# Patient Record
Sex: Male | Born: 1940
Health system: Southern US, Community
[De-identification: ages and names within clinical notes are randomized; demographics above are authoritative.]

## PROBLEM LIST (undated history)

## (undated) DIAGNOSIS — E7800 Pure hypercholesterolemia, unspecified: Secondary | ICD-10-CM

## (undated) DIAGNOSIS — I10 Essential (primary) hypertension: Secondary | ICD-10-CM

## (undated) DIAGNOSIS — Z87442 Personal history of urinary calculi: Secondary | ICD-10-CM

## (undated) DIAGNOSIS — N183 Chronic kidney disease, stage 3 unspecified: Secondary | ICD-10-CM

## (undated) DIAGNOSIS — K5790 Diverticulosis of intestine, part unspecified, without perforation or abscess without bleeding: Secondary | ICD-10-CM

## (undated) DIAGNOSIS — J189 Pneumonia, unspecified organism: Secondary | ICD-10-CM

## (undated) DIAGNOSIS — R739 Hyperglycemia, unspecified: Secondary | ICD-10-CM

## (undated) DIAGNOSIS — G910 Communicating hydrocephalus: Secondary | ICD-10-CM

## (undated) DIAGNOSIS — G4730 Sleep apnea, unspecified: Secondary | ICD-10-CM

## (undated) DIAGNOSIS — R4182 Altered mental status, unspecified: Secondary | ICD-10-CM

## (undated) DIAGNOSIS — I480 Paroxysmal atrial fibrillation: Secondary | ICD-10-CM

## (undated) DIAGNOSIS — I509 Heart failure, unspecified: Secondary | ICD-10-CM

## (undated) HISTORY — DX: Chronic kidney disease, stage 3 (moderate): N18.3

## (undated) HISTORY — PX: EYE SURGERY: SHX253

## (undated) HISTORY — DX: Pure hypercholesterolemia, unspecified: E78.00

## (undated) HISTORY — DX: Hyperglycemia, unspecified: R73.9

## (undated) HISTORY — DX: Personal history of urinary calculi: Z87.442

## (undated) HISTORY — PX: TONSILLECTOMY: SUR1361

## (undated) HISTORY — DX: Chronic kidney disease, stage 3 unspecified: N18.30

## (undated) HISTORY — DX: Heart failure, unspecified: I50.9

## (undated) HISTORY — DX: Diverticulosis of intestine, part unspecified, without perforation or abscess without bleeding: K57.90

## (undated) HISTORY — DX: Communicating hydrocephalus: G91.0

## (undated) HISTORY — DX: Paroxysmal atrial fibrillation: I48.0

## (undated) HISTORY — DX: Essential (primary) hypertension: I10

## (undated) HISTORY — DX: Altered mental status, unspecified: R41.82

---

## 1979-11-25 HISTORY — PX: SHOULDER SURGERY: SHX246

## 2007-07-02 ENCOUNTER — Ambulatory Visit: Payer: Self-pay | Admitting: Cardiology

## 2007-07-06 ENCOUNTER — Ambulatory Visit: Payer: Self-pay | Admitting: Cardiology

## 2007-07-06 ENCOUNTER — Inpatient Hospital Stay (HOSPITAL_BASED_OUTPATIENT_CLINIC_OR_DEPARTMENT_OTHER): Admission: RE | Admit: 2007-07-06 | Discharge: 2007-07-06 | Payer: Self-pay | Admitting: Cardiology

## 2007-07-14 ENCOUNTER — Ambulatory Visit: Payer: Self-pay | Admitting: Cardiology

## 2007-08-10 ENCOUNTER — Encounter: Payer: Self-pay | Admitting: Cardiology

## 2007-08-10 ENCOUNTER — Ambulatory Visit: Payer: Self-pay

## 2007-08-10 ENCOUNTER — Ambulatory Visit: Payer: Self-pay | Admitting: Cardiology

## 2007-08-12 ENCOUNTER — Ambulatory Visit: Payer: Self-pay | Admitting: Internal Medicine

## 2007-09-20 ENCOUNTER — Ambulatory Visit: Payer: Self-pay | Admitting: Cardiology

## 2007-11-04 ENCOUNTER — Ambulatory Visit: Payer: Self-pay | Admitting: Cardiology

## 2007-11-04 ENCOUNTER — Ambulatory Visit: Payer: Self-pay

## 2008-01-19 ENCOUNTER — Ambulatory Visit: Payer: Self-pay | Admitting: Cardiology

## 2008-01-31 ENCOUNTER — Ambulatory Visit: Payer: Self-pay | Admitting: Internal Medicine

## 2008-01-31 ENCOUNTER — Ambulatory Visit: Payer: Self-pay

## 2008-01-31 ENCOUNTER — Encounter: Payer: Self-pay | Admitting: Cardiology

## 2008-06-20 ENCOUNTER — Ambulatory Visit: Payer: Self-pay | Admitting: Cardiology

## 2008-11-30 ENCOUNTER — Ambulatory Visit: Payer: Self-pay | Admitting: Cardiology

## 2009-02-23 DIAGNOSIS — E7800 Pure hypercholesterolemia, unspecified: Secondary | ICD-10-CM

## 2009-02-23 DIAGNOSIS — I428 Other cardiomyopathies: Secondary | ICD-10-CM

## 2009-02-23 DIAGNOSIS — Z8679 Personal history of other diseases of the circulatory system: Secondary | ICD-10-CM | POA: Insufficient documentation

## 2009-02-23 DIAGNOSIS — I509 Heart failure, unspecified: Secondary | ICD-10-CM | POA: Insufficient documentation

## 2009-02-27 ENCOUNTER — Encounter: Payer: Self-pay | Admitting: Cardiology

## 2009-02-28 ENCOUNTER — Ambulatory Visit: Payer: Self-pay | Admitting: Cardiology

## 2009-02-28 ENCOUNTER — Encounter: Payer: Self-pay | Admitting: Cardiology

## 2009-04-09 ENCOUNTER — Ambulatory Visit: Payer: Self-pay | Admitting: Cardiology

## 2009-08-07 ENCOUNTER — Ambulatory Visit: Payer: Self-pay | Admitting: Cardiology

## 2009-12-20 ENCOUNTER — Encounter: Payer: Self-pay | Admitting: Cardiology

## 2010-08-22 ENCOUNTER — Ambulatory Visit: Payer: Self-pay | Admitting: Cardiology

## 2010-08-22 ENCOUNTER — Ambulatory Visit: Payer: Self-pay

## 2010-08-22 ENCOUNTER — Encounter: Payer: Self-pay | Admitting: Cardiology

## 2010-08-22 ENCOUNTER — Ambulatory Visit (HOSPITAL_COMMUNITY): Admission: RE | Admit: 2010-08-22 | Discharge: 2010-08-22 | Payer: Self-pay | Admitting: Cardiology

## 2010-11-24 LAB — HM DIABETES EYE EXAM

## 2010-12-24 NOTE — Assessment & Plan Note (Signed)
Summary: ROV   Visit Type:  1 year follow up Primary Furkan Keenum:  kinlaw  CC:  follow up echo done today.  History of Present Illness: Reviewed with patient about echo.  Of note, he does not snore unless he gains alot of weight.  Has a large gall stone, but they have recommended a medical approach.  No chest pain.  Weight has been up and down.    Current Medications (verified): 1)  Simvastatin 40 Mg Tabs (Simvastatin) .... Take One Tablet By Mouth Daily At Bedtime 2)  Multivitamins  Tabs (Multiple Vitamin) .... Take 1 Tablet By Mouth Once A Day 3)  Vitamin E 400 Unit Caps (Vitamin E) .... Take 1 Capsule By Mouth Once A Day 4)  Vitamin C 500 Mg Tabs (Ascorbic Acid) .... Take 1 Tablet By Mouth Once A Day 5)  Bilberry Extract 1.000mg  .... Once Daily 6)  Vitamin A&d Iu .... Once Daily 7)  Carvedilol 25 Mg Tabs (Carvedilol) .... Take One Tablet By Mouth Twice A Day 8)  Januvia 100 Mg Tabs (Sitagliptin Phosphate) .... 1/2 Tablet By Mouth Daily 9)  Glimepiride 1 Mg Tabs (Glimepiride) .... Take 1 Tablet By Mouth Two Times A Day 10)  Trilipix 135 Mg Cpdr (Choline Fenofibrate) .... Take 1 Capsule By Mouth Once A Day  Allergies: 1)  ! Penicillin 2)  ! Asa  Vital Signs:  Patient profile:   70 year old male Height:      71 inches Weight:      253 pounds BMI:     35.41 Pulse rate:   91 / minute Pulse rhythm:   regular Resp:     18 per minute BP sitting:   136 / 80  (left arm) Cuff size:   large  Vitals Entered By: Sidney Ace (August 22, 2010 2:19 PM)  Physical Exam  General:  Well developed, well nourished, in no acute distress.  Moderately obese at baseline. Head:  normocephalic and atraumatic Eyes:  PERRLA/EOM intact; conjunctiva and lids normal. Lungs:  Clear bilaterally to auscultation and percussion. Heart:  PMI slightly displaced.  Normal S  without murmur.  Pos S4.  Paradoxical splitting of S2.   Abdomen:  Bowel sounds positive; abdomen soft and non-tender without masses,  organomegaly, or hernias noted. No hepatosplenomegaly.  No RUQ tenderness at present.  Msk:  Back normal, normal gait. Muscle strength and tone normal. Pulses:  pulses normal in all 4 extremities Extremities:  No clubbing or cyanosis. Neurologic:  Alert and oriented x 3.   EKG  Procedure date:  08/22/2010  Findings:      NSR.  LBBB.   Echocardiogram  Procedure date:  08/22/2010  Findings:      Left ventricle: The cavity size was mildly dilated. Wall thickness     was increased in a pattern of mild LVH. Systolic function was     severely reduced. The estimated ejection fraction was in the range     of 20% to 25%. There is akinesis of the anteroseptal myocardium.     There is akinesis of the apical myocardium. There is akinesis of the     inferior myocardium. Doppler parameters are consistent with high     ventricular filling pressure.     Impressions:            - Possible apical thrombus.  Impression & Recommendations:  Problem # 1:  CARDIOMYOPATHY (ICD-425.4) Reviewed in detail with patient.  His functional status is excellent.  I also reviewed  with Dr. Stanford Breed his echo study.  His LV is clearly reduced.  No apical mural thrombus is suggested by 2D at baseline.  ONly with contrast, in one view, is there even a possible suggestion of this, and Dr. Stanford Breed thought it also could be trabeculation causing the one view borderline abnormality.  Would not favor anticoagulation at present.  I think it would be prudent to consider adding an ACE or ARB, and I will discuss this with him when I call him regarding his echo study.  I would also like to review with Dr. Lovena Le discussions regarding NICM and ICD.   His updated medication list for this problem includes:    Carvedilol 25 Mg Tabs (Carvedilol) .Marland Kitchen... Take one tablet by mouth twice a day  Orders: EKG w/ Interpretation (93000)  Problem # 2:  HYPERTENSION, HX OF (ICD-V12.50) Controlled, although I would like to add an ACE  inhibitor.  Patient previously had some issues relative to this.  Orders: EKG w/ Interpretation (93000)  Problem # 3:  DIABETES MELLITUS (ICD-250.00) currently under close management by Dr. Ardeen Jourdain.  His updated medication list for this problem includes:    Januvia 100 Mg Tabs (Sitagliptin phosphate) .Marland Kitchen... 1/2 tablet by mouth daily    Glimepiride 1 Mg Tabs (Glimepiride) .Marland Kitchen... Take 1 tablet by mouth two times a day  Patient Instructions: 1)  Your physician recommends that you continue on your current medications as directed. Please refer to the Current Medication list given to you today. 2)  Your physician wants you to follow-up in:  6 MONTHS.  You will receive a reminder letter in the mail two months in advance. If you don't receive a letter, please call our office to schedule the follow-up appointment.  Appended Document: ROV Chart reviewed.  Admitted to [facility] with dehydration on ARB  (Avapro), and Cr was elevated.  Eventually stopped.  Could consider retry, but am reluctant.  Will cotinue to follow.  TS

## 2010-12-24 NOTE — Letter (Signed)
Summary: [facility]  [facility] Note   Imported By: Sallee Provencal 05/14/2010 13:55:08  _____________________________________________________________________  External Attachment:    Type:   Image     Comment:   External Document

## 2011-03-25 ENCOUNTER — Encounter: Payer: Self-pay | Admitting: Cardiology

## 2011-03-26 ENCOUNTER — Ambulatory Visit (INDEPENDENT_AMBULATORY_CARE_PROVIDER_SITE_OTHER): Payer: Medicare Other | Admitting: Cardiology

## 2011-03-26 ENCOUNTER — Encounter: Payer: Self-pay | Admitting: Cardiology

## 2011-03-26 DIAGNOSIS — E7800 Pure hypercholesterolemia, unspecified: Secondary | ICD-10-CM

## 2011-03-26 DIAGNOSIS — I428 Other cardiomyopathies: Secondary | ICD-10-CM

## 2011-03-26 MED ORDER — CARVEDILOL 25 MG PO TABS: 25.0000 mg | ORAL_TABLET | Freq: Two times a day (BID) | ORAL | Status: DC

## 2011-03-26 NOTE — Patient Instructions (Addendum)
Your physician recommends that you schedule a follow-up appointment in: 6 months with Dr. Lia Foyer  Your physician has requested that you have an echocardiogram. Echocardiography is a painless test that uses sound waves to create images of your heart. It provides your doctor with information about the size and shape of your heart and how well your heart's chambers and valves are working. This procedure takes approximately one hour. There are no restrictions for this procedure. To be done in 6 months

## 2011-04-03 NOTE — Assessment & Plan Note (Signed)
Has reduced overall LV function.  At the time of his last echo, his EF was 20-25%.  He had definity done and had trabeculation versus apical thrombus, but I reveiwed with Dr. Stanford Breed in great detail, and there was no clear  evidence of LV mural thrombus seen.  Of note, I spoke with EP as well  Caryl Comes).  He previously was turned down for ICD because of his functional status.  We discussed various options and approaches.  One would be to consider a CPX test to truly test his "asymptomatic" status. That would determine eligibility.    We also noted his issue with regard to meds.  He was taken off of ARBs in the past.  He had diahreaa, became dehydrated, and developed what was thought to be ARF related to this.  He has remained stable on these meds.    We will repeat echo in the fall and discuss with the patient options regarding ARB and CPX testing.

## 2011-04-03 NOTE — Progress Notes (Signed)
HPI:  Mr. Callis is in for follow up.  He continues to not have any significant symptoms despite his reduced EF.  He walks without difficulty.  He feels great.  Cannot lose weight.  His wife says he does not snore at night.    Current Outpatient Prescriptions  Medication Sig Dispense Refill  . Ascorbic Acid (VITAMIN C) 500 MG CAPS Take 1 capsule by mouth daily.        . Bilberry, Vaccinium myrtillus, (BILBERRY EXTRACT PO) Take by mouth. 1.000mg  once a day       . carvedilol (COREG) 25 MG tablet Take 1 tablet (25 mg total) by mouth 2 (two) times daily with a meal.  180 tablet  3  . Choline Fenofibrate (TRILIPIX) 135 MG capsule Take 135 mg by mouth daily.        Marland Kitchen glimepiride (AMARYL) 1 MG tablet Take 1 mg by mouth 2 (two) times daily.        . Multiple Vitamin (MULTIVITAMIN) tablet Take 1 tablet by mouth daily.        . simvastatin (ZOCOR) 40 MG tablet Take 40 mg by mouth at bedtime.        . sitaGLIPtan (JANUVIA) 50 MG tablet Take 50 mg by mouth daily.        . vitamin E 400 UNIT capsule Take 400 Units by mouth daily.        . Vitamins A & D (VITAMIN A & D) 5000-400 UNITS CAPS Take by mouth daily.          Allergies  Allergen Reactions  . Aspirin   . Penicillins     Past Medical History  Diagnosis Date  . CHF (congestive heart failure)   . Hypertension   . Diabetes mellitus   . Hypercholesterolemia   . Cardiomyopathy     Past Surgical History  Procedure Date  . Shoulder surgery 1981    left. Muscle reattachment  . Tonsillectomy     Family History  Problem Relation Age of Onset  . Other Father 86    unknown cause  . Other Mother 62    unknown cause  . Heart failure Other     maternal grandmother  . Diabetes type II Sister 12    History   Social History  . Marital Status: Married    Spouse Name: N/A    Number of Children: 1  . Years of Education: N/A   Occupational History  . retired    Social History Main Topics  . Smoking status: Former Smoker    Types:  Cigarettes  . Smokeless tobacco: Not on file  . Alcohol Use: No  . Drug Use: No  . Sexually Active: Not on file   Other Topics Concern  . Not on file   Social History Narrative  . No narrative on file    ROS: Please see the HPI.  All other systems reviewed and negative.  PHYSICAL EXAM:  BP 126/76  Pulse 78  Ht 5\' 11"  (1.803 m)  Wt 256 lb 6.4 oz (116.302 kg)  BMI 35.76 kg/m2  General: Well developed, well nourished, in no acute distress. Head:  Normocephalic and atraumatic. Neck: no JVD Lungs: Clear to auscultation and percussion. Heart: Normal S1 and S2.  No murmurs.  Pos S4 gallop.  No S3 gallop.   Abdomen:  Normal bowel sounds; soft; non tender; no organomegaly Pulses: Pulses normal in all 4 extremities. Extremities: No clubbing or cyanosis. No edema. Neurologic: Alert and  oriented x 3.  EKG:  NSR.  LBBB.  ASSESSMENT AND PLAN:

## 2011-04-03 NOTE — Assessment & Plan Note (Signed)
Followed by primary care, and also at the [facility] hospital with regard to labs.

## 2011-04-08 NOTE — Assessment & Plan Note (Signed)
[facility]                            CARDIOLOGY OFFICE NOTE   NAME:MCDONALDMarland Kitchen TAMMY SANTIS                 MRN:          [id number]  DATE:11/30/2008                            DOB:          12/25/1940    Mr. Nakazawa is in for a followup visit.  To briefly summarize, he is  doing reasonably well, but he has not been exercising much.  Moreover,  he has not been as diligent about his diet as he had been in the past.  As a result, we do have some concerns about all of this.  He has also  seen Dr. Lovena Le in electrophysiologic followup in March 2009.  At that  time, with his class I symptoms, he did not feel he was a candidate for  a defibrillator therapy.  His ejection fraction rate remains in the  range of 25-30%.  Since February of last year, the patient is up about  20 pounds.  He denies any progressive shortness of breath, but his wife  thinks he is slipping backwards.   CURRENT MEDICATIONS:  1. Carvedilol 25 mg p.o. b.i.d.  2. Janumet 50/1000 b.i.d.  3. Avapro 150 mg daily.  4. Simvastatin 40 mg daily.  5. Multivitamin daily.  6. Vitamin E daily.  7. Vitamin C daily.  8. Bilberry extract daily.  9. Vitamin A and D.  10.Omega-3 fish oil 2 tablets daily.   PHYSICAL EXAMINATION:  He is alert and oriented, in no distress.  Blood  pressure is 134/70, pulse is 95, weight is 249 pounds.  Jugular veins  are not significantly distended.  Lung fields are clear to auscultation  and percussion.  The PMI is nondisplaced.  There is S4 gallop.  No  extremity edema noted.   Electrocardiogram demonstrates normal sinus rhythm, left bundle-branch  block.   IMPRESSION:  1. Nonischemic cardiomyopathy, undetermined etiology.  2. Hypercholesterolemia, on lipid-lowering therapy.  3. Non-insulin-dependent diabetes mellitus.   RECOMMENDATIONS:  1. Continued followup with Dr. Fuller Canada.  2. I had spoken with the patient about his weight and how it impacts   his long-term health,  both his nonischemic cardiomyopathy, as well      as his diabetes.  He will try to get re-focused on his diet.   PLAN:  Return to clinic in approximately 2-3 months at which time, a  repeat 2-D echocardiogram will be obtained.     Loretha Brasil. Lia Foyer, MD, [facility]  Electronically Signed    TDS/MedQ  DD: 12/01/2008  DT: 12/02/2008  Job #: [id number]

## 2011-04-08 NOTE — Cardiovascular Report (Signed)
NAME:  Justin Hill, Justin Hill NO.:  [id number]   MEDICAL RECORD NO.:  [id number]          PATIENT TYPE:  OIB   LOCATION:  1966                         FACILITY:  [facility]   PHYSICIAN:  Loretha Brasil. Lia Foyer, MD, FACCDATE OF BIRTH:  1941-05-29   DATE OF PROCEDURE:  07/06/2007  DATE OF DISCHARGE:                            CARDIAC CATHETERIZATION   INDICATIONS:  Mr. Ciak is a very pleasant 70 year old gentleman who  presents with recent diagnosis of cardiomyopathy.  The exact etiology  was unclear.  Dr. Geraldo Pitter performed a radionuclide imaging study which  demonstrated no ischemia and an ejection fraction of 23%.  The patient  was started on good medical regimen, and subsequently sent to [facility]  for evaluation for cardiac catheterization.   PROCEDURE:  1. Left and right heart catheterization.  2. Selective coronary arteriography.  3. Selective left ventriculography.  4. Aortic root aortography.   DESCRIPTION OF PROCEDURE:  The patient was brought to the  catheterization laboratory and prepped and draped in the usual fashion.  Through an anterior puncture the right femoral vein was easily entered.  A 7-French sheath was placed.  A Swan-Ganz catheter was then taken up to  the superior vena cava where a saturation was obtained.  Following this,  serial right heart pressures were measured.  Pulmonary artery saturation  was then obtained.  Thermodilution cardiac outputs were then performed  without complication.  We then used a Smart needle to gain access to the  right femoral artery and a 4-French sheath was placed.  A pigtail  catheter was then quickly taken up into the left ventricle where  pressures were measured.  Simultaneous wedge and LV was then quickly  measured.  Following this, ventriculography was performed in the RAO  projection.  Following a pressure pullback, aortography was then  performed to rule out significant aortic regurgitation since there was  some  a aortic regurgitation on the angiographic study.  Following this,  coronary arteriography was performed with standard Judkins catheters.  A  Noto catheter was used to engage the right coronary artery.  There were  no complications.  He was taken to the holding area in satisfactory  clinical condition.   HEMODYNAMIC DATA:  1. Right atrial 9.  2. RV 32/70.  3. Pulmonary artery 32/19, mean 24.  4. Pulmonary capillary wedge 17.  5. Aortic 129/74, mean 97.  6. LV 129/69.  7. Fick cardiac output 4.1 L per minute.  8. Fick cardiac index 1.77 L per minute per meter squared.  9. Thermodilution cardiac output 4.1 L per minute.  10.Thermodilution cardiac index 1.77 L per minute per meter squared.  11.Superior cava saturation 61%.  12.Pulmonary artery saturation 63%.  13.Aortic saturation 95%.   ANGIOGRAPHIC DATA:  1. Ventriculography was done in the RAO projection.  Estimated      ejection fraction would be in the 20-25% range.  There did not      appear to be significant mitral regurgitation noted.  The pattern      was one of global hypokinesis.  2. Aortic root aortography did not reveal significant aortic  regurgitation.  There was no obvious evidence of dissection.  3. The left main was a large-caliber vessel and free of critical      disease.  It supplied an LAD and a dominant circumflex.  4. The left anterior descending artery courses all the way to the      apex.  There is minor luminal irregularity in the LAD proximally      with minimal ostial tapering of the first diagonal.  The second      diagonal is without critical narrowing.  The distal LAD wrapped the      apex and supplied the distal apical segment.  5. The circumflex is a dominant vessel.  There is a smaller marginal      branch and then two very large posterolateral branches.  There is a      posterior descending branch that courses around towards the      inferior wall, and there is some perhaps mild irregularity  in the      mid portion of this, but it does not appear to exceed 50%.  6. The right coronary artery is a nondominant vessel without      significant focal narrowing.   CONCLUSIONS:  1. Nonischemic cardiomyopathy with improved hemodynamics from the      original presentation at [facility].  2. No significant coronary obstruction.  3. No step-up between the superior vena cava and pulmonary artery.  4. Borderline cardiac output.   DISPOSITION:  The patient will continue to be treated with a medical  regimen.  I have spoken with Dr. Lovena Le.  The patient will get followed  up carefully.  Followup echocardiography will be done 2 months.  If it  is not improved, he would be a candidate for ICD with biventricular  pacing and implantable defibrillator.      Loretha Brasil. Lia Foyer, MD, [facility]  Electronically Signed     TDS/MEDQ  D:  07/06/2007  T:  07/07/2007  Job:  [id number]   cc:   Reita Cliche. Revankar, M.D.  Fuller Canada, M.D.  CV Laboratory  [facility]

## 2011-04-08 NOTE — Assessment & Plan Note (Signed)
[place]                            CARDIOLOGY OFFICE NOTE   NAME:Justin Hill, Arquilla                 MRN:          [id number]  DATE:07/02/2007                            DOB:          [birth year]    REFERRING PHYSICIAN:  Andria Frames, M.D.   CHIEF COMPLAINT:  Shortness of breath.   HISTORY OF PRESENT ILLNESS:  [name] is a very pleasant, 70-year-  old gentleman who I am familiar with recently through his son.  He has a  history of hypertension and diabetes and has been followed by Dr.  Ardeen Jourdain.  Over the past 2 months, he has had increasing episodes of  shortness of breath associated with episodes of diaphoresis.  He has  also had a worsening cough.  He initially blamed his cough on a variety  of medications and some of these medication changes have been effective,  however, the dyspnea became much worse and he was subsequently admitted  to [facility].  The patient is active and does a fair amount of  outside work.  He unfortunately had two brief episodes of fainting,  neither of which occurred during time that he was doing any significant  activity.  He denies having any significant chest pain.  He was admitted  to the hospital at [facility] and underwent a workup.  This  revealed an echocardiogram that demonstrated global hypokinesis with  some mitral, tricuspid and aortic regurgitation.  Ejection fraction was  significantly depressed.  In addition, the patient underwent an  adenosine Cardiolite interpreted by Dr. Geraldo Pitter.  This revealed an  ejection fraction of 23%.  There was a dilated left ventricular cavity.  Of note, pulmonary artery pressures were thought by echocardiogram to be  elevated at 55 mm.  The patient was subsequently started on low-dose  Coreg.  He was also noted to have an elevated TSH.  Synthroid was then  initiated.  He had been on Actos and this was subsequently discontinued.  The patient did well at that  time and has subsequently been discharged  from the hospital.   PAST MEDICAL HISTORY:  1. Diabetes.  2. He has been thought to have some perhaps chronic obstructive      pulmonary disease.  3. Muscle reattachment to the left shoulder and a tonsillectomy as a      child.  4. There has also been a questionable history of atrial fibrillation      in the past.   ALLERGIES:  PENICILLIN AND ASPIRIN.   MEDICATIONS:  1. Janumet 500/1000 b.i.d.  2. Avapro 150 mg daily.  3. Simvastatin 40 mg daily.  4. Levothyroxine 25 mg daily.  5. Carvedilol 6.25 mg one-half tablet p.o. b.i.d.  6. Multivitamin daily.  7. Vitamin E daily.  8. Vitamin C daily.  9. Vitamin A and D.  10.Omega-3 fish oil.   FAMILY HISTORY:  Father died at 61 of unknown causes.  Mother died at 61  of unknown causes.  His maternal grandmother did die of congestive heart  failure.  He has a sister who is 27 and has type 2 diabetes and is  overweight.   REVIEW OF SYSTEMS:  The patient has had some dizziness intermittently.  He wears glasses.  He had a glaucoma test in 2007.  His last dental exam  was 6 months ago.  Had a TB test very long ago.  His appetite has been  good.  Flexible sigmoidoscopy 3 years ago.  He gets up twice at night to  go to the bathroom.  His weight has fluctuated from 150-250.   SOCIAL HISTORY:  The patient is retired.  He quit smoking more than 25  years ago.  He is married and has one child.  His son is a Theme park manager in  [place].   PHYSICAL EXAMINATION:  GENERAL:  He is an overweight, alert and oriented  gentleman in no acute distress.  VITAL SIGNS:  Height 5 feet 11 inches, weight 250 pounds, blood pressure  128/78 in the right and left arms.  NECK:  He has a big neck, but supple.  There are no carotid bruits.  The  jugular veins are not substantially distended.  LUNGS:  Clear to auscultation and percussion.  CARDIAC:  PMI cannot be really well-palpated.  There is a normal first  heart  sound and some paradoxical splitting of the second heart sound.  There is a minimal systolic ejection murmur, but no diastolic murmur is  appreciated.  I did not appreciate a rub.  ABDOMEN:  Obese.  EXTREMITIES:  Distal pulses are intact.  Do not reveal significant  edema.  NEUROLOGIC:  Nonfocal.   LABORATORY DATA AND X-RAY FINDINGS:  EKG obtained in the office today  reveals left bundle branch block with normal sinus rhythm.   IMPRESSION:  1. Dilated cardiomyopathy of uncertain etiology.  2. Non-insulin dependent diabetes mellitus.  3. Hypertension.  4. Negative myocardial perfusion imaging for ischemia, but with      significant left ventricular dysfunction.  5. Recent episode of prescynope.  6. Penicillin allergy.  7. Aspirin intolerance.  8. Elevated pulmonary artery pressures of uncertain cause.   RECOMMENDATIONS:  At the present time, I think it would be appropriate  to consider left and right heart catheterization.  He has significant  left ventricular dysfunction of unknown etiology.  This could represent  an ischemic or nonischemic cardiomyopathy.  If it is nonischemic, we  will also get an idea if the pulmonary artery pressure elevation is left-  sided or of primary pulmonary etiology.  Consideration of CT angiography  of the chest for exclusion of pulmonary emboli would also be  appropriate, although his history does not strongly suggest this.  The  significant left ventricular dysfunction suggests a primary cardiac  etiology for his symptoms.  The use of carvedilol is appropriate.  He  may have to restrain from a diabetic standpoint with the use of TZD  drugs in the long haul.  I will communicate the information to Dr.  Ardeen Jourdain after we have completed this.  Risks, benefits and alternatives  have been explained to the patient.  He is agreeable to proceed.     Loretha Brasil. Lia Foyer, MD, [facility]  Electronically Signed    TDS/MedQ  DD: 07/06/2007  DT: 07/06/2007  Job #:  [id number]

## 2011-04-08 NOTE — Assessment & Plan Note (Signed)
[facility] HEALTHCARE                            CARDIOLOGY OFFICE NOTE   NAME:Justin Hill, Justin Hill                 MRN:          [id number]  DATE:01/19/2008                            DOB:          03/17/1941    Justin Hill is in for followup.  In general, he is doing the best he  has done quite some time.  He is tolerating his medicines without  difficulty.  He is scheduled for a followup echo and followup with Dr.  Lovena Le in two weeks' time.   CURRENT MEDICATIONS:  1. Carvedilol 25 mg p.o. b.i.d.  2. Janumet 500/1000 b.i.d.  3. Avapro 150 daily.  4. Simvastatin 40 daily.  5. Multivitamins daily.  6. Vitamin E.  7. Vitamin C.  8. Bilberry.  9. Vitamin A and D.  10.Omega fish oil.   PHYSICAL EXAMINATION:  VITAL SIGNS:  Blood pressure is 117/61, pulse is  81.  LUNGS:  The lung fields are clear.  HEART:  He has an S4 gallop.  No other changes noted.   In reviewing his overall clinical symptomatology, the patient now has  class I symptoms.  He is scheduled to see Dr. Lovena Le in near followup.  I plan to see him back in followup in about 2-3 months.  Should he have  any problems in the interim, he is to contact me.     Loretha Brasil. Lia Foyer, MD, [facility]  Electronically Signed    TDS/MedQ  DD: 02/14/2008  DT: 02/14/2008  Job #: [id number]   cc:   Fuller Canada, M.D.

## 2011-04-08 NOTE — Assessment & Plan Note (Signed)
 HEALTHCARE                         ELECTROPHYSIOLOGY OFFICE NOTE   NAME:MCDONALDMarland Kitchen NADIM ANDRETTA                 MRN:          [id number]  DATE:01/31/2008                            DOB:          [birth year]    Mr. Herskovitz returns today for follow-up.  He is a patient of Dr.  Lia Foyer.  He is a 70 year old male with history of a nonischemic  cardiomyopathy, really class I heart failure symptoms, who I saw back in  September.  At that time we decided that medical therapy alone and a  period of watchful waiting were most reasonable because his heart  failure symptoms were class I.  Repeat echo initially showed an EF that  had improved slightly, perhaps to 35%.  He returns today for follow-up.  He is exercising regularly.  He has no specific complaints.  Today he  denies chest pain or shortness of breath or peripheral edema.   MEDICATIONS:  1. Janumet 03/999 mg twice daily.  2. Avapro 150 mg a day.  3. Simvastatin 40 mg a day.  4. Multiple vitamin.  5. Carvedilol 25 mg twice daily.   PHYSICAL EXAM:  He is a pleasant, well-appearing man in no distress.  Blood pressure was 120/74, the pulse 64 and regular, the respirations  were 18.  Weight was 234 pounds.  NECK:  No jugular venous distention.  LUNGS:  Clear bilaterally to auscultation.  There were no wheezes, rales  or rhonchi present.  There was no increased work of breathing.  CARDIOVASCULAR:  Regular rate and rhythm with a normal S1 and S2.  The  PMI was enlarged and laterally displaced.  ABDOMINAL:  Obese, nontender, nondistended.  There was no organomegaly.  Bowel sounds present.  No rebound or guarding.  EXTREMITIES:  No cyanosis, clubbing or edema.  Pulses were 2+ and  symmetric.   EKG from February demonstrates sinus rhythm with left bundle branch  block.   IMPRESSION:  1. Nonischemic cardiomyopathy.  2. Congestive heart failure, class I.  3. Left bundle branch block.   DISCUSSION:   Overall Mr. Kirkeby is stable.  He has had no arrhythmias  and no symptoms of syncope, and his heart failure is class I at present.  A 2-D echo was done today, the results of which are pending, but  irregardless of the results, I think he is not a candidate for  defibrillator therapy based on his class I heart failure symptoms.  I  will plan to see him back on a p.r.n. basis.  I have gone over today  with the patient and his family the importance of regular daily exercise  and talked about the specific types of exercise which are most  important.  Also we  talked about the importance of maintaining a low-sodium diet and taking  his medications as he is supposed to.  I will see him back in the office  on a p.r.n. basis.     Champ Mungo. Lovena Le, MD  Electronically Signed    GWT/MedQ  DD: 01/31/2008  DT: 02/01/2008  Job #: [id number]   cc:   Andria Frames, MD

## 2011-04-08 NOTE — Letter (Signed)
August 12, 2007    Loretha Brasil. Lia Foyer, MD, [place] [place], [place] [address]   RE:  OCTAVIA, BRONKEMA  MRN:  [id number]  /  DOB:  Oct 15, 1941   Dear Gershon Mussel:   Thank you for referring Dareen Piano for consideration for ICD  implantation for treatment of his benign ischemic cardiomyopathy.  As  you know, he is a very pleasant middle-aged male with a history remotely  of congestive heart failure and left bundle branch block who had no  documented coronary artery disease by catheterization.  He also has  diabetes and hypertension.  The patient has been on medical therapy for  his heart failure now for several weeks with his diagnosis initially  being made back in the summer time.  He has tolerated beta blockers and  ARB very nicely.  The patient denies chest pain or shortness of breath  and is now back retired and doing whatever he wants without functional  limitation.  He has had no syncope.  He denies rapid palpitations.  He  is here today for initial evaluation.   EXAM:  Basically unremarkable, except as you would expect from someone  with obesity and severe left ventricular dysfunction.   His EKG demonstrates sinus rhythm with left bundle branch block.   I have discussed treatment options with the patient and his spouse.  Because his heart failure is class I, I would recommend a period of  watchful waiting with continuation of his beta blocker and his ARB drug.  I would recommend repeat 2D echo in approximately 5 or 6 months to see  if there has been improvement in his left ventricular function.  If his  heart failure symptoms worsen, then consideration of prophylactic ICD  insertion would be certainly considered.  At the  present time, I think his class I status obligates US not to recommend a  defibrillator.  We will see him back in the office in several months.  Once again, thanks Tom for referring Mr. Mayorga for EP evaluation and  I will discuss  with you the timing of the repeat 2D echo.    Sincerely,      Champ Mungo. Lovena Le, MD  Electronically Signed    GWT/MedQ  DD: 08/12/2007  DT: 08/12/2007  Job #: [id number]   CC:    Dr. Fuller Canada

## 2011-04-08 NOTE — Letter (Signed)
November 04, 2007    Andria Frames, MD  [address]  [place], [place]  [address]   RE:  [name], Hill  MRN:  [id number]  /  DOB:  03/24/1941   Dear Justin Hill:   I had the pleasure of seeing Justin Hill in the office today in a  followup visit.  He seems to be getting along extremely well.  He has  been exercising regularly and his wife thinks he is significantly  improved, fortunately so does the patient.  We had him undergo a repeat  echocardiogram today.  The echocardiogram demonstrated overall left  ventricular function in the range of 30-35%.  There was diffuse left  ventricular hypokinesis but this was improved from the previous visit.  In general, he feels well.   His medications include:  1. Janumet 500/1,000 b.i.d.  2. Avapro 150 mg daily.  3. Simvastatin 40 mg daily.  4. Multivitamin daily.  5. Vitamin E daily.  6. Vitamin C daily.  7. Mulberry extract daily.  8. Vitamin A and D daily.  9. Omega-3 fish oil daily.  10.Carvedilol 12.5 mg, one tablet p.o. b.i.d.   PHYSICAL EXAMINATION:  GENERAL:  He is alert and oriented in no  distress.  VITAL SIGNS:  The blood pressure is 116/66, the pulse is 84.  LUNGS:  Fields are clear.  CARDIAC:  Rhythm is regular.  There is an S4 gallop.  EXTREMITIES:  Do not reveal significant edema.   I am pleased with how the patient is doing.  He is scheduled to be seen  back in followup for Dr. Cristopher Peru for continued EP followup.  We  will also see the patient in followup and I will send you notes at the  time that I see him.   I think he should continue on the combination beta-blockade as well as  angiotensin receptor blocker, and a cholesterol lowering agent.   We will be happy to see him back at any time and in the absence of  problems, I will see him back in followup in 3 months.  Thanks for  allowing me to share in his care.    Sincerely,      Justin Hill. Lia Foyer, MD, [facility]  Electronically Signed    TDS/MedQ  DD:  11/05/2007  DT: 11/07/2007  Job #: [id number]

## 2011-04-08 NOTE — Assessment & Plan Note (Signed)
[facility] HEALTHCARE                            CARDIOLOGY OFFICE NOTE   NAME:Justin Hill Kitchen Justin Hill                 MRN:          [id number]  DATE:09/20/2007                            DOB:          Mar 31, 1941    The patient is in for follow-up.  In general, he is stable.  He has not  been having any symptoms at the present time.  He is in the cardiac  rehab program and they want to increase his heart rate.  They suggested  a stress test, although, the patient is relatively stable and has had a  treadmill in the past prior to rehab.  It would be appropriate to go  ahead and get his heart rate up.   MEDICATIONS:  1. Janumet 50/1000 mg b.i.d.  2. Avapro 150 mg daily.  3. Simvastatin 40 mg daily.  4. Multivitamin one daily.  5. Vitamin E 400 International Units daily.  6. Vitamin C 500 mg daily.  7. Bilberry extract 1000 mg daily.  8. Vitamin A and E daily.  9. Omega-3 Fish Oil daily.  10.Carvedilol 6.25 mg one tablet p.o. t.i.d.   PHYSICAL EXAMINATION:  VITAL SIGNS:  Blood pressure 128/70, pulse 80.  LUNGS:  Lung fields are clear.  HEART:  Largely unchanged.   IMPRESSION:  1. Nonischemic cardiomyopathy with significant reduction in overall      left ventricular function.  2. Hypercholesterolemia on lipid lowering therapy.  3. Non-insulin dependent diabetes mellitus.   PLAN:  1. The patient may go to cardiac rehab and increase his overall heart      rate.  2. He needs to have his lipids followed by Dr. Ardeen Jourdain at [facility] as well glucose control.  3. He will return to the cardiology clinic in another two months and      we will get an echo at that time.  He will eventually follow up      with the electrophysiologists who have seen him in consultation and      recommended a follow-up echo.     Loretha Brasil. Lia Foyer, MD, [facility]  Electronically Signed    TDS/MedQ  DD: 09/20/2007  DT: 09/21/2007  Job #: [id number]

## 2011-04-08 NOTE — Assessment & Plan Note (Signed)
[facility] HEALTHCARE                         ELECTROPHYSIOLOGY OFFICE NOTE   NAME:Justin Kitchen ERIQUE Hill                 MRN:          [id number]  DATE:08/12/2007                            DOB:          20-May-1941    Justin Hill is referred today by Loretha Brasil. Lia Foyer, MD, for evaluation  and consideration for ICD implantation.   HISTORY OF PRESENT ILLNESS:  The patient is a very pleasant 70 year old  man who has a history of congestive heart failure diagnosed  approximately 3 months ago.  There is a history of diabetes and obesity  and hypertension.  The patient states that he was in his usual state of  health until back in early June or late May of this past year when he  was out working very strenuously in the hot weather and using a tiller.  He became hot and weak and had to stop what he was doing.  After resting  a period of time he recovered and returned to his usual strenuous  activity.  There was no frank syncopal episode.  Over the next several  weeks he became increasingly short of breath and ultimately sought  medical attention, where he was found to have LV dysfunction with an EF  of 25% by echo.  Subsequent catheterization demonstrated no obstructive  coronary disease.  The patient was referred today for additional  evaluation and treatment.  The patient denies heart failure symptoms  presently now that he has been placed on beta blockers and Avapro.  He  is walking 3-4 miles daily and is gradually trying to lose some weight.  He has no real limitation to what he does.   MEDICATIONS:  1. Janumet 50/1000 mg.  2. Avapro 150 mg a day.  3. Simvastatin 40 mg a day.  4. Synthroid 25 mcg daily.  5. Multiple vitamins.  6. Carvedilol 12.5 mg twice daily.   SOCIAL HISTORY:  The patient denies tobacco or ethanol abuse.   FAMILY HISTORY:  Negative for premature coronary disease.   REVIEW OF SYSTEMS:  Otherwise unremarkable.   On exam, he is a  pleasant, obese, middle-aged man in no distress.  Blood pressure was 122/72, the pulse 62 and regular, respirations were  16, the weight was 243 pounds.  HEENT:  Normocephalic, atraumatic.  Pupils equal and round.  The  oropharynx is moist.  The sclerae were anicteric.  NECK:  No jugular venous distention, no thyromegaly.  Trachea was  midline.  The carotids were 2+ and symmetric.  LUNGS:  Clear bilaterally to auscultation.  No wheezes, rales or rhonchi  were present.  There was no increased work of breathing.  CARDIOVASCULAR:  A regular rhythm with normal S1 and S2.  The PMI was  enlarged and laterally displaced.  ABDOMEN:  Soft, nontender, nondistended, without organomegaly.  Bowel  sounds were present.  There was no rebound or guarding.  EXTREMITIES:  No cyanosis, clubbing or edema.  The pulses were 2+ and  symmetric.  NEUROLOGIC:  Alert and oriented x3 with cranial nerves intact.  Strength  is 5/5 and symmetric.   EKG demonstrated sinus rhythm with left bundle  branch block.   IMPRESSION:  1. Nonischemic cardiomyopathy on beta blockers and ARB.  2. Class I congestive heart failure symptoms.  3. Hypertension.  4. Diabetes.   DISCUSSION:  The patient's heart failure really is class I at the  present time and he has no functional limitation.  His EF is down at  25%.  He also has left bundle branch block.  I have recommended that the  patient undergo a period of watchful waiting and see whether not the  combination of exercise and medical therapy results in improvement of  his LV function.  With his heart failure at class I he really is not a  candidate for ICD implantation, particularly not a bi-V ICD implant.  I  will plan to watch him very closely and we will see him back in several  months once he has had his echo done.  If his LV function were to  improve, then ICD implantation would not be warranted, nor would it be  warranted if his heart failure remained class I.  Will plan  to see the  patient back in the office in several months.     Champ Mungo. Lovena Le, MD  Electronically Signed    GWT/MedQ  DD: 08/12/2007  DT: 08/12/2007  Job #: [id number]   cc:   Fuller Canada, MD  Loretha Brasil. Lia Foyer, MD, [facility]

## 2011-04-08 NOTE — Letter (Signed)
August 10, 2007    Jeneen Rinks B. Ardeen Jourdain, [place]  [address]  [place], [place] [address]   RE:  Justin Hill, Justin Hill  MRN:  [id number]  /  DOB:  12-07-1940   Dear Clair Gulling,   I had the pleasure of seeing Justin Hill in the office today in  followup. Cardiac wise he has been stable. He has not been having any  chest pain. He has been walking 4 miles a day, and this consists of 2  miles in the morning and 2 miles at night. He denies any progressive  symptoms. He did unfortunately undergo repeat echocardiography today  which continues to demonstrate an ejection fraction in the range of  about 25%. He denies orthopnea and his cardiac class would be considered  1 or 2.   CURRENT MEDICATIONS:  Include:  1. Janumet 500/1000 b.i.d.  2. Avapro 150 mg daily.  3. Simvastatin 40 mg daily.  4. Levothyroxine 25 mcg daily.  5. Multivitamin 1 daily.  6. Vitamin E 400 international units daily.  7. Vitamin C 500 mg daily.  8. Omega III.  9. Carvedilol 6.25 mg p.o. b.i.d.   PHYSICAL EXAMINATION:  Blood pressure is 118/70 and the pulse is 80.  LUNG FIELDS: Clear.  CARDIAC: Rhythm is regular. There is not an S4 gallop.  There is no extremity edema.   The echocardiogram is consistent with non ischemic cardiomyopathy.   To briefly summarize this gentleman has undergone cardiac  catheterization. This has demonstrated mild pulmonary hypertension, and  reduced overall forward output. He continues to have reduced overall LV  function and I am referring him to our electrophysiologist for  consideration of BIV pacing and possible defibrillator management. We  plan to see him back in follow up in 6 weeks. In the interim I have  asked him to gently increase his carvedilol to 1 and a half tablets or  just over 9 mg twice daily. He will return to see [place] in the clinic in  follow up in 6 weeks. Thanks for allowing me to share in his care.    Sincerely,      Loretha Brasil. Lia Foyer, MD, [facility]  Electronically Signed    TDS/MedQ  DD: 08/10/2007  DT: 08/11/2007  Job #: [id number]

## 2011-04-08 NOTE — Assessment & Plan Note (Signed)
[facility] HEALTHCARE                            CARDIOLOGY OFFICE NOTE   NAME:Justin Hill, Justin Hill                 MRN:          [id number]  DATE:07/14/2007                            DOB:          Jul 13, 1941    Justin Hill is in for followup.  He underwent cardiac catheterization.  The patient had presented and had evidence of cardiomyopathy and was put  on good medical therapy at [facility].  At the time of cardiac  catheterization he had some mild luminal irregularities but nothing more  than about 50%.  Ejection fraction was 20-25%.  There was no evidence of  aortic dissection.  He was felt to have a nonischemic cardiomyopathy.  Overall the patient has been stable.  His overall status has been good  since discharge from the hospital.  He has come off of Actos.  Dr.  Ardeen Jourdain is following his glucoses.  We have talked about entering the  rehabilitation program.  We got a BMET today too.   PHYSICAL EXAMINATION:  VITAL SIGNS:  Today blood pressure 120/72, pulse  84.  LUNGS:  Lung fields are clear.  CARDIAC:  Cardiac rhythm is regular.  ABDOMEN:  Groins are healing well.   IMPRESSION:  1. Nonischemic cardiomyopathy.  Question diabetes related.  2. Hypertension.  3. Diabetes.  4. Episode of presyncope previously.   PLAN:  1. Continue medical regimen with increase in carvedilol to 6.25 mg      b.i.d.  2. Return to clinic in four weeks with repeat echocardiogram.  3. I have reviewed his case with Dr. Cristopher Peru.  He will need at      least two months of treatment, and we will get followup      echocardiography.  If his ejection fraction remains below 35%, then      he will need possibly a BiV ACD.  We will make arrangements for him      to see Dr. Lovena Le after I see him back next week.  We will get a      basic metabolic profile.  We will also refer him to the      rehabilitation program at [place] D. Lia Foyer, MD, [facility]  Electronically Signed    TDS/MedQ  DD: 07/14/2007  DT: 07/15/2007  Job #: [id number]   cc:   Andria Frames, M.D.

## 2011-09-08 LAB — POCT I-STAT 3, VENOUS BLOOD GAS (G3P V)
Bicarbonate: 24.3 — ABNORMAL HIGH
Bicarbonate: 25.8 — ABNORMAL HIGH
O2 Saturation: 60
O2 Saturation: 63
TCO2: 26
TCO2: 27
pCO2, Ven: 44.5 — ABNORMAL LOW
pH, Ven: 7.399 — ABNORMAL HIGH
pO2, Ven: 33

## 2011-09-08 LAB — POCT I-STAT 3, ART BLOOD GAS (G3+)
Acid-base deficit: 1
Bicarbonate: 24
O2 Saturation: 95
pO2, Arterial: 73 — ABNORMAL LOW

## 2011-09-08 LAB — POCT I-STAT GLUCOSE: Glucose, Bld: 151 — ABNORMAL HIGH

## 2011-12-04 DIAGNOSIS — Z23 Encounter for immunization: Secondary | ICD-10-CM | POA: Diagnosis not present

## 2011-12-04 DIAGNOSIS — E119 Type 2 diabetes mellitus without complications: Secondary | ICD-10-CM | POA: Diagnosis not present

## 2011-12-04 DIAGNOSIS — E669 Obesity, unspecified: Secondary | ICD-10-CM | POA: Diagnosis not present

## 2011-12-04 DIAGNOSIS — E039 Hypothyroidism, unspecified: Secondary | ICD-10-CM | POA: Diagnosis not present

## 2011-12-04 DIAGNOSIS — E7800 Pure hypercholesterolemia, unspecified: Secondary | ICD-10-CM | POA: Diagnosis not present

## 2011-12-04 DIAGNOSIS — Z79899 Other long term (current) drug therapy: Secondary | ICD-10-CM | POA: Diagnosis not present

## 2011-12-16 ENCOUNTER — Telehealth: Payer: Self-pay | Admitting: Cardiology

## 2011-12-16 DIAGNOSIS — I428 Other cardiomyopathies: Secondary | ICD-10-CM

## 2011-12-16 NOTE — Telephone Encounter (Signed)
New Msg: Justin Hill calling to schedule fu appt with Dr. Lia Foyer however Justin Hill was told to have ECHO prior to FU appt with Dr. Lia Foyer however there is no order for ECHO in Justin Hill chart. Please return Justin Hill wife call to discuss further.

## 2011-12-17 NOTE — Telephone Encounter (Signed)
This pt is due for an echocardiogram.  Order placed in system. [facility] will contact the pt with appointment.

## 2011-12-29 DIAGNOSIS — R6889 Other general symptoms and signs: Secondary | ICD-10-CM | POA: Diagnosis not present

## 2011-12-30 DIAGNOSIS — E119 Type 2 diabetes mellitus without complications: Secondary | ICD-10-CM | POA: Diagnosis not present

## 2011-12-30 DIAGNOSIS — E785 Hyperlipidemia, unspecified: Secondary | ICD-10-CM | POA: Diagnosis not present

## 2011-12-30 DIAGNOSIS — J069 Acute upper respiratory infection, unspecified: Secondary | ICD-10-CM | POA: Diagnosis not present

## 2012-01-01 DIAGNOSIS — Q828 Other specified congenital malformations of skin: Secondary | ICD-10-CM | POA: Diagnosis not present

## 2012-01-01 DIAGNOSIS — E1149 Type 2 diabetes mellitus with other diabetic neurological complication: Secondary | ICD-10-CM | POA: Diagnosis not present

## 2012-01-01 DIAGNOSIS — L608 Other nail disorders: Secondary | ICD-10-CM | POA: Diagnosis not present

## 2012-01-12 ENCOUNTER — Other Ambulatory Visit: Payer: Self-pay

## 2012-01-12 ENCOUNTER — Ambulatory Visit (HOSPITAL_COMMUNITY): Payer: Medicare Other | Attending: Cardiology | Admitting: Radiology

## 2012-01-12 ENCOUNTER — Encounter: Payer: Self-pay | Admitting: Cardiology

## 2012-01-12 ENCOUNTER — Ambulatory Visit (INDEPENDENT_AMBULATORY_CARE_PROVIDER_SITE_OTHER): Payer: Medicare Other | Admitting: Cardiology

## 2012-01-12 VITALS — BP 130/80 | HR 85 | Ht 72.0 in | Wt 254.0 lb

## 2012-01-12 DIAGNOSIS — I428 Other cardiomyopathies: Secondary | ICD-10-CM | POA: Insufficient documentation

## 2012-01-12 DIAGNOSIS — E119 Type 2 diabetes mellitus without complications: Secondary | ICD-10-CM | POA: Insufficient documentation

## 2012-01-12 DIAGNOSIS — E785 Hyperlipidemia, unspecified: Secondary | ICD-10-CM | POA: Diagnosis not present

## 2012-01-12 DIAGNOSIS — E669 Obesity, unspecified: Secondary | ICD-10-CM | POA: Diagnosis not present

## 2012-01-12 DIAGNOSIS — E7800 Pure hypercholesterolemia, unspecified: Secondary | ICD-10-CM | POA: Diagnosis not present

## 2012-01-12 DIAGNOSIS — I509 Heart failure, unspecified: Secondary | ICD-10-CM | POA: Diagnosis not present

## 2012-01-12 DIAGNOSIS — Z8679 Personal history of other diseases of the circulatory system: Secondary | ICD-10-CM | POA: Diagnosis not present

## 2012-01-12 DIAGNOSIS — I059 Rheumatic mitral valve disease, unspecified: Secondary | ICD-10-CM | POA: Insufficient documentation

## 2012-01-12 NOTE — Assessment & Plan Note (Signed)
Very long discussion with patient.  He says he is truly class I.  He had seen Dr. Lovena Le, and does not really want to consider a device at this time.  In careful questioning, it is hard to get him into class II.  We will follow him closely, and if his status changes, he knows to call me.  We are awaiting the official report of his echo, but I have reviewed.

## 2012-01-12 NOTE — Assessment & Plan Note (Signed)
Controlled.  Not an ACE candidate because of issues with meds.

## 2012-01-12 NOTE — Patient Instructions (Signed)
Your physician recommends that you schedule a follow-up appointment in: 4 MONTHS  Your physician recommends that you continue on your current medications as directed. Please refer to the Current Medication list given to you today.   

## 2012-01-12 NOTE — Progress Notes (Signed)
HPI: Doing really well, but may need to go on insulin.  No change in weight.  He has no symptoms of CHF, and it is difficult to say he is anything other than class I.  He might get a little short of breath with raking a lot of leaves, but that is virtually it.  He and his wife confirm that he is virtually asymptomatic.  I discussed this with him in detail, and he prefers the opinion of Dr. Rubbie Battiest would rather defer the idea of pacing defib.  We talked in detail about this.    Current Outpatient Prescriptions  Medication Sig Dispense Refill  . Ascorbic Acid (VITAMIN C) 500 MG CAPS Take 1 capsule by mouth daily.        . Bilberry, Vaccinium myrtillus, (BILBERRY EXTRACT PO) Take by mouth. 1.000mg  once a day       . carvedilol (COREG) 25 MG tablet Take 1 tablet (25 mg total) by mouth 2 (two) times daily with a meal.  180 tablet  3  . Choline Fenofibrate (TRILIPIX) 135 MG capsule Take 135 mg by mouth daily.        . fluticasone (FLONASE) 50 MCG/ACT nasal spray PRN      . glimepiride (AMARYL) 1 MG tablet Take 1 mg by mouth 2 (two) times daily.        . Multiple Vitamin (MULTIVITAMIN) tablet Take 1 tablet by mouth daily.        . simvastatin (ZOCOR) 40 MG tablet Take 40 mg by mouth at bedtime.        . sitaGLIPtan (JANUVIA) 50 MG tablet Take 50 mg by mouth daily.        . vitamin E 400 UNIT capsule Take 400 Units by mouth daily.          Allergies  Allergen Reactions  . Aspirin   . Penicillins     Past Medical History  Diagnosis Date  . CHF (congestive heart failure)   . Hypertension   . Diabetes mellitus   . Hypercholesterolemia   . Cardiomyopathy     Past Surgical History  Procedure Date  . Shoulder surgery 1981    left. Muscle reattachment  . Tonsillectomy     Family History  Problem Relation Age of Onset  . Other Father 76    unknown cause  . Other Mother 29    unknown cause  . Heart failure Other     maternal grandmother  . Diabetes type II Sister 25    History    Social History  . Marital Status: Married    Spouse Name: N/A    Number of Children: 1  . Years of Education: N/A   Occupational History  . retired    Social History Main Topics  . Smoking status: Former Smoker    Types: Cigarettes  . Smokeless tobacco: Not on file  . Alcohol Use: No  . Drug Use: No  . Sexually Active: Not on file   Other Topics Concern  . Not on file   Social History Narrative  . No narrative on file    ROS: Please see the HPI.  All other systems reviewed and negative.  PHYSICAL EXAM:  BP 130/80  Pulse 85  Ht 6' (1.829 m)  Wt 254 lb (115.214 kg)  BMI 34.45 kg/m2  General: Well developed, well nourished, in no acute distress. Head:  Normocephalic and atraumatic. Neck: no JVD Lungs: Clear to auscultation and percussion. Heart: Normal S1 and  S2.  No murmur, rubs or gallops. Paradoxical S2 Abdomen:  Normal bowel sounds; soft; non tender; no organomegaly. Obese Pulses: Pulses normal in all 4 extremities. Extremities: No clubbing or cyanosis. No edema. Neurologic: Alert and oriented x 3.  EKG:  NSR.  LBBB.   ASSESSMENT AND PLAN:

## 2012-01-12 NOTE — Assessment & Plan Note (Signed)
Under care of diabetic physicians, about to see Dr. Loanne Drilling.

## 2012-01-20 ENCOUNTER — Ambulatory Visit: Payer: PRIVATE HEALTH INSURANCE | Admitting: Endocrinology

## 2012-01-28 DIAGNOSIS — E1149 Type 2 diabetes mellitus with other diabetic neurological complication: Secondary | ICD-10-CM | POA: Diagnosis not present

## 2012-01-28 DIAGNOSIS — L97509 Non-pressure chronic ulcer of other part of unspecified foot with unspecified severity: Secondary | ICD-10-CM | POA: Diagnosis not present

## 2012-02-10 ENCOUNTER — Encounter: Payer: Self-pay | Admitting: Endocrinology

## 2012-02-10 ENCOUNTER — Ambulatory Visit (INDEPENDENT_AMBULATORY_CARE_PROVIDER_SITE_OTHER): Payer: Medicare Other | Admitting: Endocrinology

## 2012-02-10 VITALS — BP 132/82 | HR 80 | Temp 98.3°F | Ht 71.0 in | Wt 251.0 lb

## 2012-02-10 DIAGNOSIS — E119 Type 2 diabetes mellitus without complications: Secondary | ICD-10-CM

## 2012-02-10 DIAGNOSIS — N289 Disorder of kidney and ureter, unspecified: Secondary | ICD-10-CM | POA: Insufficient documentation

## 2012-02-10 DIAGNOSIS — I10 Essential (primary) hypertension: Secondary | ICD-10-CM

## 2012-02-10 DIAGNOSIS — I509 Heart failure, unspecified: Secondary | ICD-10-CM | POA: Diagnosis not present

## 2012-02-10 MED ORDER — BROMOCRIPTINE MESYLATE 2.5 MG PO TABS: 2.5000 mg | ORAL_TABLET | Freq: Two times a day (BID) | ORAL | Status: DC

## 2012-02-10 NOTE — Patient Instructions (Addendum)
good diet and exercise habits significanly improve the control of your diabetes.  please let me know if you wish to be referred to a dietician.  high blood sugar is very risky to your health.  you should see an eye doctor every year. controlling your blood pressure and cholesterol drastically reduces the damage diabetes does to your body.  this also applies to quitting smoking.  please discuss these with your doctor.   check your blood sugar 1 time a day.  vary the time of day when you check, between before the 3 meals, and at bedtime.  also check if you have symptoms of your blood sugar being too high or too low.  please keep a record of the readings and bring it to your next appointment here.  please call us sooner if your blood sugar goes below 70, or if it stays over 200. You may need to take insulin, but here are the alternatives to insulin:  victoza (a more effective alternative to [place]), bromocriptine, and welchol (cholesterol medication). For now, i have sent a prescription to your pharmacy, for the bromocriptine.  Start at 1/2 pill at bedtime, then increase to 1 pill at bedtime, then 1 pill, 2x a day).

## 2012-02-10 NOTE — Progress Notes (Signed)
Subjective:    Patient ID: Justin Hill, male    DOB: Apr 08, 1941, 71 y.o.   MRN: [id number]  HPI pt states 10 years h/o dm.  it is complicated by chf.  he has never been on insulin.  pt says his diet and exercise are both poor.  Pt says he is very reluctant to take insulin. Pt states few years of moderate weight gain, worst at the abdominal area, but no assoc heartburn.   Past Medical History  Diagnosis Date  . CHF (congestive heart failure)   . Hypertension   . Diabetes mellitus   . Hypercholesterolemia   . Cardiomyopathy   . Personal history of kidney stones     Past Surgical History  Procedure Date  . Shoulder surgery 1981    left. Muscle reattachment  . Tonsillectomy     History   Social History  . Marital Status: Married    Spouse Name: N/A    Number of Children: 1  . Years of Education: N/A   Occupational History  . retired    Social History Main Topics  . Smoking status: Former Smoker    Types: Cigarettes  . Smokeless tobacco: Not on file  . Alcohol Use: No  . Drug Use: No  . Sexually Active: Not on file   Other Topics Concern  . Not on file   Social History Narrative  . No narrative on file    Current Outpatient Prescriptions on File Prior to Visit  Medication Sig Dispense Refill  . Ascorbic Acid (VITAMIN C) 500 MG CAPS Take 1 capsule by mouth 2 (two) times daily.       . Bilberry, Vaccinium myrtillus, (BILBERRY EXTRACT PO) Take by mouth. 1.000mg  once a day       . carvedilol (COREG) 25 MG tablet Take 1 tablet (25 mg total) by mouth 2 (two) times daily with a meal.  180 tablet  3  . Choline Fenofibrate (TRILIPIX) 135 MG capsule Take 135 mg by mouth daily.        . fluticasone (FLONASE) 50 MCG/ACT nasal spray PRN      . glimepiride (AMARYL) 1 MG tablet Take 1 mg by mouth 2 (two) times daily.        . Multiple Vitamin (MULTIVITAMIN) tablet Take 1 tablet by mouth daily.        . simvastatin (ZOCOR) 40 MG tablet Take 40 mg by mouth at bedtime.         . vitamin E 400 UNIT capsule Take 400 Units by mouth daily.          Allergies  Allergen Reactions  . Aspirin   . Penicillins    Family History  Problem Relation Age of Onset  . Other Father 36    unknown cause  . Other Mother 27    unknown cause  . Heart failure Other     maternal grandmother  . Diabetes type II Sister 66  DM: both parents and 1 sib BP 132/82  Pulse 80  Temp(Src) 98.3 F (36.8 C) (Oral)  Ht 5\' 11"  (1.803 m)  Wt 251 lb (113.853 kg)  BMI 35.01 kg/m2  SpO2 95%  Review of Systems denies blurry vision, headache, chest pain, sob, n/v, urinary frequency, cramps, excessive diaphoresis, memory loss, depression, hypoglycemia, rhinorrhea, and easy bruising.        Objective:   Physical Exam VS: see vs page GEN: no distress HEAD: head: no deformity eyes: no periorbital swelling, no proptosis external  nose and ears are normal mouth: no lesion seen NECK: supple, thyroid is not enlarged CHEST WALL: no deformity LUNGS: clear to auscultation CV: reg rate and rhythm, no murmur ABD: abdomen is soft, nontender.  no hepatosplenomegaly.  not distended.  no hernia MUSCULOSKELETAL: muscle bulk and strength are grossly normal.  no obvious joint swelling.  gait is normal and steady EXTEMITIES: no deformity, except for hammer toes on the left foot.  no ulcer on the feet.  feet are of normal temp.  1+ bilat leg edema.  There is bilateral onychomycosis, rust-discoloration, and varicosities.   PULSES: dorsalis pedis intact bilat.  no carotid bruit NEURO:  cn 2-12 grossly intact.   readily moves all 4's.  sensation is intact to touch on the feet SKIN:  Normal texture and temperature.  No rash or suspicious lesion is visible.   NODES:  None palpable at the neck.   PSYCH: alert, oriented x3.  Does not appear anxious nor depressed.   (wife says a1c last month was 9.1).    Assessment & Plan:  DM, needs increased rx.  He will probably need insulin, but he is hesitant.  i  demonstrated victoza pen Weight gain.  This complicates the rx of DM CHF.  This limits po rx options of DM. HTN and dyslipidemia.  Good control of these (which this pt is receiving) limits the complications of DM

## 2012-02-11 ENCOUNTER — Encounter: Payer: Self-pay | Admitting: Endocrinology

## 2012-02-11 DIAGNOSIS — L97509 Non-pressure chronic ulcer of other part of unspecified foot with unspecified severity: Secondary | ICD-10-CM | POA: Diagnosis not present

## 2012-02-11 DIAGNOSIS — E1149 Type 2 diabetes mellitus with other diabetic neurological complication: Secondary | ICD-10-CM | POA: Diagnosis not present

## 2012-02-11 DIAGNOSIS — I10 Essential (primary) hypertension: Secondary | ICD-10-CM | POA: Insufficient documentation

## 2012-02-11 DIAGNOSIS — I509 Heart failure, unspecified: Secondary | ICD-10-CM | POA: Insufficient documentation

## 2012-02-11 DIAGNOSIS — E7800 Pure hypercholesterolemia, unspecified: Secondary | ICD-10-CM | POA: Insufficient documentation

## 2012-03-12 ENCOUNTER — Ambulatory Visit: Payer: Medicare Other | Admitting: Endocrinology

## 2012-03-31 ENCOUNTER — Other Ambulatory Visit: Payer: Self-pay | Admitting: Cardiology

## 2012-04-01 DIAGNOSIS — E1149 Type 2 diabetes mellitus with other diabetic neurological complication: Secondary | ICD-10-CM | POA: Diagnosis not present

## 2012-04-01 DIAGNOSIS — L608 Other nail disorders: Secondary | ICD-10-CM | POA: Diagnosis not present

## 2012-04-01 DIAGNOSIS — Q828 Other specified congenital malformations of skin: Secondary | ICD-10-CM | POA: Diagnosis not present

## 2012-04-02 ENCOUNTER — Ambulatory Visit: Payer: Medicare Other | Admitting: Endocrinology

## 2012-04-08 ENCOUNTER — Ambulatory Visit (INDEPENDENT_AMBULATORY_CARE_PROVIDER_SITE_OTHER): Payer: Medicare Other | Admitting: Endocrinology

## 2012-04-08 ENCOUNTER — Other Ambulatory Visit (INDEPENDENT_AMBULATORY_CARE_PROVIDER_SITE_OTHER): Payer: Medicare Other

## 2012-04-08 ENCOUNTER — Encounter: Payer: Self-pay | Admitting: Endocrinology

## 2012-04-08 VITALS — BP 118/82 | HR 82 | Temp 97.9°F | Ht 71.0 in | Wt 256.0 lb

## 2012-04-08 DIAGNOSIS — E119 Type 2 diabetes mellitus without complications: Secondary | ICD-10-CM

## 2012-04-08 NOTE — Patient Instructions (Addendum)
check your blood sugar 1 time a day.  vary the time of day when you check, between before the 3 meals, and at bedtime.  also check if you have symptoms of your blood sugar being too high or too low.  please keep a record of the readings and bring it to your next appointment here.  please call us sooner if your blood sugar goes below 70, or if it stays over 200.  blood tests are being requested for you today.  You will receive a letter with results. Here are some samples of "invokana."  Take 1 per day. Please come back for a follow-up appointment in 6 weeks.

## 2012-04-08 NOTE — Progress Notes (Signed)
Subjective:    Patient ID: Justin Hill, male    DOB: June 30, 1941, 71 y.o.   MRN: [id number]  HPI pt returns for f/u of type 2 DM (dx'ed 2003).  it is complicated by chf and renal insufficiency.  he has never been on insulin.  He can't take metformin due to renal insuff, and can't take actos due to edema.  pt states he feels well in general.  He seldom checks cbg's.  He says it is in the 200's.   Past Medical History  Diagnosis Date  . Cardiomyopathy   . Personal history of kidney stones   . Cardiomyopathy   . CHF (congestive heart failure)   . Diabetes mellitus   . Hypercholesterolemia   . Hypertension     Past Surgical History  Procedure Date  . Shoulder surgery 1981    left. Muscle reattachment  . Tonsillectomy     History   Social History  . Marital Status: Married    Spouse Name: N/A    Number of Children: 1  . Years of Education: 12   Occupational History  . retired    Social History Main Topics  . Smoking status: Former Smoker    Types: Cigarettes  . Smokeless tobacco: Not on file  . Alcohol Use: No  . Drug Use: No  . Sexually Active: Not on file   Other Topics Concern  . Not on file   Social History Narrative   Regular exercise-no    Current Outpatient Prescriptions on File Prior to Visit  Medication Sig Dispense Refill  . Ascorbic Acid (VITAMIN C) 500 MG CAPS Take 1 capsule by mouth 2 (two) times daily.       . Bilberry, Vaccinium myrtillus, (BILBERRY EXTRACT PO) Take by mouth. 1.000mg  once a day       . bromocriptine (PARLODEL) 2.5 MG tablet Take 1 tablet (2.5 mg total) by mouth 2 (two) times daily.  60 tablet  11  . carvedilol (COREG) 25 MG tablet TAKE ONE TABLET BY MOUTH TWICE DAILY WITH A MEAL.  180 tablet  2  . glimepiride (AMARYL) 1 MG tablet Take 1 mg by mouth 2 (two) times daily.        . Multiple Vitamin (MULTIVITAMIN) tablet Take 1 tablet by mouth daily.        . simvastatin (ZOCOR) 40 MG tablet Take 40 mg by mouth at bedtime.          . sitaGLIPtin (JANUVIA) 100 MG tablet Take 100 mg by mouth daily.      . vitamin E 400 UNIT capsule Take 400 Units by mouth daily.          Allergies  Allergen Reactions  . Aspirin   . Penicillins     Family History  Problem Relation Age of Onset  . Other Father 5    unknown cause  . Other Mother 66    unknown cause  . Heart failure Other     maternal grandmother  . Diabetes type II Sister 6  . Hyperlipidemia Other     Parent  . Stroke Other     Parent  . Hypertension Other     Parent  . Diabetes Other     Parent    BP 118/82  Pulse 82  Temp(Src) 97.9 F (36.6 C) (Oral)  Ht 5\' 11"  (1.803 m)  Wt 256 lb (116.121 kg)  BMI 35.70 kg/m2  SpO2 95%    Review of Systems denies hypoglycemia  Objective:   Physical Exam VITAL SIGNS:  See vs page GENERAL: no distress Ext: trace bilat leg edema.   Lab Results  Component Value Date   HGBA1C 8.6* 04/08/2012      Assessment & Plan:  DM.  needs increased rx

## 2012-04-26 ENCOUNTER — Telehealth: Payer: Self-pay

## 2012-04-26 MED ORDER — CANAGLIFLOZIN 300 MG PO TABS: 1.0000 | ORAL_TABLET | ORAL | Status: DC

## 2012-04-26 NOTE — Telephone Encounter (Signed)
Pt informed rx sent to [facility].

## 2012-04-26 NOTE — Telephone Encounter (Signed)
Pt called requesting Rx for invokana. Per pt, he received samples last OV which has been working well.

## 2012-04-26 NOTE — Telephone Encounter (Signed)
i sent rx 

## 2012-04-27 ENCOUNTER — Telehealth: Payer: Self-pay | Admitting: *Deleted

## 2012-04-27 NOTE — Telephone Encounter (Signed)
i'll do prior [place]

## 2012-04-27 NOTE — Telephone Encounter (Signed)
R'cd fax from [facility] in [place] to change Invokana or to complete PA-drug is not listed on pt's formulary and cost is $301.84-please advise.

## 2012-04-28 NOTE — Telephone Encounter (Signed)
PA form received and placed on MD's desk for completion.

## 2012-04-28 NOTE — Telephone Encounter (Signed)
PA form faxed to [facility]-awaiting reply.

## 2012-04-28 NOTE — Telephone Encounter (Signed)
PA for Invokana order, awaiting fax.

## 2012-04-29 NOTE — Telephone Encounter (Signed)
PA for Invokana improved until 11/23/2012.  [place] informed, also informed pt on VM.

## 2012-05-12 ENCOUNTER — Ambulatory Visit: Payer: Medicare Other | Admitting: Cardiology

## 2012-05-20 ENCOUNTER — Ambulatory Visit: Payer: Medicare Other | Admitting: Endocrinology

## 2012-05-24 DIAGNOSIS — E119 Type 2 diabetes mellitus without complications: Secondary | ICD-10-CM | POA: Diagnosis not present

## 2012-05-24 DIAGNOSIS — H2589 Other age-related cataract: Secondary | ICD-10-CM | POA: Diagnosis not present

## 2012-05-24 DIAGNOSIS — H04129 Dry eye syndrome of unspecified lacrimal gland: Secondary | ICD-10-CM | POA: Diagnosis not present

## 2012-06-03 DIAGNOSIS — L608 Other nail disorders: Secondary | ICD-10-CM | POA: Diagnosis not present

## 2012-06-03 DIAGNOSIS — E1149 Type 2 diabetes mellitus with other diabetic neurological complication: Secondary | ICD-10-CM | POA: Diagnosis not present

## 2012-06-03 DIAGNOSIS — L97509 Non-pressure chronic ulcer of other part of unspecified foot with unspecified severity: Secondary | ICD-10-CM | POA: Diagnosis not present

## 2012-06-07 ENCOUNTER — Ambulatory Visit (INDEPENDENT_AMBULATORY_CARE_PROVIDER_SITE_OTHER): Payer: Medicare Other | Admitting: Cardiology

## 2012-06-07 ENCOUNTER — Encounter: Payer: Self-pay | Admitting: Cardiology

## 2012-06-07 VITALS — BP 110/54 | HR 93 | Ht 71.0 in | Wt 249.0 lb

## 2012-06-07 DIAGNOSIS — I10 Essential (primary) hypertension: Secondary | ICD-10-CM | POA: Diagnosis not present

## 2012-06-07 DIAGNOSIS — I509 Heart failure, unspecified: Secondary | ICD-10-CM

## 2012-06-07 DIAGNOSIS — E119 Type 2 diabetes mellitus without complications: Secondary | ICD-10-CM | POA: Diagnosis not present

## 2012-06-07 DIAGNOSIS — I428 Other cardiomyopathies: Secondary | ICD-10-CM | POA: Diagnosis not present

## 2012-06-07 NOTE — Progress Notes (Signed)
HPI:  Mr. Carlstrom is seen in a followup visit. In general he continues to get along extremely well. He denies any significant shortness of breath associated with exertion. He rides his tractor, and also rides his riding lawnmower and does so without any limitation. Denies any chest pain. The patient has had prior electrophysiologic evaluation which time he was noninducible. He's been on chronic beta blocker therapy, but his ACE inhibitors were stopped during hospitalization in [place] at which time he was markedly hypovolemic.  He is not in the interim been restarted.  He is seeing Dr. Loanne Drilling for DM management.    Current Outpatient Prescriptions  Medication Sig Dispense Refill  . Ascorbic Acid (VITAMIN C) 500 MG CAPS Take 1 capsule by mouth 2 (two) times daily.       . Bilberry, Vaccinium myrtillus, (BILBERRY EXTRACT PO) Take by mouth. 1.000mg  once a day       . bromocriptine (PARLODEL) 2.5 MG tablet Take 1 tablet (2.5 mg total) by mouth 2 (two) times daily.  60 tablet  11  . Canagliflozin (INVOKANA) 300 MG TABS Take 1 tablet by mouth every morning.  30 tablet  11  . carvedilol (COREG) 25 MG tablet TAKE ONE TABLET BY MOUTH TWICE DAILY WITH A MEAL.  180 tablet  2  . glimepiride (AMARYL) 1 MG tablet Take 1 mg by mouth 2 (two) times daily.        . Multiple Vitamin (MULTIVITAMIN) tablet Take 1 tablet by mouth daily.        . simvastatin (ZOCOR) 40 MG tablet Take 40 mg by mouth at bedtime.        . sitaGLIPtin (JANUVIA) 100 MG tablet Take 100 mg by mouth daily.      . TRILIPIX 135 MG capsule Take 1 tablet by mouth daily.      Marland Kitchen UNABLE TO FIND invokana 100 mg daily      . vitamin E 400 UNIT capsule Take 400 Units by mouth daily.          Allergies  Allergen Reactions  . Aspirin   . Penicillins     Past Medical History  Diagnosis Date  . Cardiomyopathy   . Personal history of kidney stones   . Cardiomyopathy   . CHF (congestive heart failure)   . Diabetes mellitus   .  Hypercholesterolemia   . Hypertension     Past Surgical History  Procedure Date  . Shoulder surgery 1981    left. Muscle reattachment  . Tonsillectomy     Family History  Problem Relation Age of Onset  . Other Father 25    unknown cause  . Other Mother 35    unknown cause  . Heart failure Other     maternal grandmother  . Diabetes type II Sister 68  . Hyperlipidemia Other     Parent  . Stroke Other     Parent  . Hypertension Other     Parent  . Diabetes Other     Parent    History   Social History  . Marital Status: Married    Spouse Name: N/A    Number of Children: 1  . Years of Education: 12   Occupational History  . retired    Social History Main Topics  . Smoking status: Former Smoker    Types: Cigarettes  . Smokeless tobacco: Not on file  . Alcohol Use: No  . Drug Use: No  . Sexually Active: Not on file  Other Topics Concern  . Not on file   Social History Narrative   Regular exercise-no    ROS: Please see the HPI.  All other systems reviewed and negative.  PHYSICAL EXAM:  BP 110/54  Pulse 93  Ht 5\' 11"  (1.803 m)  Wt 249 lb (112.946 kg)  BMI 34.73 kg/m2  SpO2 97%  General: Well developed, moderately obese, in no acute distress. Head:  Normocephalic and atraumatic. Neck: no JVD Lungs: Clear to auscultation and percussion. Heart: Normal S1 and paradoxical S2.  No murmur.  Pos S4.   Abdomen:  Normal bowel sounds; soft; non tender; no organomegaly Pulses: Pulses normal in all 4 extremities. Extremities: No clubbing or cyanosis. No edema. Neurologic: Alert and oriented x 3.  EKG:  NSR.  LBBB.    ASSESSMENT AND PLAN:

## 2012-06-07 NOTE — Patient Instructions (Addendum)
Your physician wants you to follow-up in: 6 months. You will receive a reminder letter in the mail two months in advance. If you don't receive a letter, please call our office to schedule the follow-up appointment.  Your physician has requested that you have an echocardiogram. Echocardiography is a painless test that uses sound waves to create images of your heart. It provides your doctor with information about the size and shape of your heart and how well your heart's chambers and valves are working. This procedure takes approximately one hour. There are no restrictions for this procedure. To be done in 6 months.

## 2012-06-07 NOTE — Assessment & Plan Note (Signed)
His BP is well controlled, and is at target for DM.

## 2012-06-07 NOTE — Assessment & Plan Note (Addendum)
The patients symptoms have not changed at all.  He remains class I and they are identical to what he had in the past.  We will continue to monitor him closely over time.  His EF is in the 20-25% range, but in the setting of LBBB.  We willl see him back in follow up in about six months and repeat his echo.  He is to report any new symptoms to me should they develop.  He is agreeable.

## 2012-06-07 NOTE — Assessment & Plan Note (Signed)
Being managed by Dr. Loanne Drilling.  He might be a candidate for ACE again, but it would have to be watched closely as he had trouble in the past with volume contraction.

## 2012-06-17 ENCOUNTER — Ambulatory Visit (INDEPENDENT_AMBULATORY_CARE_PROVIDER_SITE_OTHER): Payer: Medicare Other | Admitting: Endocrinology

## 2012-06-17 ENCOUNTER — Encounter: Payer: Self-pay | Admitting: Endocrinology

## 2012-06-17 VITALS — BP 116/78 | HR 86 | Temp 96.7°F | Ht 71.0 in | Wt 251.0 lb

## 2012-06-17 DIAGNOSIS — E119 Type 2 diabetes mellitus without complications: Secondary | ICD-10-CM

## 2012-06-17 MED ORDER — INSULIN LISPRO 100 UNIT/ML ~~LOC~~ SOLN: 5.0000 [IU] | Freq: Three times a day (TID) | SUBCUTANEOUS | Status: DC

## 2012-06-17 NOTE — Patient Instructions (Addendum)
check your blood sugar twice a day.  vary the time of day when you check, between before the 3 meals, and at bedtime.  also check if you have symptoms of your blood sugar being too high or too low.  please keep a record of the readings and bring it to your next appointment here.  please call us sooner if your blood sugar goes below 70, or if it stays over 200.  Start humalog 5 units 3x a day (just before each meal) Please come back for a follow-up appointment in 2 weeks.   Stop [place].

## 2012-06-17 NOTE — Progress Notes (Signed)
Subjective:    Patient ID: Justin Hill, male    DOB: 08/14/1941, 71 y.o.   MRN: [id number]  HPI pt returns for f/u of type 2 DM (dx'ed 123456; complicated by chf and renal insufficiency.  he has never been on insulin.  He can't take metformin due to renal insuff, and can't take actos due to edema.  pt states he feels well in general.  he brings a record of his cbg's which i have reviewed today.  It varies from 150-200.  There is no trend throughout the day.  He has some polyuria since he has been on the invokana.  He has agreed to start insulin.   Past Medical History  Diagnosis Date  . Cardiomyopathy   . Personal history of kidney stones   . Cardiomyopathy   . CHF (congestive heart failure)   . Diabetes mellitus   . Hypercholesterolemia   . Hypertension     Past Surgical History  Procedure Date  . Shoulder surgery 1981    left. Muscle reattachment  . Tonsillectomy     History   Social History  . Marital Status: Married    Spouse Name: N/A    Number of Children: 1  . Years of Education: 12   Occupational History  . retired    Social History Main Topics  . Smoking status: Former Smoker    Types: Cigarettes  . Smokeless tobacco: Not on file  . Alcohol Use: No  . Drug Use: No  . Sexually Active: Not on file   Other Topics Concern  . Not on file   Social History Narrative   Regular exercise-no    Current Outpatient Prescriptions on File Prior to Visit  Medication Sig Dispense Refill  . Ascorbic Acid (VITAMIN C) 500 MG CAPS Take 1 capsule by mouth 2 (two) times daily.       . Bilberry, Vaccinium myrtillus, (BILBERRY EXTRACT PO) Take by mouth. 1.000mg  once a day       . bromocriptine (PARLODEL) 2.5 MG tablet Take 1 tablet (2.5 mg total) by mouth 2 (two) times daily.  60 tablet  11  . Canagliflozin (INVOKANA) 300 MG TABS Take 1 tablet by mouth every morning.  30 tablet  11  . carvedilol (COREG) 25 MG tablet TAKE ONE TABLET BY MOUTH TWICE DAILY WITH A MEAL.  180  tablet  2  . glimepiride (AMARYL) 1 MG tablet Take 1 mg by mouth 2 (two) times daily.        . insulin lispro (HUMALOG KWIKPEN) 100 UNIT/ML injection Inject 5 Units into the skin 3 (three) times daily before meals. And pen needles 3/day  15 mL  11  . Multiple Vitamin (MULTIVITAMIN) tablet Take 1 tablet by mouth daily.        . simvastatin (ZOCOR) 40 MG tablet Take 40 mg by mouth at bedtime.        . TRILIPIX 135 MG capsule Take 1 tablet by mouth daily.      . vitamin E 400 UNIT capsule Take 400 Units by mouth daily.          Allergies  Allergen Reactions  . Aspirin   . Penicillins     Family History  Problem Relation Age of Onset  . Other Father 50    unknown cause  . Other Mother 18    unknown cause  . Heart failure Other     maternal grandmother  . Diabetes type II Sister 68  . Hyperlipidemia  Other     Parent  . Stroke Other     Parent  . Hypertension Other     Parent  . Diabetes Other     Parent   BP 116/78  Pulse 86  Temp 96.7 F (35.9 C) (Oral)  Ht 5\' 11"  (1.803 m)  Wt 251 lb (113.853 kg)  BMI 35.01 kg/m2  SpO2 95%  Review of Systems denies hypoglycemia    Objective:   Physical Exam VITAL SIGNS:  See vs page GENERAL: no distress Ext: trace bilat leg edema.      Assessment & Plan:  DM.  needs increased rx

## 2012-06-21 ENCOUNTER — Telehealth: Payer: Self-pay

## 2012-06-21 NOTE — Telephone Encounter (Signed)
Pt's spouse called stating pt's CBG was 181, 189 and 200 over the weekend. Please advise - does pt need adjustments of oral medicines?

## 2012-06-22 MED ORDER — INSULIN LISPRO 100 UNIT/ML ~~LOC~~ SOLN: 10.0000 [IU] | Freq: Three times a day (TID) | SUBCUTANEOUS | Status: DC

## 2012-06-22 NOTE — Telephone Encounter (Signed)
Pt's spouse informed of MD's advisement regarding insulin adjustment.

## 2012-06-22 NOTE — Telephone Encounter (Signed)
Increase insulin to 10 units tid (qac)

## 2012-06-24 DIAGNOSIS — L97509 Non-pressure chronic ulcer of other part of unspecified foot with unspecified severity: Secondary | ICD-10-CM | POA: Diagnosis not present

## 2012-06-25 DIAGNOSIS — E119 Type 2 diabetes mellitus without complications: Secondary | ICD-10-CM | POA: Diagnosis not present

## 2012-06-25 DIAGNOSIS — E785 Hyperlipidemia, unspecified: Secondary | ICD-10-CM | POA: Diagnosis not present

## 2012-06-29 DIAGNOSIS — R350 Frequency of micturition: Secondary | ICD-10-CM | POA: Diagnosis not present

## 2012-06-29 DIAGNOSIS — Z125 Encounter for screening for malignant neoplasm of prostate: Secondary | ICD-10-CM | POA: Diagnosis not present

## 2012-06-29 DIAGNOSIS — I10 Essential (primary) hypertension: Secondary | ICD-10-CM | POA: Diagnosis not present

## 2012-06-29 DIAGNOSIS — Z23 Encounter for immunization: Secondary | ICD-10-CM | POA: Diagnosis not present

## 2012-06-29 DIAGNOSIS — Z79899 Other long term (current) drug therapy: Secondary | ICD-10-CM | POA: Diagnosis not present

## 2012-07-01 ENCOUNTER — Encounter: Payer: Self-pay | Admitting: Endocrinology

## 2012-07-01 ENCOUNTER — Ambulatory Visit (INDEPENDENT_AMBULATORY_CARE_PROVIDER_SITE_OTHER): Payer: Medicare Other | Admitting: Endocrinology

## 2012-07-01 VITALS — BP 102/72 | HR 81 | Temp 97.5°F | Ht 71.0 in | Wt 249.0 lb

## 2012-07-01 DIAGNOSIS — E119 Type 2 diabetes mellitus without complications: Secondary | ICD-10-CM

## 2012-07-01 MED ORDER — INSULIN LISPRO 100 UNIT/ML ~~LOC~~ SOLN: 15.0000 [IU] | Freq: Three times a day (TID) | SUBCUTANEOUS | Status: DC

## 2012-07-01 NOTE — Progress Notes (Signed)
Subjective:    Patient ID: Justin Hill, male    DOB: 27-Aug-1941, 71 y.o.   MRN: [id number]  HPI pt returns for f/u of type 2 DM (dx'ed 123456; complicated by chf and renal insufficiency).  he has never been on insulin.  He can't take metformin due to renal insuff, and can't take actos due to edema.  pt states he feels well in general.  he brings a record of his cbg's which i have reviewed today.  It varies from 150-200, despite increasing the humalog to 10 units tid.  There is no trend throughout the day. Past Medical History  Diagnosis Date  . Cardiomyopathy   . Personal history of kidney stones   . Cardiomyopathy   . CHF (congestive heart failure)   . Diabetes mellitus   . Hypercholesterolemia   . Hypertension     Past Surgical History  Procedure Date  . Shoulder surgery 1981    left. Muscle reattachment  . Tonsillectomy     History   Social History  . Marital Status: Married    Spouse Name: N/A    Number of Children: 1  . Years of Education: 12   Occupational History  . retired    Social History Main Topics  . Smoking status: Former Smoker    Types: Cigarettes  . Smokeless tobacco: Not on file  . Alcohol Use: No  . Drug Use: No  . Sexually Active: Not on file   Other Topics Concern  . Not on file   Social History Narrative   Regular exercise-no    Current Outpatient Prescriptions on File Prior to Visit  Medication Sig Dispense Refill  . Bilberry, Vaccinium myrtillus, (BILBERRY EXTRACT PO) Take by mouth. 1.000mg  once a day       . carvedilol (COREG) 25 MG tablet TAKE ONE TABLET BY MOUTH TWICE DAILY WITH A MEAL.  180 tablet  2  . Multiple Vitamin (MULTIVITAMIN) tablet Take 1 tablet by mouth daily.        . simvastatin (ZOCOR) 40 MG tablet Take 40 mg by mouth at bedtime.        . TRILIPIX 135 MG capsule Take 1 tablet by mouth daily.      . vitamin E 400 UNIT capsule Take 400 Units by mouth daily.        . Ascorbic Acid (VITAMIN C) 500 MG CAPS Take 1  capsule by mouth 2 (two) times daily.         Allergies  Allergen Reactions  . Aspirin   . Penicillins     Family History  Problem Relation Age of Onset  . Other Father 62    unknown cause  . Other Mother 65    unknown cause  . Heart failure Other     maternal grandmother  . Diabetes type II Sister 71  . Hyperlipidemia Other     Parent  . Stroke Other     Parent  . Hypertension Other     Parent  . Diabetes Other     Parent    BP 102/72  Pulse 81  Temp 97.5 F (36.4 C) (Oral)  Ht 5\' 11"  (1.803 m)  Wt 249 lb (112.946 kg)  BMI 34.73 kg/m2  SpO2 92%  Review of Systems denies hypoglycemia    Objective:   Physical Exam VITAL SIGNS:  See vs page GENERAL: no distress   outside test results are reviewed: A1c=7.4    Assessment & Plan:  DM.  He  is ready to transition off orals

## 2012-07-01 NOTE — Patient Instructions (Addendum)
check your blood sugar twice a day.  vary the time of day when you check, between before the 3 meals, and at bedtime.  also check if you have symptoms of your blood sugar being too high or too low.  please keep a record of the readings and bring it to your next appointment here.  please call us sooner if your blood sugar goes below 70, or if it stays over 200.  Increase humalog to 15 units 3x a day (just before each meal) Please come back for a follow-up appointment in 1 month.   Stop glimepiride, bromocriptine, and invokana.

## 2012-07-15 DIAGNOSIS — L97509 Non-pressure chronic ulcer of other part of unspecified foot with unspecified severity: Secondary | ICD-10-CM | POA: Diagnosis not present

## 2012-08-05 ENCOUNTER — Encounter: Payer: Self-pay | Admitting: Endocrinology

## 2012-08-05 ENCOUNTER — Ambulatory Visit (INDEPENDENT_AMBULATORY_CARE_PROVIDER_SITE_OTHER): Payer: Medicare Other | Admitting: Endocrinology

## 2012-08-05 VITALS — BP 120/78 | HR 82 | Temp 97.5°F | Resp 16 | Wt 246.0 lb

## 2012-08-05 DIAGNOSIS — E119 Type 2 diabetes mellitus without complications: Secondary | ICD-10-CM

## 2012-08-05 NOTE — Progress Notes (Signed)
Subjective:    Patient ID: Justin Hill, male    DOB: 11-13-1941, 71 y.o.   MRN: [id number]  HPI pt returns for f/u of type 2 DM (dx'ed 123456; complicated by chf and renal insufficiency).  he has never been on insulin.  He can't take metformin due to renal insuff, and can't take actos due to edema.  pt states he feels well in general.  he brings a record of his cbg's which i have reviewed today.  Most are in the high-100's, despite increasing the humalog again. Past Medical History  Diagnosis Date  . Cardiomyopathy   . Personal history of kidney stones   . Cardiomyopathy   . CHF (congestive heart failure)   . Diabetes mellitus   . Hypercholesterolemia   . Hypertension     Past Surgical History  Procedure Date  . Shoulder surgery 1981    left. Muscle reattachment  . Tonsillectomy     History   Social History  . Marital Status: Married    Spouse Name: N/A    Number of Children: 1  . Years of Education: 12   Occupational History  . retired    Social History Main Topics  . Smoking status: Former Smoker    Types: Cigarettes  . Smokeless tobacco: Not on file  . Alcohol Use: No  . Drug Use: No  . Sexually Active: Not on file   Other Topics Concern  . Not on file   Social History Narrative   Regular exercise-no    Current Outpatient Prescriptions on File Prior to Visit  Medication Sig Dispense Refill  . Ascorbic Acid (VITAMIN C) 500 MG CAPS Take 1 capsule by mouth 2 (two) times daily.       . Bilberry, Vaccinium myrtillus, (BILBERRY EXTRACT PO) Take by mouth. 1.000mg  once a day       . bromocriptine (PARLODEL) 2.5 MG tablet       . carvedilol (COREG) 25 MG tablet TAKE ONE TABLET BY MOUTH TWICE DAILY WITH A MEAL.  180 tablet  2  . Multiple Vitamin (MULTIVITAMIN) tablet Take 1 tablet by mouth daily.        . simvastatin (ZOCOR) 40 MG tablet Take 40 mg by mouth at bedtime.        . TRILIPIX 135 MG capsule Take 1 tablet by mouth daily.      . vitamin E 400 UNIT  capsule Take 400 Units by mouth daily.          Allergies  Allergen Reactions  . Aspirin   . Penicillins     Family History  Problem Relation Age of Onset  . Other Father 93    unknown cause  . Other Mother 23    unknown cause  . Heart failure Other     maternal grandmother  . Diabetes type II Sister 6  . Hyperlipidemia Other     Parent  . Stroke Other     Parent  . Hypertension Other     Parent  . Diabetes Other     Parent    BP 120/78  Pulse 82  Temp 97.5 F (36.4 C) (Oral)  Resp 16  Wt 246 lb (111.585 kg)  SpO2 93%    Review of Systems denies hypoglycemia     Objective:   Physical Exam VITAL SIGNS:  See vs page GENERAL: no distress PSYCH: Alert and oriented x 3.  Does not appear anxious nor depressed.       Assessment &  Plan:  DM: needs increased rx

## 2012-08-05 NOTE — Patient Instructions (Addendum)
check your blood sugar twice a day.  vary the time of day when you check, between before the 3 meals, and at bedtime.  also check if you have symptoms of your blood sugar being too high or too low.  please keep a record of the readings and bring it to your next appointment here.  please call us sooner if your blood sugar goes below 70, or if it stays over 200.  Increase humalog to 20 units 3x a day (just before each meal). Please come back for a follow-up appointment in 1 month.

## 2012-08-23 ENCOUNTER — Telehealth: Payer: Self-pay

## 2012-08-23 NOTE — Telephone Encounter (Signed)
Pt's spouse called stating pt's CBGs have still been in the 23s. Please contact pt.

## 2012-08-23 NOTE — Telephone Encounter (Signed)
Pt.notified

## 2012-08-23 NOTE — Telephone Encounter (Signed)
Increase humalog to 25 units 3 times a day (just before each meal) Ret as scheduled

## 2012-08-23 NOTE — Telephone Encounter (Signed)
Pt wife called and she is stating that every other day or daily she is still getting the 200 as readings. Pt is currently on 67mL 3 times day. Please call back at [phone].

## 2012-08-24 DIAGNOSIS — Z23 Encounter for immunization: Secondary | ICD-10-CM | POA: Diagnosis not present

## 2012-09-09 DIAGNOSIS — L608 Other nail disorders: Secondary | ICD-10-CM | POA: Diagnosis not present

## 2012-09-09 DIAGNOSIS — E1149 Type 2 diabetes mellitus with other diabetic neurological complication: Secondary | ICD-10-CM | POA: Diagnosis not present

## 2012-09-09 DIAGNOSIS — Q828 Other specified congenital malformations of skin: Secondary | ICD-10-CM | POA: Diagnosis not present

## 2012-09-10 ENCOUNTER — Encounter: Payer: Self-pay | Admitting: Endocrinology

## 2012-09-10 ENCOUNTER — Ambulatory Visit (INDEPENDENT_AMBULATORY_CARE_PROVIDER_SITE_OTHER): Payer: Medicare Other | Admitting: Endocrinology

## 2012-09-10 VITALS — BP 124/68 | HR 78 | Temp 97.6°F | Resp 16 | Wt 249.0 lb

## 2012-09-10 DIAGNOSIS — E119 Type 2 diabetes mellitus without complications: Secondary | ICD-10-CM | POA: Diagnosis not present

## 2012-09-10 MED ORDER — INSULIN LISPRO 100 UNIT/ML ~~LOC~~ SOLN: 30.0000 [IU] | Freq: Three times a day (TID) | SUBCUTANEOUS | Status: DC

## 2012-09-10 NOTE — Patient Instructions (Addendum)
check your blood sugar twice a day.  vary the time of day when you check, between before the 3 meals, and at bedtime.  also check if you have symptoms of your blood sugar being too high or too low.  please keep a record of the readings and bring it to your next appointment here.  please call us sooner if your blood sugar goes below 70, or if you have a lot of readings over 200. Increase humalog to 30 units 3x a day (just before each meal).  Please come back for a follow-up appointment in 1 month.

## 2012-09-10 NOTE — Progress Notes (Signed)
  Subjective:    Patient ID: Justin Hill, male    DOB: March 12, 1941, 71 y.o.   MRN: [id number]  HPI pt returns for f/u of type 2 DM (dx'ed 123456; complicated by chf and renal insufficiency; he can't take metformin due to renal insuff, and can't take actos due to edema).  pt states he feels well in general.  he brings a record of his cbg's which i have reviewed today.  It varies from 178-343, despite increasing the humalog to 25 units tid (qac). Past Medical History  Diagnosis Date  . Cardiomyopathy   . Personal history of kidney stones   . Cardiomyopathy   . CHF (congestive heart failure)   . Diabetes mellitus   . Hypercholesterolemia   . Hypertension     Past Surgical History  Procedure Date  . Shoulder surgery 1981    left. Muscle reattachment  . Tonsillectomy     History   Social History  . Marital Status: Married    Spouse Name: N/A    Number of Children: 1  . Years of Education: 12   Occupational History  . retired    Social History Main Topics  . Smoking status: Former Smoker    Types: Cigarettes  . Smokeless tobacco: Not on file  . Alcohol Use: No  . Drug Use: No  . Sexually Active: Not on file   Other Topics Concern  . Not on file   Social History Narrative   Regular exercise-no    Current Outpatient Prescriptions on File Prior to Visit  Medication Sig Dispense Refill  . Ascorbic Acid (VITAMIN C) 500 MG CAPS Take 1 capsule by mouth 2 (two) times daily.       . Bilberry, Vaccinium myrtillus, (BILBERRY EXTRACT PO) Take by mouth. 1.000mg  once a day       . carvedilol (COREG) 25 MG tablet TAKE ONE TABLET BY MOUTH TWICE DAILY WITH A MEAL.  180 tablet  2  . Multiple Vitamin (MULTIVITAMIN) tablet Take 1 tablet by mouth daily.        . simvastatin (ZOCOR) 40 MG tablet Take 40 mg by mouth at bedtime.        . TRILIPIX 135 MG capsule Take 1 tablet by mouth daily.      . vitamin E 400 UNIT capsule Take 400 Units by mouth daily.          Allergies    Allergen Reactions  . Aspirin   . Penicillins     Family History  Problem Relation Age of Onset  . Other Father 20    unknown cause  . Other Mother 96    unknown cause  . Heart failure Other     maternal grandmother  . Diabetes type II Sister 76  . Hyperlipidemia Other     Parent  . Stroke Other     Parent  . Hypertension Other     Parent  . Diabetes Other     Parent    BP 124/68  Pulse 78  Temp 97.6 F (36.4 C) (Oral)  Resp 16  Wt 249 lb (112.946 kg)  SpO2 98%  Review of Systems denies hypoglycemia    Objective:   Physical Exam VITAL SIGNS:  See vs page GENERAL: no distress PSYCH: Alert and oriented x 3.  Does not appear anxious nor depressed.     Assessment & Plan:  DM, needs increased rx

## 2012-09-27 DIAGNOSIS — E039 Hypothyroidism, unspecified: Secondary | ICD-10-CM | POA: Diagnosis not present

## 2012-09-27 DIAGNOSIS — E119 Type 2 diabetes mellitus without complications: Secondary | ICD-10-CM | POA: Diagnosis not present

## 2012-09-27 DIAGNOSIS — Z79899 Other long term (current) drug therapy: Secondary | ICD-10-CM | POA: Diagnosis not present

## 2012-09-28 DIAGNOSIS — E119 Type 2 diabetes mellitus without complications: Secondary | ICD-10-CM | POA: Diagnosis not present

## 2012-09-28 DIAGNOSIS — E785 Hyperlipidemia, unspecified: Secondary | ICD-10-CM | POA: Diagnosis not present

## 2012-10-01 ENCOUNTER — Telehealth: Payer: Self-pay | Admitting: Endocrinology

## 2012-10-01 NOTE — Telephone Encounter (Signed)
Caller: Betty/Spouse; Patient Name: Justin Hill; PCP: Renato Shin (Adults only); Best Callback Phone Number: [phone] Wife states patient is taking Humalog 30 units before meals three times daily. Blood sugar has been >200 after breakfast 11-8. Rechecked at 1330 3 hurs after lunch and was 238. States when runs >200 doctor increases medicine. States will be > 200 every 2 days.  Denies emergent symptoms. Wants to know if doctor wants to increae Humalog. PLEASE CALL AND ADVISE IF NEEDS TO INCREASE HUMALOG

## 2012-10-04 NOTE — Telephone Encounter (Signed)
Please increase to 35 units 3 times a day (just before each meal) Ret as scheduled

## 2012-10-04 NOTE — Telephone Encounter (Signed)
Pt's wife advised and states an understanding of results  

## 2012-10-11 ENCOUNTER — Ambulatory Visit: Payer: Medicare Other | Admitting: Endocrinology

## 2012-10-12 ENCOUNTER — Other Ambulatory Visit: Payer: Self-pay | Admitting: Endocrinology

## 2012-10-12 MED ORDER — INSULIN LISPRO 100 UNIT/ML ~~LOC~~ SOLN: 35.0000 [IU] | Freq: Three times a day (TID) | SUBCUTANEOUS | Status: DC

## 2012-10-18 DIAGNOSIS — L03039 Cellulitis of unspecified toe: Secondary | ICD-10-CM | POA: Diagnosis not present

## 2012-10-18 DIAGNOSIS — E1149 Type 2 diabetes mellitus with other diabetic neurological complication: Secondary | ICD-10-CM | POA: Diagnosis not present

## 2012-10-25 ENCOUNTER — Ambulatory Visit (INDEPENDENT_AMBULATORY_CARE_PROVIDER_SITE_OTHER): Payer: Medicare Other | Admitting: Endocrinology

## 2012-10-25 ENCOUNTER — Encounter: Payer: Self-pay | Admitting: Endocrinology

## 2012-10-25 VITALS — BP 124/84 | HR 64 | Temp 97.9°F | Wt 247.0 lb

## 2012-10-25 DIAGNOSIS — E119 Type 2 diabetes mellitus without complications: Secondary | ICD-10-CM

## 2012-10-25 NOTE — Patient Instructions (Addendum)
check your blood sugar twice a day.  vary the time of day when you check, between before the 3 meals, and at bedtime.  also check if you have symptoms of your blood sugar being too high or too low.  please keep a record of the readings and bring it to your next appointment here.  please call us sooner if your blood sugar goes below 70, or if you have a lot of readings over 200. continue humalog 35 units 3x a day (just before each meal).  Here are a few novolog pens.  See if this burns also.  If not, please call so we can change. Please come back for a follow-up appointment in 2 months.

## 2012-10-25 NOTE — Progress Notes (Signed)
Subjective:    Patient ID: Justin Hill, male    DOB: 1941/01/02, 71 y.o.   MRN: [id number]  HPI pt returns for f/u of type 2 DM (dx'ed 123456; complicated by chf and renal insufficiency; he can't take metformin due to renal insuff, and can't take actos due to edema).  pt states he feels well in general.  no cbg record, but states cbg's are improved to the 100's since increasing the humalog to 35 units tid. (qac).  However, pt says he eats only 2 meals per day.   Pt reports moderate burning at the insulin injection site at the anterior abdomen, in the context of injecting humalog.  No assoc itching. Past Medical History  Diagnosis Date  . Cardiomyopathy   . Personal history of kidney stones   . Cardiomyopathy   . CHF (congestive heart failure)   . Diabetes mellitus   . Hypercholesterolemia   . Hypertension     Past Surgical History  Procedure Date  . Shoulder surgery 1981    left. Muscle reattachment  . Tonsillectomy     History   Social History  . Marital Status: Married    Spouse Name: N/A    Number of Children: 1  . Years of Education: 12   Occupational History  . retired    Social History Main Topics  . Smoking status: Former Smoker    Types: Cigarettes  . Smokeless tobacco: Not on file  . Alcohol Use: No  . Drug Use: No  . Sexually Active: Not on file   Other Topics Concern  . Not on file   Social History Narrative   Regular exercise-no    Current Outpatient Prescriptions on File Prior to Visit  Medication Sig Dispense Refill  . Ascorbic Acid (VITAMIN C) 500 MG CAPS Take 1 capsule by mouth 2 (two) times daily.       . Bilberry, Vaccinium myrtillus, (BILBERRY EXTRACT PO) Take by mouth. 1.000mg  once a day       . carvedilol (COREG) 25 MG tablet TAKE ONE TABLET BY MOUTH TWICE DAILY WITH A MEAL.  180 tablet  2  . insulin lispro (HUMALOG KWIKPEN) 100 UNIT/ML injection Inject 35 Units into the skin 3 (three) times daily before meals.  45 mL  12  . Multiple  Vitamin (MULTIVITAMIN) tablet Take 1 tablet by mouth daily.        . simvastatin (ZOCOR) 40 MG tablet Take 40 mg by mouth at bedtime.        . TRILIPIX 135 MG capsule Take 1 tablet by mouth daily.      . vitamin E 400 UNIT capsule Take 400 Units by mouth daily.          Allergies  Allergen Reactions  . Aspirin   . Penicillins     Family History  Problem Relation Age of Onset  . Other Father 40    unknown cause  . Other Mother 88    unknown cause  . Heart failure Other     maternal grandmother  . Diabetes type II Sister 61  . Hyperlipidemia Other     Parent  . Stroke Other     Parent  . Hypertension Other     Parent  . Diabetes Other     Parent    BP 124/84  Pulse 64  Temp 97.9 F (36.6 C) (Oral)  Wt 247 lb (112.038 kg)  SpO2 94%  Review of Systems denies hypoglycemia and rash  Objective:   Physical Exam VITAL SIGNS:  See vs page GENERAL: no distress SKIN:  Insulin injection sites at the anterior abdomen are normal.       Assessment & Plan:  DM, with improved control Burning, possibly due to insulin.

## 2012-10-28 DIAGNOSIS — L97509 Non-pressure chronic ulcer of other part of unspecified foot with unspecified severity: Secondary | ICD-10-CM | POA: Diagnosis not present

## 2012-10-28 DIAGNOSIS — E1149 Type 2 diabetes mellitus with other diabetic neurological complication: Secondary | ICD-10-CM | POA: Diagnosis not present

## 2012-11-10 DIAGNOSIS — L97509 Non-pressure chronic ulcer of other part of unspecified foot with unspecified severity: Secondary | ICD-10-CM | POA: Diagnosis not present

## 2012-11-10 DIAGNOSIS — E1149 Type 2 diabetes mellitus with other diabetic neurological complication: Secondary | ICD-10-CM | POA: Diagnosis not present

## 2012-11-19 ENCOUNTER — Encounter: Payer: Self-pay | Admitting: Internal Medicine

## 2012-12-09 DIAGNOSIS — L608 Other nail disorders: Secondary | ICD-10-CM | POA: Diagnosis not present

## 2012-12-09 DIAGNOSIS — L97509 Non-pressure chronic ulcer of other part of unspecified foot with unspecified severity: Secondary | ICD-10-CM | POA: Diagnosis not present

## 2012-12-09 DIAGNOSIS — E1149 Type 2 diabetes mellitus with other diabetic neurological complication: Secondary | ICD-10-CM | POA: Diagnosis not present

## 2012-12-13 ENCOUNTER — Ambulatory Visit (INDEPENDENT_AMBULATORY_CARE_PROVIDER_SITE_OTHER): Payer: Medicare Other | Admitting: Cardiology

## 2012-12-13 ENCOUNTER — Ambulatory Visit (HOSPITAL_COMMUNITY): Payer: Medicare Other | Attending: Cardiology

## 2012-12-13 ENCOUNTER — Encounter: Payer: Self-pay | Admitting: Cardiology

## 2012-12-13 VITALS — BP 126/67 | HR 84 | Ht 71.0 in | Wt 246.0 lb

## 2012-12-13 DIAGNOSIS — I428 Other cardiomyopathies: Secondary | ICD-10-CM

## 2012-12-13 DIAGNOSIS — E7800 Pure hypercholesterolemia, unspecified: Secondary | ICD-10-CM

## 2012-12-13 DIAGNOSIS — I10 Essential (primary) hypertension: Secondary | ICD-10-CM | POA: Insufficient documentation

## 2012-12-13 DIAGNOSIS — E119 Type 2 diabetes mellitus without complications: Secondary | ICD-10-CM | POA: Diagnosis not present

## 2012-12-13 DIAGNOSIS — Z8679 Personal history of other diseases of the circulatory system: Secondary | ICD-10-CM

## 2012-12-13 DIAGNOSIS — I509 Heart failure, unspecified: Secondary | ICD-10-CM | POA: Insufficient documentation

## 2012-12-13 DIAGNOSIS — N183 Chronic kidney disease, stage 3 unspecified: Secondary | ICD-10-CM

## 2012-12-13 NOTE — Progress Notes (Signed)
Echocardiogram performed.  

## 2012-12-13 NOTE — Assessment & Plan Note (Signed)
Is followed by Dr. Loanne Drilling.  Has DM and stage 3 CKD.  Will need to be monitored closely. Developed problems on ACE in [place].

## 2012-12-13 NOTE — Assessment & Plan Note (Signed)
Controlled on carvedilol

## 2012-12-13 NOTE — Patient Instructions (Addendum)
Your physician wants you to follow-up in: 4 MONTHS with Dr Loralie Champagne.  You will receive a reminder letter in the mail two months in advance. If you don't receive a letter, please call our office to schedule the follow-up appointment.  Your physician recommends that you continue on your current medications as directed. Please refer to the Current Medication list given to you today.

## 2012-12-13 NOTE — Assessment & Plan Note (Addendum)
He continues to be class I -II symptoms.  Clearly not symptomatic at this point in time.  See prior Lovena Le notes regarding device therapy.  Echo currently pending.  Will suggest follow up with Dr. Aundra Dubin on a long term basis.  On beta blockers.  Had problem with ACE.  Not too excited about other therapies.

## 2012-12-13 NOTE — Assessment & Plan Note (Signed)
Followed by primary care.   

## 2012-12-13 NOTE — Progress Notes (Signed)
HPI:  The patient continues to do well from a cardiac standpoint. We reviewed his echocardiogram today in the office. The long discussion about the various treatment modalities. The patient has previously come off of ACE inhibitors after developing hyperkalemia and renal insufficiency in the setting of volume contraction. He's not would go back on this. He's also had extensive electrophysiologic evaluation the past, specifically around the question of implantable defibrillator placement.  He remains truly asymptomatic from a clinical standpoint. He's had no further symptoms whatsoever. We'll continue to follow him over a long period of time, there has not been much change in his overall status.  Current Outpatient Prescriptions  Medication Sig Dispense Refill  . Ascorbic Acid (VITAMIN C) 500 MG CAPS Take 1 capsule by mouth 2 (two) times daily.       . Bilberry, Vaccinium myrtillus, (BILBERRY EXTRACT PO) Take by mouth. 1.000mg  once a day       . carvedilol (COREG) 25 MG tablet TAKE ONE TABLET BY MOUTH TWICE DAILY WITH A MEAL.  180 tablet  2  . insulin lispro (HUMALOG KWIKPEN) 100 UNIT/ML injection Inject 35 Units into the skin 3 (three) times daily before meals.  45 mL  12  . Multiple Vitamin (MULTIVITAMIN) tablet Take 1 tablet by mouth daily.        . simvastatin (ZOCOR) 40 MG tablet Take 40 mg by mouth at bedtime.        . TRILIPIX 135 MG capsule Take 1 tablet by mouth daily.      . vitamin E 400 UNIT capsule Take 400 Units by mouth daily.        . silver sulfADIAZINE (SILVADENE) 1 % cream Use once a day      . ZETIA 10 MG tablet Take i tablet daiy        Allergies  Allergen Reactions  . Aspirin   . Penicillins     Past Medical History  Diagnosis Date  . Cardiomyopathy   . Personal history of kidney stones   . Cardiomyopathy   . CHF (congestive heart failure)   . Diabetes mellitus   . Hypercholesterolemia   . Hypertension     Past Surgical History  Procedure Date  . Shoulder  surgery 1981    left. Muscle reattachment  . Tonsillectomy     Family History  Problem Relation Age of Onset  . Other Father 36    unknown cause  . Other Mother 6    unknown cause  . Heart failure Other     maternal grandmother  . Diabetes type II Sister 63  . Hyperlipidemia Other     Parent  . Stroke Other     Parent  . Hypertension Other     Parent  . Diabetes Other     Parent    History   Social History  . Marital Status: Married    Spouse Name: N/A    Number of Children: 1  . Years of Education: 12   Occupational History  . retired    Social History Main Topics  . Smoking status: Former Smoker    Types: Cigarettes  . Smokeless tobacco: Not on file  . Alcohol Use: No  . Drug Use: No  . Sexually Active: Not on file   Other Topics Concern  . Not on file   Social History Narrative   Regular exercise-no    ROS: Please see the HPI.  All other systems reviewed and negative.  PHYSICAL EXAM:  BP 126/67  Pulse 84  Ht 5\' 11"  (1.803 m)  Wt 246 lb (111.585 kg)  BMI 34.31 kg/m2  SpO2 94%  General: Well developed, well nourished, in no acute distress. Head:  Normocephalic and atraumatic. Neck: no JVD Lungs: Clear to auscultation and percussion. Heart: Normal S1 and S2.  Paradoxical S2.  No murmur.   Abdomen:  Normal bowel sounds; soft; non tender; no organomegaly Pulses: Pulses normal in all 4 extremities. Extremities: No clubbing or cyanosis. No edema. Neurologic: Alert and oriented x 3.  EKG:  NSR. LBBB.    ASSESSMENT AND PLAN:

## 2012-12-24 DIAGNOSIS — E7800 Pure hypercholesterolemia, unspecified: Secondary | ICD-10-CM | POA: Diagnosis not present

## 2012-12-24 DIAGNOSIS — E119 Type 2 diabetes mellitus without complications: Secondary | ICD-10-CM | POA: Diagnosis not present

## 2012-12-25 HISTORY — PX: TOE SURGERY: SHX1073

## 2012-12-27 DIAGNOSIS — M19079 Primary osteoarthritis, unspecified ankle and foot: Secondary | ICD-10-CM | POA: Diagnosis not present

## 2012-12-27 DIAGNOSIS — L97509 Non-pressure chronic ulcer of other part of unspecified foot with unspecified severity: Secondary | ICD-10-CM | POA: Diagnosis not present

## 2012-12-27 DIAGNOSIS — E1142 Type 2 diabetes mellitus with diabetic polyneuropathy: Secondary | ICD-10-CM | POA: Diagnosis not present

## 2012-12-27 DIAGNOSIS — I10 Essential (primary) hypertension: Secondary | ICD-10-CM | POA: Diagnosis not present

## 2012-12-27 DIAGNOSIS — M2040 Other hammer toe(s) (acquired), unspecified foot: Secondary | ICD-10-CM | POA: Diagnosis not present

## 2012-12-27 DIAGNOSIS — E1149 Type 2 diabetes mellitus with other diabetic neurological complication: Secondary | ICD-10-CM | POA: Diagnosis not present

## 2013-01-06 ENCOUNTER — Ambulatory Visit: Payer: Medicare Other | Admitting: Endocrinology

## 2013-01-08 ENCOUNTER — Other Ambulatory Visit: Payer: Self-pay

## 2013-01-12 ENCOUNTER — Encounter: Payer: Self-pay | Admitting: Endocrinology

## 2013-01-12 ENCOUNTER — Ambulatory Visit (INDEPENDENT_AMBULATORY_CARE_PROVIDER_SITE_OTHER): Payer: Medicare Other | Admitting: Endocrinology

## 2013-01-12 VITALS — BP 128/80 | HR 80 | Wt 250.0 lb

## 2013-01-12 DIAGNOSIS — E119 Type 2 diabetes mellitus without complications: Secondary | ICD-10-CM | POA: Diagnosis not present

## 2013-01-12 MED ORDER — INSULIN LISPRO 100 UNIT/ML ~~LOC~~ SOLN: 35.0000 [IU] | Freq: Three times a day (TID) | SUBCUTANEOUS | Status: DC

## 2013-01-12 NOTE — Patient Instructions (Addendum)
check your blood sugar twice a day.  vary the time of day when you check, between before the 3 meals, and at bedtime.  also check if you have symptoms of your blood sugar being too high or too low.  please keep a record of the readings and bring it to your next appointment here.  please call us sooner if your blood sugar goes below 70, or if you have a lot of readings over 200.   continue humalog 35 units just before each meal.   Take humalog 10 units with any snack, especially at bedtime.   Please come back for a follow-up appointment in 3 months.

## 2013-01-12 NOTE — Progress Notes (Signed)
Subjective:    Patient ID: Justin Hill, male    DOB: Feb 09, 1941, 72 y.o.   MRN: [id number]  HPI pt returns for f/u of type 2 DM (dx'ed 123456; complicated by chf and renal insufficiency; he can't take metformin due to renal insuff, and can't take actos due to edema).  pt states he feels well in general.  he brings a record of his cbg's which i have reviewed today.  It varies from 95-258, but most are in the 100's.  There is no trend throughout the day, except it is highest in am (pt feels this is due to eating at hs).   Past Medical History  Diagnosis Date  . Cardiomyopathy   . Personal history of kidney stones   . Cardiomyopathy   . CHF (congestive heart failure)   . Diabetes mellitus   . Hypercholesterolemia   . Hypertension     Past Surgical History  Procedure Laterality Date  . Shoulder surgery  1981    left. Muscle reattachment  . Tonsillectomy      History   Social History  . Marital Status: Married    Spouse Name: N/A    Number of Children: 1  . Years of Education: 12   Occupational History  . retired    Social History Main Topics  . Smoking status: Former Smoker    Types: Cigarettes  . Smokeless tobacco: Not on file  . Alcohol Use: No  . Drug Use: No  . Sexually Active: Not on file   Other Topics Concern  . Not on file   Social History Narrative   Regular exercise-no          Current Outpatient Prescriptions on File Prior to Visit  Medication Sig Dispense Refill  . Ascorbic Acid (VITAMIN C) 500 MG CAPS Take 1 capsule by mouth 2 (two) times daily.       . Bilberry, Vaccinium myrtillus, (BILBERRY EXTRACT PO) Take by mouth. 1.000mg  once a day       . carvedilol (COREG) 25 MG tablet TAKE ONE TABLET BY MOUTH TWICE DAILY WITH A MEAL.  180 tablet  2  . Multiple Vitamin (MULTIVITAMIN) tablet Take 1 tablet by mouth daily.        . silver sulfADIAZINE (SILVADENE) 1 % cream Use once a day      . simvastatin (ZOCOR) 40 MG tablet Take 40 mg by mouth at  bedtime.        . TRILIPIX 135 MG capsule Take 1 tablet by mouth daily.      . vitamin E 400 UNIT capsule Take 400 Units by mouth daily.        Marland Kitchen ZETIA 10 MG tablet Take i tablet daiy       No current facility-administered medications on file prior to visit.    Allergies  Allergen Reactions  . Aspirin   . Penicillins     Family History  Problem Relation Age of Onset  . Other Father 22    unknown cause  . Other Mother 42    unknown cause  . Heart failure Other     maternal grandmother  . Diabetes type II Sister 42  . Hyperlipidemia Other     Parent  . Stroke Other     Parent  . Hypertension Other     Parent  . Diabetes Other     Parent    BP 128/80  Pulse 80  Wt 250 lb (113.399 kg)  BMI 34.88 kg/m2  SpO2 97%  Review of Systems denies hypoglycemia.      Objective:   Physical Exam VITAL SIGNS:  See vs page GENERAL: no distress Ext: has wooden shoe on the right foot.     outside test results are reviewed: A1c=8.0    Assessment & Plan:  DM: needs increased rx

## 2013-01-13 DIAGNOSIS — M2040 Other hammer toe(s) (acquired), unspecified foot: Secondary | ICD-10-CM | POA: Diagnosis not present

## 2013-02-10 DIAGNOSIS — E1149 Type 2 diabetes mellitus with other diabetic neurological complication: Secondary | ICD-10-CM | POA: Diagnosis not present

## 2013-02-10 DIAGNOSIS — M2040 Other hammer toe(s) (acquired), unspecified foot: Secondary | ICD-10-CM | POA: Diagnosis not present

## 2013-02-10 DIAGNOSIS — L608 Other nail disorders: Secondary | ICD-10-CM | POA: Diagnosis not present

## 2013-04-04 DIAGNOSIS — Z79899 Other long term (current) drug therapy: Secondary | ICD-10-CM | POA: Diagnosis not present

## 2013-04-06 DIAGNOSIS — E119 Type 2 diabetes mellitus without complications: Secondary | ICD-10-CM | POA: Diagnosis not present

## 2013-04-06 DIAGNOSIS — Z0000 Encounter for general adult medical examination without abnormal findings: Secondary | ICD-10-CM | POA: Diagnosis not present

## 2013-04-06 DIAGNOSIS — E785 Hyperlipidemia, unspecified: Secondary | ICD-10-CM | POA: Diagnosis not present

## 2013-04-14 ENCOUNTER — Ambulatory Visit (INDEPENDENT_AMBULATORY_CARE_PROVIDER_SITE_OTHER): Payer: Medicare Other | Admitting: Endocrinology

## 2013-04-14 ENCOUNTER — Encounter: Payer: Self-pay | Admitting: Endocrinology

## 2013-04-14 VITALS — BP 126/72 | HR 75 | Ht 71.0 in | Wt 250.0 lb

## 2013-04-14 DIAGNOSIS — E1029 Type 1 diabetes mellitus with other diabetic kidney complication: Secondary | ICD-10-CM

## 2013-04-14 DIAGNOSIS — E119 Type 2 diabetes mellitus without complications: Secondary | ICD-10-CM

## 2013-04-14 MED ORDER — INSULIN NPH (HUMAN) (ISOPHANE) 100 UNIT/ML ~~LOC~~ SUSP: 10.0000 [IU] | Freq: Every day | SUBCUTANEOUS | Status: DC

## 2013-04-14 MED ORDER — INSULIN REGULAR HUMAN 100 UNIT/ML IJ SOLN: 35.0000 [IU] | Freq: Three times a day (TID) | INTRAMUSCULAR | Status: DC

## 2013-04-14 MED ORDER — "INSULIN SYRINGE-NEEDLE U-100 31G X 5/16"" 0.5 ML MISC": 1.0000 | Freq: Four times a day (QID) | Status: DC

## 2013-04-14 NOTE — Progress Notes (Signed)
Subjective:    Patient ID: Justin Hill, male    DOB: 1941-07-02, 72 y.o.   MRN: [id number]  HPI pt returns for f/u of type 2 DM (dx'ed 123456; complicated by chf and renal insufficiency).  pt states he feels well in general.  he brings a record of his cbg's which i have reviewed today.  It varies from 93-200.  It is highest in am.  If he eats a bedtime snack, he takes humalog with it.   Past Medical History  Diagnosis Date  . Cardiomyopathy   . Personal history of kidney stones   . Cardiomyopathy   . CHF (congestive heart failure)   . Diabetes mellitus   . Hypercholesterolemia   . Hypertension     Past Surgical History  Procedure Laterality Date  . Shoulder surgery  1981    left. Muscle reattachment  . Tonsillectomy      History   Social History  . Marital Status: Married    Spouse Name: N/A    Number of Children: 1  . Years of Education: 12   Occupational History  . retired    Social History Main Topics  . Smoking status: Former Smoker    Types: Cigarettes  . Smokeless tobacco: Not on file  . Alcohol Use: No  . Drug Use: No  . Sexually Active: Not on file   Other Topics Concern  . Not on file   Social History Narrative   Regular exercise-no          Current Outpatient Prescriptions on File Prior to Visit  Medication Sig Dispense Refill  . Ascorbic Acid (VITAMIN C) 500 MG CAPS Take 1 capsule by mouth 2 (two) times daily.       . Bilberry, Vaccinium myrtillus, (BILBERRY EXTRACT PO) Take by mouth. 1.000mg  once a day       . carvedilol (COREG) 25 MG tablet TAKE ONE TABLET BY MOUTH TWICE DAILY WITH A MEAL.  180 tablet  2  . Multiple Vitamin (MULTIVITAMIN) tablet Take 1 tablet by mouth daily.        . silver sulfADIAZINE (SILVADENE) 1 % cream Use once a day      . simvastatin (ZOCOR) 40 MG tablet Take 40 mg by mouth at bedtime.        . TRILIPIX 135 MG capsule Take 1 tablet by mouth daily.      . vitamin E 400 UNIT capsule Take 400 Units by mouth daily.         Marland Kitchen ZETIA 10 MG tablet Take i tablet daiy       No current facility-administered medications on file prior to visit.    Allergies  Allergen Reactions  . Aspirin   . Penicillins     Family History  Problem Relation Age of Onset  . Other Father 86    unknown cause  . Other Mother 45    unknown cause  . Heart failure Other     maternal grandmother  . Diabetes type II Sister 23  . Hyperlipidemia Other     Parent  . Stroke Other     Parent  . Hypertension Other     Parent  . Diabetes Other     Parent    BP 126/72  Pulse 75  Ht 5\' 11"  (1.803 m)  Wt 250 lb (113.399 kg)  BMI 34.88 kg/m2  SpO2 98%  Review of Systems denies hypoglycemia and weight change    Objective:   Physical Exam  VITAL SIGNS:  See vs page GENERAL: no distress   outside test results are reviewed: A1c=8.3    Assessment & Plan:  DM: The pattern of his cbg's indicates he needs overnight insulin.  This insulin regimen was chosen from multiple options, as it best matches his insulin to his changing requirements throughout the day.

## 2013-04-14 NOTE — Patient Instructions (Addendum)
check your blood sugar twice a day.  vary the time of day when you check, between before the 3 meals, and at bedtime.  also check if you have symptoms of your blood sugar being too high or too low.  please keep a record of the readings and bring it to your next appointment here.  please call us sooner if your blood sugar goes below 70, or if you have a lot of readings over 200.   change humalog to regular insulin, 35 units just before each meal.  Also take 10 units with any snack, especially at bedtime.   Please come back for a follow-up appointment in 3 months.   Please add NPH insulin, 10 units at bedtime.

## 2013-05-19 DIAGNOSIS — E1149 Type 2 diabetes mellitus with other diabetic neurological complication: Secondary | ICD-10-CM | POA: Diagnosis not present

## 2013-05-19 DIAGNOSIS — L608 Other nail disorders: Secondary | ICD-10-CM | POA: Diagnosis not present

## 2013-06-29 ENCOUNTER — Other Ambulatory Visit: Payer: Self-pay

## 2013-07-14 ENCOUNTER — Ambulatory Visit: Payer: Medicare Other | Admitting: Endocrinology

## 2013-07-22 ENCOUNTER — Encounter: Payer: Self-pay | Admitting: Internal Medicine

## 2013-08-18 DIAGNOSIS — L608 Other nail disorders: Secondary | ICD-10-CM | POA: Diagnosis not present

## 2013-08-18 DIAGNOSIS — E1149 Type 2 diabetes mellitus with other diabetic neurological complication: Secondary | ICD-10-CM | POA: Diagnosis not present

## 2013-08-24 DIAGNOSIS — R350 Frequency of micturition: Secondary | ICD-10-CM | POA: Diagnosis not present

## 2013-08-24 DIAGNOSIS — E7800 Pure hypercholesterolemia, unspecified: Secondary | ICD-10-CM | POA: Diagnosis not present

## 2013-08-24 DIAGNOSIS — E119 Type 2 diabetes mellitus without complications: Secondary | ICD-10-CM | POA: Diagnosis not present

## 2013-08-30 DIAGNOSIS — Z23 Encounter for immunization: Secondary | ICD-10-CM | POA: Diagnosis not present

## 2013-08-30 DIAGNOSIS — I10 Essential (primary) hypertension: Secondary | ICD-10-CM | POA: Diagnosis not present

## 2013-08-30 DIAGNOSIS — E119 Type 2 diabetes mellitus without complications: Secondary | ICD-10-CM | POA: Diagnosis not present

## 2013-08-30 DIAGNOSIS — E785 Hyperlipidemia, unspecified: Secondary | ICD-10-CM | POA: Diagnosis not present

## 2013-09-07 ENCOUNTER — Ambulatory Visit: Payer: Medicare Other | Admitting: Endocrinology

## 2013-09-12 ENCOUNTER — Other Ambulatory Visit: Payer: Self-pay | Admitting: Cardiology

## 2013-09-14 ENCOUNTER — Ambulatory Visit: Payer: Medicare Other | Admitting: Endocrinology

## 2013-09-16 ENCOUNTER — Encounter: Payer: Self-pay | Admitting: Endocrinology

## 2013-09-16 ENCOUNTER — Ambulatory Visit (INDEPENDENT_AMBULATORY_CARE_PROVIDER_SITE_OTHER): Payer: Medicare Other | Admitting: Endocrinology

## 2013-09-16 VITALS — BP 132/80 | HR 90 | Wt 253.0 lb

## 2013-09-16 DIAGNOSIS — E119 Type 2 diabetes mellitus without complications: Secondary | ICD-10-CM

## 2013-09-16 NOTE — Patient Instructions (Signed)
blood tests are being requested for you today.  We'll contact you with results. If it is high again, we'll change to NPH only, twice a day. check your blood sugar twice a day.  vary the time of day when you check, between before the 3 meals, and at bedtime.  also check if you have symptoms of your blood sugar being too high or too low.  please keep a record of the readings and bring it to your next appointment here.  please call us sooner if your blood sugar goes below 70, or if you have a lot of readings over 200.   Please come back for a follow-up appointment in 3 months.

## 2013-09-16 NOTE — Progress Notes (Signed)
Subjective:    Patient ID: Justin Hill, male    DOB: 22-Aug-1941, 72 y.o.   MRN: [id number]  HPI pt returns for f/u of type 2 DM (dx'ed 2003, on a routine blood test; He has mild if any neuropathy of the lower extremities, but he has associated by CHF and renal insufficiency; therapy has been limited by pt's request for least expensive meds; he has never had severe hypoglycemia or DKA).  he brings a record of his cbg's which i have reviewed today.  It is lowest at lunch (90).  It is highest at hs and in am (mid-100's).  However, it is extremely variable.  He takes reg only twice a day, because he only eats 2 meals a day.    Past Medical History  Diagnosis Date  . Cardiomyopathy   . Personal history of kidney stones   . Cardiomyopathy   . CHF (congestive heart failure)   . Diabetes mellitus   . Hypercholesterolemia   . Hypertension     Past Surgical History  Procedure Laterality Date  . Shoulder surgery  1981    left. Muscle reattachment  . Tonsillectomy      History   Social History  . Marital Status: Married    Spouse Name: N/A    Number of Children: 1  . Years of Education: 12   Occupational History  . retired    Social History Main Topics  . Smoking status: Former Smoker    Types: Cigarettes  . Smokeless tobacco: Not on file  . Alcohol Use: No  . Drug Use: No  . Sexual Activity: Not on file   Other Topics Concern  . Not on file   Social History Narrative   Regular exercise-no          Current Outpatient Prescriptions on File Prior to Visit  Medication Sig Dispense Refill  . Ascorbic Acid (VITAMIN C) 500 MG CAPS Take 1 capsule by mouth 2 (two) times daily.       . Bilberry, Vaccinium myrtillus, (BILBERRY EXTRACT PO) Take by mouth. 1.000mg  once a day       . carvedilol (COREG) 25 MG tablet TAKE ONE TABLET BY MOUTH TWICE DAILY WITH  A  MEAL  180 tablet  0  . Insulin Syringe-Needle U-100 31G X 5/16" 0.5 ML MISC 1 Syringe by Does not apply route 4 (four)  times daily.  120 each  11  . Multiple Vitamin (MULTIVITAMIN) tablet Take 1 tablet by mouth daily.        . silver sulfADIAZINE (SILVADENE) 1 % cream Use once a day      . simvastatin (ZOCOR) 40 MG tablet Take 40 mg by mouth at bedtime.        . TRILIPIX 135 MG capsule Take 1 tablet by mouth daily.      . vitamin E 400 UNIT capsule Take 400 Units by mouth daily.        Marland Kitchen ZETIA 10 MG tablet Take i tablet daiy       No current facility-administered medications on file prior to visit.    Allergies  Allergen Reactions  . Aspirin   . Penicillins     Family History  Problem Relation Age of Onset  . Other Father 29    unknown cause  . Other Mother 89    unknown cause  . Heart failure Other     maternal grandmother  . Diabetes type II Sister 68  . Hyperlipidemia Other  Parent  . Stroke Other     Parent  . Hypertension Other     Parent  . Diabetes Other     Parent    BP 132/80  Pulse 90  Wt 253 lb (114.76 kg)  BMI 35.3 kg/m2  SpO2 98%  Review of Systems denies hypoglycemia.  He has gained weight.      Objective:   Physical Exam VITAL SIGNS:  See vs page GENERAL: no distress    outside test results are reviewed: A1c=9.7  Today: Lab Results  Component Value Date   HGBA1C 8.3* 09/16/2013      Assessment & Plan:  DM: a1c is much higher than would be suggested by cbg record.  He needs a simpler regimen.   Renal insufficiency: this increases the duration of action of insulin.

## 2013-09-21 ENCOUNTER — Encounter: Payer: Self-pay | Admitting: Internal Medicine

## 2013-09-28 ENCOUNTER — Other Ambulatory Visit: Payer: Self-pay

## 2013-09-28 ENCOUNTER — Telehealth: Payer: Self-pay

## 2013-09-28 MED ORDER — INSULIN NPH (HUMAN) (ISOPHANE) 100 UNIT/ML ~~LOC~~ SUSP: 60.0000 [IU] | Freq: Two times a day (BID) | SUBCUTANEOUS | Status: DC

## 2013-09-28 NOTE — Telephone Encounter (Signed)
Pt left voicemail would like to get results would like to know if med needs to be changed [phone]

## 2013-09-28 NOTE — Telephone Encounter (Signed)
Pt's wife advised.

## 2013-09-28 NOTE — Telephone Encounter (Signed)
This a1c blood test is better than the other one, but still high. Please stop the regular insulin, and increase the NPH (cloudy) to 60 units each morning, and 20 units in the evening. Please come back for a follow-up appointment in 3 months. This number of units is a guess as to how much you would need. Please call if your blood sugar is less than 70, or above 200.

## 2013-09-29 ENCOUNTER — Other Ambulatory Visit: Payer: Self-pay

## 2013-10-14 ENCOUNTER — Telehealth: Payer: Self-pay | Admitting: *Deleted

## 2013-10-14 NOTE — Telephone Encounter (Signed)
[name] asked about pt's paperwork for diabetic shoes.  Left this phone number [phone].

## 2013-10-27 DIAGNOSIS — E119 Type 2 diabetes mellitus without complications: Secondary | ICD-10-CM | POA: Diagnosis not present

## 2013-11-10 ENCOUNTER — Ambulatory Visit (INDEPENDENT_AMBULATORY_CARE_PROVIDER_SITE_OTHER): Payer: Medicare Other

## 2013-11-10 VITALS — BP 123/62 | HR 85 | Resp 18

## 2013-11-10 DIAGNOSIS — E1140 Type 2 diabetes mellitus with diabetic neuropathy, unspecified: Secondary | ICD-10-CM

## 2013-11-10 DIAGNOSIS — E1142 Type 2 diabetes mellitus with diabetic polyneuropathy: Secondary | ICD-10-CM

## 2013-11-10 DIAGNOSIS — E1149 Type 2 diabetes mellitus with other diabetic neurological complication: Secondary | ICD-10-CM

## 2013-11-10 DIAGNOSIS — L608 Other nail disorders: Secondary | ICD-10-CM | POA: Diagnosis not present

## 2013-11-10 NOTE — Patient Instructions (Signed)
Diabetes and Foot Care Diabetes may cause you to have problems because of poor blood supply (circulation) to your feet and legs. This may cause the skin on your feet to become thinner, break easier, and heal more slowly. Your skin may become dry, and the skin may peel and crack. You may also have nerve damage in your legs and feet causing decreased feeling in them. You may not notice minor injuries to your feet that could lead to infections or more serious problems. Taking care of your feet is one of the most important things you can do for yourself.  HOME CARE INSTRUCTIONS  Wear shoes at all times, even in the house. Do not go barefoot. Bare feet are easily injured.  Check your feet daily for blisters, cuts, and redness. If you cannot see the bottom of your feet, use a mirror or ask someone for help.  Wash your feet with warm water (do not use hot water) and mild soap. Then pat your feet and the areas between your toes until they are completely dry. Do not soak your feet as this can dry your skin.  Apply a moisturizing lotion or petroleum jelly (that does not contain alcohol and is unscented) to the skin on your feet and to dry, brittle toenails. Do not apply lotion between your toes.  Trim your toenails straight across. Do not dig under them or around the cuticle. File the edges of your nails with an emery board or nail file.  Do not cut corns or calluses or try to remove them with medicine.  Wear clean socks or stockings every day. Make sure they are not too tight. Do not wear knee-high stockings since they may decrease blood flow to your legs.  Wear shoes that fit properly and have enough cushioning. To break in new shoes, wear them for just a few hours a day. This prevents you from injuring your feet. Always look in your shoes before you put them on to be sure there are no objects inside.  Do not cross your legs. This may decrease the blood flow to your feet.  If you find a minor scrape,  cut, or break in the skin on your feet, keep it and the skin around it clean and dry. These areas may be cleansed with mild soap and water. Do not cleanse the area with peroxide, alcohol, or iodine.  When you remove an adhesive bandage, be sure not to damage the skin around it.  If you have a wound, look at it several times a day to make sure it is healing.  Do not use heating pads or hot water bottles. They may burn your skin. If you have lost feeling in your feet or legs, you may not know it is happening until it is too late.  Make sure your health care provider performs a complete foot exam at least annually or more often if you have foot problems. Report any cuts, sores, or bruises to your health care provider immediately. SEEK MEDICAL CARE IF:   You have an injury that is not healing.  You have cuts or breaks in the skin.  You have an ingrown nail.  You notice redness on your legs or feet.  You feel burning or tingling in your legs or feet.  You have pain or cramps in your legs and feet.  Your legs or feet are numb.  Your feet always feel cold. SEEK IMMEDIATE MEDICAL CARE IF:   There is increasing redness,   swelling, or pain in or around a wound.  There is a red line that goes up your leg.  Pus is coming from a wound.  You develop a fever or as directed by your health care provider.  You notice a bad smell coming from an ulcer or wound. Document Released: 11/07/2000 Document Revised: 07/13/2013 Document Reviewed: 04/19/2013 Exit[employer] Patient Information 2014 [employer].  

## 2013-11-10 NOTE — Progress Notes (Signed)
   Subjective:    Patient ID: Justin Hill, male    DOB: December 22, 1940, 72 y.o.   MRN: [id number]  HPI cut my nails    Review of Systems no new changes or finding     Objective:   Physical Exam Vascular status appears to be intact pedal pulses palpable DP and PT plus one over 4 bilateral Refill timed 3-4 seconds all digits skin temperature warm turgor normal there is no edema rubor pallor mild varicosities noted neurologically epicritic and proprioceptive sensations diminished on Semmes Weinstein testing to the digits plantar forefoot and arch. Orthopedic exam unremarkable noncontributory mild digital contractures noted 2 through 5 bilateral. Patient had crease indications second toe right due to infection this time thick brittle crumbly friable criptotic nails are noted no secondary infection is no discharge or drainage noted no open wounds or ulcers noted       Assessment & Plan:  Assessment this time diabetes with complications including peripheral neuropathy as well as digital deformities hammertoe. At this time debridement of thick painful mycotic mycotic nails 1 through 5 left 13 and 5 right return for followup for future palliative nail care and as-needed basis suggest a 3 month followup  Harriet Masson DPM

## 2013-11-14 DIAGNOSIS — T7840XA Allergy, unspecified, initial encounter: Secondary | ICD-10-CM | POA: Diagnosis not present

## 2013-11-14 DIAGNOSIS — H01009 Unspecified blepharitis unspecified eye, unspecified eyelid: Secondary | ICD-10-CM | POA: Diagnosis not present

## 2013-11-14 DIAGNOSIS — D1739 Benign lipomatous neoplasm of skin and subcutaneous tissue of other sites: Secondary | ICD-10-CM | POA: Diagnosis not present

## 2013-11-15 ENCOUNTER — Telehealth: Payer: Self-pay | Admitting: *Deleted

## 2013-11-15 NOTE — Telephone Encounter (Signed)
Dr Olevia Perches: pt is scheduled for recall colonoscopy 12/06/13 at [facility].  He is scheduled for PV Tues 12/30.  Last colonoscopy 2003.  Pt has complicated medical hx and also has EF of 25% in Jan. 2014. (ASA IV).  Do you want this pt to have an OV with you before scheduling hospital procedure?  Thanks, Juliann Pulse in [facility]

## 2013-11-15 NOTE — Telephone Encounter (Signed)
Will need OV.before colon- EF 25-30% on Echo 11/2012

## 2013-11-16 ENCOUNTER — Encounter: Payer: Self-pay | Admitting: *Deleted

## 2013-11-16 NOTE — Telephone Encounter (Signed)
Line busy. Will try again later.

## 2013-11-28 ENCOUNTER — Encounter: Payer: Self-pay | Admitting: Internal Medicine

## 2013-11-28 ENCOUNTER — Ambulatory Visit (INDEPENDENT_AMBULATORY_CARE_PROVIDER_SITE_OTHER): Payer: Self-pay | Admitting: Internal Medicine

## 2013-11-28 VITALS — BP 140/70 | HR 66 | Ht 71.0 in | Wt 254.4 lb

## 2013-11-28 DIAGNOSIS — E119 Type 2 diabetes mellitus without complications: Secondary | ICD-10-CM | POA: Diagnosis not present

## 2013-11-28 DIAGNOSIS — E7800 Pure hypercholesterolemia, unspecified: Secondary | ICD-10-CM | POA: Diagnosis not present

## 2013-11-28 DIAGNOSIS — I420 Dilated cardiomyopathy: Secondary | ICD-10-CM

## 2013-11-28 DIAGNOSIS — Z1211 Encounter for screening for malignant neoplasm of colon: Secondary | ICD-10-CM

## 2013-11-28 DIAGNOSIS — I428 Other cardiomyopathies: Secondary | ICD-10-CM

## 2013-11-28 MED ORDER — MOVIPREP 100 G PO SOLR: 1.0000 | Freq: Once | ORAL | Status: DC

## 2013-11-28 NOTE — Progress Notes (Signed)
Justin Hill 10-30-1941 [id number]   History of Present Illness:  This is a 73 year-old white male who is here to discuss screening colonoscopy. His last exam was in 2003 and it  was  normal . 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 Lia Foyer. His last appointment was in January 2014. A echocardiogram showed an ejection fraction of 25-30% whichwas stable and he was a class I. He denies chest pain or shortness of breath. He has an upcoming appointment with Dr. Aundra Dubin for January 29th. He was scheduled for a direct colonoscopy for January 13 but this was canceled pending office visit to determine the risks and to reschedule him for the hospital procedure..    Past Medical History  Diagnosis Date  . Cardiomyopathy   . Personal history of kidney stones   . Cardiomyopathy   . CHF (congestive heart failure)   . Diabetes mellitus   . Hypercholesterolemia   . Hypertension   . Diverticulosis     Past Surgical History  Procedure Laterality Date  . Shoulder surgery  1981    left. Muscle reattachment  . Tonsillectomy    . Toe surgery Right 12/2012    2nd toe    Allergies  Allergen Reactions  . Aspirin   . Penicillins     Family history and social history have been reviewed.  Review of Systems:   The remainder of the 10 point ROS is negative except as outlined in the H&P  Physical Exam: General Appearance Well developed, in no distress  Eyes  Non icteric  HEENT  Non traumatic, normocephalic  Mouth No lesion, tongue papillated, no cheilosis Neck Supple without adenopathy, thyroid not enlarged, no carotid bruits, thick neck. JV's not visible Lungs Clear to auscultation bilaterally COR Normal S1, normal S2, regular rhythm, no murmur, quiet precordium Abdomen protuberant soft and nontender with normoactive bowel sounds Rectal not done Extremities  trace pedal edema Skin No lesions Neurological Alert and oriented x 3 Psychological Normal  mood and affect  Assessment and Plan:   Problem #1 Patient is due for a screening colonoscopy. He has cardiomyopathy and an ejection fraction of 25-30%. He has an upcoming appointment with his new cardiologist Dr Aundra Dubin on 12/22/2013. We will tentatively schedule his colonoscopy after his visit with Dr Aundra Dubin, at around  early February , 2015. as a hospital procedure with conscious sedation. We will review Dr. Claris Gladden note prior to that appointment.    [name] 11/28/2013

## 2013-11-28 NOTE — Patient Instructions (Addendum)
You have been scheduled for a colonoscopy with propofol. Please follow written instructions given to you at your visit today.  Please pick up your prep kit at the pharmacy within the next 1-3 days. If you use inhalers (even only as needed), please bring them with you on the day of your procedure. Your physician has requested that you go to www.startemmi.com and enter the access code given to you at your visit today. This web site gives a general overview about your procedure. However, you should still follow specific instructions given to you by our office regarding your preparation for the procedure.  _ x _   INSULIN (LONG ACTING) MEDICATION INSTRUCTIONS (Lantus, NPH, 70/30, Humulin, Novolin-N)   The day before your procedure:  Take  your regular evening dose    The day of your procedure:  Do not take your morning dose  CC: Dr Humphrey Rolls, Dr Cordelia Pen, Dr Loralie Champagne

## 2013-12-01 DIAGNOSIS — E119 Type 2 diabetes mellitus without complications: Secondary | ICD-10-CM | POA: Diagnosis not present

## 2013-12-01 DIAGNOSIS — E785 Hyperlipidemia, unspecified: Secondary | ICD-10-CM | POA: Diagnosis not present

## 2013-12-06 ENCOUNTER — Encounter: Payer: Medicare Other | Admitting: Internal Medicine

## 2013-12-09 ENCOUNTER — Encounter: Payer: Self-pay | Admitting: *Deleted

## 2013-12-14 ENCOUNTER — Encounter: Payer: Self-pay | Admitting: Endocrinology

## 2013-12-21 ENCOUNTER — Telehealth: Payer: Self-pay | Admitting: *Deleted

## 2013-12-21 NOTE — Telephone Encounter (Signed)
Pt's wife states she's called [place] office 3 times, but did not leave a message.  Please call.

## 2013-12-22 ENCOUNTER — Encounter: Payer: Self-pay | Admitting: Cardiology

## 2013-12-22 ENCOUNTER — Ambulatory Visit (INDEPENDENT_AMBULATORY_CARE_PROVIDER_SITE_OTHER): Payer: Medicare Other | Admitting: Cardiology

## 2013-12-22 VITALS — BP 140/70 | HR 82 | Ht 71.0 in | Wt 257.8 lb

## 2013-12-22 DIAGNOSIS — I428 Other cardiomyopathies: Secondary | ICD-10-CM | POA: Diagnosis not present

## 2013-12-22 DIAGNOSIS — I10 Essential (primary) hypertension: Secondary | ICD-10-CM | POA: Diagnosis not present

## 2013-12-22 DIAGNOSIS — E7800 Pure hypercholesterolemia, unspecified: Secondary | ICD-10-CM

## 2013-12-22 DIAGNOSIS — N289 Disorder of kidney and ureter, unspecified: Secondary | ICD-10-CM

## 2013-12-22 NOTE — Patient Instructions (Signed)
Your physician wants you to follow-up in: 6 months with Dr Aundra Dubin. (July 2015). You will receive a reminder letter in the mail two months in advance. If you don't receive a letter, please call our office to schedule the follow-up appointment.   Your physician has recommended that you have a cardiopulmonary stress test (CPX). CPX testing is a non-invasive measurement of heart and lung function. It replaces a traditional treadmill stress test. This type of test provides a tremendous amount of information that relates not only to your present condition but also for future outcomes. This test combines measurements of you ventilation, respiratory gas exchange in the lungs, electrocardiogram (EKG), blood pressure and physical response before, during, and following an exercise protocol.  Let Dr Aundra Dubin know if you decide to have the CPX stress test he recommended. Desiree Lucy, RN [phone]

## 2013-12-22 NOTE — Progress Notes (Signed)
Patient ID: Justin Hill, male   DOB: 07-04-1941, 73 y.o.   MRN: [id number] PCP: Dr. Humphrey Rolls ([facility])  73 yo with history CKD, HTN, diabetes, chronic LBBB, and nonischemic cardiomyopathy presents for cardiology followup.  He has been seen by Dr. Lia Foyer in the past and is seen by me for the first time today.  His cardiomyopathy has been known for years.  Most recent echo in 1/14 showed EF 25-30%, which is stable.  He had a LHC in 2008 showing mild nonobstructive CAD.  An ICD was considered but decided against in the past given NYHA class I symptoms.  He is only on Coreg as he had hyperkalemia/AKI with ACEI use.    Currently, patient is symptomatically stable. He denies exertional dyspnea.  He can walk up stairs and do yardwork with no problems.  No orthopnea/PND.  No palpitations.  No chest pain.  He does some walking with his wife.    Labs (5/14): K 4, creatinine 1.4, LFTs normal, LDL 46, HDL 37 Labs (1/15): K 4.4, creatinine 1.46, LDL 74, HDL 35  ECG: NSR, LBBB  PMH: 1. Type II diabetes 2. CKD 3. Hyperlipidemia 4. HTN 5. Nonischemic cardiomyopathy: This was diagnosed prior to 2008.  In 2008, patient had LHC with only mild nonobstructive coronary disease.  Most recent echo in 1/14 showed EF 25-30% with normal RV.  Patient has not tolerated ACEI due to hyperkalemia and AKI. ICD was discussed (Dr Lovena Le) and decided against given NYHA class I symptoms.  6. Chronic LBBB 7. Obesity  SH: Lives in [place], married, prior smoker, no ETOH.  1 child.   FH: Grandmother with CHF.  Mother with CVA.   ROS: All systems reviewed and negative except as per HPI.    Current Outpatient Prescriptions  Medication Sig Dispense Refill  . Ascorbic Acid (VITAMIN C) 500 MG CAPS Take 1 capsule by mouth 2 (two) times daily.       . Bilberry, Vaccinium myrtillus, (BILBERRY EXTRACT PO) Take by mouth. 1.068m once a day       . carvedilol (COREG) 25 MG tablet TAKE ONE TABLET BY MOUTH TWICE DAILY WITH  A  MEAL   180 tablet  0  . insulin NPH (HUMULIN N,NOVOLIN N) 100 UNIT/ML injection Inject 60 Units into the skin 2 (two) times daily at 8 am and 10 pm. 60 units each morning, and 20 units in the evening  1 vial  6  . Insulin Syringe-Needle U-100 31G X 5/16" 0.5 ML MISC 1 Syringe by Does not apply route 4 (four) times daily.  120 each  11  . MOVIPREP 100 G SOLR Take 1 kit (200 g total) by mouth once.  1 kit  0  . Multiple Vitamin (MULTIVITAMIN) tablet Take 1 tablet by mouth daily.        . silver sulfADIAZINE (SILVADENE) 1 % cream Use once a day      . simvastatin (ZOCOR) 40 MG tablet Take 40 mg by mouth at bedtime.        . TRILIPIX 135 MG capsule Take 1 tablet by mouth daily.      . vitamin E 400 UNIT capsule Take 400 Units by mouth daily.        .Marland KitchenZETIA 10 MG tablet Take i tablet daiy       No current facility-administered medications for this visit.    BP 140/70  Pulse 82  Ht _0  (1.803 m)  Wt 116.937 kg (257 lb 12.8 oz)  BMI 35.97 kg/m2 General: NAD, obese Neck: No JVD, no thyromegaly or thyroid nodule.  Lungs: Clear to auscultation bilaterally with normal respiratory effort. CV: Nondisplaced PMI.  Heart regular S1/S2, no S3/S4, no murmur.  No peripheral edema.  No carotid bruit.  Normal pedal pulses.  Abdomen: Soft, nontender, no hepatosplenomegaly, no distention.  Skin: Intact without lesions or rashes.  Neurologic: Alert and oriented x 3.  Psych: Normal affect. Extremities: No clubbing or cyanosis.   Assessment/Plan: 1. Cardiomyopathy: Nonischemic.  Low EF x years. Most recent echo in 1/14 showed EF 25-30%, which is stable.  By description, symptoms are NYHA class I.  Therefore, he does not qualify for ICD.  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  Interestingly, it also appears that he has a long-standing LBBB.  A chronic LBBB itself can potentially cause a cardiomyopathy and may be the source of his low EF.   - Continue  Coreg at current dose. - I suggested that he do a cardiopulmonary exercise test to show [place] if he really is NYHA class I or if his functional capacity is really worse than he realizes.  If were to do poorly on the CPX, I think that it would be reasonable to pursue CRT-D.  He has a wide LBBB, and his EF would be reasonably likely to improve with CRT.  He is not sure he wants to do this, so I asked him to think about it and get back to me.  - I think that it would be reasonable to try again to introduce a low dose of ACEI in the future.  Will not do it today.  2. Hyperlipidemia: Patient is on simvastatin with good lipids in 5/14.  3. CKD: This has remained stable, most recent creatinine 1.46.   Loralie Champagne 12/22/2013

## 2013-12-22 NOTE — Telephone Encounter (Signed)
Called patients wife she asked that we change the request for diabetic shoe authorization to Dr Renato Shin as he is the Dr treating his diabetes not his PCP.  She stated that they have an appointment with him on Friday 12/23/13 and would like to take it with them to be signed.  Told her I would be happy to make that change and they can come by the office any time to pick it up.  Emmani Lesueur [facility]lee [facility] CNA

## 2013-12-23 ENCOUNTER — Encounter: Payer: Self-pay | Admitting: Endocrinology

## 2013-12-23 ENCOUNTER — Ambulatory Visit (INDEPENDENT_AMBULATORY_CARE_PROVIDER_SITE_OTHER): Payer: Medicare Other | Admitting: Endocrinology

## 2013-12-23 VITALS — BP 130/80 | HR 87 | Temp 97.7°F | Ht 71.0 in | Wt 257.0 lb

## 2013-12-23 DIAGNOSIS — E1029 Type 1 diabetes mellitus with other diabetic kidney complication: Secondary | ICD-10-CM | POA: Diagnosis not present

## 2013-12-23 MED ORDER — INSULIN NPH (HUMAN) (ISOPHANE) 100 UNIT/ML ~~LOC~~ SUSP: SUBCUTANEOUS | Status: DC

## 2013-12-23 NOTE — Patient Instructions (Signed)
Please change the insulin to 65 units each morning, and 15 units in the evening check your blood sugar twice a day.  vary the time of day when you check, between before the 3 meals, and at bedtime.  also check if you have symptoms of your blood sugar being too high or too low.  please keep a record of the readings and bring it to your next appointment here.  please call us sooner if your blood sugar goes below 70, or if you have a lot of readings over 200.   Please come back for a follow-up appointment in 3 months.

## 2013-12-23 NOTE — Progress Notes (Signed)
Subjective:    Patient ID: Justin Hill, male    DOB: 02-Jan-1941, 73 y.o.   MRN: [id number]  HPI pt returns for f/u of type 2 DM (dx'ed 2003, on a routine blood test; He has mild if any neuropathy of the lower extremities, but he has associated by CHF, toe ulcer, partial toe amputation, and renal insufficiency; he takes human insulin for cost reasons; in October of 2014, he was changed from multiple daily injections to NPH only, due to persistently high a1c; he has never had severe hypoglycemia or DKA).  no cbg record, but states cbg's vary from 100-200.  It is in general higher as the day goes on.    Past Medical History  Diagnosis Date  . Cardiomyopathy   . Personal history of kidney stones   . Cardiomyopathy   . CHF (congestive heart failure)   . Diabetes mellitus   . Hypercholesterolemia   . Hypertension   . Diverticulosis     Past Surgical History  Procedure Laterality Date  . Shoulder surgery  1981    left. Muscle reattachment  . Tonsillectomy    . Toe surgery Right 12/2012    2nd toe    History   Social History  . Marital Status: Married    Spouse Name: Rosemarie Beath    Number of Children: 1  . Years of Education: 12   Occupational History  . retired    Social History Main Topics  . Smoking status: Former Smoker    Types: Cigarettes  . Smokeless tobacco: Never Used  . Alcohol Use: No  . Drug Use: No  . Sexual Activity: Not on file   Other Topics Concern  . Not on file   Social History Narrative   Regular exercise-no          Current Outpatient Prescriptions on File Prior to Visit  Medication Sig Dispense Refill  . Ascorbic Acid (VITAMIN C) 500 MG CAPS Take 1 capsule by mouth 2 (two) times daily.       . Bilberry, Vaccinium myrtillus, (BILBERRY EXTRACT PO) Take by mouth. 1.066m once a day       . carvedilol (COREG) 25 MG tablet TAKE ONE TABLET BY MOUTH TWICE DAILY WITH  A  MEAL  180 tablet  0  . Insulin Syringe-Needle U-100 31G X 5/16" 0.5 ML  MISC 1 Syringe by Does not apply route 4 (four) times daily.  120 each  11  . MOVIPREP 100 G SOLR Take 1 kit (200 g total) by mouth once.  1 kit  0  . Multiple Vitamin (MULTIVITAMIN) tablet Take 1 tablet by mouth daily.        . silver sulfADIAZINE (SILVADENE) 1 % cream Use once a day      . simvastatin (ZOCOR) 40 MG tablet Take 40 mg by mouth at bedtime.        . TRILIPIX 135 MG capsule Take 1 tablet by mouth daily.      . vitamin E 400 UNIT capsule Take 400 Units by mouth daily.        .Marland KitchenZETIA 10 MG tablet Take i tablet daiy       No current facility-administered medications on file prior to visit.    Allergies  Allergen Reactions  . Aspirin   . Penicillins     Family History  Problem Relation Age of Onset  . Heart failure Maternal Grandmother   . Diabetes type II Sister 639 . Hyperlipidemia Other  Parent  . Stroke Other     Parent  . Hypertension Other     Parent  . Diabetes Other     Parent  . Colon cancer Neg Hx     BP 130/80  Pulse 87  Temp(Src) 97.7 F (36.5 C) (Oral)  Ht 5' 11" (1.803 m)  Wt 257 lb (116.574 kg)  BMI 35.86 kg/m2  SpO2 95%  Review of Systems denies hypoglycemia and weight change    Objective:   Physical Exam VITAL SIGNS:  See vs page GENERAL: no distress  outside test results are reviewed: A1c=7.3    Assessment & Plan:

## 2013-12-27 NOTE — H&P (Signed)
  Justin Hill  Jan 31, 1941  [id number]  History of Present Illness:  This is a 73 year-old white male who is here to discuss screening colonoscopy. His last exam was in 2003 and it was normal . 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 Lia Hill. His last appointment was in January 2014. A echocardiogram showed an ejection fraction of 25-30% whichwas stable and he was a class I. He denies chest pain or shortness of breath. He has an upcoming appointment with Dr. Aundra Hill for January 29th. He was scheduled for a direct colonoscopy for January 13 but this was canceled pending office visit to determine the risks and to reschedule him for the hospital procedure..  Past Medical History   Diagnosis  Date   .  Cardiomyopathy    .  Personal history of kidney stones    .  Cardiomyopathy    .  CHF (congestive heart failure)    .  Diabetes mellitus    .  Hypercholesterolemia    .  Hypertension    .  Diverticulosis     Past Surgical History   Procedure  Laterality  Date   .  Shoulder surgery   1981     left. Muscle reattachment   .  Tonsillectomy     .  Toe surgery  Right  12/2012     2nd toe    Allergies   Allergen  Reactions   .  Aspirin    .  Penicillins    Family history and social history have been reviewed.  Review of Systems:  The remainder of the 10 point ROS is negative except as outlined in the H&P  Physical Exam:  General Appearance Well developed, in no distress  Eyes Non icteric  HEENT Non traumatic, normocephalic  Mouth No lesion, tongue papillated, no cheilosis  Neck Supple without adenopathy, thyroid not enlarged, no carotid bruits, thick neck. JV's not visible  Lungs Clear to auscultation bilaterally  COR Normal S1, normal S2, regular rhythm, no murmur, quiet precordium  Abdomen protuberant soft and nontender with normoactive bowel sounds  Rectal not done  Extremities trace pedal edema  Skin No lesions  Neurological Alert and oriented  x 3  Psychological Normal mood and affect  Assessment and Plan:  Problem #1 Patient is due for a screening colonoscopy. He has cardiomyopathy and an ejection fraction of 25-30%. He has an upcoming appointment with his new cardiologist Dr Justin Hill on 12/22/2013. We will tentatively schedule his colonoscopy after his visit with Dr Justin Hill, at around early February , 2015. as a hospital procedure with conscious sedation. We will review Dr. Claris Hill note prior to that appointment.  Justin Hill  11/28/2013

## 2013-12-29 ENCOUNTER — Encounter (HOSPITAL_COMMUNITY)
Admission: RE | Disposition: A | Discharge status: Home / Self Care | Payer: Medicare Other | Source: Ambulatory Visit | Attending: Internal Medicine

## 2013-12-29 ENCOUNTER — Encounter (HOSPITAL_COMMUNITY): Payer: Self-pay

## 2013-12-29 ENCOUNTER — Ambulatory Visit (HOSPITAL_COMMUNITY)
Admission: RE | Admit: 2013-12-29 | Discharge: 2013-12-29 | Disposition: A | Discharge status: Home / Self Care | Payer: Medicare Other | Source: Ambulatory Visit | Attending: Internal Medicine | Admitting: Internal Medicine

## 2013-12-29 DIAGNOSIS — D126 Benign neoplasm of colon, unspecified: Secondary | ICD-10-CM | POA: Insufficient documentation

## 2013-12-29 DIAGNOSIS — I428 Other cardiomyopathies: Secondary | ICD-10-CM | POA: Insufficient documentation

## 2013-12-29 DIAGNOSIS — Z1211 Encounter for screening for malignant neoplasm of colon: Secondary | ICD-10-CM | POA: Insufficient documentation

## 2013-12-29 DIAGNOSIS — E119 Type 2 diabetes mellitus without complications: Secondary | ICD-10-CM | POA: Diagnosis not present

## 2013-12-29 DIAGNOSIS — K5730 Diverticulosis of large intestine without perforation or abscess without bleeding: Secondary | ICD-10-CM | POA: Insufficient documentation

## 2013-12-29 DIAGNOSIS — E7800 Pure hypercholesterolemia, unspecified: Secondary | ICD-10-CM | POA: Insufficient documentation

## 2013-12-29 HISTORY — PX: COLONOSCOPY: SHX5424

## 2013-12-29 LAB — GLUCOSE, CAPILLARY: Glucose-Capillary: 158 mg/dL — ABNORMAL HIGH (ref 70–99)

## 2013-12-29 SURGERY — COLONOSCOPY
Anesthesia: Moderate Sedation

## 2013-12-29 MED ORDER — FENTANYL CITRATE 0.05 MG/ML IJ SOLN
INTRAMUSCULAR | Status: AC
  Filled 2013-12-29: qty 2

## 2013-12-29 MED ORDER — FENTANYL CITRATE 0.05 MG/ML IJ SOLN
INTRAMUSCULAR | Status: DC | PRN
  Administered 2013-12-29 (×2): 25 ug via INTRAVENOUS

## 2013-12-29 MED ORDER — MIDAZOLAM HCL 5 MG/5ML IJ SOLN
INTRAMUSCULAR | Status: DC | PRN
  Administered 2013-12-29 (×2): 1 mg via INTRAVENOUS
  Administered 2013-12-29: 2 mg via INTRAVENOUS

## 2013-12-29 MED ORDER — SODIUM CHLORIDE 0.9 % IV SOLN
INTRAVENOUS | Status: DC
  Administered 2013-12-29: 500 mL via INTRAVENOUS

## 2013-12-29 MED ORDER — MIDAZOLAM HCL 10 MG/2ML IJ SOLN
INTRAMUSCULAR | Status: AC
  Filled 2013-12-29: qty 2

## 2013-12-29 NOTE — Interval H&P Note (Signed)
History and Physical Interval Note:  12/29/2013 10:35 AM  Justin Hill  has presented today for surgery, with the diagnosis of screening for colon cancer  The various methods of treatment have been discussed with the patient and family. After consideration of risks, benefits and other options for treatment, the patient has consented to  Procedure(s): COLONOSCOPY (N/A) as a surgical intervention .  The patient's history has been reviewed, patient examined, no change in status, stable for surgery.  I have reviewed the patient's chart and labs.  Questions were answered to the patient's satisfaction.     [name]

## 2013-12-29 NOTE — Op Note (Signed)
[facility] [place], [id number]   COLONOSCOPY PROCEDURE REPORT  PATIENT: Justin Hill, Justin Hill  MR#: [id number] BIRTHDATE: 1941-09-14 , 72  yrs. old GENDER: Male ENDOSCOPIST: Lafayette Dragon, MD REFERRED [id number] Claris Gladden, M.D. , Dr Loanne Drilling, Dr Humphrey Rolls PROCEDURE DATE:  12/29/2013 PROCEDURE:   Colonoscopy with cold biopsy polypectomy First Screening Colonoscopy - Avg.  risk and is 50 yrs.  old or older - No.  Prior Negative Screening - Now for repeat screening. 10 or more years since last screening  History of Adenoma - Now for follow-up colonoscopy & has been > or = to 3 yrs.  N/A  Polyps Removed Today? Yes. ASA CLASS:   Class III INDICATIONS:Average risk patient for colon cancer. MEDICATIONS: These medications were titrated to patient response per physician's verbal order, Fentanyl 50 mcg IV, and Versed 4 mg IV  DESCRIPTION OF PROCEDURE:   After the risks benefits and alternatives of the procedure were thoroughly explained, informed consent was obtained.  A digital rectal exam revealed no abnormalities of the rectum.   The     endoscope was introduced through the anus and advanced to the cecum, which was identified by both the appendix and ileocecal valve. No adverse events experienced.   The quality of the prep was excellent, using MoviPrep  The instrument was then slowly withdrawn as the colon was fully examined.      COLON FINDINGS: A polypoid shaped sessile polyp ranging between 3-37mm in size was found in the descending colon.  A polypectomy was performed with cold forceps.  The resection was complete and the polyp tissue was completely retrieved.   Mild diverticulosis was noted throughout the entire examined colon.  Retroflexed views revealed no abnormalities. The time to cecum=4 minutes 25 seconds. Withdrawal time=7 minutes 40 seconds.  The scope was withdrawn and the procedure completed. COMPLICATIONS: There were no  complications.  ENDOSCOPIC IMPRESSION: 1.   Sessile polyp ranging between 3-57mm in size was found in the descending colon; polypectomy was performed with cold forceps 2.   Mild diverticulosis was noted throughout the entire examined colon  RECOMMENDATIONS: 1.  Await pathology results 2.  high fiber diet reca;; colon pending Path report   eSigned:  Lafayette Dragon, MD 12/29/2013 11:22 AM   cc:   PATIENT NAME:  Justin Hill, Justin Hill MR#: [id number]

## 2013-12-29 NOTE — Discharge Instructions (Signed)
Colonoscopy, Care After °Refer to this sheet in the next few weeks. These instructions provide you with information on caring for yourself after your procedure. Your health care provider may also give you more specific instructions. Your treatment has been planned according to current medical practices, but problems sometimes occur. Call your health care provider if you have any problems or questions after your procedure. °WHAT TO EXPECT AFTER THE PROCEDURE  °After your procedure, it is typical to have the following: °· A small amount of blood in your stool. °· Moderate amounts of gas and mild abdominal cramping or bloating. °HOME CARE INSTRUCTIONS °· Do not drive, operate machinery, or sign important documents for 24 hours. °· You may shower and resume your regular physical activities, but move at a slower pace for the first 24 hours. °· Take frequent rest periods for the first 24 hours. °· Walk around or put a warm pack on your abdomen to help reduce abdominal cramping and bloating. °· Drink enough fluids to keep your urine clear or pale yellow. °· You may resume your normal diet as instructed by your health care provider. Avoid heavy or fried foods that are hard to digest. °· Avoid drinking alcohol for 24 hours or as instructed by your health care provider. °· Only take over-the-counter or prescription medicines as directed by your health care provider. °· If a tissue sample (biopsy) was taken during your procedure: °· Do not take aspirin or blood thinners for 7 days, or as instructed by your health care provider. °· Do not drink alcohol for 7 days, or as instructed by your health care provider. °· Eat soft foods for the first 24 hours. °SEEK MEDICAL CARE IF: °You have persistent spotting of blood in your stool 2 3 days after the procedure. °SEEK IMMEDIATE MEDICAL CARE IF: °· You have more than a small spotting of blood in your stool. °· You pass large blood clots in your stool. °· Your abdomen is swollen  (distended). °· You have nausea or vomiting. °· You have a fever. °· You have increasing abdominal pain that is not relieved with medicine. °Document Released: 06/24/2004 Document Revised: 08/31/2013 Document Reviewed: 07/18/2013 °ExitCare® Patient Information ©2014 [employer]. ° °

## 2013-12-30 ENCOUNTER — Encounter: Payer: Self-pay | Admitting: Internal Medicine

## 2013-12-30 ENCOUNTER — Encounter (HOSPITAL_COMMUNITY): Payer: Self-pay | Admitting: Internal Medicine

## 2014-01-17 ENCOUNTER — Encounter: Payer: Self-pay | Admitting: Endocrinology

## 2014-01-26 DIAGNOSIS — L538 Other specified erythematous conditions: Secondary | ICD-10-CM | POA: Diagnosis not present

## 2014-01-26 DIAGNOSIS — B372 Candidiasis of skin and nail: Secondary | ICD-10-CM | POA: Diagnosis not present

## 2014-01-26 DIAGNOSIS — L259 Unspecified contact dermatitis, unspecified cause: Secondary | ICD-10-CM | POA: Diagnosis not present

## 2014-02-09 ENCOUNTER — Ambulatory Visit: Payer: Medicare Other

## 2014-02-19 ENCOUNTER — Other Ambulatory Visit: Payer: Self-pay | Admitting: Cardiology

## 2014-02-22 DIAGNOSIS — E119 Type 2 diabetes mellitus without complications: Secondary | ICD-10-CM | POA: Diagnosis not present

## 2014-02-22 DIAGNOSIS — E7800 Pure hypercholesterolemia, unspecified: Secondary | ICD-10-CM | POA: Diagnosis not present

## 2014-02-23 ENCOUNTER — Ambulatory Visit (INDEPENDENT_AMBULATORY_CARE_PROVIDER_SITE_OTHER): Payer: Medicare Other

## 2014-02-23 VITALS — BP 147/73 | HR 72 | Resp 18

## 2014-02-23 DIAGNOSIS — E1149 Type 2 diabetes mellitus with other diabetic neurological complication: Secondary | ICD-10-CM

## 2014-02-23 DIAGNOSIS — E1142 Type 2 diabetes mellitus with diabetic polyneuropathy: Secondary | ICD-10-CM

## 2014-02-23 DIAGNOSIS — L608 Other nail disorders: Secondary | ICD-10-CM | POA: Diagnosis not present

## 2014-02-23 DIAGNOSIS — E1140 Type 2 diabetes mellitus with diabetic neuropathy, unspecified: Secondary | ICD-10-CM

## 2014-02-23 NOTE — Patient Instructions (Signed)
Diabetes and Foot Care Diabetes may cause you to have problems because of poor blood supply (circulation) to your feet and legs. This may cause the skin on your feet to become thinner, break easier, and heal more slowly. Your skin may become dry, and the skin may peel and crack. You may also have nerve damage in your legs and feet causing decreased feeling in them. You may not notice minor injuries to your feet that could lead to infections or more serious problems. Taking care of your feet is one of the most important things you can do for yourself.  HOME CARE INSTRUCTIONS  Wear shoes at all times, even in the house. Do not go barefoot. Bare feet are easily injured.  Check your feet daily for blisters, cuts, and redness. If you cannot see the bottom of your feet, use a mirror or ask someone for help.  Wash your feet with warm water (do not use hot water) and mild soap. Then pat your feet and the areas between your toes until they are completely dry. Do not soak your feet as this can dry your skin.  Apply a moisturizing lotion or petroleum jelly (that does not contain alcohol and is unscented) to the skin on your feet and to dry, brittle toenails. Do not apply lotion between your toes.  Trim your toenails straight across. Do not dig under them or around the cuticle. File the edges of your nails with an emery board or nail file.  Do not cut corns or calluses or try to remove them with medicine.  Wear clean socks or stockings every day. Make sure they are not too tight. Do not wear knee-high stockings since they may decrease blood flow to your legs.  Wear shoes that fit properly and have enough cushioning. To break in new shoes, wear them for just a few hours a day. This prevents you from injuring your feet. Always look in your shoes before you put them on to be sure there are no objects inside.  Do not cross your legs. This may decrease the blood flow to your feet.  If you find a minor scrape,  cut, or break in the skin on your feet, keep it and the skin around it clean and dry. These areas may be cleansed with mild soap and water. Do not cleanse the area with peroxide, alcohol, or iodine.  When you remove an adhesive bandage, be sure not to damage the skin around it.  If you have a wound, look at it several times a day to make sure it is healing.  Do not use heating pads or hot water bottles. They may burn your skin. If you have lost feeling in your feet or legs, you may not know it is happening until it is too late.  Make sure your health care provider performs a complete foot exam at least annually or more often if you have foot problems. Report any cuts, sores, or bruises to your health care provider immediately. SEEK MEDICAL CARE IF:   You have an injury that is not healing.  You have cuts or breaks in the skin.  You have an ingrown nail.  You notice redness on your legs or feet.  You feel burning or tingling in your legs or feet.  You have pain or cramps in your legs and feet.  Your legs or feet are numb.  Your feet always feel cold. SEEK IMMEDIATE MEDICAL CARE IF:   There is increasing redness,   swelling, or pain in or around a wound.  There is a red line that goes up your leg.  Pus is coming from a wound.  You develop a fever or as directed by your health care provider.  You notice a bad smell coming from an ulcer or wound. Document Released: 11/07/2000 Document Revised: 07/13/2013 Document Reviewed: 04/19/2013 Exit[employer] Patient Information 2014 [employer].  

## 2014-02-23 NOTE — Progress Notes (Signed)
   Subjective:    Patient ID: Justin Hill, male    DOB: [birth year], 73 y.o.   MRN: [id number]  HPI i am here to get my toenails trimmed and pick up my diabetic shoes    Review of Systems no new changes or findings     Objective:   Physical Exam Masker status is intact with pedal pulses palpable DP postal for PT one over 4 patient has had amputation distal second digit right foot remaining toes doing well nails thick brittle crumbly discolored friable and criptotic capillary refill timed remaining toes 3-4 seconds neurologically epicritic and proprioceptive sensations significantly diminished on Semmes Weinstein testing. There is normal plantar response DTRs not elicited dermatologically no open wounds or some diffuse keratoses sub-fifth left however nails thick brittle crumbly friable no open wounds ulcerations no secondary infections. Does have digital contractures. Patient is dispensed 1  Thick shoes and 3 pairs of dual density Plastizote inlays the shoes in lace fit and contour well to the foot with instructions for use in the shoes and break in period       Assessment & Plan:  Assessment diabetes with peripheral neuropathy and complications this time debridement of dystrophic probably mycotic nails x9 also dispensed a new shoes a new pair repair of the density Plastizote inlays reappointed 3 months for continued palliative care as needed    Harriet Masson DPM

## 2014-03-07 DIAGNOSIS — E119 Type 2 diabetes mellitus without complications: Secondary | ICD-10-CM | POA: Diagnosis not present

## 2014-03-07 DIAGNOSIS — D696 Thrombocytopenia, unspecified: Secondary | ICD-10-CM | POA: Diagnosis not present

## 2014-03-07 DIAGNOSIS — E785 Hyperlipidemia, unspecified: Secondary | ICD-10-CM | POA: Diagnosis not present

## 2014-03-07 DIAGNOSIS — M79609 Pain in unspecified limb: Secondary | ICD-10-CM | POA: Diagnosis not present

## 2014-03-07 DIAGNOSIS — I10 Essential (primary) hypertension: Secondary | ICD-10-CM | POA: Diagnosis not present

## 2014-03-24 ENCOUNTER — Ambulatory Visit (INDEPENDENT_AMBULATORY_CARE_PROVIDER_SITE_OTHER): Payer: Medicare Other | Admitting: Endocrinology

## 2014-03-24 ENCOUNTER — Encounter: Payer: Self-pay | Admitting: Endocrinology

## 2014-03-24 VITALS — BP 126/80 | HR 79 | Temp 97.9°F | Ht 71.0 in | Wt 253.0 lb

## 2014-03-24 DIAGNOSIS — E119 Type 2 diabetes mellitus without complications: Secondary | ICD-10-CM | POA: Diagnosis not present

## 2014-03-24 DIAGNOSIS — E1029 Type 1 diabetes mellitus with other diabetic kidney complication: Secondary | ICD-10-CM | POA: Diagnosis not present

## 2014-03-24 NOTE — Patient Instructions (Addendum)
Please increase the insulin to 65 units each morning, and 25 units in the evening check your blood sugar twice a day.  vary the time of day when you check, between before the 3 meals, and at bedtime.  also check if you have symptoms of your blood sugar being too high or too low.  please keep a record of the readings and bring it to your next appointment here.  please call us sooner if your blood sugar goes below 70, or if you have a lot of readings over 200.     Please come back for a follow-up appointment in 3 months.

## 2014-03-24 NOTE — Progress Notes (Signed)
Subjective:    Patient ID: Justin Hill, male    DOB: Jul 16, 1941, 73 y.o.   MRN: [id number]  HPI pt returns for f/u of type 2 DM (dx'ed 2003, on a routine blood test; He has mild if any neuropathy of the lower extremities, but he has associated by CHF, toe ulcer, partial toe amputation, and renal insufficiency; he takes human insulin for cost reasons; in October of 2014, he was changed from multiple daily injections to NPH only, due to persistently high a1c; he has never had severe hypoglycemia or DKA).  he brings a record of his cbg's which i have reviewed today.  It varies from 98-180.  There is no trend throughout the day.  Past Medical History  Diagnosis Date  . Cardiomyopathy   . Personal history of kidney stones   . Cardiomyopathy   . CHF (congestive heart failure)   . Diabetes mellitus   . Hypercholesterolemia   . Hypertension   . Diverticulosis     Past Surgical History  Procedure Laterality Date  . Shoulder surgery  1981    left. Muscle reattachment  . Tonsillectomy    . Toe surgery Right 12/2012    2nd toe  . Colonoscopy N/A 12/29/2013    Procedure: COLONOSCOPY;  Surgeon: Lafayette Dragon, MD;  Location: [facility];  Service: Endoscopy;  Laterality: N/A;    History   Social History  . Marital Status: Married    Spouse Name: Rosemarie Beath    Number of Children: 1  . Years of Education: 12   Occupational History  . retired    Social History Main Topics  . Smoking status: Former Smoker    Types: Cigarettes  . Smokeless tobacco: Never Used  . Alcohol Use: No  . Drug Use: No  . Sexual Activity: Not on file   Other Topics Concern  . Not on file   Social History Narrative   Regular exercise-no          Current Outpatient Prescriptions on File Prior to Visit  Medication Sig Dispense Refill  . Ascorbic Acid (VITAMIN C) 500 MG CAPS Take 1 capsule by mouth 2 (two) times daily.       . betamethasone valerate lotion (VALISONE) 0.1 %       . Bilberry,  Vaccinium myrtillus, (BILBERRY EXTRACT PO) Take by mouth. 1.062m once a day       . carvedilol (COREG) 25 MG tablet Take 1 tablet (25 mg total) by mouth 2 (two) times daily with a meal.  180 tablet  1  . fluconazole (DIFLUCAN) 200 MG tablet       . Insulin Syringe-Needle U-100 31G X 5/16" 0.5 ML MISC 1 Syringe by Does not apply route 4 (four) times daily.  120 each  11  . MOVIPREP 100 G SOLR Take 1 kit (200 g total) by mouth once.  1 kit  0  . Multiple Vitamin (MULTIVITAMIN) tablet Take 1 tablet by mouth daily.        . silver sulfADIAZINE (SILVADENE) 1 % cream Use once a day      . simvastatin (ZOCOR) 40 MG tablet Take 40 mg by mouth at bedtime.        . TRILIPIX 135 MG capsule Take 1 tablet by mouth daily.      . vitamin E 400 UNIT capsule Take 400 Units by mouth daily.        .Marland KitchenZETIA 10 MG tablet Take i tablet daiy  No current facility-administered medications on file prior to visit.    Allergies  Allergen Reactions  . Aspirin   . Penicillins     Family History  Problem Relation Age of Onset  . Heart failure Maternal Grandmother   . Diabetes type II Sister 64  . Hyperlipidemia Other     Parent  . Stroke Other     Parent  . Hypertension Other     Parent  . Diabetes Other     Parent  . Colon cancer Neg Hx     BP 126/80  Pulse 79  Temp(Src) 97.9 F (36.6 C) (Oral)  Ht '5\' 11"'  (1.803 m)  Wt 253 lb (114.76 kg)  BMI 35.30 kg/m2  SpO2 97%  Review of Systems He denies hypoglycemia and weight change    Objective:   Physical Exam VITAL SIGNS:  See vs page GENERAL: no distress   outside test results are reviewed: A1c=8.2 Creat=1.4    Assessment & Plan:  DM: he needs increased rx   Renal insufficiency: this increases the duration of action of insulin.

## 2014-05-03 ENCOUNTER — Encounter: Payer: Self-pay | Admitting: Cardiology

## 2014-05-03 ENCOUNTER — Ambulatory Visit (INDEPENDENT_AMBULATORY_CARE_PROVIDER_SITE_OTHER): Payer: Medicare Other | Admitting: Cardiology

## 2014-05-03 ENCOUNTER — Encounter: Payer: Self-pay | Admitting: *Deleted

## 2014-05-03 VITALS — BP 138/64 | HR 72 | Ht 71.0 in | Wt 254.0 lb

## 2014-05-03 DIAGNOSIS — N183 Chronic kidney disease, stage 3 unspecified: Secondary | ICD-10-CM

## 2014-05-03 DIAGNOSIS — E7800 Pure hypercholesterolemia, unspecified: Secondary | ICD-10-CM | POA: Diagnosis not present

## 2014-05-03 DIAGNOSIS — I428 Other cardiomyopathies: Secondary | ICD-10-CM

## 2014-05-03 DIAGNOSIS — N289 Disorder of kidney and ureter, unspecified: Secondary | ICD-10-CM | POA: Diagnosis not present

## 2014-05-03 NOTE — Patient Instructions (Addendum)
Your physician has requested that you have an echocardiogram. Echocardiography is a painless test that uses sound waves to create images of your heart. It provides your doctor with information about the size and shape of your heart and how well your heart's chambers and valves are working. This procedure takes approximately one hour. There are no restrictions for this procedure.  Your physician recommends that you have lab today--BMET.  Your physician wants you to follow-up in: 6 months with Dr Aundra Dubin. (December 2015). You will receive a reminder letter in the mail two months in advance. If you don't receive a letter, please call our office to schedule the follow-up appointment.   Call me if you decide you want to schedule the CPX stress test. Eliot Ford [phone]

## 2014-05-04 LAB — BASIC METABOLIC PANEL
BUN: 19 mg/dL (ref 6–23)
CO2: 32 mEq/L (ref 19–32)
CREATININE: 1.6 mg/dL — AB (ref 0.4–1.5)
Calcium: 9.9 mg/dL (ref 8.4–10.5)
Chloride: 100 mEq/L (ref 96–112)
GFR: 45.49 mL/min — AB (ref >60.00)
Glucose, Bld: 215 mg/dL — ABNORMAL HIGH (ref 70–99)
Potassium: 4.9 mEq/L (ref 3.5–5.1)
Sodium: 138 mEq/L (ref 135–145)

## 2014-05-04 NOTE — Progress Notes (Signed)
Patient ID: [name], male   DOB: June 02, 1941, 73 y.o.   MRN: [id number] PCP: Dr. Humphrey Rolls [facility])  73 yo with history CKD, HTN, diabetes, chronic LBBB, and nonischemic cardiomyopathy presents for cardiology followup.  His cardiomyopathy has been known for years.  Most recent echo in 1/14 showed EF 25-30%, which is stable.  He had a LHC in 2008 showing mild nonobstructive CAD.  An ICD was considered but decided against in the past given NYHA class I symptoms.  He is only on Coreg as he had hyperkalemia/AKI with ACEI use.    Currently, patient is symptomatically stable. He denies exertional dyspnea.  He can walk up stairs and do yardwork with no problems.  No orthopnea/PND.  No palpitations.  No chest pain.  He does some walking with his wife.  Weight is down 3 lbs.   Labs (5/14): K 4, creatinine 1.4, LFTs normal, LDL 46, HDL 37 Labs (1/15): K 4.4, creatinine 1.46, LDL 74, HDL 35 Labs (4/15); LDL 96, HDL 44, K 5.2, creatinine 1.4  ECG: NSR, LBBB  PMH: 1. Type II diabetes 2. CKD 3. Hyperlipidemia 4. HTN 5. Nonischemic cardiomyopathy: This was diagnosed prior to 2008.  In 2008, patient had LHC with only mild nonobstructive coronary disease.  Most recent echo in 1/14 showed EF 25-30% with normal RV.  Patient has not tolerated ACEI due to hyperkalemia and AKI. ICD was discussed (Dr Lovena Le) and decided against given NYHA class I symptoms.  6. Chronic LBBB 7. Obesity  SH: Lives in [place], married, prior smoker, no ETOH.  1 child.   FH: Grandmother with CHF.  Mother with CVA.   ROS: All systems reviewed and negative except as per HPI.    Current Outpatient Prescriptions  Medication Sig Dispense Refill  . Ascorbic Acid (VITAMIN C) 500 MG CAPS Take 1 capsule by mouth 2 (two) times daily.       . betamethasone valerate lotion (VALISONE) 0.1 %       . Bilberry, Vaccinium myrtillus, (BILBERRY EXTRACT PO) Take by mouth. 1.000mg  once a day       . carvedilol (COREG) 25 MG tablet Take 1  tablet (25 mg total) by mouth 2 (two) times daily with a meal.  180 tablet  1  . insulin NPH Human (HUMULIN N,NOVOLIN N) 100 UNIT/ML injection 65 units into the skin each morning and 25 units each evening.      . Insulin Syringe-Needle U-100 31G X 5/16" 0.5 ML MISC 1 Syringe by Does not apply route 4 (four) times daily.  120 each  11  . Multiple Vitamin (MULTIVITAMIN) tablet Take 1 tablet by mouth daily.        . simvastatin (ZOCOR) 40 MG tablet Take 40 mg by mouth at bedtime.        . TRILIPIX 135 MG capsule Take 1 tablet by mouth daily.      Marland Kitchen ZETIA 10 MG tablet Take i tablet daiy       No current facility-administered medications for this visit.    BP 138/64  Pulse 72  Ht 5\' 11"  (1.803 m)  Wt 115.214 kg (254 lb)  BMI 35.44 kg/m2 General: NAD, obese Neck: Thick, no JVD, no thyromegaly or thyroid nodule.  Lungs: Clear to auscultation bilaterally with normal respiratory effort. CV: Nondisplaced PMI.  Heart regular S1/S2, no S3/S4, no murmur.  1+ ankle edema.  No carotid bruit.  Normal pedal pulses.  Abdomen: Soft, nontender, no hepatosplenomegaly, no distention.  Skin: Intact without  lesions or rashes.  Neurologic: Alert and oriented x 3.  Psych: Normal affect. Extremities: No clubbing or cyanosis.   Assessment/Plan: 1. Cardiomyopathy: Nonischemic.  Low EF x years. Most recent echo in 1/14 showed EF 25-30%, which is stable.  By description, symptoms are NYHA class I.  Therefore, he does not qualify for ICD. However, I do not think that he is particularly active. 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  Interestingly, it also appears that he has a long-standing LBBB.  A chronic LBBB itself can potentially cause a cardiomyopathy and may be the source of his low EF.   - Continue Coreg, he is at goal dose. - I again suggested that he do a cardiopulmonary exercise test to show [place] if he really is NYHA class I or if his functional  capacity is really worse than he realizes.  If were to do poorly on the CPX, I think that it would be reasonable to pursue CRT-D.  He has a wide LBBB, and his EF would be reasonably likely to improve with CRT.  He is still not sure he wants to do this, so I asked him to think about it and get back to me.  - I will get a repeat echo. - Most recent labs showed elevated K so will continue to hold off on ACEI and spironolactone.   2. Hyperlipidemia: Patient is on simvastatin with good lipids in 4/15.  3. CKD: This has remained stable, most recent creatinine 1.4.   Loralie Champagne 05/04/2014

## 2014-05-16 ENCOUNTER — Other Ambulatory Visit: Payer: Self-pay

## 2014-05-16 ENCOUNTER — Other Ambulatory Visit: Payer: Self-pay | Admitting: Endocrinology

## 2014-05-25 ENCOUNTER — Other Ambulatory Visit (HOSPITAL_COMMUNITY): Payer: Medicare Other

## 2014-06-01 ENCOUNTER — Ambulatory Visit: Payer: Medicare Other

## 2014-06-05 ENCOUNTER — Ambulatory Visit (INDEPENDENT_AMBULATORY_CARE_PROVIDER_SITE_OTHER): Payer: Medicare Other

## 2014-06-05 VITALS — BP 134/83 | HR 86 | Resp 18

## 2014-06-05 DIAGNOSIS — L608 Other nail disorders: Secondary | ICD-10-CM | POA: Diagnosis not present

## 2014-06-05 DIAGNOSIS — E1142 Type 2 diabetes mellitus with diabetic polyneuropathy: Secondary | ICD-10-CM

## 2014-06-05 DIAGNOSIS — E1149 Type 2 diabetes mellitus with other diabetic neurological complication: Secondary | ICD-10-CM | POA: Diagnosis not present

## 2014-06-05 DIAGNOSIS — E1140 Type 2 diabetes mellitus with diabetic neuropathy, unspecified: Secondary | ICD-10-CM

## 2014-06-05 NOTE — Progress Notes (Signed)
   Subjective:    Patient ID: Justin Hill, male    DOB: November 21, 1941, 73 y.o.   MRN: [id number]  HPI I AM HERE TO GET MY TOENAILS TRIMMED UP    Review of Systems no new findings or systemic changes noted     Objective:   Physical Exam Lower extremity objective findings as follows pedal pulses palpable DP +2/4 bilateral PT plus one over 4 bilateral capillary refill time 3 seconds all digits epicritic and proprioceptive sensations intact and symmetric bilateral decreased sensation to the toes and forefoot noted. Mild semirigid digital contractures noted 2 through 5 open wounds ulcerations the second no secondary infection      Assessment & Plan:  Assessment diabetes with peripheral neuropathy dystrophic trouble mycotic criptotic nails 1 through 5 bilateral debrided this time Justin Hill for future diabetic foot and palliative nail care as needed 3 month followup recommended  Justin Hill DPM

## 2014-06-05 NOTE — Patient Instructions (Signed)
Diabetes and Foot Care Diabetes may cause you to have problems because of poor blood supply (circulation) to your feet and legs. This may cause the skin on your feet to become thinner, break easier, and heal more slowly. Your skin may become dry, and the skin may peel and crack. You may also have nerve damage in your legs and feet causing decreased feeling in them. You may not notice minor injuries to your feet that could lead to infections or more serious problems. Taking care of your feet is one of the most important things you can do for yourself.  HOME CARE INSTRUCTIONS  Wear shoes at all times, even in the house. Do not go barefoot. Bare feet are easily injured.  Check your feet daily for blisters, cuts, and redness. If you cannot see the bottom of your feet, use a mirror or ask someone for help.  Wash your feet with warm water (do not use hot water) and mild soap. Then pat your feet and the areas between your toes until they are completely dry. Do not soak your feet as this can dry your skin.  Apply a moisturizing lotion or petroleum jelly (that does not contain alcohol and is unscented) to the skin on your feet and to dry, brittle toenails. Do not apply lotion between your toes.  Trim your toenails straight across. Do not dig under them or around the cuticle. File the edges of your nails with an emery board or nail file.  Do not cut corns or calluses or try to remove them with medicine.  Wear clean socks or stockings every day. Make sure they are not too tight. Do not wear knee-high stockings since they may decrease blood flow to your legs.  Wear shoes that fit properly and have enough cushioning. To break in new shoes, wear them for just a few hours a day. This prevents you from injuring your feet. Always look in your shoes before you put them on to be sure there are no objects inside.  Do not cross your legs. This may decrease the blood flow to your feet.  If you find a minor scrape,  cut, or break in the skin on your feet, keep it and the skin around it clean and dry. These areas may be cleansed with mild soap and water. Do not cleanse the area with peroxide, alcohol, or iodine.  When you remove an adhesive bandage, be sure not to damage the skin around it.  If you have a wound, look at it several times a day to make sure it is healing.  Do not use heating pads or hot water bottles. They may burn your skin. If you have lost feeling in your feet or legs, you may not know it is happening until it is too late.  Make sure your health care provider performs a complete foot exam at least annually or more often if you have foot problems. Report any cuts, sores, or bruises to your health care provider immediately. SEEK MEDICAL CARE IF:   You have an injury that is not healing.  You have cuts or breaks in the skin.  You have an ingrown nail.  You notice redness on your legs or feet.  You feel burning or tingling in your legs or feet.  You have pain or cramps in your legs and feet.  Your legs or feet are numb.  Your feet always feel cold. SEEK IMMEDIATE MEDICAL CARE IF:   There is increasing redness,   swelling, or pain in or around a wound.  There is a red line that goes up your leg.  Pus is coming from a wound.  You develop a fever or as directed by your health care provider.  You notice a bad smell coming from an ulcer or wound. Document Released: 11/07/2000 Document Revised: 07/13/2013 Document Reviewed: 04/19/2013 ExitCare Patient Information 2015 [employer]. This information is not intended to replace advice given to you by your health care provider. Make sure you discuss any questions you have with your health care provider.  

## 2014-06-07 ENCOUNTER — Ambulatory Visit (HOSPITAL_COMMUNITY): Payer: Medicare Other | Attending: Cardiology | Admitting: Cardiology

## 2014-06-07 DIAGNOSIS — I10 Essential (primary) hypertension: Secondary | ICD-10-CM | POA: Diagnosis not present

## 2014-06-07 DIAGNOSIS — E119 Type 2 diabetes mellitus without complications: Secondary | ICD-10-CM | POA: Diagnosis not present

## 2014-06-07 DIAGNOSIS — I428 Other cardiomyopathies: Secondary | ICD-10-CM

## 2014-06-07 DIAGNOSIS — Z87891 Personal history of nicotine dependence: Secondary | ICD-10-CM | POA: Diagnosis not present

## 2014-06-07 DIAGNOSIS — E669 Obesity, unspecified: Secondary | ICD-10-CM | POA: Diagnosis not present

## 2014-06-07 DIAGNOSIS — I359 Nonrheumatic aortic valve disorder, unspecified: Secondary | ICD-10-CM | POA: Diagnosis not present

## 2014-06-07 DIAGNOSIS — E785 Hyperlipidemia, unspecified: Secondary | ICD-10-CM | POA: Diagnosis not present

## 2014-06-07 DIAGNOSIS — I447 Left bundle-branch block, unspecified: Secondary | ICD-10-CM | POA: Diagnosis not present

## 2014-06-07 NOTE — Progress Notes (Signed)
Echo performed. 

## 2014-06-11 ENCOUNTER — Encounter: Payer: Self-pay | Admitting: Cardiology

## 2014-06-15 ENCOUNTER — Ambulatory Visit: Payer: Medicare Other

## 2014-06-30 DIAGNOSIS — E119 Type 2 diabetes mellitus without complications: Secondary | ICD-10-CM | POA: Diagnosis not present

## 2014-07-06 DIAGNOSIS — E7800 Pure hypercholesterolemia, unspecified: Secondary | ICD-10-CM | POA: Diagnosis not present

## 2014-07-06 DIAGNOSIS — Z780 Asymptomatic menopausal state: Secondary | ICD-10-CM | POA: Diagnosis not present

## 2014-07-06 DIAGNOSIS — E1129 Type 2 diabetes mellitus with other diabetic kidney complication: Secondary | ICD-10-CM | POA: Diagnosis not present

## 2014-07-06 DIAGNOSIS — I428 Other cardiomyopathies: Secondary | ICD-10-CM | POA: Diagnosis not present

## 2014-07-06 DIAGNOSIS — Z23 Encounter for immunization: Secondary | ICD-10-CM | POA: Diagnosis not present

## 2014-07-06 DIAGNOSIS — Z0000 Encounter for general adult medical examination without abnormal findings: Secondary | ICD-10-CM | POA: Diagnosis not present

## 2014-07-06 DIAGNOSIS — M899 Disorder of bone, unspecified: Secondary | ICD-10-CM | POA: Diagnosis not present

## 2014-07-06 DIAGNOSIS — M949 Disorder of cartilage, unspecified: Secondary | ICD-10-CM | POA: Diagnosis not present

## 2014-07-06 DIAGNOSIS — E1165 Type 2 diabetes mellitus with hyperglycemia: Secondary | ICD-10-CM | POA: Diagnosis not present

## 2014-07-06 DIAGNOSIS — N183 Chronic kidney disease, stage 3 unspecified: Secondary | ICD-10-CM | POA: Diagnosis not present

## 2014-07-10 ENCOUNTER — Ambulatory Visit (INDEPENDENT_AMBULATORY_CARE_PROVIDER_SITE_OTHER): Payer: Medicare Other | Admitting: Endocrinology

## 2014-07-10 ENCOUNTER — Encounter: Payer: Self-pay | Admitting: Endocrinology

## 2014-07-10 VITALS — BP 122/64 | HR 75 | Temp 97.7°F | Ht 71.0 in | Wt 256.0 lb

## 2014-07-10 DIAGNOSIS — E1029 Type 1 diabetes mellitus with other diabetic kidney complication: Secondary | ICD-10-CM

## 2014-07-10 NOTE — Patient Instructions (Signed)
Please increase the insulin to 75 units each morning (50 if you are going to be active), and 35 units in the evening. On this type of insulin schedule, you should eat meals on a regular schedule.  If a meal is missed or significantly delayed, your blood sugar could go low.   check your blood sugar twice a day.  vary the time of day when you check, between before the 3 meals, and at bedtime.  also check if you have symptoms of your blood sugar being too high or too low.  please keep a record of the readings and bring it to your next appointment here.  please call us sooner if your blood sugar goes below 70, or if you have a lot of readings over 200.     Please come back for a follow-up appointment in 2 months.   Please call next week to tell us how your blood sugar is doing.

## 2014-07-10 NOTE — Progress Notes (Signed)
Subjective:    Patient ID: Justin Hill, male    DOB: Mar 06, 1941, 73 y.o.   MRN: [id number]  HPI pt returns for f/u of type 2 DM (dx'ed 2003, on a routine blood test; He has mild if any neuropathy of the lower extremities, but he has associated CHF, toe ulcer, partial toe amputation, and renal insufficiency; he takes human insulin for cost reasons; in October of 2014, he was changed from multiple daily injections to NPH only, due to persistently high a1c; he has never had severe hypoglycemia or DKA).   he brings a record of his cbg's which i have reviewed today.  It varies from 118-300, but most are in the high-100's.  pt states he feels well in general.  He had 1 episode of mild hypoglycemia, when he was very active at the beach.  Past Medical History  Diagnosis Date  . Cardiomyopathy   . Personal history of kidney stones   . Cardiomyopathy   . CHF (congestive heart failure)   . Diabetes mellitus   . Hypercholesterolemia   . Hypertension   . Diverticulosis     Past Surgical History  Procedure Laterality Date  . Shoulder surgery  1981    left. Muscle reattachment  . Tonsillectomy    . Toe surgery Right 12/2012    2nd toe  . Colonoscopy N/A 12/29/2013    Procedure: COLONOSCOPY;  Surgeon: Lafayette Dragon, MD;  Location: [facility];  Service: Endoscopy;  Laterality: N/A;    History   Social History  . Marital Status: Married    Spouse Name: Rosemarie Beath    Number of Children: 1  . Years of Education: 12   Occupational History  . retired    Social History Main Topics  . Smoking status: Former Smoker    Types: Cigarettes  . Smokeless tobacco: Never Used  . Alcohol Use: No  . Drug Use: No  . Sexual Activity: Not on file   Other Topics Concern  . Not on file   Social History Narrative   Regular exercise-no          Current Outpatient Prescriptions on File Prior to Visit  Medication Sig Dispense Refill  . Ascorbic Acid (VITAMIN C) 500 MG CAPS Take 1 capsule by  mouth 2 (two) times daily.       . betamethasone valerate lotion (VALISONE) 0.1 %       . Bilberry, Vaccinium myrtillus, (BILBERRY EXTRACT PO) Take by mouth. 1.000mg  once a day       . carvedilol (COREG) 25 MG tablet Take 1 tablet (25 mg total) by mouth 2 (two) times daily with a meal.  180 tablet  1  . insulin NPH Human (NOVOLIN N RELION) 100 UNIT/ML injection INJECT 75 UNITS SUBCUTANEOUSLY EACH MORNING AND 35 UNITS IN THE EVENING.      . Insulin Syringe-Needle U-100 31G X 5/16" 0.5 ML MISC 1 Syringe by Does not apply route 4 (four) times daily.  120 each  11  . Multiple Vitamin (MULTIVITAMIN) tablet Take 1 tablet by mouth daily.        . simvastatin (ZOCOR) 40 MG tablet Take 40 mg by mouth at bedtime.        . TRILIPIX 135 MG capsule Take 1 tablet by mouth daily.      Marland Kitchen ZETIA 10 MG tablet Take i tablet daiy       No current facility-administered medications on file prior to visit.    Allergies  Allergen Reactions  . Aspirin   . Penicillins     Family History  Problem Relation Age of Onset  . Heart failure Maternal Grandmother   . Diabetes type II Sister 61  . Hyperlipidemia Other     Parent  . Stroke Other     Parent  . Hypertension Other     Parent  . Diabetes Other     Parent  . Colon cancer Neg Hx     BP 122/64  Pulse 75  Temp(Src) 97.7 F (36.5 C) (Oral)  Ht 5\' 11"  (1.803 m)  Wt 256 lb (116.121 kg)  BMI 35.72 kg/m2  SpO2 94%    Review of Systems He denies LOC and weight change.      Objective:   Physical Exam EXTEMITIES: no deformity, except for hammer toes on the left foot. no ulcer on the feet. feet are of normal temp. 1+ bilat leg edema. There is bilateral onychomycosis, rust-colored discoloration, and varicosities.  PULSES: dorsalis pedis intact bilat.  NEURO: sensation is intact to touch on the feet.     outside test results are reviewed: A1c=9%    Assessment & Plan:  DM: severe exacerbation hypoglycemia: new.  This limits the ability to  achieve good glycemic control.   Patient is advised the following: Patient Instructions  Please increase the insulin to 75 units each morning (50 if you are going to be active), and 35 units in the evening. On this type of insulin schedule, you should eat meals on a regular schedule.  If a meal is missed or significantly delayed, your blood sugar could go low.   check your blood sugar twice a day.  vary the time of day when you check, between before the 3 meals, and at bedtime.  also check if you have symptoms of your blood sugar being too high or too low.  please keep a record of the readings and bring it to your next appointment here.  please call us sooner if your blood sugar goes below 70, or if you have a lot of readings over 200.     Please come back for a follow-up appointment in 2 months.   Please call next week to tell us how your blood sugar is doing.

## 2014-07-18 DIAGNOSIS — Z136 Encounter for screening for cardiovascular disorders: Secondary | ICD-10-CM | POA: Diagnosis not present

## 2014-08-15 DIAGNOSIS — D1739 Benign lipomatous neoplasm of skin and subcutaneous tissue of other sites: Secondary | ICD-10-CM | POA: Diagnosis not present

## 2014-08-15 DIAGNOSIS — C44519 Basal cell carcinoma of skin of other part of trunk: Secondary | ICD-10-CM | POA: Diagnosis not present

## 2014-08-15 DIAGNOSIS — L820 Inflamed seborrheic keratosis: Secondary | ICD-10-CM | POA: Diagnosis not present

## 2014-09-07 ENCOUNTER — Ambulatory Visit: Payer: Medicare Other

## 2014-09-08 ENCOUNTER — Ambulatory Visit: Payer: Medicare Other | Admitting: Endocrinology

## 2014-09-08 DIAGNOSIS — E119 Type 2 diabetes mellitus without complications: Secondary | ICD-10-CM | POA: Diagnosis not present

## 2014-09-08 DIAGNOSIS — I10 Essential (primary) hypertension: Secondary | ICD-10-CM | POA: Diagnosis not present

## 2014-09-13 DIAGNOSIS — G629 Polyneuropathy, unspecified: Secondary | ICD-10-CM | POA: Diagnosis not present

## 2014-09-13 DIAGNOSIS — E1122 Type 2 diabetes mellitus with diabetic chronic kidney disease: Secondary | ICD-10-CM | POA: Diagnosis not present

## 2014-09-13 DIAGNOSIS — N183 Chronic kidney disease, stage 3 (moderate): Secondary | ICD-10-CM | POA: Diagnosis not present

## 2014-09-13 DIAGNOSIS — Z23 Encounter for immunization: Secondary | ICD-10-CM | POA: Diagnosis not present

## 2014-09-13 DIAGNOSIS — E1165 Type 2 diabetes mellitus with hyperglycemia: Secondary | ICD-10-CM | POA: Diagnosis not present

## 2014-09-15 ENCOUNTER — Ambulatory Visit (INDEPENDENT_AMBULATORY_CARE_PROVIDER_SITE_OTHER): Payer: Medicare Other

## 2014-09-15 DIAGNOSIS — L603 Nail dystrophy: Secondary | ICD-10-CM | POA: Diagnosis not present

## 2014-09-15 DIAGNOSIS — E1140 Type 2 diabetes mellitus with diabetic neuropathy, unspecified: Secondary | ICD-10-CM | POA: Diagnosis not present

## 2014-09-15 NOTE — Patient Instructions (Signed)
Diabetes and Foot Care Diabetes may cause you to have problems because of poor blood supply (circulation) to your feet and legs. This may cause the skin on your feet to become thinner, break easier, and heal more slowly. Your skin may become dry, and the skin may peel and crack. You may also have nerve damage in your legs and feet causing decreased feeling in them. You may not notice minor injuries to your feet that could lead to infections or more serious problems. Taking care of your feet is one of the most important things you can do for yourself.  HOME CARE INSTRUCTIONS  Wear shoes at all times, even in the house. Do not go barefoot. Bare feet are easily injured.  Check your feet daily for blisters, cuts, and redness. If you cannot see the bottom of your feet, use a mirror or ask someone for help.  Wash your feet with warm water (do not use hot water) and mild soap. Then pat your feet and the areas between your toes until they are completely dry. Do not soak your feet as this can dry your skin.  Apply a moisturizing lotion or petroleum jelly (that does not contain alcohol and is unscented) to the skin on your feet and to dry, brittle toenails. Do not apply lotion between your toes.  Trim your toenails straight across. Do not dig under them or around the cuticle. File the edges of your nails with an emery board or nail file.  Do not cut corns or calluses or try to remove them with medicine.  Wear clean socks or stockings every day. Make sure they are not too tight. Do not wear knee-high stockings since they may decrease blood flow to your legs.  Wear shoes that fit properly and have enough cushioning. To break in new shoes, wear them for just a few hours a day. This prevents you from injuring your feet. Always look in your shoes before you put them on to be sure there are no objects inside.  Do not cross your legs. This may decrease the blood flow to your feet.  If you find a minor scrape,  cut, or break in the skin on your feet, keep it and the skin around it clean and dry. These areas may be cleansed with mild soap and water. Do not cleanse the area with peroxide, alcohol, or iodine.  When you remove an adhesive bandage, be sure not to damage the skin around it.  If you have a wound, look at it several times a day to make sure it is healing.  Do not use heating pads or hot water bottles. They may burn your skin. If you have lost feeling in your feet or legs, you may not know it is happening until it is too late.  Make sure your health care provider performs a complete foot exam at least annually or more often if you have foot problems. Report any cuts, sores, or bruises to your health care provider immediately. SEEK MEDICAL CARE IF:   You have an injury that is not healing.  You have cuts or breaks in the skin.  You have an ingrown nail.  You notice redness on your legs or feet.  You feel burning or tingling in your legs or feet.  You have pain or cramps in your legs and feet.  Your legs or feet are numb.  Your feet always feel cold. SEEK IMMEDIATE MEDICAL CARE IF:   There is increasing redness,   swelling, or pain in or around a wound.  There is a red line that goes up your leg.  Pus is coming from a wound.  You develop a fever or as directed by your health care provider.  You notice a bad smell coming from an ulcer or wound. Document Released: 11/07/2000 Document Revised: 07/13/2013 Document Reviewed: 04/19/2013 ExitCare Patient Information 2015 [employer]. This information is not intended to replace advice given to you by your health care provider. Make sure you discuss any questions you have with your health care provider.  

## 2014-09-15 NOTE — Progress Notes (Signed)
   Subjective:    Patient ID: Justin Hill, male    DOB: Apr 16, 1941, 73 y.o.   MRN: [id number]  HPI patient presents for diabetic foot and nail care no new changes or findings no    Review of Systems no systemic changes or findings on review of systems     Objective:   Physical Exam Vascular status is intact pedal pulses DP +2 PT plus one over 4 bilateral capillary refill time 3 seconds all digits nails thick criptotic incurvated and brittle and friable 1 through 5 left 134 and 5 right has partially fixation second toe right foot. No open wounds or ulcers no secondary infections no       Assessment & Plan:  Assessment diabetes with history peripheral neuropathy and dystrophy of nails thick dystrophic brittle crumbly friable mycotic nails debrided x9 return for future palliative nail care in 3 months  Harriet Masson DPM

## 2014-09-18 ENCOUNTER — Ambulatory Visit: Payer: Medicare Other | Admitting: Endocrinology

## 2014-09-19 DIAGNOSIS — C44519 Basal cell carcinoma of skin of other part of trunk: Secondary | ICD-10-CM | POA: Diagnosis not present

## 2014-09-21 ENCOUNTER — Encounter: Payer: Self-pay | Admitting: Endocrinology

## 2014-09-21 ENCOUNTER — Ambulatory Visit (INDEPENDENT_AMBULATORY_CARE_PROVIDER_SITE_OTHER): Payer: Medicare Other | Admitting: Endocrinology

## 2014-09-21 VITALS — BP 136/86 | HR 87 | Temp 97.8°F | Wt 257.0 lb

## 2014-09-21 DIAGNOSIS — N189 Chronic kidney disease, unspecified: Secondary | ICD-10-CM

## 2014-09-21 DIAGNOSIS — E1022 Type 1 diabetes mellitus with diabetic chronic kidney disease: Secondary | ICD-10-CM | POA: Diagnosis not present

## 2014-09-21 MED ORDER — INSULIN NPH (HUMAN) (ISOPHANE) 100 UNIT/ML ~~LOC~~ SUSP: SUBCUTANEOUS | Status: DC

## 2014-09-21 NOTE — Progress Notes (Signed)
Subjective:    Patient ID: Justin Hill, male    DOB: 04-Sep-1941, 73 y.o.   MRN: [id number]  HPI Pt returns for f/u of diabetes mellitus: DM type: Insulin-requiring type 2 Dx'ed: 123456 Complications: CHF, toe ulcer, partial toe amputation, and renal insufficiency. Therapy: insulin since DKA: never Severe hypoglycemia: never.   Pancreatitis: never. Other: he takes human insulin for cost reasons; in October of 2014, he was changed from multiple daily injections to NPH only, due to persistently high a1c Interval history: no cbg record, but states cbg's vary from 82-247, but most are in the 100's.  There is no trend throughout the day.  However, he says he has not been reducing am insulin for activity.   pt states he feels well in general. Past Medical History  Diagnosis Date  . Cardiomyopathy   . Personal history of kidney stones   . Cardiomyopathy   . CHF (congestive heart failure)   . Diabetes mellitus   . Hypercholesterolemia   . Hypertension   . Diverticulosis     Past Surgical History  Procedure Laterality Date  . Shoulder surgery  1981    left. Muscle reattachment  . Tonsillectomy    . Toe surgery Right 12/2012    2nd toe  . Colonoscopy N/A 12/29/2013    Procedure: COLONOSCOPY;  Surgeon: Lafayette Dragon, MD;  Location: [facility];  Service: Endoscopy;  Laterality: N/A;    History   Social History  . Marital Status: Married    Spouse Name: Rosemarie Beath    Number of Children: 1  . Years of Education: 12   Occupational History  . retired    Social History Main Topics  . Smoking status: Former Smoker    Types: Cigarettes  . Smokeless tobacco: Never Used  . Alcohol Use: No  . Drug Use: No  . Sexual Activity: Not on file   Other Topics Concern  . Not on file   Social History Narrative   Regular exercise-no          Current Outpatient Prescriptions on File Prior to Visit  Medication Sig Dispense Refill  . Ascorbic Acid (VITAMIN C) 500 MG CAPS Take 1  capsule by mouth 2 (two) times daily.       . Bilberry, Vaccinium myrtillus, (BILBERRY EXTRACT PO) Take by mouth. 1.000mg  once a day       . carvedilol (COREG) 25 MG tablet Take 1 tablet (25 mg total) by mouth 2 (two) times daily with a meal.  180 tablet  1  . Insulin Syringe-Needle U-100 31G X 5/16" 0.5 ML MISC 1 Syringe by Does not apply route 4 (four) times daily.  120 each  11  . Multiple Vitamin (MULTIVITAMIN) tablet Take 1 tablet by mouth daily.        . simvastatin (ZOCOR) 40 MG tablet Take 40 mg by mouth at bedtime.        . TRILIPIX 135 MG capsule Take 1 tablet by mouth daily.       No current facility-administered medications on file prior to visit.    Allergies  Allergen Reactions  . Aspirin   . Penicillins     Family History  Problem Relation Age of Onset  . Heart failure Maternal Grandmother   . Diabetes type II Sister 16  . Hyperlipidemia Other     Parent  . Stroke Other     Parent  . Hypertension Other     Parent  . Diabetes Other  Parent  . Colon cancer Neg Hx     BP 136/86  Pulse 87  Temp(Src) 97.8 F (36.6 C) (Oral)  Wt 257 lb (116.574 kg)  SpO2 95%  Review of Systems He denies hypoglycemia and n/v.      Objective:   Physical Exam VITAL SIGNS:  See vs page GENERAL: no distress EXTEMITIES: no deformity, except for hammer toes on the left foot. no ulcer on the feet. feet are of normal temp. 1+ bilat leg edema. There is bilateral onychomycosis, rust-colored discoloration, and varicosities.  The right 2nd toe is partially amputated.   PULSES: dorsalis pedis intact bilat.  NEURO: sensation is intact to touch on the feet.   outside test results are reviewed: A1c=8.4    Assessment & Plan:  DM: moderate exacerbation Obesity, persistent: he declines weight-loss surgery  Patient is advised the following: Patient Instructions  Please increase the insulin to 85 units each morning (50 if you are going to be active), and 35 units in the evening. On  this type of insulin schedule, you should eat meals on a regular schedule.  If a meal is missed or significantly delayed, your blood sugar could go low.   check your blood sugar twice a day.  vary the time of day when you check, between before the 3 meals, and at bedtime.  also check if you have symptoms of your blood sugar being too high or too low.  please keep a record of the readings and bring it to your next appointment here.  please call us sooner if your blood sugar goes below 70, or if you have a lot of readings over 200.    Please come back for a follow-up appointment in 3 months.

## 2014-09-21 NOTE — Patient Instructions (Addendum)
Please increase the insulin to 85 units each morning (50 if you are going to be active), and 35 units in the evening. On this type of insulin schedule, you should eat meals on a regular schedule.  If a meal is missed or significantly delayed, your blood sugar could go low.   check your blood sugar twice a day.  vary the time of day when you check, between before the 3 meals, and at bedtime.  also check if you have symptoms of your blood sugar being too high or too low.  please keep a record of the readings and bring it to your next appointment here.  please call us sooner if your blood sugar goes below 70, or if you have a lot of readings over 200.    Please come back for a follow-up appointment in 3 months.

## 2014-10-23 ENCOUNTER — Telehealth: Payer: Self-pay | Admitting: Endocrinology

## 2014-10-23 NOTE — Telephone Encounter (Signed)
Patient ask id Dr would make correction in insulin injection, her husband take 85 units in the morning and 50 units at night (not 35 units at night) and he also need 30 day of supply insulin not 4 vials.

## 2014-10-24 MED ORDER — INSULIN NPH (HUMAN) (ISOPHANE) 100 UNIT/ML ~~LOC~~ SUSP: SUBCUTANEOUS | Status: DC

## 2014-10-24 NOTE — Telephone Encounter (Signed)
ok 

## 2014-10-24 NOTE — Telephone Encounter (Signed)
Rx sent to pharmacy   

## 2014-10-24 NOTE — Telephone Encounter (Signed)
See below. Contacted pt. He is currently taking 85 units in the morning and 50 units at night. On this insulin schedule pt's readings have been running anywhere from 102-148. Pt sates that he has not had any readings over 200 or below 70. Medication list currently states the pt is taking 85 units in the morning and 35 units at night. Ok to change RX? Thanks!

## 2014-12-21 ENCOUNTER — Encounter: Payer: Self-pay | Admitting: Endocrinology

## 2014-12-21 ENCOUNTER — Ambulatory Visit (INDEPENDENT_AMBULATORY_CARE_PROVIDER_SITE_OTHER): Payer: Medicare Other | Admitting: Endocrinology

## 2014-12-21 VITALS — BP 142/88 | HR 84 | Temp 97.4°F | Ht 71.0 in | Wt 259.0 lb

## 2014-12-21 DIAGNOSIS — N189 Chronic kidney disease, unspecified: Secondary | ICD-10-CM | POA: Diagnosis not present

## 2014-12-21 DIAGNOSIS — E1022 Type 1 diabetes mellitus with diabetic chronic kidney disease: Secondary | ICD-10-CM

## 2014-12-21 LAB — BASIC METABOLIC PANEL
BUN: 22 mg/dL (ref 6–23)
CO2: 26 mEq/L (ref 19–32)
Calcium: 9 mg/dL (ref 8.4–10.5)
Chloride: 104 mEq/L (ref 96–112)
Creatinine, Ser: 1.36 mg/dL (ref 0.40–1.50)
GFR: 54.78 mL/min — AB (ref >60.00)
Glucose, Bld: 124 mg/dL — ABNORMAL HIGH (ref 70–99)
Potassium: 4.9 mEq/L (ref 3.5–5.1)
Sodium: 138 mEq/L (ref 135–145)

## 2014-12-21 LAB — MICROALBUMIN / CREATININE URINE RATIO
Creatinine,U: 110.7 mg/dL
MICROALB UR: 4.2 mg/dL — AB (ref 0.0–1.9)
Microalb Creat Ratio: 3.8 mg/g (ref 0.0–30.0)

## 2014-12-21 LAB — HEMOGLOBIN A1C: HEMOGLOBIN A1C: 7.4 % — AB (ref 4.6–6.5)

## 2014-12-21 MED ORDER — INSULIN NPH (HUMAN) (ISOPHANE) 100 UNIT/ML ~~LOC~~ SUSP: SUBCUTANEOUS | Status: DC

## 2014-12-21 NOTE — Patient Instructions (Addendum)
Please continue the insulin, 85 units each morning (50 if you are going to be active), and 50 units in the evening. On this type of insulin schedule, you should eat meals on a regular schedule.  If a meal is missed or significantly delayed, your blood sugar could go low.   check your blood sugar twice a day.  vary the time of day when you check, between before the 3 meals, and at bedtime.  also check if you have symptoms of your blood sugar being too high or too low.  please keep a record of the readings and bring it to your next appointment here.  please call us sooner if your blood sugar goes below 70, or if you have a lot of readings over 200.   If you have any blood sugar under 80, please write on the paper why you think that Please come back for a follow-up appointment in 3 months.

## 2014-12-21 NOTE — Progress Notes (Signed)
Subjective:    Patient ID: Justin Hill, male    DOB: 01/03/1941, 74 y.o.   MRN: [id number]  HPI Pt returns for f/u of diabetes mellitus: DM type: Insulin-requiring type 2. Dx'ed: 123456 Complications: CHF, toe ulcer, partial toe amputation, and renal insufficiency.  Therapy: insulin since 2012.  DKA: never.   Severe hypoglycemia: never.   Pancreatitis: never. Other: he takes human insulin for cost reasons; in October of 2014, he was changed from multiple daily injections to NPH only, due to persistently high a1c.   Interval history: he brings a record of his cbg's which i have reviewed today.  It varies from 52-206, but most are in the 100's.  There is no trend throughout the day. Wife says cbg varies according to diet.   Past Medical History  Diagnosis Date  . Cardiomyopathy   . Personal history of kidney stones   . Cardiomyopathy   . CHF (congestive heart failure)   . Diabetes mellitus   . Hypercholesterolemia   . Hypertension   . Diverticulosis     Past Surgical History  Procedure Laterality Date  . Shoulder surgery  1981    left. Muscle reattachment  . Tonsillectomy    . Toe surgery Right 12/2012    2nd toe  . Colonoscopy N/A 12/29/2013    Procedure: COLONOSCOPY;  Surgeon: Lafayette Dragon, MD;  Location: [facility];  Service: Endoscopy;  Laterality: N/A;    History   Social History  . Marital Status: Married    Spouse Name: Rosemarie Beath    Number of Children: 1  . Years of Education: 12   Occupational History  . retired    Social History Main Topics  . Smoking status: Former Smoker    Types: Cigarettes  . Smokeless tobacco: Never Used  . Alcohol Use: No  . Drug Use: No  . Sexual Activity: Not on file   Other Topics Concern  . Not on file   Social History Narrative   Regular exercise-no          Current Outpatient Prescriptions on File Prior to Visit  Medication Sig Dispense Refill  . acetaminophen (TYLENOL) 325 MG tablet Take 650 mg by mouth  every 6 (six) hours as needed.    . Ascorbic Acid (VITAMIN C) 500 MG CAPS Take 1 capsule by mouth 2 (two) times daily.     . Bilberry, Vaccinium myrtillus, (BILBERRY EXTRACT PO) Take by mouth. 1.000mg  once a day     . carvedilol (COREG) 25 MG tablet Take 1 tablet (25 mg total) by mouth 2 (two) times daily with a meal. 180 tablet 1  . Insulin Syringe-Needle U-100 31G X 5/16" 0.5 ML MISC 1 Syringe by Does not apply route 4 (four) times daily. 120 each 11  . Multiple Vitamin (MULTIVITAMIN) tablet Take 1 tablet by mouth daily.      . simvastatin (ZOCOR) 40 MG tablet Take 40 mg by mouth at bedtime.      . TRILIPIX 135 MG capsule Take 1 tablet by mouth daily.    Marland Kitchen oxyCODONE-acetaminophen (PERCOCET/ROXICET) 5-325 MG per tablet      No current facility-administered medications on file prior to visit.    Allergies  Allergen Reactions  . Aspirin   . Penicillins     Family History  Problem Relation Age of Onset  . Heart failure Maternal Grandmother   . Diabetes type II Sister 71  . Hyperlipidemia Other     Parent  . Stroke  Other     Parent  . Hypertension Other     Parent  . Diabetes Other     Parent  . Colon cancer Neg Hx     BP 142/88 mmHg  Pulse 84  Temp(Src) 97.4 F (36.3 C) (Oral)  Ht 5\' 11"  (1.803 m)  Wt 259 lb (117.482 kg)  BMI 36.14 kg/m2  SpO2 98%    Review of Systems Denies LOC and weight change.      Objective:   Physical Exam VITAL SIGNS:  See vs page GENERAL: no distress EXTEMITIES: no deformity, except for hammer toes on the left foot. no ulcer on the feet. feet are of normal temp. 1+ bilat leg edema. There is bilateral onychomycosis, rust-colored discoloration, and varicosities.  PULSES: dorsalis pedis intact bilat.  NEURO: sensation is intact to touch on the feet.  Lab Results  Component Value Date   HGBA1C 7.4* 12/21/2014      Assessment & Plan:  DM: this is the best control this pt should aim for, given this regimen, which does match insulin to her  changing needs throughout the day Side-effect of rx: mild hypoglycemia: this is weighed against the benefits of glycemic control.  Patient is advised the following: Patient Instructions  Please continue the insulin, 85 units each morning (50 if you are going to be active), and 50 units in the evening. On this type of insulin schedule, you should eat meals on a regular schedule.  If a meal is missed or significantly delayed, your blood sugar could go low.   check your blood sugar twice a day.  vary the time of day when you check, between before the 3 meals, and at bedtime.  also check if you have symptoms of your blood sugar being too high or too low.  please keep a record of the readings and bring it to your next appointment here.  please call us sooner if your blood sugar goes below 70, or if you have a lot of readings over 200.   If you have any blood sugar under 80, please write on the paper why you think that Please come back for a follow-up appointment in 3 months.

## 2014-12-22 ENCOUNTER — Ambulatory Visit: Payer: Medicare Other

## 2014-12-25 ENCOUNTER — Ambulatory Visit (INDEPENDENT_AMBULATORY_CARE_PROVIDER_SITE_OTHER): Payer: Medicare Other | Admitting: Cardiology

## 2014-12-25 ENCOUNTER — Encounter: Payer: Self-pay | Admitting: *Deleted

## 2014-12-25 ENCOUNTER — Ambulatory Visit (INDEPENDENT_AMBULATORY_CARE_PROVIDER_SITE_OTHER): Payer: Medicare Other

## 2014-12-25 ENCOUNTER — Encounter: Payer: Self-pay | Admitting: Cardiology

## 2014-12-25 VITALS — BP 142/58 | HR 82 | Ht 71.0 in | Wt 259.0 lb

## 2014-12-25 VITALS — BP 123/68 | HR 80 | Resp 18

## 2014-12-25 DIAGNOSIS — E7800 Pure hypercholesterolemia, unspecified: Secondary | ICD-10-CM

## 2014-12-25 DIAGNOSIS — Q828 Other specified congenital malformations of skin: Secondary | ICD-10-CM

## 2014-12-25 DIAGNOSIS — I428 Other cardiomyopathies: Secondary | ICD-10-CM

## 2014-12-25 DIAGNOSIS — N183 Chronic kidney disease, stage 3 unspecified: Secondary | ICD-10-CM

## 2014-12-25 DIAGNOSIS — I429 Cardiomyopathy, unspecified: Secondary | ICD-10-CM | POA: Diagnosis not present

## 2014-12-25 DIAGNOSIS — I5022 Chronic systolic (congestive) heart failure: Secondary | ICD-10-CM | POA: Diagnosis not present

## 2014-12-25 DIAGNOSIS — L603 Nail dystrophy: Secondary | ICD-10-CM | POA: Diagnosis not present

## 2014-12-25 DIAGNOSIS — I447 Left bundle-branch block, unspecified: Secondary | ICD-10-CM | POA: Diagnosis not present

## 2014-12-25 DIAGNOSIS — E1140 Type 2 diabetes mellitus with diabetic neuropathy, unspecified: Secondary | ICD-10-CM

## 2014-12-25 NOTE — Progress Notes (Signed)
   Subjective:    Patient ID: Justin Hill, male    DOB: 1941/09/24, 74 y.o.   MRN: [id number]  HPI I AM HERE TO GET MY TOENAILS TRIMMED UP    Review of Systems no new findings or systemic changes noted    Objective:   Physical Exam Neurovascular status is intact and unchanged pedal pulses are palpable DP +2 PT plus one over 4 bilateral capillary refill time 3 seconds all digits epicritic and proprioceptive sensations diminished on Semmes Weinstein to the forefoot digits and arch bilateral. Nails thick brittle [place] friable dystrophic 1 through 5 bilateral patient's had an amputation second toe right. Diabetes complications again multiple painful mycotic nails dystrophic nails brittle are debrided at this time. There is also keratoses sub-fourth and fifth metatarsal left no open wounds no ulcers no secondary infections patient is digital contractures and plantar flexed metatarsal fat pad atrophy noted.       Assessment & Plan:  Assessment diabetes with history peripheral neuropathy multiple dystrophic friable nails debrided 9. Also debrided keratoses sub-fourth and fifth metatarsal left. Patient will maintain using lotion daily following debridement both hallux are treating with lumicain Neosporin. Follow-up for suture palliative care every 3 months  Harriet Masson DPM

## 2014-12-25 NOTE — Patient Instructions (Signed)
Diabetes and Foot Care Diabetes may cause you to have problems because of poor blood supply (circulation) to your feet and legs. This may cause the skin on your feet to become thinner, break easier, and heal more slowly. Your skin may become dry, and the skin may peel and crack. You may also have nerve damage in your legs and feet causing decreased feeling in them. You may not notice minor injuries to your feet that could lead to infections or more serious problems. Taking care of your feet is one of the most important things you can do for yourself.  HOME CARE INSTRUCTIONS  Wear shoes at all times, even in the house. Do not go barefoot. Bare feet are easily injured.  Check your feet daily for blisters, cuts, and redness. If you cannot see the bottom of your feet, use a mirror or ask someone for help.  Wash your feet with warm water (do not use hot water) and mild soap. Then pat your feet and the areas between your toes until they are completely dry. Do not soak your feet as this can dry your skin.  Apply a moisturizing lotion or petroleum jelly (that does not contain alcohol and is unscented) to the skin on your feet and to dry, brittle toenails. Do not apply lotion between your toes.  Trim your toenails straight across. Do not dig under them or around the cuticle. File the edges of your nails with an emery board or nail file.  Do not cut corns or calluses or try to remove them with medicine.  Wear clean socks or stockings every day. Make sure they are not too tight. Do not wear knee-high stockings since they may decrease blood flow to your legs.  Wear shoes that fit properly and have enough cushioning. To break in new shoes, wear them for just a few hours a day. This prevents you from injuring your feet. Always look in your shoes before you put them on to be sure there are no objects inside.  Do not cross your legs. This may decrease the blood flow to your feet.  If you find a minor scrape,  cut, or break in the skin on your feet, keep it and the skin around it clean and dry. These areas may be cleansed with mild soap and water. Do not cleanse the area with peroxide, alcohol, or iodine.  When you remove an adhesive bandage, be sure not to damage the skin around it.  If you have a wound, look at it several times a day to make sure it is healing.  Do not use heating pads or hot water bottles. They may burn your skin. If you have lost feeling in your feet or legs, you may not know it is happening until it is too late.  Make sure your health care provider performs a complete foot exam at least annually or more often if you have foot problems. Report any cuts, sores, or bruises to your health care provider immediately. SEEK MEDICAL CARE IF:   You have an injury that is not healing.  You have cuts or breaks in the skin.  You have an ingrown nail.  You notice redness on your legs or feet.  You feel burning or tingling in your legs or feet.  You have pain or cramps in your legs and feet.  Your legs or feet are numb.  Your feet always feel cold. SEEK IMMEDIATE MEDICAL CARE IF:   There is increasing redness,   swelling, or pain in or around a wound.  There is a red line that goes up your leg.  Pus is coming from a wound.  You develop a fever or as directed by your health care provider.  You notice a bad smell coming from an ulcer or wound. Document Released: 11/07/2000 Document Revised: 07/13/2013 Document Reviewed: 04/19/2013 ExitCare Patient Information 2015 [employer]. This information is not intended to replace advice given to you by your health care provider. Make sure you discuss any questions you have with your health care provider.  

## 2014-12-25 NOTE — Patient Instructions (Signed)
Your physician has requested that you have an echocardiogram. Echocardiography is a painless test that uses sound waves to create images of your heart. It provides your doctor with information about the size and shape of your heart and how well your heart's chambers and valves are working. This procedure takes approximately one hour. There are no restrictions for this procedure. July 2016  Your physician wants you to follow-up in: July 2016 with Dr Aundra Dubin. You will receive a reminder letter in the mail two months in advance. If you don't receive a letter, please call our office to schedule the follow-up appointment.  YOU CAN HAVE THE ECHOCARDIOGRAM THE SAME DAY YOU SEE DR [facility] IN July 2016

## 2014-12-26 DIAGNOSIS — I447 Left bundle-branch block, unspecified: Secondary | ICD-10-CM | POA: Insufficient documentation

## 2014-12-26 NOTE — Progress Notes (Signed)
Patient ID: Justin Hill, male   DOB: 05-27-1941, 74 y.o.   MRN: [id number] PCP: Dr. Humphrey Rolls [facility])  74 yo with history CKD, HTN, diabetes, chronic LBBB, and nonischemic cardiomyopathy presents for cardiology followup.  His cardiomyopathy has been known for years.  Most recent echo in 7/15 showed EF 25-30% with septal-lateral dyssynchrony, which is stable.  He had a LHC in 2008 showing mild nonobstructive CAD.  An ICD was considered but decided against in the past given NYHA class I symptoms.  He is only on Coreg as he had hyperkalemia/AKI with ACEI use.     Currently, patient remains symptomatically stable. He denies exertional dyspnea.  He can walk up stairs and do yardwork with no problems.  No orthopnea/PND.  No palpitations.  No chest pain.  He does some walking with his wife.  Weight is up 5 lbs. I do not think he is particularly active, but he has refused CPX for full evaluation in the past.  Denies significant snoring, daytime sleepiness.   Labs (5/14): K 4, creatinine 1.4, LFTs normal, LDL 46, HDL 37 Labs (1/15): K 4.4, creatinine 1.46, LDL 74, HDL 35 Labs (4/15); LDL 96, HDL 44, K 5.2, creatinine 1.4 Labs (1/16): K 4.9, creatinine 1.36  ECG: NSR, LBBB  PMH: 1. Type II diabetes with peripheral neuropathy.  2. CKD 3. Hyperlipidemia 4. HTN 5. Nonischemic cardiomyopathy: This was diagnosed prior to 2008.  In 2008, patient had LHC with only mild nonobstructive coronary disease.  Echo in 1/14 showed EF 25-30% with normal RV.  Patient has not tolerated ACEI due to hyperkalemia and AKI. ICD was discussed (Dr Lovena Le) and decided against given NYHA class I symptoms.  Echo (7/15) with EF 25-30%, septal-lateral dyssynchrony, mild LV dilation.  6. Chronic LBBB 7. Obesity  SH: Lives in [place], married, prior smoker, no ETOH.  1 child.   FH: Grandmother with CHF.  Mother with CVA.   ROS: All systems reviewed and negative except as per HPI.    Current Outpatient Prescriptions   Medication Sig Dispense Refill  . acetaminophen (TYLENOL) 325 MG tablet Take 650 mg by mouth every 6 (six) hours as needed.    . Ascorbic Acid (VITAMIN C) 500 MG CAPS Take 1 capsule by mouth 2 (two) times daily.     . Bilberry, Vaccinium myrtillus, (BILBERRY EXTRACT PO) Take by mouth. 1.000mg  once a day     . carvedilol (COREG) 25 MG tablet Take 1 tablet (25 mg total) by mouth 2 (two) times daily with a meal. 180 tablet 1  . CINNAMON PO Take by mouth.    . insulin NPH Human (HUMULIN N) 100 UNIT/ML injection 85 units each morning, and 50 units each evening 50 mL 11  . Insulin Syringe-Needle U-100 31G X 5/16" 0.5 ML MISC 1 Syringe by Does not apply route 4 (four) times daily. 120 each 11  . Multiple Vitamin (MULTIVITAMIN) tablet Take 1 tablet by mouth daily.      . Probiotic Product (PROBIOTIC DAILY PO) Take by mouth.    . simvastatin (ZOCOR) 40 MG tablet Take 40 mg by mouth at bedtime.      . TRILIPIX 135 MG capsule Take 1 tablet by mouth daily.     No current facility-administered medications for this visit.    BP 142/58 mmHg  Pulse 82  Ht 5\' 11"  (1.803 m)  Wt 259 lb (117.482 kg)  BMI 36.14 kg/m2 General: NAD, obese Neck: Thick, no JVD, no thyromegaly or thyroid nodule.  Lungs: Clear to auscultation bilaterally with normal respiratory effort. CV: Nondisplaced PMI.  Heart regular S1/S2, no S3/S4, no murmur.  1+ ankle edema.  No carotid bruit.  Normal pedal pulses.  Abdomen: Soft, nontender, no hepatosplenomegaly, no distention.  Skin: Intact without lesions or rashes.  Neurologic: Alert and oriented x 3.  Psych: Normal affect. Extremities: No clubbing or cyanosis.   Assessment/Plan: 1. Cardiomyopathy: Nonischemic.  Low EF x years. Most recent echo in 7/15 showed stable EF 25-30% with septal-lateral dyssynchrony, which is stable.  By description, symptoms are NYHA class I.  Therefore, he does not qualify for CRT-D. However, I do not think that he is particularly active. He says that  he was told in the past that his cardiomyopathy may have been related to viral myocarditis.  Interestingly, it also appears that he has a long-standing LBBB.  A chronic LBBB itself can potentially cause a cardiomyopathy and may be the source of his low EF.   - Continue Coreg, he is at goal dose. - He has not been on ACEI or spironolactone due to AKI and hyperkalemia.  K was high normal on most recent labs.  - I again suggested that he do a cardiopulmonary exercise test to show [place] if he really is NYHA class I or if his functional capacity is really worse than he realizes.  If were to do poorly on the CPX, I think that it would be reasonable to pursue CRT-D.  He has a wide LBBB, and his EF would be reasonably likely to improve with CRT. He still wants to hold off on this.  - I will get a repeat echo in 7/16 and have him followup at that time.   - Would like to see him exercise and work on weight loss.  2. Hyperlipidemia: Patient is on simvastatin with good lipids in 4/15.  3. CKD: This has remained stable, most recent creatinine 1.36. 4. Patient does not take aspirin due to GI intolerance.    Loralie Champagne 12/26/2014

## 2014-12-26 NOTE — Addendum Note (Signed)
Addended by: Katrine Coho on: 12/26/2014 06:23 PM   Modules accepted: Orders

## 2015-01-05 ENCOUNTER — Emergency Department (HOSPITAL_COMMUNITY): Payer: Medicare Other

## 2015-01-05 ENCOUNTER — Encounter (HOSPITAL_COMMUNITY): Payer: Self-pay | Admitting: *Deleted

## 2015-01-05 ENCOUNTER — Inpatient Hospital Stay (HOSPITAL_COMMUNITY)
Admission: EM | Admit: 2015-01-05 | Discharge: 2015-01-07 | DRG: 309 | Disposition: A | Discharge status: Home / Self Care | Payer: Medicare Other | Attending: Cardiovascular Disease | Admitting: Cardiovascular Disease

## 2015-01-05 DIAGNOSIS — Z79899 Other long term (current) drug therapy: Secondary | ICD-10-CM

## 2015-01-05 DIAGNOSIS — I509 Heart failure, unspecified: Secondary | ICD-10-CM | POA: Diagnosis not present

## 2015-01-05 DIAGNOSIS — I429 Cardiomyopathy, unspecified: Secondary | ICD-10-CM | POA: Diagnosis present

## 2015-01-05 DIAGNOSIS — R000 Tachycardia, unspecified: Secondary | ICD-10-CM

## 2015-01-05 DIAGNOSIS — N189 Chronic kidney disease, unspecified: Secondary | ICD-10-CM | POA: Diagnosis present

## 2015-01-05 DIAGNOSIS — R0602 Shortness of breath: Secondary | ICD-10-CM | POA: Diagnosis not present

## 2015-01-05 DIAGNOSIS — R069 Unspecified abnormalities of breathing: Secondary | ICD-10-CM | POA: Diagnosis not present

## 2015-01-05 DIAGNOSIS — Z87891 Personal history of nicotine dependence: Secondary | ICD-10-CM

## 2015-01-05 DIAGNOSIS — R42 Dizziness and giddiness: Secondary | ICD-10-CM | POA: Diagnosis not present

## 2015-01-05 DIAGNOSIS — I472 Ventricular tachycardia: Secondary | ICD-10-CM

## 2015-01-05 DIAGNOSIS — I4891 Unspecified atrial fibrillation: Secondary | ICD-10-CM | POA: Diagnosis not present

## 2015-01-05 DIAGNOSIS — I471 Supraventricular tachycardia: Secondary | ICD-10-CM | POA: Diagnosis not present

## 2015-01-05 DIAGNOSIS — I129 Hypertensive chronic kidney disease with stage 1 through stage 4 chronic kidney disease, or unspecified chronic kidney disease: Secondary | ICD-10-CM | POA: Diagnosis present

## 2015-01-05 DIAGNOSIS — E119 Type 2 diabetes mellitus without complications: Secondary | ICD-10-CM | POA: Diagnosis not present

## 2015-01-05 DIAGNOSIS — Z6835 Body mass index (BMI) 35.0-35.9, adult: Secondary | ICD-10-CM

## 2015-01-05 DIAGNOSIS — I447 Left bundle-branch block, unspecified: Secondary | ICD-10-CM

## 2015-01-05 DIAGNOSIS — I10 Essential (primary) hypertension: Secondary | ICD-10-CM | POA: Insufficient documentation

## 2015-01-05 DIAGNOSIS — E669 Obesity, unspecified: Secondary | ICD-10-CM | POA: Insufficient documentation

## 2015-01-05 DIAGNOSIS — E780 Pure hypercholesterolemia: Secondary | ICD-10-CM | POA: Diagnosis present

## 2015-01-05 DIAGNOSIS — Z794 Long term (current) use of insulin: Secondary | ICD-10-CM

## 2015-01-05 LAB — I-STAT TROPONIN, ED: Troponin i, poc: 0.05 ng/mL (ref 0.00–0.08)

## 2015-01-05 LAB — CBC
HCT: 43.3 % (ref 39.0–52.0)
HEMOGLOBIN: 15 g/dL (ref 13.0–17.0)
MCH: 29.2 pg (ref 26.0–34.0)
MCHC: 34.6 g/dL (ref 30.0–36.0)
MCV: 84.2 fL (ref 78.0–100.0)
Platelets: 155 10*3/uL (ref 150–400)
RBC: 5.14 MIL/uL (ref 4.22–5.81)
RDW: 13.5 % (ref 11.5–15.5)
WBC: 7.1 10*3/uL (ref 4.0–10.5)

## 2015-01-05 LAB — TSH: TSH: 3.59 u[IU]/mL (ref 0.350–4.500)

## 2015-01-05 LAB — BASIC METABOLIC PANEL
ANION GAP: 9 (ref 5–15)
BUN: 15 mg/dL (ref 6–23)
CALCIUM: 9.3 mg/dL (ref 8.4–10.5)
CO2: 26 mmol/L (ref 19–32)
Chloride: 101 mmol/L (ref 96–112)
Creatinine, Ser: 1.52 mg/dL — ABNORMAL HIGH (ref 0.50–1.35)
GFR calc Af Amer: 51 mL/min — ABNORMAL LOW (ref >90)
GFR calc non Af Amer: 44 mL/min — ABNORMAL LOW (ref >90)
GLUCOSE: 252 mg/dL — AB (ref 70–99)
Potassium: 4.5 mmol/L (ref 3.5–5.1)
Sodium: 136 mmol/L (ref 135–145)

## 2015-01-05 LAB — BRAIN NATRIURETIC PEPTIDE: B NATRIURETIC PEPTIDE 5: 99.3 pg/mL (ref 0.0–100.0)

## 2015-01-05 MED ORDER — AMIODARONE HCL IN DEXTROSE 360-4.14 MG/200ML-% IV SOLN
60.0000 mg/h | INTRAVENOUS | Status: DC
  Administered 2015-01-05: 60 mg/h via INTRAVENOUS
  Filled 2015-01-05 (×2): qty 200

## 2015-01-05 MED ORDER — AMIODARONE LOAD VIA INFUSION
150.0000 mg | Freq: Once | INTRAVENOUS | Status: AC
  Administered 2015-01-05: 150 mg via INTRAVENOUS
  Filled 2015-01-05: qty 83.34

## 2015-01-05 MED ORDER — AMIODARONE HCL IN DEXTROSE 360-4.14 MG/200ML-% IV SOLN
30.0000 mg/h | INTRAVENOUS | Status: DC
  Administered 2015-01-06: 30 mg/h via INTRAVENOUS
  Filled 2015-01-05: qty 200

## 2015-01-05 NOTE — ED Notes (Signed)
Family at bedside. 

## 2015-01-05 NOTE — ED Notes (Addendum)
PT to ED via [facility] as a transfer from [facility] urgent Care. Pt was ambulating into [employer] and had a sudden onset of shortness of breath, dizziness, and weakness. Pt tachycardic at 149. Denies chest pain. Pt reports cardiologist has recommended a pacemaker

## 2015-01-05 NOTE — ED Provider Notes (Addendum)
CSN: [id number]     Arrival date & time 01/05/15  2034 History   First MD Initiated Contact with Patient 01/05/15 2050     Chief Complaint  Patient presents with  . Tachycardia      HPI  Patient presents for evaluation of weakness and shortness of breath and near syncope.  Has history of known nonischemic cardiomyopathy with a baseline ejection fraction of 23. Normally functions with only NYHA I symptoms. 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 He walked from the parking lot to the back of the store. The time he got to the neck history states is very short of breath and had to sit down and almost lay down. He tried to walk out. He is only able to walk 2-20 feet at a time limited by dyspnea and lightheadedness. Ultimately after getting up from the store paramedics were called and he was brought here. Was tachycardic with an irregular wide complex rhythm en route.  Chest pain. Denies recent shortness of breat above his baseline, until his episode tonight. Recent cough sputum production. No change in medications. Urinating normally. No rapid weight gain or recent changes in his weight.  He is had a discussion with the cardiologist but the possibility of a tachycardia device/AICD. Does not currently have one. Has no episodes of arrhythmia or atrial fibrillation in the past.  Past Medical History  Diagnosis Date  . Cardiomyopathy   . Personal history of kidney stones   . Cardiomyopathy   . CHF (congestive heart failure)   . Diabetes mellitus   . Hypercholesterolemia   . Hypertension   . Diverticulosis    Past Surgical History  Procedure Laterality Date  . Shoulder surgery  1981    left. Muscle reattachment  . Tonsillectomy    . Toe surgery Right 12/2012    2nd toe  . Colonoscopy N/A 12/29/2013    Procedure: COLONOSCOPY;  Surgeon: Lafayette Dragon, MD;  Location: [facility];  Service: Endoscopy;  Laterality: N/A;   Family History  Problem Relation Age of Onset  . Heart  failure Maternal Grandmother   . Diabetes type II Sister 67  . Hyperlipidemia Other     Parent  . Stroke Other     Parent  . Hypertension Other     Parent  . Diabetes Other     Parent  . Colon cancer Neg Hx    History  Substance Use Topics  . Smoking status: Former Smoker    Types: Cigarettes  . Smokeless tobacco: Never Used  . Alcohol Use: No    Review of Systems  Constitutional: Negative for fever, chills, diaphoresis, appetite change and fatigue.  HENT: Negative for mouth sores, sore throat and trouble swallowing.   Eyes: Negative for visual disturbance.  Respiratory: Positive for shortness of breath. Negative for cough, chest tightness and wheezing.   Cardiovascular: Positive for palpitations. Negative for chest pain.  Gastrointestinal: Negative for nausea, vomiting, abdominal pain, diarrhea and abdominal distention.  Endocrine: Negative for polydipsia, polyphagia and polyuria.  Genitourinary: Negative for dysuria, frequency and hematuria.  Musculoskeletal: Negative for gait problem.  Skin: Negative for color change, pallor and rash.  Neurological: Positive for weakness and light-headedness. Negative for dizziness, syncope and headaches.  Hematological: Does not bruise/bleed easily.  Psychiatric/Behavioral: Negative for behavioral problems and confusion.      Allergies  Aspirin and Penicillins  Home Medications   Prior to Admission medications   Medication Sig Start Date End Date Taking?  Authorizing Provider  Ascorbic Acid (VITAMIN C) 500 MG CAPS Take 1 capsule by mouth 2 (two) times daily.    Yes Historical Provider, MD  Bilberry, Vaccinium myrtillus, (BILBERRY EXTRACT PO) Take 1,000 mg by mouth daily. 1.000mg  once a day   Yes Historical Provider, MD  carvedilol (COREG) 25 MG tablet Take 1 tablet (25 mg total) by mouth 2 (two) times daily with a meal.   Yes Larey Dresser, MD  Chlorpheniramine-APAP 2-325 MG TABS Take 1 tablet by mouth daily as needed (for runny  nose).   Yes Historical Provider, MD  CINNAMON PO Take 1 tablet by mouth every evening.    Yes Historical Provider, MD  diphenhydramine-acetaminophen (TYLENOL PM) 25-500 MG TABS Take 2 tablets by mouth at bedtime.   Yes Historical Provider, MD  insulin NPH Human (HUMULIN N) 100 UNIT/ML injection 85 units each morning, and 50 units each evening Patient taking differently: Inject 50-85 Units into the skin 2 (two) times daily before a meal. 85 units each morning, and 50 units each evening 12/21/14  Yes Renato Shin, MD  Multiple Vitamin (MULTIVITAMIN) tablet Take 1 tablet by mouth daily.     Yes Historical Provider, MD  simvastatin (ZOCOR) 40 MG tablet Take 40 mg by mouth at bedtime.     Yes Historical Provider, MD  TRILIPIX 135 MG capsule Take 1 tablet by mouth daily. 03/14/12  Yes Historical Provider, MD   BP 116/68 mmHg  Pulse 138  Temp(Src) 97.6 F (36.4 C) (Oral)  Resp 21  SpO2 96% Physical Exam  Constitutional: He is oriented to person, place, and time. He appears well-developed and well-nourished. No distress.  HENT:  Head: Normocephalic.  Eyes: Conjunctivae are normal. Pupils are equal, round, and reactive to light. No scleral icterus.  Neck: Normal range of motion. Neck supple. No thyromegaly present.  Cardiovascular: An irregular rhythm present. Tachycardia present.  Exam reveals no gallop and no friction rub.   No murmur heard. Wide complex irregular rhythm on the monitor.  Pulmonary/Chest: Effort normal and breath sounds normal. No respiratory distress. He has no wheezes. He has no rales.  Clear lungs. He is dyspneic with conversation.  Abdominal: Soft. Bowel sounds are normal. He exhibits no distension. There is no tenderness. There is no rebound.  Musculoskeletal: Normal range of motion.  Neurological: He is alert and oriented to person, place, and time.  Skin: Skin is warm and dry. No rash noted.  Psychiatric: He has a normal mood and affect. His behavior is normal.    ED  Course  Procedures (including critical care time) Labs Review Labs Reviewed  BASIC METABOLIC PANEL - Abnormal; Notable for the following:    Glucose, Bld 252 (*)    Creatinine, Ser 1.52 (*)    GFR calc non Af Amer 44 (*)    GFR calc Af Amer 51 (*)    All other components within normal limits  CBC  TSH  BRAIN NATRIURETIC PEPTIDE  MAGNESIUM  I-STAT TROPOININ, ED    Imaging Review Dg Chest Port 1 View  01/05/2015   CLINICAL DATA:  Dizziness. Shortness of breath. Tachycardia. Cardiomyopathy.  EXAM: PORTABLE CHEST - 1 VIEW  COMPARISON:  02/23/2009  FINDINGS: Low lung volumes are noted as well as lordotic positioning. Both lungs are grossly clear. Heart size is within normal limits.  IMPRESSION: No acute findings.   Electronically Signed   By: Earle Gell M.D.   On: 01/05/2015 21:35     EKG Interpretation   Date/Time:  Friday January 05 2015 20:42:52 EST Ventricular Rate:  143 PR Interval:  184 QRS Duration: 154 QT Interval:  370 QTC Calculation: 571 R Axis:   116 Text Interpretation:  Irregular Wide complex tachycardia Confirmed by  Jeneen Rinks  MD, [place] ([id number]) on 01/05/2015 8:51:52 PM      CRITICAL CARE Performed by: Tanna Furry JOSEPH     Total critical care time: 42 Start 10:53pm Stop:  11:53pm  Critical care time was exclusive of separately billable procedures and treating other patients. Included initiation of Amiodarone bolus and infusion.  Critical care was necessary to treat or prevent imminent or life-threatening deterioration.  Critical care was time spent personally by me on the following activities: development of treatment plan with patient and/or surrogate as well as nursing, discussions with consultants, evaluation of patient's response to treatment, examination of patient, obtaining history from patient or surrogate, ordering and performing treatments and interventions, ordering and review of laboratory studies, ordering and review of radiographic studies, pulse  oximetry and re-evaluation of patient's condition.   MDM   Final diagnoses:  Atrial fibrillation with rapid ventricular response  Left bundle branch block    EKG obtained upon the patient's arrival shows a wide complex irregular rhythm rate 140s. I am able to access this chart immediately. Per Dr. Carmelina Noun note patient has a long-standing history of left bundle-branch block.  Differential diagnosis include superventricular rhythm with known interventricular conduction delay/aberrant conduction, versus ventricular tachycardia. He has no signs of congestive heart failure, chest pain, hypotension, or instability. He is mentating well. I ordered amiodarone bolus and drip with consideration for his rhythm, no low EF. I felt this was a better choice for him and beta blocker or calcium channel blocker. I discussed the case initially with cardiology on-call doctor Vota.  He was in agreement.  Bolus and drip was initiated. Patient still vacillates between 120 and 180 on his heart rate. He remains awake alert oriented lucid well oxygenated without signs of failure. Cardiology is in the patient's room planning admission.    Tanna Furry, MD 01/05/15 2354  Tanna Furry, MD 01/05/15 [phone]

## 2015-01-06 DIAGNOSIS — I509 Heart failure, unspecified: Secondary | ICD-10-CM | POA: Diagnosis present

## 2015-01-06 DIAGNOSIS — I429 Cardiomyopathy, unspecified: Secondary | ICD-10-CM

## 2015-01-06 DIAGNOSIS — I4891 Unspecified atrial fibrillation: Secondary | ICD-10-CM | POA: Insufficient documentation

## 2015-01-06 DIAGNOSIS — R0602 Shortness of breath: Secondary | ICD-10-CM | POA: Diagnosis not present

## 2015-01-06 DIAGNOSIS — R000 Tachycardia, unspecified: Secondary | ICD-10-CM

## 2015-01-06 DIAGNOSIS — I129 Hypertensive chronic kidney disease with stage 1 through stage 4 chronic kidney disease, or unspecified chronic kidney disease: Secondary | ICD-10-CM | POA: Diagnosis present

## 2015-01-06 DIAGNOSIS — Z87891 Personal history of nicotine dependence: Secondary | ICD-10-CM | POA: Diagnosis not present

## 2015-01-06 DIAGNOSIS — I447 Left bundle-branch block, unspecified: Secondary | ICD-10-CM | POA: Diagnosis present

## 2015-01-06 DIAGNOSIS — I472 Ventricular tachycardia: Secondary | ICD-10-CM

## 2015-01-06 DIAGNOSIS — Z6835 Body mass index (BMI) 35.0-35.9, adult: Secondary | ICD-10-CM | POA: Diagnosis not present

## 2015-01-06 DIAGNOSIS — I10 Essential (primary) hypertension: Secondary | ICD-10-CM | POA: Insufficient documentation

## 2015-01-06 DIAGNOSIS — E669 Obesity, unspecified: Secondary | ICD-10-CM | POA: Insufficient documentation

## 2015-01-06 DIAGNOSIS — E119 Type 2 diabetes mellitus without complications: Secondary | ICD-10-CM | POA: Diagnosis present

## 2015-01-06 DIAGNOSIS — N189 Chronic kidney disease, unspecified: Secondary | ICD-10-CM | POA: Diagnosis present

## 2015-01-06 DIAGNOSIS — I471 Supraventricular tachycardia: Secondary | ICD-10-CM | POA: Diagnosis present

## 2015-01-06 DIAGNOSIS — E780 Pure hypercholesterolemia: Secondary | ICD-10-CM | POA: Diagnosis present

## 2015-01-06 DIAGNOSIS — Z794 Long term (current) use of insulin: Secondary | ICD-10-CM | POA: Diagnosis not present

## 2015-01-06 DIAGNOSIS — Z79899 Other long term (current) drug therapy: Secondary | ICD-10-CM | POA: Diagnosis not present

## 2015-01-06 LAB — BASIC METABOLIC PANEL
Anion gap: 9 (ref 5–15)
BUN: 12 mg/dL (ref 6–23)
CALCIUM: 9.2 mg/dL (ref 8.4–10.5)
CO2: 24 mmol/L (ref 19–32)
Chloride: 106 mmol/L (ref 96–112)
Creatinine, Ser: 1.48 mg/dL — ABNORMAL HIGH (ref 0.50–1.35)
GFR calc Af Amer: 53 mL/min — ABNORMAL LOW (ref >90)
GFR calc non Af Amer: 46 mL/min — ABNORMAL LOW (ref >90)
Glucose, Bld: 212 mg/dL — ABNORMAL HIGH (ref 70–99)
Potassium: 4.5 mmol/L (ref 3.5–5.1)
Sodium: 139 mmol/L (ref 135–145)

## 2015-01-06 LAB — CBC
HCT: 40.5 % (ref 39.0–52.0)
HEMATOCRIT: 42.9 % (ref 39.0–52.0)
HEMOGLOBIN: 14.1 g/dL (ref 13.0–17.0)
Hemoglobin: 14.7 g/dL (ref 13.0–17.0)
MCH: 29.6 pg (ref 26.0–34.0)
MCH: 29.6 pg (ref 26.0–34.0)
MCHC: 34.3 g/dL (ref 30.0–36.0)
MCHC: 34.8 g/dL (ref 30.0–36.0)
MCV: 86.5 fL (ref 78.0–100.0)
MCV: 85.1 fL (ref 78.0–100.0)
PLATELETS: 137 10*3/uL — AB (ref 150–400)
Platelets: 134 10*3/uL — ABNORMAL LOW (ref 150–400)
RBC: 4.96 MIL/uL (ref 4.22–5.81)
RBC: 4.76 MIL/uL (ref 4.22–5.81)
RDW: 13.6 % (ref 11.5–15.5)
RDW: 13.6 % (ref 11.5–15.5)
WBC: 8.1 10*3/uL (ref 4.0–10.5)
WBC: 7.1 10*3/uL (ref 4.0–10.5)

## 2015-01-06 LAB — LIPID PANEL
Cholesterol: 179 mg/dL (ref 0–200)
HDL: 38 mg/dL — ABNORMAL LOW (ref >39)
LDL CALC: 108 mg/dL — AB (ref 0–99)
Total CHOL/HDL Ratio: 4.7 RATIO
Triglycerides: 166 mg/dL — ABNORMAL HIGH (ref <150)
VLDL: 33 mg/dL (ref 0–40)

## 2015-01-06 LAB — MRSA PCR SCREENING: MRSA BY PCR: NEGATIVE

## 2015-01-06 LAB — HEPARIN LEVEL (UNFRACTIONATED): Heparin Unfractionated: 0.36 IU/mL (ref 0.30–0.70)

## 2015-01-06 LAB — PROTIME-INR
INR: 1.05 (ref 0.00–1.49)
Prothrombin Time: 13.8 seconds (ref 11.6–15.2)

## 2015-01-06 LAB — GLUCOSE, CAPILLARY
GLUCOSE-CAPILLARY: 214 mg/dL — AB (ref 70–99)
GLUCOSE-CAPILLARY: 167 mg/dL — AB (ref 70–99)
Glucose-Capillary: 147 mg/dL — ABNORMAL HIGH (ref 70–99)

## 2015-01-06 LAB — MAGNESIUM: MAGNESIUM: 2.1 mg/dL (ref 1.5–2.5)

## 2015-01-06 MED ORDER — INSULIN NPH (HUMAN) (ISOPHANE) 100 UNIT/ML ~~LOC~~ SUSP
50.0000 [IU] | Freq: Every day | SUBCUTANEOUS | Status: DC
  Filled 2015-01-06: qty 10

## 2015-01-06 MED ORDER — INSULIN NPH (HUMAN) (ISOPHANE) 100 UNIT/ML ~~LOC~~ SUSP
85.0000 [IU] | Freq: Every day | SUBCUTANEOUS | Status: DC
  Administered 2015-01-07: 85 [IU] via SUBCUTANEOUS
  Filled 2015-01-06: qty 10

## 2015-01-06 MED ORDER — OFF THE BEAT BOOK
Freq: Once | Status: DC
  Filled 2015-01-06: qty 1

## 2015-01-06 MED ORDER — HEPARIN (PORCINE) IN NACL 100-0.45 UNIT/ML-% IJ SOLN
1400.0000 [IU]/h | INTRAMUSCULAR | Status: DC
  Administered 2015-01-06: 1400 [IU]/h via INTRAVENOUS
  Filled 2015-01-06: qty 250

## 2015-01-06 MED ORDER — INSULIN NPH (HUMAN) (ISOPHANE) 100 UNIT/ML ~~LOC~~ SUSP
50.0000 [IU] | Freq: Every day | SUBCUTANEOUS | Status: DC
  Administered 2015-01-06 (×2): 50 [IU] via SUBCUTANEOUS
  Filled 2015-01-06: qty 10

## 2015-01-06 MED ORDER — SIMVASTATIN 40 MG PO TABS: 40.0000 mg | ORAL_TABLET | Freq: Every day | ORAL | Status: DC

## 2015-01-06 MED ORDER — CARVEDILOL 25 MG PO TABS: 25.0000 mg | ORAL_TABLET | Freq: Two times a day (BID) | ORAL | Status: DC

## 2015-01-06 MED ORDER — ACETAMINOPHEN 325 MG PO TABS: 650.0000 mg | ORAL_TABLET | ORAL | Status: DC | PRN

## 2015-01-06 MED ORDER — INSULIN NPH (HUMAN) (ISOPHANE) 100 UNIT/ML ~~LOC~~ SUSP: 50.0000 [IU] | Freq: Two times a day (BID) | SUBCUTANEOUS | Status: DC

## 2015-01-06 MED ORDER — AMIODARONE IV BOLUS ONLY 150 MG/100ML: 150.0000 mg | Freq: Once | INTRAVENOUS | Status: DC

## 2015-01-06 MED ORDER — SIMVASTATIN 40 MG PO TABS
40.0000 mg | ORAL_TABLET | Freq: Every day | ORAL | Status: DC
  Administered 2015-01-06 (×2): 40 mg via ORAL
  Filled 2015-01-06 (×2): qty 1

## 2015-01-06 MED ORDER — ONDANSETRON HCL 4 MG/2ML IJ SOLN: 4.0000 mg | Freq: Four times a day (QID) | INTRAMUSCULAR | Status: DC | PRN

## 2015-01-06 MED ORDER — CARVEDILOL 25 MG PO TABS
25.0000 mg | ORAL_TABLET | Freq: Two times a day (BID) | ORAL | Status: DC
  Administered 2015-01-06 – 2015-01-07 (×3): 25 mg via ORAL
  Filled 2015-01-06 (×3): qty 1

## 2015-01-06 MED ORDER — AMIODARONE LOAD VIA INFUSION
150.0000 mg | Freq: Once | INTRAVENOUS | Status: AC
  Administered 2015-01-06: 150 mg via INTRAVENOUS
  Filled 2015-01-06: qty 83.34

## 2015-01-06 MED ORDER — HEPARIN BOLUS VIA INFUSION
4000.0000 [IU] | Freq: Once | INTRAVENOUS | Status: AC
  Administered 2015-01-06: 4000 [IU] via INTRAVENOUS
  Filled 2015-01-06: qty 4000

## 2015-01-06 MED ORDER — RIVAROXABAN 20 MG PO TABS
20.0000 mg | ORAL_TABLET | Freq: Every day | ORAL | Status: DC
  Administered 2015-01-06: 20 mg via ORAL
  Filled 2015-01-06: qty 1

## 2015-01-06 NOTE — ED Notes (Addendum)
I validated vitals at 0045 a blood pressure at 86/71 that was incorrect.  His blood pressure cuff was not on his arm correctly.  Disregard these vitals.

## 2015-01-06 NOTE — Progress Notes (Signed)
ANTICOAGULATION CONSULT NOTE - Initial Consult  Pharmacy Consult for Xarelto Indication: atrial fibrillation  Allergies  Allergen Reactions  . Aspirin Other (See Comments)    Burps, burning, stomach pains, etc  . Penicillins Other (See Comments)    syncope    Patient Measurements: Height: 5\' 11"  (180.3 cm) Weight: 258 lb 9.6 oz (117.3 kg) IBW/kg (Calculated) : 75.3 Heparin Dosing Weight: 105kg  Vital Signs: Temp: 98.3 F (36.8 C) (02/13 0805) Temp Source: Oral (02/13 0805) BP: 119/62 mmHg (02/13 0805) Pulse Rate: 72 (02/13 0805)  Labs:  Recent Labs  01/05/15 2055 01/06/15 0400 01/06/15 0920  HGB 15.0 14.7 14.1  HCT 43.3 42.9 40.5  PLT 155 134* 137*  LABPROT  --  13.8  --   INR  --  1.05  --   HEPARINUNFRC  --   --  0.36  CREATININE 1.52* 1.48*  --     Estimated Creatinine Clearance: 59.6 mL/min (by C-G formula based on Cr of 1.48).   Medical History: Past Medical History  Diagnosis Date  . Cardiomyopathy   . Personal history of kidney stones   . Cardiomyopathy   . CHF (congestive heart failure)   . Diabetes mellitus   . Hypercholesterolemia   . Hypertension   . Diverticulosis      Assessment: 74yo male admitted on 01/05/2015 with c/o sudden onset of SOB, dizziness, and weakness, was tachycardiac in 140s at [facility] and tx'd to [facility], found to be in Afib with CHADSVASc of 4 to begin heparin gtt. Initial HL was therapeutic at 0.36 and he is now to be transitioned to Xarelto. SCr slightly above most recent outpatient level at 1.48 (CrCl ~75 ml/min). CBC wnl and stable with no reported s/s bleeding.  Goal of Therapy:  Heparin level 0.3-0.7 units/ml Monitor platelets by anticoagulation protocol: Yes   Plan:  - D/c heparin gtt at the time of first Xarelto dose - Initiate Xarelto at 20 mg PO daily with supper - Monitor renal function, CBC and for s/s bleeding  Blade Scheff K. Velva Harman, PharmD Clinical Pharmacist - Resident Pager: [phone] Pharmacy:  [phone] 01/06/2015 11:43 AM

## 2015-01-06 NOTE — ED Notes (Signed)
Family at bedside. 

## 2015-01-06 NOTE — H&P (Cosign Needed)
Patient ID: Justin Hill MRN: [id number], DOB/AGE: 72-May-1942   Admit date: 01/05/2015   Primary Physician: Cyndy Freeze, MD Primary Cardiologist: Aundra Dubin  CC: WCT  Problem List  Past Medical History  Diagnosis Date  . Cardiomyopathy   . Personal history of kidney stones   . Cardiomyopathy   . CHF (congestive heart failure)   . Diabetes mellitus   . Hypercholesterolemia   . Hypertension   . Diverticulosis     Past Surgical History  Procedure Laterality Date  . Shoulder surgery  1981    left. Muscle reattachment  . Tonsillectomy    . Toe surgery Right 12/2012    2nd toe  . Colonoscopy N/A 12/29/2013    Procedure: COLONOSCOPY;  Surgeon: Lafayette Dragon, MD;  Location: [facility];  Service: Endoscopy;  Laterality: N/A;     Allergies  Allergies  Allergen Reactions  . Aspirin Other (See Comments)    Burps, burning, stomach pains, etc  . Penicillins Other (See Comments)    syncope    HPI The patient is a 74M with a history of HTN, DM2, NICMP EF 20-25% on TTE, CKD, who presents with shortness of breath and irregular WCT. He went out to dinner with his wife earlier and afterwards was walking at [facility] where he described sudden onset shortness of breath and lightheadedness. Given persistent symptoms he initially presented to the urgent care and was then transferred to the ED for additional management of tachycardia.  He has never felt like this before. Previously was known to have good functional status and was able to walk at [facility] and [place] with no significant limitation, though he stated that he frequently took breaks as necessary. He has a chronic LBBB and TTE showed dyssynchrony. He was advised to consider CRT-D but declined; additionally, he was felt to be NYHA I but never wanted to take a CPX to quantify this.  In the ED, VS 147 108/83. EKG revealed irregular WCT. He was started on amiodarone. He has no prior history of AF.   Home  Medications  Prior to Admission medications   Medication Sig Start Date End Date Taking? Authorizing Provider  Ascorbic Acid (VITAMIN C) 500 MG CAPS Take 1 capsule by mouth 2 (two) times daily.    Yes Historical Provider, MD  Bilberry, Vaccinium myrtillus, (BILBERRY EXTRACT PO) Take 1,000 mg by mouth daily. 1.000mg  once a day   Yes Historical Provider, MD  carvedilol (COREG) 25 MG tablet Take 1 tablet (25 mg total) by mouth 2 (two) times daily with a meal.   Yes Larey Dresser, MD  Chlorpheniramine-APAP 2-325 MG TABS Take 1 tablet by mouth daily as needed (for runny nose).   Yes Historical Provider, MD  CINNAMON PO Take 1 tablet by mouth every evening.    Yes Historical Provider, MD  diphenhydramine-acetaminophen (TYLENOL PM) 25-500 MG TABS Take 2 tablets by mouth at bedtime.   Yes Historical Provider, MD  insulin NPH Human (HUMULIN N) 100 UNIT/ML injection 85 units each morning, and 50 units each evening Patient taking differently: Inject 50-85 Units into the skin 2 (two) times daily before a meal. 85 units each morning, and 50 units each evening 12/21/14  Yes Renato Shin, MD  Multiple Vitamin (MULTIVITAMIN) tablet Take 1 tablet by mouth daily.     Yes Historical Provider, MD  simvastatin (ZOCOR) 40 MG tablet Take 40 mg by mouth at bedtime.     Yes Historical Provider, MD  TRILIPIX 135 MG capsule Take  1 tablet by mouth daily. 03/14/12  Yes Historical Provider, MD    Family History  Family History  Problem Relation Age of Onset  . Heart failure Maternal Grandmother   . Diabetes type II Sister 89  . Hyperlipidemia Other     Parent  . Stroke Other     Parent  . Hypertension Other     Parent  . Diabetes Other     Parent  . Colon cancer Neg Hx     Social History  History   Social History  . Marital Status: Married    Spouse Name: Rosemarie Beath  . Number of Children: 1  . Years of Education: 12   Occupational History  . retired    Social History Main Topics  . Smoking status:  Former Smoker    Types: Cigarettes  . Smokeless tobacco: Never Used  . Alcohol Use: No  . Drug Use: No  . Sexual Activity: Not on file   Other Topics Concern  . Not on file   Social History Narrative   Regular exercise-no           Review of Systems General:  No chills, fever, night sweats or weight changes.  Cardiovascular:  No chest pain, +dyspnea on exertion, -edema, -orthopnea, -palpitations,-paroxysmal nocturnal dyspnea. +LH Dermatological: No rash, lesions/masses Respiratory: No cough, dyspnea Urologic: No hematuria, dysuria Abdominal:   No nausea, vomiting, diarrhea, bright red blood per rectum, melena, or hematemesis Neurologic:  No visual changes, wkns, changes in mental status. All other systems reviewed and are otherwise negative except as noted above.  Physical Exam  Blood pressure 133/114, pulse 138, temperature 97.6 F (36.4 C), temperature source Oral, resp. rate 22, height 5\' 11"  (1.803 m), weight 259 lb 0.7 oz (117.5 kg), SpO2 95 %.  General: Pleasant, NAD Psych: Normal affect. Neuro: Alert and oriented X 3. Moves all extremities spontaneously. HEENT: Normal  Neck: Supple without bruits. No JVD. Lungs:  Resp regular and unlabored, CTA. Heart: irregular, tachycardic. no s3, s4, or murmurs. Abdomen: Soft, non-tender, non-distended, BS + x 4.  Extremities: No clubbing, cyanosis or edema. DP/PT/Radials 2+ and equal bilaterally.  Labs  Troponin [facility] of Care Test)  Recent Labs  01/05/15 2103  TROPIPOC 0.05   No results for input(s): CKTOTAL, CKMB, TROPONINI in the last 72 hours. Lab Results  Component Value Date   WBC 7.1 01/05/2015   HGB 15.0 01/05/2015   HCT 43.3 01/05/2015   MCV 84.2 01/05/2015   PLT 155 01/05/2015    Recent Labs Lab 01/05/15 2055  NA 136  K 4.5  CL 101  CO2 26  BUN 15  CREATININE 1.52*  CALCIUM 9.3  GLUCOSE 252*    Radiology/Studies  Dg Chest Port 1 View  01/05/2015   CLINICAL DATA:  Dizziness. Shortness of  breath. Tachycardia. Cardiomyopathy.  EXAM: PORTABLE CHEST - 1 VIEW  COMPARISON:  02/23/2009  FINDINGS: Low lung volumes are noted as well as lordotic positioning. Both lungs are grossly clear. Heart size is within normal limits.  IMPRESSION: No acute findings.   Electronically Signed   By: Earle Gell M.D.   On: 01/05/2015 21:35    ECG 2/12 20.42 - WCT at 143 bpm, irregular. Left bundle morphology though down in I. No concodance. Likely AF-RVR but difficult to tell  2/12 22:36: WCT at 164 bpm, still irregular. Left bundle morphology  TTE 05/2014 Study Conclusions  - Left ventricle: The cavity size was mildly dilated. There was mild concentric hypertrophy.  Systolic function was severely reduced. The estimated ejection fraction was in the range of 25% to 30%. Diffuse hypokinesis. - Ventricular septum: Septal motion showed LBBB related dyssynergy, and paradoxical motion. - Aortic valve: There was trivial regurgitation   ASSESSMENT AND PLAN 1. WCT - SVT with aberrancy versus VT 2. Nonischemic cardiomyopathy 3. HTN 4. DM  The patient is a 52M with a history of HTN, DM2, NICMP EF 20-25% on TTE, CKD, who presents with shortness of breath and irregular WCT. His rhythm is particularly interesting. His EKG shows a LBBB morphology similar to his baseline and is irregular. I don't see clear p-waves. There is no concordance in the precordium and AVR is down, all suggesting aberrant SVT. However, given his underlying cardiomyopathy VT cannot be excluded and is statistically likely due to the presence of structural heart disease. He is currently stable and asymptomatic. If he becomes unstable would cardiovert immediately. UI am reluctant to cardiovert otherwise because if it is AF he is not anticoagulated and thus there would be a nonsignificant risk of stroke. Until that time, we will manage him with amiodarone bolus and infusion for now.  -amiodarone bolus 150mg  x 1, then 1mg /min x 6 hours,  then 0.5mg /min x 18 hours -cardiovert if unstable or sympomatic -K>4, Mg>2 -heparin anticoagulation given possibility of AF - he is CHADS-VASC = 4 (Age, CHF, DM, HTN) -continue beta-blocker -continue monitoring on telemetry -serially trend cardiac markers  #NICMP -continue BB -would strongly consider CRT-D now. If he has sustained VT then indication would be for secondary prevention -no diuresis at this time  #HTN - continue current regimen  #DM - continue home diabetes regimen of Novolin bid -SSI added  FULL CODE  Signed, Raliegh Ip, MD 01/06/2015, 1:14 AM

## 2015-01-06 NOTE — Progress Notes (Signed)
ANTICOAGULATION CONSULT NOTE - Initial Consult  Pharmacy Consult for heparin Indication: atrial fibrillation  Allergies  Allergen Reactions  . Aspirin Other (See Comments)    Burps, burning, stomach pains, etc  . Penicillins Other (See Comments)    syncope    Patient Measurements: Height: 5\' 11"  (180.3 cm) Weight: 259 lb 0.7 oz (117.5 kg) IBW/kg (Calculated) : 75.3 Heparin Dosing Weight: 105kg  Vital Signs: Temp: 97.6 F (36.4 C) (02/12 2103) Temp Source: Oral (02/12 2103) BP: 133/114 mmHg (02/13 0015) Pulse Rate: 138 (02/13 0015)  Labs:  Recent Labs  01/05/15 2055  HGB 15.0  HCT 43.3  PLT 155  CREATININE 1.52*    Estimated Creatinine Clearance: 58.1 mL/min (by C-G formula based on Cr of 1.52).   Medical History: Past Medical History  Diagnosis Date  . Cardiomyopathy   . Personal history of kidney stones   . Cardiomyopathy   . CHF (congestive heart failure)   . Diabetes mellitus   . Hypercholesterolemia   . Hypertension   . Diverticulosis      Assessment: 74yo male c/o sudden onset of SOB, dizziness, and weakness, was tachycardiac in 140s at [facility] and tx'd to [facility], found to be in Afib, to begin heparin.  Goal of Therapy:  Heparin level 0.3-0.7 units/ml Monitor platelets by anticoagulation protocol: Yes   Plan:  Will given heparin 4000 units IV bolus x1 followed by gtt at 1400 units/hr and monitor heparin levels and CBC; f/u long-term anticoag plan if appropriate.  Wynona Neat, PharmD, BCPS  01/06/2015,12:34 AM

## 2015-01-06 NOTE — Consult Note (Addendum)
ELECTROPHYSIOLOGY CONSULT NOTE    Primary Care Physician: Cyndy Freeze, MD Referring Physician:  Dr Johnsie Cancel  Admit Date: 01/05/2015  Reason for consultation:  Wide complex  Tachycardia  Justin Hill is a 74 y.o. male with a h/o nonischemic CM, chronic systolic dysfunction, LBBB, diabetes, HTN, and obesity who is admitted with a wide complex tachycardia.  He is followed closely by Dr Aundra Dubin (I have reviewed his notes).  He has also seen Dr Lovena Le previously  The patient is felt to have chronically NYHA Class I symptoms.  He was planned to have a CPX test but has declined this previously. He reports that he is very active.  He does his own yard work.  He does however have to sit down and rest when he gets home because he is "tired".     Yesterday, while walking around at [place], he developed acute dyspnea and fatigue.  He had to sit down and rest because of fatigue.  He was brought to [facility] where he was found to have a relatively regular wide complex tachycardia with QRS similar to his chronic LBBB.  He was admitted and started on heparin gtt and amiodarone.  He did well overnight and spontaneously converted to sinus rhythm.  His symptoms have completely resolved and he is now back at baseline.  He did not have palpitations with the episode and does not think that he has had afib or tachycardias in the past.  He did not have syncope with the episode.  His wife does not think that he snores.    Today, he denies symptoms of palpitations, chest pain, shortness of breath, orthopnea, PND, lower extremity edema, dizziness, presyncope, syncope, or neurologic sequela. The patient is tolerating medications without difficulties and is otherwise without complaint today.   Past Medical History  Diagnosis Date  . Cardiomyopathy   . Personal history of kidney stones   . Cardiomyopathy   . CHF (congestive heart failure)   . Diabetes mellitus   . Hypercholesterolemia   . Hypertension     . Diverticulosis    Past Surgical History  Procedure Laterality Date  . Shoulder surgery  1981    left. Muscle reattachment  . Tonsillectomy    . Toe surgery Right 12/2012    2nd toe  . Colonoscopy N/A 12/29/2013    Procedure: COLONOSCOPY;  Surgeon: Lafayette Dragon, MD;  Location: [facility];  Service: Endoscopy;  Laterality: N/A;    . carvedilol  25 mg Oral BID WC  . insulin NPH Human  50 Units Subcutaneous QHS  . insulin NPH Human  85 Units Subcutaneous QAC breakfast  . off the beat book   Does not apply Once  . rivaroxaban  20 mg Oral Q supper  . simvastatin  40 mg Oral QHS      Allergies  Allergen Reactions  . Aspirin Other (See Comments)    Burps, burning, stomach pains, etc  . Penicillins Other (See Comments)    syncope    History   Social History  . Marital Status: Married    Spouse Name: Rosemarie Beath  . Number of Children: 1  . Years of Education: 12   Occupational History  . retired    Social History Main Topics  . Smoking status: Former Smoker    Types: Cigarettes  . Smokeless tobacco: Never Used  . Alcohol Use: No  . Drug Use: No  . Sexual Activity: Not on file   Other Topics Concern  .  Not on file   Social History Narrative   Regular exercise-no          Family History  Problem Relation Age of Onset  . Heart failure Maternal Grandmother   . Diabetes type II Sister 35  . Hyperlipidemia Other     Parent  . Stroke Other     Parent  . Hypertension Other     Parent  . Diabetes Other     Parent  . Colon cancer Neg Hx     ROS- All systems are reviewed and negative except as per the HPI above  Physical Exam: Telemetry is reviewed and reveals an initial regular wide complex tachycardia at 140-150 bpm which then becomes irregular and is clearly at that point afib without any change in the qrs morphology.  There are times that his regular tachycardia slows and is suggestive of atrial flutter.    Filed Vitals:   01/06/15 0100 01/06/15 0150  01/06/15 0400 01/06/15 0805  BP: 128/97 129/92 83/57 119/62  Pulse: 80 81 73 72  Temp:  97.5 F (36.4 C) 98.3 F (36.8 C) 98.3 F (36.8 C)  TempSrc:  Oral Oral Oral  Resp: 17 19 16 19   Height:  5\' 11"  (1.803 m)    Weight:  258 lb 9.6 oz (117.3 kg)    SpO2: 95% 96% 95% 98%    GEN- The patient is overweight but well appearing,very pleasant, alert and oriented x 3 today.   Head- normocephalic, atraumatic Eyes-  Sclera clear, conjunctiva pink Ears- hearing intact Oropharynx- clear Neck- supple  Lungs- Clear to ausculation bilaterally, normal work of breathing Heart- Regular rate and rhythm, no murmurs, rubs or gallops, PMI not laterally displaced GI- soft, NT, ND, + BS Extremities- no clubbing, cyanosis, or edema MS- no significant deformity or atrophy Skin- no rash or lesion Psych- euthymic mood, full affect Neuro- strength and sensation are intact  EKGs are reviewed and reveal wide complex LBBB tachycardia  Labs:   Lab Results  Component Value Date   WBC 7.1 01/06/2015   HGB 14.1 01/06/2015   HCT 40.5 01/06/2015   MCV 85.1 01/06/2015   PLT 137* 01/06/2015    Recent Labs Lab 01/06/15 0400  NA 139  K 4.5  CL 106  CO2 24  BUN 12  CREATININE 1.48*  CALCIUM 9.2  GLUCOSE 212*   No results found for: CKTOTAL, CKMB, CKMBINDEX, TROPONINI Lab Results  Component Value Date   CHOL 179 01/06/2015   Lab Results  Component Value Date   HDL 38* 01/06/2015   Lab Results  Component Value Date   LDLCALC 108* 01/06/2015   Lab Results  Component Value Date   TRIG 166* 01/06/2015   Lab Results  Component Value Date   CHOLHDL 4.7 01/06/2015   No results found for: LDLDIRECT    Echo 7/15 echo is reviewed and reveals EF 25%, LA 38 mm  Dr Oleh Genin office note, admit notes are reviewed  ASSESSMENT AND PLAN:   1. Wide complex tachycardia/ atrial fibrillation The patient presents with abrupt onset of symptomatic wide complex tachycardia.  I have reviewed his ekgs and  telemetry at length.  His QRS during tachycardia is identical to sinus.  I therefore feel that he almost certainly has SVT (most likely atrial flutter) which degenerated into atrial fibrillation.  I cannot exclude bundle branch reentry though I think that this is less likely as at times his regular tachycardia slows and seems to reveal underlying atrial flutter waves.  I have had a long discussion with the patient and his spouse today regarding his arrhythmia.  I offered EPS/ Ablation of the regular tachycardia.  He is clear that he is not interested at this time.   I have been very clear that as he has had afib documented that he should be anticoagulated long term.  His chads2vasc score is at least 4.  I will therefore stop IV heparin and start xarelto.  Given his concerns that he is a "free bleeder" and data showing slightly increased bleeding in the diabetic subgroup on eliquis, I would favor xarelto specifically for this patient.  We also discussed antiarrhythmic drug options.  I offered genetic AF (bucindolol in patients with certain adrenergic receptor phenotype), tikosyn, and amiodarone as options.  At this time, he would be most interested in genetic AF trial.  I will therefore refer him to our research team to discuss this further. I will stop IV amiodarone and observe today.  If he has no further arrhythmias, then he can be discharged tomorrow AM to have Genetic AF screening next week.  He will need to be seen by Roderic Palau NP in the AF clinic next week.  If he does not qualify for genetic AF then he will contemplate either tikosyn or amiodarone at that time.  2. Nonischemic CM/ LBBB By my history today, I feel that he has NYHA Class II symptoms.  I would therefore advise BiV ICD.  I have offered implantation on Monday to the patient.  He is clear at this time that he does not wish to have a BiV ICD implant.  He wants to continue to follow with Dr Aundra Dubin and is considering CPX testing which was  previously advised. Continue coreg.  He has not been treated with ace inhibitor therapy by Dr Aundra Dubin due to chronic renal issues.  3. HTN Stable No change required today  4. Obesity Weight loss advised He declines sleep study   This is a very complicated patient with multiple medical issues.  He is at risk for decompensation, worsening CHF, and further arrhythmias.  A high level of decision making was required for his consult today.   Observe through tomorrow am.  If he remains stable at that time, he could be discharged and seen in the AF clinic next week.  If he has further arrhythmias then we will need to reconsider.  Electrophysiology team to see as needed while here. Please call with questions.   [name], MD 01/06/2015  11:18 AM

## 2015-01-06 NOTE — ED Notes (Signed)
MD at bedside. 

## 2015-01-07 LAB — CBC
HCT: 41.9 % (ref 39.0–52.0)
Hemoglobin: 14 g/dL (ref 13.0–17.0)
MCH: 29.2 pg (ref 26.0–34.0)
MCHC: 33.4 g/dL (ref 30.0–36.0)
MCV: 87.3 fL (ref 78.0–100.0)
PLATELETS: 143 10*3/uL — AB (ref 150–400)
RBC: 4.8 MIL/uL (ref 4.22–5.81)
RDW: 13.5 % (ref 11.5–15.5)
WBC: 5.8 10*3/uL (ref 4.0–10.5)

## 2015-01-07 LAB — GLUCOSE, CAPILLARY: GLUCOSE-CAPILLARY: 147 mg/dL — AB (ref 70–99)

## 2015-01-07 MED ORDER — RIVAROXABAN 20 MG PO TABS: 20.0000 mg | ORAL_TABLET | Freq: Every day | ORAL | Status: DC

## 2015-01-07 NOTE — Discharge Summary (Signed)
Physician Discharge Summary  Patient ID: Justin Hill MRN: [id number] DOB/AGE: January 03, 1941 74 y.o.   Primary Cardiologist: Dr. Aundra Hill Electrophysiologist: Dr. Lovena Hill  Patient Profile: 74 y/o M with a history of HTN, DM2, NICMP EF 20-25% on TTE, chronic LBBB and CKD, who presented to [facility] with shortness of breath and irregular WCT.  Admit date: 01/05/2015 Discharge date: 01/07/2015  Admission Diagnoses: Wide Complex Tachycardia  Discharge Diagnoses:  Active Problems:   Wide-complex tachycardia   Atrial fibrillation with rapid ventricular response   Left bundle branch block   Morbid obesity   Essential hypertension   Discharged Condition: stable  Hill Course: The patient is a 74 y/o M with a history of HTN, DM2, NICMP EF 20-25% on TTE, chronic LBBB and CKD, who presented to [facility] on 01/05/15 with shortness of breath and irregular WCT. Per records, he has been seen by Dr. Lovena Hill in the past and felt to have chronically NYHA Class I symptoms. He was planned to have a CPX test but declined this previously.  Per patient report, he went out to dinner with his wife earlier and afterwards was walking at [facility] where he described sudden onset shortness of breath and lightheadedness. Given persistent symptoms he initially presented to the urgent care and was then transferred to the ED for additional management of tachycardia. On arrival, he was found to have a relatively regular wide complex tachycardia with QRS similar to his chronic LBBB. He was admitted and started on heparin gtt and amiodarone. He did well overnight and spontaneously converted to sinus rhythm. With conversion back to SR, his symptoms resolved.   Electrophysiology was consulted for recommendations. He was seen and examined by Dr. Rayann Hill. He reviewed his ekgs and telemetry at length. It appeared his QRS during tachycardia was identical to sinus.Therefore, it was felt that he almost certainly had SVT (most likely atrial  flutter) which degenerated into atrial fibrillation. A long discussion was held with the patient and his spouse regarding his arrhythmia. Dr. Rayann Hill offered EPS/ Ablation of the regular tachycardia, however the patient declined. Given his documented afib and high risk for stroke w/ CHA2SD2 VASc score of at least 4, oral anticoagulation was recommended. The patient agreed to this and was started on Xarelto, 20 mg daily. AAD options were also discussed as well as genetic AF testing. The patient expressed interest in enrolling in the genetic AF trial. Dr. Rayann Hill also made the decision to discontinue IV amiodarone and to observe the patient overnight on telemetry to assess for recurrent arrhthymias. No further arrhthymias were captured. He was last seen and examined by Justin Hill who determined he was stable for discharge. The patient will f/u in AF clinic with Justin Palau, NP, within 1 week to enroll in the genetic AF trial. Prescription was ordered for Xarelto, 20 mg. He was also given free 30 day Xarelto card. All other home meds were continued including Coreg. He will also follow-up with Dr. Aundra Hill in 3-4 weeks.    Consults: Electrophysiology  Significant Diagnostic Studies:   Treatments: See Hill Course  Discharge Exam: Blood pressure 143/84, pulse 86, temperature 98.2 F (36.8 C), temperature source Oral, resp. rate 20, height 5\' 11"  (1.803 m), weight 255 lb (115.667 kg), SpO2 97 %.   Disposition: 01-Home or Self Care      Discharge Instructions    Diet - low sodium heart healthy    Complete by:  As directed      Increase activity slowly    Complete  by:  As directed             Medication List    TAKE these medications        BILBERRY EXTRACT PO  Take 1,000 mg by mouth daily. 1.000mg  once a day     carvedilol 25 MG tablet  Commonly known as:  COREG  Take 1 tablet (25 mg total) by mouth 2 (two) times daily with a meal.     Chlorpheniramine-APAP 2-325 MG Tabs  Take 1  tablet by mouth daily as needed (for runny nose).     CINNAMON PO  Take 1 tablet by mouth every evening.     diphenhydramine-acetaminophen 25-500 MG Tabs  Commonly known as:  TYLENOL PM  Take 2 tablets by mouth at bedtime.     insulin NPH Human 100 UNIT/ML injection  Commonly known as:  HUMULIN N  85 units each morning, and 50 units each evening     multivitamin tablet  Take 1 tablet by mouth daily.     rivaroxaban 20 MG Tabs tablet  Commonly known as:  XARELTO  Take 1 tablet (20 mg total) by mouth daily with supper.     rivaroxaban 20 MG Tabs tablet  Commonly known as:  XARELTO  Take 1 tablet (20 mg total) by mouth daily with supper.     simvastatin 40 MG tablet  Commonly known as:  ZOCOR  Take 40 mg by mouth at bedtime.     TRILIPIX 135 MG capsule  Generic drug:  Choline Fenofibrate  Take 1 tablet by mouth daily.     Vitamin C 500 MG Caps  Take 1 capsule by mouth 2 (two) times daily.       Follow-up Information    Follow up with Hill,DONNA, NP.   Specialty:  Nurse Practitioner   Why:   with Dr. Jackalyn Hill and Dr. Tanna Furry PA - our office will call you with appointment    Contact information:   [place] [address] [phone]       Follow up with Justin Hill.   Specialty:  Cardiology   Why:  our office will call you with an appointment   Contact information:   [address] [place] [address] [phone]       TIME SPENT ON DISCHARGE, INCLUDING PHYSICIAN TIME: >30 MINUTES   Signed: Lyda Hill 01/07/2015, 8:16 AM

## 2015-01-07 NOTE — Progress Notes (Signed)
Patient ID: Justin Hill, male   DOB: [birth year], 74 y.o.   MRN: [id number]    Subjective:  Denies SSCP, palpitations or Dyspnea Telemetry NSR  Objective:  Filed Vitals:   01/06/15 1620 01/06/15 2000 01/07/15 0000 01/07/15 0430  BP: 135/58 136/60 124/57 143/84  Pulse: 86 79 90 86  Temp: 99 F (37.2 C) 98.7 F (37.1 C) 98.4 F (36.9 C) 98.2 F (36.8 C)  TempSrc: Oral Oral Oral Oral  Resp: 20 18 20 20   Height:      Weight:    115.667 kg (255 lb)  SpO2: 97% 95% 95% 97%    Intake/Output from previous day:  Intake/Output Summary (Last 24 hours) at 01/07/15 0740 Last data filed at 01/06/15 1700  Gross per 24 hour  Intake    600 ml  Output    450 ml  Net    150 ml    Physical Exam: Affect appropriate Obese white male  HEENT: normal Neck supple with no adenopathy JVP normal no bruits no thyromegaly Lungs clear with no wheezing and good diaphragmatic motion Heart:  S1/S2 no murmur, no rub, gallop or click PMI normal Abdomen: benighn, BS positve, no tenderness, no AAA no bruit.  No HSM or HJR Distal pulses intact with no bruits No edema Neuro non-focal Skin warm and dry No muscular weakness   Lab Results: Basic Metabolic Panel:  Recent Labs  01/05/15 2055 01/06/15 0400  NA 136 139  K 4.5 4.5  CL 101 106  CO2 26 24  GLUCOSE 252* 212*  BUN 15 12  CREATININE 1.52* 1.48*  CALCIUM 9.3 9.2  MG 2.1  --    CBC:  Recent Labs  01/06/15 0920 01/07/15 0322  WBC 7.1 5.8  HGB 14.1 14.0  HCT 40.5 41.9  MCV 85.1 87.3  PLT 137* 143*   Fasting Lipid Panel:  Recent Labs  01/06/15 0400  CHOL 179  HDL 38*  LDLCALC 108*  TRIG 166*  CHOLHDL 4.7   Thyroid Function Tests:  Recent Labs  01/05/15 2115  TSH 3.590    Imaging: Dg Chest Port 1 View  01/05/2015   CLINICAL DATA:  Dizziness. Shortness of breath. Tachycardia. Cardiomyopathy.  EXAM: PORTABLE CHEST - 1 VIEW  COMPARISON:  02/23/2009  FINDINGS: Low lung volumes are noted as well as lordotic  positioning. Both lungs are grossly clear. Heart size is within normal limits.  IMPRESSION: No acute findings.   Electronically Signed   By: Justin Hill M.D.   On: 01/05/2015 21:35    Cardiac Studies:  ECG:    Telemetry:  NSR no flutter or VT   Echo:  EF 25-30%  7/15    Medications:   . carvedilol  25 mg Oral BID WC  . insulin NPH Human  50 Units Subcutaneous QHS  . insulin NPH Human  85 Units Subcutaneous QAC breakfast  . off the beat book   Does not apply Once  . rivaroxaban  20 mg Oral Q supper  . simvastatin  40 mg Oral QHS       Assessment/Plan:  Arrhythmia:  See consult note by Justin Hill  F/u Justin Hill next week to be enrolled in research trial Xarelto is new DCM:  Declines AICD  Has seen Justin Hill before  ? Why not on ACE Cr 1.48  F/u Justin Hill who they identify as their doctor Chol:  On statin   D/C home today   Justin Hill 01/07/2015, 7:40 AM

## 2015-01-07 NOTE — Discharge Instructions (Signed)
Information on my medicine - XARELTO (Rivaroxaban)  This medication education was reviewed with me or my healthcare representative as part of my discharge preparation.  The pharmacist that spoke with me during my hospital stay was:  Adora Fridge, [facility] ([facility]  Why was Xarelto prescribed for you? Xarelto was prescribed for you to reduce the risk of a blood clot forming that can cause a stroke if you have a medical condition called atrial fibrillation (a type of irregular heartbeat).  What do you need to know about xarelto ? Take your Xarelto ONCE DAILY at the same time every day with your evening meal. If you have difficulty swallowing the tablet whole, you may crush it and mix in applesauce just prior to taking your dose.  Take Xarelto exactly as prescribed by your doctor and DO NOT stop taking Xarelto without talking to the doctor who prescribed the medication.  Stopping without other stroke prevention medication to take the place of Xarelto may increase your risk of developing a clot that causes a stroke.  Refill your prescription before you run out.  After discharge, you should have regular check-up appointments with your healthcare provider that is prescribing your Xarelto.  In the future your dose may need to be changed if your kidney function or weight changes by a significant amount.  What do you do if you miss a dose? If you are taking Xarelto ONCE DAILY and you miss a dose, take it as soon as you remember on the same day then continue your regularly scheduled once daily regimen the next day. Do not take two doses of Xarelto at the same time or on the same day.   Important Safety Information A possible side effect of Xarelto is bleeding. You should call your healthcare provider right away if you experience any of the following: ? Bleeding from an injury or your nose that does not stop. ? Unusual colored urine (red or dark brown) or unusual colored stools (red or  black). ? Unusual bruising for unknown reasons. ? A serious fall or if you hit your head (even if there is no bleeding).  Some medicines may interact with Xarelto and might increase your risk of bleeding while on Xarelto. To help avoid this, consult your healthcare provider or pharmacist prior to using any new prescription or non-prescription medications, including herbals, vitamins, non-steroidal anti-inflammatory drugs (NSAIDs) and supplements.  This website has more information on Xarelto: [email or web address]

## 2015-01-07 NOTE — Care Management Note (Signed)
    Page 1 of 1   01/07/2015     12:57:08 PM CARE MANAGEMENT NOTE 01/07/2015  Patient:  Justin Hill   Account Number:  [id number]  Date Initiated:  01/07/2015  Documentation initiated by:  [facility]  Subjective/Objective Assessment:   adm:     Action/Plan:   discharge planning   Anticipated DC Date:  01/07/2015   Anticipated DC Plan:  [place]  CM consult  Medication Assistance      Choice offered to / List presented to:             Status of service:  Completed, signed off Medicare Important Message given?   (If response is "NO", the following Medicare IM given date fields will be blank) Date Medicare IM given:   Medicare IM given by:   Date Additional Medicare IM given:   Additional Medicare IM given by:    Discharge Disposition:  HOME/SELF CARE  Per UR Regulation:    If discussed at Long Length of Stay Meetings, dates discussed:    Comments:  01/07/15 09:39 CM met with pt in room and gave him a 3o day free trial card for Xarelto.  Pt verbalizes understanding he will have the office staf at his follow up visit ensure the medicaiton has been authorized by his insurance to cover his refills.  No other Cm needs were communicated. Mariane Masters, BSN, CM [phone].

## 2015-01-10 ENCOUNTER — Encounter: Payer: Self-pay | Admitting: Nurse Practitioner

## 2015-01-10 ENCOUNTER — Ambulatory Visit (INDEPENDENT_AMBULATORY_CARE_PROVIDER_SITE_OTHER): Payer: Medicare Other | Admitting: Nurse Practitioner

## 2015-01-10 ENCOUNTER — Telehealth: Payer: Self-pay | Admitting: *Deleted

## 2015-01-10 VITALS — BP 130/72 | HR 84 | Ht 71.0 in | Wt 258.8 lb

## 2015-01-10 DIAGNOSIS — I4891 Unspecified atrial fibrillation: Secondary | ICD-10-CM | POA: Diagnosis not present

## 2015-01-10 NOTE — Telephone Encounter (Signed)
Upon leaving his appointment while in check out patient has hypoglycemic event = pt was diaphorectic but otherwise no other symptoms. Patient was given snack and drink. Felt better prior to leaving

## 2015-01-10 NOTE — Patient Instructions (Signed)
Your physician recommends that you schedule a follow-up appointment in: 3 weeks with Dr. Joanie Coddington the Research Nurse will be in contact with you

## 2015-01-10 NOTE — Progress Notes (Signed)
Pt scheduled for Genetic AF screening visit 01/15/15 @ 1pm

## 2015-01-10 NOTE — Progress Notes (Signed)
Primary Care Physician: Cyndy Freeze, MD Referring Physician:   Remmington Heckel is a 74 y.o. male  nonischemic CM, chronic systolic dysfunction,  EF 25-30%, LBBB, diabetes, HTN, and obesity who is admitted with a wide complex tachycardia. He is followed closely by Dr Aundra Dubin. He has also seen Dr Lovena Le previously many years ago. The patient is felt to have chronically NYHA Class I symptoms. He has been encouraged to have a  CPX test by Dr. Aundra Dubin  but has declined this previously. He reports that he is very active. He does his own yard work. He does however have to sit down and rest when he gets home because he is "tired".  On day of admission, while walking around at [facility], he developed acute dyspnea and fatigue. He had to sit down and rest because of fatigue. He was brought to [facility] where he was found to have a relatively regular wide complex tachycardia with QRS similar to his chronic LBBB. He was admitted and started on heparin gtt and amiodarone. He did well overnight and spontaneously converted to sinus rhythm. His symptoms have completely resolved and he is now back at baseline. He did not have palpitations with the episode and does not think that he has had afib or tachycardias in the past. He did not have syncope with the episode. His wife denies a snoring or apnea history. He was offered genetic af screening, which still in the plan,and he is agreeable to,  but  tikosyn/amiodarone or consideration for BiVICD  declined by patient. He was started on Xarleto for chadsvasc score of at lest 4. Is tolerating without bleeding issues.   Today, he denies symptoms of palpitations, chest pain, shortness of breath, orthopnea, PND, lower extremity edema, dizziness, presyncope, syncope, or neurologic sequela. The patient is tolerating medications without difficulties and is otherwise without complaint today.   Past Medical History  Diagnosis Date  . Cardiomyopathy     . Personal history of kidney stones   . Cardiomyopathy   . CHF (congestive heart failure)   . Diabetes mellitus   . Hypercholesterolemia   . Hypertension   . Diverticulosis    Past Surgical History  Procedure Laterality Date  . Shoulder surgery  1981    left. Muscle reattachment  . Tonsillectomy    . Toe surgery Right 12/2012    2nd toe  . Colonoscopy N/A 12/29/2013    Procedure: COLONOSCOPY;  Surgeon: Lafayette Dragon, MD;  Location: [facility] ENDOSCOPY;  Service: Endoscopy;  Laterality: N/A;    Current Outpatient Prescriptions  Medication Sig Dispense Refill  . Ascorbic Acid (VITAMIN C) 500 MG CAPS Take 1 capsule by mouth 2 (two) times daily.     . Bilberry, Vaccinium myrtillus, (BILBERRY EXTRACT PO) Take 1,000 mg by mouth daily. 1.000mg  once a day    . carvedilol (COREG) 25 MG tablet Take 1 tablet (25 mg total) by mouth 2 (two) times daily with a meal. 180 tablet 1  . CINNAMON PO Take 1 tablet by mouth every evening.     . diphenhydramine-acetaminophen (TYLENOL PM) 25-500 MG TABS Take 2 tablets by mouth at bedtime as needed.     . insulin NPH Human (HUMULIN N) 100 UNIT/ML injection 85 units each morning, and 50 units each evening (Patient taking differently: Inject 50-85 Units into the skin 2 (two) times daily before a meal. 85 units each morning, and 50 units each evening) 50 mL 11  . Multiple Vitamin (MULTIVITAMIN) tablet Take 1 tablet by  mouth daily.      . rivaroxaban (XARELTO) 20 MG TABS tablet Take 1 tablet (20 mg total) by mouth daily with supper. 30 tablet 10  . simvastatin (ZOCOR) 40 MG tablet Take 40 mg by mouth at bedtime.      . TRILIPIX 135 MG capsule Take 1 tablet by mouth daily.     No current facility-administered medications for this visit.    Allergies  Allergen Reactions  . Aspirin Other (See Comments)    Burps, burning, stomach pains, etc  . Penicillins Other (See Comments)    syncope    History   Social History  . Marital Status: Married    Spouse Name:  Rosemarie Beath  . Number of Children: 1  . Years of Education: 12   Occupational History  . retired    Social History Main Topics  . Smoking status: Former Smoker    Types: Cigarettes  . Smokeless tobacco: Never Used  . Alcohol Use: No  . Drug Use: No  . Sexual Activity: Not on file   Other Topics Concern  . Not on file   Social History Narrative   Regular exercise-no          Family History  Problem Relation Age of Onset  . Heart failure Maternal Grandmother   . Diabetes type II Sister 17  . Hyperlipidemia Other     Parent  . Stroke Other     Parent  . Hypertension Other     Parent  . Diabetes Other     Parent  . Colon cancer Neg Hx     ROS- All systems are reviewed and negative except as per the HPI above  Physical Exam: Filed Vitals:   01/10/15 1140  BP: 130/72  Pulse: 84  Height: 5\' 11"  (1.803 m)  Weight: 258 lb 12.8 oz (117.391 kg)    GEN- The patient is well appearing, alert and oriented x 3 today.   Head- normocephalic, atraumatic Eyes-  Sclera clear, conjunctiva pink Ears- hearing intact Oropharynx- clear Neck- supple, no JVP Lymph- no cervical lymphadenopathy Lungs- Clear to ausculation bilaterally, normal work of breathing Heart- Regular rate and rhythm, no murmurs, rubs or gallops, PMI not laterally displaced GI- soft, NT, ND, + BS Extremities- no clubbing, cyanosis, or edema MS- no significant deformity or atrophy Skin- no rash or lesion Psych- euthymic mood, full affect Neuro- strength and sensation are intact   Epic records are reviewed. EKG-SR with LBBB,at 84 bpm with QTc at 505 ms. Echo-  06/12/14-Left ventricle: The cavity size was mildly dilated. There was mild concentric hypertrophy. Systolic function was severely reduced. The estimated ejection fraction was in the range of 25% to 30%. Diffuse hypokinesis. - Ventricular septum: Septal motion showed LBBB related dyssynergy, and paradoxical motion. - Aortic valve: There was  trivial regurgitation.  Assessment and Plan:  1. Reviewed with Dr. Rayann Heman. He would like him screened at soon as possible for gentic af. I  have been in touch with research who will try to bring him in  Monday to enroll and for necessary blood work. 2. Reviewed with pt again re Dr. Jackalyn Lombard recent recommendations and pt and wife still defer BiV ICD at this point. They will entertain ADD therapy once results of genetic AF are known. 3. They ask me to let Dr. Aundra Dubin know that they are now interested in having a CPX.  4. Encouraged to continue   Xarleto to reduce stroke risk. Wife has sone concerns re cost  and she was encouraged to contact insurance company. 5. F/u with Dr. Rayann Heman in 3 weeks.

## 2015-01-11 ENCOUNTER — Telehealth: Payer: Self-pay | Admitting: *Deleted

## 2015-01-11 DIAGNOSIS — I428 Other cardiomyopathies: Secondary | ICD-10-CM

## 2015-01-11 NOTE — Telephone Encounter (Signed)
Could you please arrange for Justin Hill to get a CPX done? Thanks.      [place]    ----- Message -----     From: Roderic Palau, NP     Sent: 01/10/2015  2:14 PM      To: Larey Dresser, MD        Hello Dr. Aundra Dubin, Justin Hill was in hospital recently with AFIB with rvr and Dr. Rayann Heman saw in consult .I saw in f/u today. The pt wanted me to let you know that he is agreeable to have a CPX now.    Thanks, Roderic Palau

## 2015-01-11 NOTE — Telephone Encounter (Signed)
LMTCB

## 2015-01-12 NOTE — Telephone Encounter (Signed)
I spoke with wife about scheduling CPX. She is going to call me back and let me know pt's decision about scheduling CPX.

## 2015-01-12 NOTE — Telephone Encounter (Signed)
LMTCB

## 2015-01-12 NOTE — Telephone Encounter (Signed)
New Message     Patient is returning call.  Please call back Thanks

## 2015-01-15 ENCOUNTER — Other Ambulatory Visit: Payer: Self-pay

## 2015-01-15 MED ORDER — INSULIN NPH (HUMAN) (ISOPHANE) 100 UNIT/ML ~~LOC~~ SUSP: SUBCUTANEOUS | Status: DC

## 2015-01-15 NOTE — Progress Notes (Signed)
Genetic AF Informed Consent  Subject Name: Justin Hill  Subject met inclusion and exclusion criteria. The informed consent form, study requirements and expectations were reviewed with the subject and questions and concerns were addressed prior to the signing of the consent form. The subject verbalized understanding of the trail requirements. The subject agreed to participate in the Genetic AF trial and signed the informed consent. The informed consent was obtained prior to performance of any protocol-specific procedures for the subject. A copy of the signed informed consent was given to the subject and a copy was placed in the subject's medical record.   Pamala Duffel  01/15/2015 @ 13:30

## 2015-01-16 ENCOUNTER — Inpatient Hospital Stay (HOSPITAL_COMMUNITY)
Admission: EM | Admit: 2015-01-16 | Discharge: 2015-01-16 | DRG: 310 | Disposition: A | Discharge status: Home / Self Care | Payer: Medicare Other | Attending: Internal Medicine | Admitting: Internal Medicine

## 2015-01-16 ENCOUNTER — Encounter (HOSPITAL_COMMUNITY): Payer: Self-pay

## 2015-01-16 DIAGNOSIS — N183 Chronic kidney disease, stage 3 unspecified: Secondary | ICD-10-CM | POA: Diagnosis present

## 2015-01-16 DIAGNOSIS — Z794 Long term (current) use of insulin: Secondary | ICD-10-CM

## 2015-01-16 DIAGNOSIS — I447 Left bundle-branch block, unspecified: Secondary | ICD-10-CM | POA: Diagnosis present

## 2015-01-16 DIAGNOSIS — R531 Weakness: Secondary | ICD-10-CM | POA: Diagnosis not present

## 2015-01-16 DIAGNOSIS — E669 Obesity, unspecified: Secondary | ICD-10-CM | POA: Diagnosis present

## 2015-01-16 DIAGNOSIS — I471 Supraventricular tachycardia: Secondary | ICD-10-CM | POA: Diagnosis present

## 2015-01-16 DIAGNOSIS — E119 Type 2 diabetes mellitus without complications: Secondary | ICD-10-CM | POA: Diagnosis present

## 2015-01-16 DIAGNOSIS — I4892 Unspecified atrial flutter: Secondary | ICD-10-CM | POA: Diagnosis not present

## 2015-01-16 DIAGNOSIS — I472 Ventricular tachycardia: Secondary | ICD-10-CM | POA: Diagnosis not present

## 2015-01-16 DIAGNOSIS — I110 Hypertensive heart disease with heart failure: Secondary | ICD-10-CM | POA: Diagnosis not present

## 2015-01-16 DIAGNOSIS — Z6836 Body mass index (BMI) 36.0-36.9, adult: Secondary | ICD-10-CM

## 2015-01-16 DIAGNOSIS — I2510 Atherosclerotic heart disease of native coronary artery without angina pectoris: Secondary | ICD-10-CM | POA: Diagnosis present

## 2015-01-16 DIAGNOSIS — I429 Cardiomyopathy, unspecified: Secondary | ICD-10-CM | POA: Diagnosis present

## 2015-01-16 DIAGNOSIS — E7800 Pure hypercholesterolemia, unspecified: Secondary | ICD-10-CM | POA: Diagnosis present

## 2015-01-16 DIAGNOSIS — I10 Essential (primary) hypertension: Secondary | ICD-10-CM | POA: Diagnosis present

## 2015-01-16 DIAGNOSIS — I509 Heart failure, unspecified: Secondary | ICD-10-CM | POA: Diagnosis present

## 2015-01-16 DIAGNOSIS — I129 Hypertensive chronic kidney disease with stage 1 through stage 4 chronic kidney disease, or unspecified chronic kidney disease: Secondary | ICD-10-CM | POA: Diagnosis present

## 2015-01-16 DIAGNOSIS — Z79899 Other long term (current) drug therapy: Secondary | ICD-10-CM

## 2015-01-16 DIAGNOSIS — I4891 Unspecified atrial fibrillation: Secondary | ICD-10-CM | POA: Diagnosis present

## 2015-01-16 DIAGNOSIS — Z87891 Personal history of nicotine dependence: Secondary | ICD-10-CM

## 2015-01-16 DIAGNOSIS — R42 Dizziness and giddiness: Secondary | ICD-10-CM | POA: Diagnosis not present

## 2015-01-16 DIAGNOSIS — I428 Other cardiomyopathies: Secondary | ICD-10-CM

## 2015-01-16 LAB — BASIC METABOLIC PANEL
Anion gap: 7 (ref 5–15)
BUN: 25 mg/dL — ABNORMAL HIGH (ref 6–23)
CHLORIDE: 106 mmol/L (ref 96–112)
CO2: 26 mmol/L (ref 19–32)
CREATININE: 1.53 mg/dL — AB (ref 0.50–1.35)
Calcium: 9.2 mg/dL (ref 8.4–10.5)
GFR calc Af Amer: 51 mL/min — ABNORMAL LOW (ref >90)
GFR, EST NON AFRICAN AMERICAN: 44 mL/min — AB (ref >90)
GLUCOSE: 146 mg/dL — AB (ref 70–99)
Potassium: 4.5 mmol/L (ref 3.5–5.1)
SODIUM: 139 mmol/L (ref 135–145)

## 2015-01-16 LAB — TROPONIN I: Troponin I: 0.06 ng/mL — ABNORMAL HIGH (ref <0.031)

## 2015-01-16 LAB — CBC
HCT: 44.9 % (ref 39.0–52.0)
Hemoglobin: 16.2 g/dL (ref 13.0–17.0)
MCH: 30.4 pg (ref 26.0–34.0)
MCHC: 36.1 g/dL — ABNORMAL HIGH (ref 30.0–36.0)
MCV: 84.2 fL (ref 78.0–100.0)
PLATELETS: 151 10*3/uL (ref 150–400)
RBC: 5.33 MIL/uL (ref 4.22–5.81)
RDW: 13.6 % (ref 11.5–15.5)
WBC: 8.1 10*3/uL (ref 4.0–10.5)

## 2015-01-16 LAB — CBG MONITORING, ED: GLUCOSE-CAPILLARY: 67 mg/dL — AB (ref 70–99)

## 2015-01-16 LAB — BRAIN NATRIURETIC PEPTIDE: B NATRIURETIC PEPTIDE 5: 412.4 pg/mL — AB (ref 0.0–100.0)

## 2015-01-16 LAB — I-STAT TROPONIN, ED: Troponin i, poc: 0.04 ng/mL (ref 0.00–0.08)

## 2015-01-16 MED ORDER — ETOMIDATE 2 MG/ML IV SOLN
10.0000 mg | Freq: Once | INTRAVENOUS | Status: AC
  Administered 2015-01-16: 10 mg via INTRAVENOUS
  Filled 2015-01-16: qty 10

## 2015-01-16 MED ORDER — AMIODARONE HCL IN DEXTROSE 360-4.14 MG/200ML-% IV SOLN
60.0000 mg/h | Freq: Once | INTRAVENOUS | Status: AC
  Administered 2015-01-16: 60 mg/h via INTRAVENOUS
  Filled 2015-01-16: qty 200

## 2015-01-16 MED ORDER — SODIUM CHLORIDE 0.9 % IJ SOLN: 3.0000 mL | Freq: Two times a day (BID) | INTRAMUSCULAR | Status: DC

## 2015-01-16 MED ORDER — SODIUM CHLORIDE 0.9 % IJ SOLN: 3.0000 mL | INTRAMUSCULAR | Status: DC | PRN

## 2015-01-16 MED ORDER — INSULIN NPH (HUMAN) (ISOPHANE) 100 UNIT/ML ~~LOC~~ SUSP
50.0000 [IU] | Freq: Every day | SUBCUTANEOUS | Status: DC
  Filled 2015-01-16: qty 10

## 2015-01-16 MED ORDER — VITAMIN C 500 MG PO TABS
500.0000 mg | ORAL_TABLET | Freq: Two times a day (BID) | ORAL | Status: DC
  Filled 2015-01-16: qty 1

## 2015-01-16 MED ORDER — DIPHENHYDRAMINE-APAP (SLEEP) 25-500 MG PO TABS: 2.0000 | ORAL_TABLET | Freq: Every evening | ORAL | Status: DC | PRN

## 2015-01-16 MED ORDER — ONDANSETRON HCL 4 MG/2ML IJ SOLN: 4.0000 mg | Freq: Four times a day (QID) | INTRAMUSCULAR | Status: DC | PRN

## 2015-01-16 MED ORDER — ACETAMINOPHEN 325 MG PO TABS: 650.0000 mg | ORAL_TABLET | ORAL | Status: DC | PRN

## 2015-01-16 MED ORDER — NITROGLYCERIN 0.4 MG SL SUBL: 0.4000 mg | SUBLINGUAL_TABLET | SUBLINGUAL | Status: DC | PRN

## 2015-01-16 MED ORDER — FENOFIBRATE 54 MG PO TABS: 54.0000 mg | ORAL_TABLET | Freq: Every day | ORAL | Status: DC

## 2015-01-16 MED ORDER — ALPRAZOLAM 0.5 MG PO TABS: 0.2500 mg | ORAL_TABLET | Freq: Two times a day (BID) | ORAL | Status: DC | PRN

## 2015-01-16 MED ORDER — SODIUM CHLORIDE 0.9 % IV SOLN: 250.0000 mL | INTRAVENOUS | Status: DC | PRN

## 2015-01-16 MED ORDER — VITAMIN C 500 MG PO CAPS
1.0000 | ORAL_CAPSULE | Freq: Two times a day (BID) | ORAL | Status: DC
Start: 2015-01-17 — End: 2015-01-16

## 2015-01-16 MED ORDER — ZOLPIDEM TARTRATE 5 MG PO TABS: 5.0000 mg | ORAL_TABLET | Freq: Every evening | ORAL | Status: DC | PRN

## 2015-01-16 MED ORDER — AMIODARONE IV BOLUS ONLY 150 MG/100ML
150.0000 mg | Freq: Once | INTRAVENOUS | Status: AC
  Administered 2015-01-16: 150 mg via INTRAVENOUS

## 2015-01-16 MED ORDER — CARVEDILOL 25 MG PO TABS
25.0000 mg | ORAL_TABLET | Freq: Two times a day (BID) | ORAL | Status: DC
  Administered 2015-01-16: 25 mg via ORAL
  Filled 2015-01-16 (×2): qty 1

## 2015-01-16 MED ORDER — INSULIN ASPART 100 UNIT/ML ~~LOC~~ SOLN: 0.0000 [IU] | Freq: Three times a day (TID) | SUBCUTANEOUS | Status: DC

## 2015-01-16 MED ORDER — INSULIN ASPART 100 UNIT/ML ~~LOC~~ SOLN: 0.0000 [IU] | Freq: Every day | SUBCUTANEOUS | Status: DC

## 2015-01-16 MED ORDER — INSULIN NPH (HUMAN) (ISOPHANE) 100 UNIT/ML ~~LOC~~ SUSP
85.0000 [IU] | Freq: Every day | SUBCUTANEOUS | Status: DC
  Filled 2015-01-16: qty 10

## 2015-01-16 MED ORDER — ADULT MULTIVITAMIN W/MINERALS CH: 1.0000 | ORAL_TABLET | Freq: Every day | ORAL | Status: DC

## 2015-01-16 MED ORDER — RIVAROXABAN 20 MG PO TABS: 20.0000 mg | ORAL_TABLET | Freq: Every day | ORAL | Status: DC

## 2015-01-16 MED ORDER — SODIUM CHLORIDE 0.9 % IV SOLN
Freq: Once | INTRAVENOUS | Status: AC
  Administered 2015-01-16: 14:00:00 via INTRAVENOUS

## 2015-01-16 NOTE — ED Notes (Signed)
Patient CBG was 67 reported to Dr.Pfeiffer.

## 2015-01-16 NOTE — ED Notes (Signed)
Pt here for afib. Was admitted Friday and d/c Sunday with same. Some SOB and lightheadedness with it. Denies any pain with it.

## 2015-01-16 NOTE — ED Notes (Signed)
Alert and oriented; given regular Coke for CBG 67.

## 2015-01-16 NOTE — Sedation Documentation (Signed)
Family arrived at bedside; Dr. Rayann Heman back in room discussing care with family.

## 2015-01-16 NOTE — Discharge Instructions (Signed)
Pt to return to [facility] short stay on this Friday 01/19/15 at [place].  NPO except medicines midnight Thursday.  He should continue xarelto without interruption No medicine changes at this time.   Atrial Flutter Atrial flutter is a heart rhythm that can cause the heart to beat very fast (tachycardia). It originates in the upper chambers of the heart (atria). In atrial flutter, the top chambers of the heart (atria) often beat much faster than the bottom chambers of the heart (ventricles). Atrial flutter has a regular "saw toothed" appearance in an EKG readout. An EKG is a test that records the electrical activity of the heart. Atrial flutter can cause the heart to beat up to 150 beats per minute (BPM). Atrial flutter can either be short lived (paroxysmal) or permanent.  CAUSES  Causes of atrial flutter can be many. Some of these include:  Heart related issues:  Heart attack (myocardial infarction).  Heart failure.  Heart valve problems.  Poorly controlled high blood pressure (hypertension).  After open heart surgery.  Lung related issues:  A blood clot in the lungs (pulmonary embolism).  Chronic obstructive pulmonary disease (COPD). Medications used to treat COPD can attribute to atrial flutter.  Other related causes:  Hyperthyroidism.  Caffeine.  Some decongestant cold medications.  Low electrolyte levels such as potassium or magnesium.  Cocaine. SYMPTOMS  An awareness of your heart beating rapidly (palpitations).  Shortness of breath.  Chest pain.  Low blood pressure (hypotension).  Dizziness or fainting. DIAGNOSIS  Different tests can be performed to diagnose atrial flutter.   An EKG.  Holter monitor. This is a 24-hour recording of your heart rhythm. You will also be given a diary. Write down all symptoms that you have and what you were doing at the time you experienced symptoms.  Cardiac event monitor. This small device can be worn for up to 30 days. When  you have heart symptoms, you will push a button on the device. This will then record your heart rhythm.  Echocardiogram. This is an imaging test to look at your heart. Your caregiver will look at your heart valves and the ventricles.  Stress test. This test can help determine if the atrial flutter is related to exercise or if coronary artery disease is present.  Laboratory studies will look at certain blood levels like:  Complete blood count (CBC).  Potassium.  Magnesium.  Thyroid function. TREATMENT  Treatment of atrial flutter varies. A combination of therapies may be used or sometimes atrial flutter may need only 1 type of treatment.  Lab work: If your blood work, such as your electrolytes (potassium, magnesium) or your thyroid function tests, are abnormal, your caregiver will treat them accordingly.  Medication:  There are several different types of medications that can convert your heart to a normal rhythm and prevent atrial flutter from reoccurring.  Nonsurgical procedures: Nonsurgical techniques may be used to control atrial flutter. Some examples include:  Cardioversion. This technique uses either drugs or an electrical shock to restore a normal heart rhythm:  Cardioversion drugs may be given through an intravenous (IV) line to help "reset" the heart rhythm.  In electrical cardioversion, your caregiver shocks your heart with electrical energy. This helps to reset the heartbeat to a normal rhythm.  Ablation. If atrial flutter is a persistent problem, an ablation may be needed. This procedure is done under mild sedation. High frequency radio-wave energy is used to destroy the area of heart tissue responsible for atrial flutter. [place]  CARE IF:  You have:  Dizziness.  Near fainting or fainting.  Shortness of breath.  Chest pain or pressure.  Sudden nausea or vomiting.  Profuse sweating. If you have the above symptoms, call your local emergency service  immediately! Do not drive yourself to the hospital. MAKE SURE YOU:   Understand these instructions.  Will watch your condition.  Will get help right away if you are not doing well or get worse. Document Released: 03/29/2009 Document Revised: 03/27/2014 Document Reviewed: 03/29/2009 [facility] Patient Information 2015 [place], [place]. This information is not intended to replace advice given to you by your health care provider. Make sure you discuss any questions you have with your health care provider.

## 2015-01-16 NOTE — H&P (Signed)
Patient ID: Ramona Clarida MRN: [id number], DOB/AGE: Mar 07, 1941   Admit date: 01/16/2015   Primary Physician: Cyndy Freeze, MD Primary Cardiologist: Dr Aundra Dubin  HPI: 74 yo, previously followed by Dr Lia Foyer, with history CKD, HTN, diabetes, chronic LBBB, and nonischemic cardiomyopathy (mild CAD in 2008). His cardiomyopathy has been known for years. Most recent echo in July 2015 showed EF 25-30%, which is stable. ICD and BiV has been discussed in the past, but decided against in the past given NYHA class I symptoms.The pt was doing well until 01/06/15 when he was admitted with [facility] with fatigue. He was seen in consult by Dr Rayann Heman who felt this was an SVT. The pt had been placed on Amiodarone IV on admission and di convert to NSR. Amiodarone was stopped. Dr Rayann Heman discussed treatment options with the patient (see his consult note from 01/06/15).  Further EP evaluation was offered, including RFA, but the pt declined at that time. He pt was enrolled in the Genetic AF trial and started on Xarelto,  He was discharged 01/07/15. He was seen in the AF clinic by Roderic Palau 01/10/15 and did well until 8 pm last night when he felt weak. His symptoms improved somewhat and he went on to bed though his says she stayed up all night with  Him observing him for an problems. In the morning he felt improved and they went on to a previously scheduled visit wioth their PCP. At the office he felt faint. HR was noted to be 150. He was sent on to the ED. Her is is in a WCT with rate 140. He denies chest pain or SOB, just "weak". IV Amiodarone started in ED.    Problem List: Past Medical History  Diagnosis Date  . Cardiomyopathy   . Personal history of kidney stones   . Cardiomyopathy   . CHF (congestive heart failure)   . Diabetes mellitus   . Hypercholesterolemia   . Hypertension   . Diverticulosis   . A-fib     Past Surgical History  Procedure Laterality Date  . Shoulder surgery  1981    left.  Muscle reattachment  . Tonsillectomy    . Toe surgery Right 12/2012    2nd toe  . Colonoscopy N/A 12/29/2013    Procedure: COLONOSCOPY;  Surgeon: Lafayette Dragon, MD;  Location: [facility];  Service: Endoscopy;  Laterality: N/A;     Allergies:  Allergies  Allergen Reactions  . Aspirin Other (See Comments)    Burps, burning, stomach pains, etc  . Penicillins Other (See Comments)    syncope     Home Medications No current facility-administered medications for this encounter.   Current Outpatient Prescriptions  Medication Sig Dispense Refill  . Ascorbic Acid (VITAMIN C) 500 MG CAPS Take 1 capsule by mouth 2 (two) times daily.     . Bilberry, Vaccinium myrtillus, (BILBERRY EXTRACT PO) Take 1,000 mg by mouth daily. 1.000mg  once a day    . carvedilol (COREG) 25 MG tablet Take 1 tablet (25 mg total) by mouth 2 (two) times daily with a meal. 180 tablet 1  . CINNAMON PO Take 1 tablet by mouth every evening.     . diphenhydramine-acetaminophen (TYLENOL PM) 25-500 MG TABS Take 2 tablets by mouth at bedtime as needed.     . insulin NPH Human (HUMULIN N) 100 UNIT/ML injection 85 units each morning, and 50 units each evening 50 mL 11  . Multiple Vitamin (MULTIVITAMIN) tablet Take 1 tablet by mouth  daily.      . rivaroxaban (XARELTO) 20 MG TABS tablet Take 1 tablet (20 mg total) by mouth daily with supper. 30 tablet 10  . simvastatin (ZOCOR) 40 MG tablet Take 40 mg by mouth at bedtime.      . TRILIPIX 135 MG capsule Take 1 tablet by mouth daily.       Family History  Problem Relation Age of Onset  . Heart failure Maternal Grandmother   . Diabetes type II Sister 24  . Hyperlipidemia Other     Parent  . Stroke Other     Parent  . Hypertension Other     Parent  . Diabetes Other     Parent  . Colon cancer Neg Hx      History   Social History  . Marital Status: Married    Spouse Name: Rosemarie Beath  . Number of Children: 1  . Years of Education: 12   Occupational History  . retired     Social History Main Topics  . Smoking status: Former Smoker    Types: Cigarettes  . Smokeless tobacco: Never Used  . Alcohol Use: No  . Drug Use: No  . Sexual Activity: Not on file   Other Topics Concern  . Not on file   Social History Narrative   Regular exercise-no           Review of Systems: General: negative for chills, fever, night sweats or weight changes.  Cardiovascular: negative for chest pain, dyspnea on exertion, edema, orthopnea, palpitations, paroxysmal nocturnal dyspnea or shortness of breath Dermatological: negative for rash Respiratory: negative for cough or wheezing Urologic: negative for hematuria Abdominal: negative for nausea, vomiting, diarrhea, bright red blood per rectum, melena, or hematemesis Neurologic: negative for visual changes, syncope, or dizziness All other systems reviewed and are otherwise negative except as noted above.  Physical Exam: Blood pressure 122/67, pulse 144, temperature 97.8 F (36.6 C), temperature source Oral, resp. rate 15, height 5\' 11"  (1.803 m), weight 259 lb 12.8 oz (117.845 kg), SpO2 96 %.  General appearance: alert, cooperative, no distress and morbidly obese Neck: no carotid bruit and no JVD Lungs: clear to auscultation bilaterally Heart: rapid rate, no murmur appreciated Abdomen: obese Extremities: no edema Pulses: faint Skin: pale cool dry Neurologic: Grossly normal    Labs:   Results for orders placed or performed during the hospital encounter of 01/16/15 (from the past 24 hour(s))  Basic metabolic panel     Status: Abnormal   Collection Time: 01/16/15  1:08 PM  Result Value Ref Range   Sodium 139 135 - 145 mmol/L   Potassium 4.5 3.5 - 5.1 mmol/L   Chloride 106 96 - 112 mmol/L   CO2 26 19 - 32 mmol/L   Glucose, Bld 146 (H) 70 - 99 mg/dL   BUN 25 (H) 6 - 23 mg/dL   Creatinine, Ser 1.53 (H) 0.50 - 1.35 mg/dL   Calcium 9.2 8.4 - 10.5 mg/dL   GFR calc non Af Amer 44 (L) >90 mL/min   GFR calc Af Amer  51 (L) >90 mL/min   Anion gap 7 5 - 15  CBC     Status: Abnormal   Collection Time: 01/16/15  1:08 PM  Result Value Ref Range   WBC 8.1 4.0 - 10.5 K/uL   RBC 5.33 4.22 - 5.81 MIL/uL   Hemoglobin 16.2 13.0 - 17.0 g/dL   HCT 44.9 39.0 - 52.0 %   MCV 84.2 78.0 - 100.0  fL   MCH 30.4 26.0 - 34.0 pg   MCHC 36.1 (H) 30.0 - 36.0 g/dL   RDW 13.6 11.5 - 15.5 %   Platelets 151 150 - 400 K/uL  Troponin I     Status: Abnormal   Collection Time: 01/16/15  1:08 PM  Result Value Ref Range   Troponin I 0.06 (H) <0.031 ng/mL  Brain natriuretic peptide     Status: Abnormal   Collection Time: 01/16/15  1:13 PM  Result Value Ref Range   B Natriuretic Peptide 412.4 (H) 0.0 - 100.0 pg/mL  I-stat troponin, ED (do not order at [facility])     Status: None   Collection Time: 01/16/15  1:56 PM  Result Value Ref Range   Troponin i, poc 0.04 0.00 - 0.08 ng/mL   Comment 3             Radiology/Studies: Dg Chest Port 1 View  01/05/2015   CLINICAL DATA:  Dizziness. Shortness of breath. Tachycardia. Cardiomyopathy.  EXAM: PORTABLE CHEST - 1 VIEW  COMPARISON:  02/23/2009  FINDINGS: Low lung volumes are noted as well as lordotic positioning. Both lungs are grossly clear. Heart size is within normal limits.  IMPRESSION: No acute findings.   Electronically Signed   By: Earle Gell M.D.   On: 01/05/2015 21:35    EKG: WCT, LBBB, rate 147  ASSESSMENT AND PLAN:  Active Problems:   Nonischemic cardiomyopathy   Atrial flutter with rapid ventricular response   Hypertension   CKD (chronic kidney disease) stage 3, GFR 30-59 ml/min   Hypercholesterolemia   LBBB (left bundle branch block)   Obesity (BMI 30-39.9)   CAD- mild, non obstructive CAD 2008   PLAN: Continue IV Amiodarone, (will add bolus) and Xarelto.   Henri Medal, PA-C 01/16/2015, 2:32 PM   I have personally seen and examined this patient with Kerin Ransom, PA-C. I agree with the assessment and plan as outlined above. He has a complex medical  history with cardiomyopathy and has not wished to consider an ICD in the past, which seemed appropriate with lack of symptoms. Over last few weeks, has had issues with SVT/atrial flutter. He was admitted here 01/05/15 and was seen by Dr. Rayann Heman 01/06/15. He converted to sinus with IV amiodarone. He was started on Xarelto and sent home with Coreg at home dose only. Follow up in atrial fib clinic with NP 01/10/15 and was in sinus. Now admitted with recurrent wide complex tachycardia. QRS width is the same as his baseline LBBB. This is very regular and appears to be atrial flutter. Will admit to telemetry unit and continue IV amiodarone with bolus. Continue home meds. EP to see in am to discuss options.   Scout Guyett 01/16/2015 3:40 PM

## 2015-01-16 NOTE — ED Notes (Signed)
Consent signed.

## 2015-01-16 NOTE — Sedation Documentation (Signed)
Opened eyes at 1736

## 2015-01-16 NOTE — CV Procedure (Addendum)
EP procedure Note   Pre procedure Diagnosis:  Atrial flutter Post procedure Diagnosis:  Same  Procedures:  Electrical cardioversion  Description:  Informed, written consent was obtained for cardioversion.  Adequate IV acces and airway support were assured.  The patient was adequately sedated with intravenous etomadate as outlined in the ED nurse report.  The patient presented today in atrial flutter.  He was successfully cardioverted to sinus rhythm with a single synchronized biphasic 200J shock delivered with cardioversion electrodes placed in the anterior/posterior configuration.  He remains in sinus rhythm thereafter. EBL 58ml. There were no early apparent complications.  Conclusions:  1.  Successful cardioversion of atrial flutter to sinus rhythm 2.  No early apparent complications.   Justin Hill 5:36 PM 01/16/2015   Pt doing well s/p cardioversion by me.  He remains stable clinically. OK to discharge to home from ED once awake. Pt to return to short stay on this Friday 01/19/15 at [place].  NPO except medicines midnight Thursday.  He should continue xarelto without interruption No medicine changes at this time.  [name] MD 01/16/2015 5:41 PM

## 2015-01-16 NOTE — Consult Note (Addendum)
ELECTROPHYSIOLOGY CONSULT NOTE    Patient ID: Justin Hill MRN: [id number], DOB/AGE: [birth year] 74 y.o.  Admit date: 01/16/2015 Date of Consult: 01/16/2015  Primary Physician: Cyndy Freeze, MD  Primary Cardiologist: Aundra Dubin Electrophysiologist: Loris Seelye Referring Physician: Julianne Handler  Reason for Consultation: atrial flutter  HPI:  Justin Hill is a 74 y.o. male with h/o nonischemic CM, chronic systolic dysfunction, LBBB, diabetes, HTN, obesity, and wide complex tachycardia. The patient is felt to have chronically NYHA Class I symptoms. He was planned to have a CPX test but has declined this previously.  He reports that he is very active. He does his own yard work. He does however have to sit down and rest when he gets home because he is "tired".   He was brought to [facility] earlier this month where he was found to have a relatively regular wide complex tachycardia with QRS similar to his chronic LBBB. He was admitted and started on heparin gtt and amiodarone. He did well overnight and spontaneously converted to sinus rhythm. His symptoms have completely resolved and he is now back at baseline. He did not have palpitations with the episode and does not think that he has had afib or tachycardias in the past. He did not have syncope with the episode. His wife does not think that he snores.    He has been screened for genetic AF, but his lab work is not resulted yet.    Last night around 8PM, he again developed palpitations associated with dizziness. He went home and this morning felt improved.  He then went to a previously scheduled appt with his PCP and was found to be tachycardic and was advised to come to the ER for evaluation.   He denies chest pain, shortness of breath, syncope, recent fevers, chills, nausea or vomiting.  He reports compliance with Xarelto and has not eaten today. ROS is otherwise negative except as outlined above.   Past Medical  History  Diagnosis Date  . Cardiomyopathy   . Personal history of kidney stones   . Cardiomyopathy   . CHF (congestive heart failure)   . Diabetes mellitus   . Hypercholesterolemia   . Hypertension   . Diverticulosis   . A-fib      Surgical History:  Past Surgical History  Procedure Laterality Date  . Shoulder surgery  1981    left. Muscle reattachment  . Tonsillectomy    . Toe surgery Right 12/2012    2nd toe  . Colonoscopy N/A 12/29/2013    Procedure: COLONOSCOPY;  Surgeon: Lafayette Dragon, MD;  Location: [facility];  Service: Endoscopy;  Laterality: N/A;      Inpatient Medications:  . carvedilol  25 mg Oral BID WC  . [START ON 01/17/2015] fenofibrate  54 mg Oral Daily  . insulin aspart  0-5 Units Subcutaneous QHS  . insulin aspart  0-9 Units Subcutaneous TID WC  . insulin NPH Human  50 Units Subcutaneous QHS  . [START ON 01/17/2015] insulin NPH Human  85 Units Subcutaneous QAC breakfast  . [START ON 01/17/2015] multivitamin with minerals  1 tablet Oral Daily  . [START ON 01/17/2015] rivaroxaban  20 mg Oral Q supper  . sodium chloride  3 mL Intravenous Q12H  . vitamin C  500 mg Oral BID    Allergies:  Allergies  Allergen Reactions  . Aspirin Other (See Comments)    Burps, burning, stomach pains, etc  . Penicillins Other (See Comments)    syncope  History   Social History  . Marital Status: Married    Spouse Name: Rosemarie Beath  . Number of Children: 1  . Years of Education: 12   Occupational History  . retired    Social History Main Topics  . Smoking status: Former Smoker    Types: Cigarettes  . Smokeless tobacco: Never Used  . Alcohol Use: No  . Drug Use: No  . Sexual Activity: Not on file   Other Topics Concern  . Not on file   Social History Narrative   Regular exercise-no           Family History  Problem Relation Age of Onset  . Heart failure Maternal Grandmother   . Diabetes type II Sister 61  . Hyperlipidemia Other     Parent  . Stroke  Other     Parent  . Hypertension Other     Parent  . Diabetes Other     Parent  . Colon cancer Neg Hx     BP 94/60 mmHg  Pulse 142  Temp(Src) 97.8 F (36.6 C) (Oral)  Resp 15  Ht 5\' 11"  (1.803 m)  Wt 259 lb 12.8 oz (117.845 kg)  BMI 36.25 kg/m2  SpO2 95%  Physical Exam: Filed Vitals:   01/16/15 1500 01/16/15 1530 01/16/15 1600 01/16/15 1630  BP: 105/58 111/75 99/71 94/60   Pulse: 141 139 139 142  Temp:      TempSrc:      Resp: 16 17 18 15   Height:      Weight:      SpO2: 95% 96% 95% 95%    GEN- The patient is well appearing, alert and oriented x 3 today.   Head- normocephalic, atraumatic Eyes-  Sclera clear, conjunctiva pink Ears- hearing intact Oropharynx- clear Neck- supple, Lungs- Clear to ausculation bilaterally, normal work of breathing Heart- irregular rate and rhythm GI- soft, NT, ND, + BS Extremities- no clubbing, cyanosis, or edema  MS- no significant deformity or atrophy Skin- no rash or lesion Psych- euthymic mood, full affect Neuro- strength and sensation are intact    Labs:   Lab Results  Component Value Date   WBC 8.1 01/16/2015   HGB 16.2 01/16/2015   HCT 44.9 01/16/2015   MCV 84.2 01/16/2015   PLT 151 01/16/2015    Recent Labs Lab 01/16/15 1308  NA 139  K 4.5  CL 106  CO2 26  BUN 25*  CREATININE 1.53*  CALCIUM 9.2  GLUCOSE 146*     Radiology/Studies: Dg Chest Port 1 View 01/05/2015   CLINICAL DATA:  Dizziness. Shortness of breath. Tachycardia. Cardiomyopathy.  EXAM: PORTABLE CHEST - 1 VIEW  COMPARISON:  02/23/2009  FINDINGS: Low lung volumes are noted as well as lordotic positioning. Both lungs are grossly clear. Heart size is within normal limits.  IMPRESSION: No acute findings.   Electronically Signed   By: Earle Gell M.D.   On: 01/05/2015 21:35    EKG:2:1 atrial flutter, ventricular rate 146, LBBB  TELEMETRY: atrial flutter, ventricular rate 140's  A/P  1. Atrial flutter The patient presents with recurrent symptomatic  typical appearing atrial flutter.  Though he has also had afib, this had been his predominant arrhythmia. He is appropriately anticoagulated with xarelto.  His chads2vasc score is 4.  At this time, I would favor cardioversion and discharge with further elective management determinted.  Risks of cardioversion were discussed with the patient (and also his spouse by phone) and he wishes to proceed.  ED team  to assist me with cardioversion followed by discharge.   Therapeutic strategies for atrial flutter including medicine and ablation were discussed in detail with the patient today. Risk, benefits, and alternatives to EP study and radiofrequency ablation were also discussed in detail today. These risks include but are not limited to stroke, bleeding, vascular damage, tamponade, perforation, damage to the heart and other structures, AV block requiring pacemaker, worsening renal function, and death. The patient understands these risk and wishes to proceed.  We will therefore have him return on Friday for elective atrial flutter ablation with anesthesia. A high level of decision making is required for this visit.  2. Nonischemic CM/ LBBB Repeat echo upon return next week  3. HTN Stable No change required today  [name] MD 01/16/2015 5:12 PM

## 2015-01-16 NOTE — ED Provider Notes (Addendum)
Procedural sedation Date/Time: 01/16/2015 5:19 PM Performed by: Charlesetta Shanks Authorized by: Charlesetta Shanks Consent: Verbal consent obtained. Written consent obtained. Risks and benefits: risks, benefits and alternatives were discussed Consent given by: patient Patient understanding: patient states understanding of the procedure being performed Patient consent: the patient's understanding of the procedure matches consent given Procedure consent: procedure consent matches procedure scheduled Relevant documents: relevant documents present and verified Required items: required blood products, implants, devices, and special equipment available Patient identity confirmed: verbally with patient and arm band Local anesthesia used: no Patient sedated: yes Sedation type: moderate (conscious) sedation Sedatives: etomidate Sedation start date/time: 01/16/2015 5:19 PM Sedation end date/time: 01/16/2015 5:42 PM Vitals: Vital signs were monitored during sedation. Patient tolerance: Patient tolerated the procedure well with no immediate complications Comments: Presedation history obtained for no allergic reaction to anesthesia. No known cervical or jaw malfunction. Patient's teeth are his own. He denies any significant history of respiratory illness. The patient has adequate range of motion at the neck and jaw. Mallampati 3 with his own dentition. Heart is tachycardic and regular. Lungs have good air flow with occasional fine expiratory wheeze.  Etomidate 12 mg used for sedation. Patient was cardioverted by Dr. Rayann Heman at bedside. Patient continued to be monitored post cardioversion. He did become diaphoretic however he did not develop any unstable vital signs. Blood pressures remain stable oxygen saturation saturations on room air remained from 92-98%. Upon awakening the patient was reassessed for awareness of person and place. He could identify his wife finding. He followed appropriate commands for grip  strength bilateral upper extremities. He followed commands for flexion and extension at the feet and elevating both legs off the bed. He denies complaints of chest pain or other acute complaints.    Charlesetta Shanks, MD 01/16/15 1746  Dr. Rayann Heman advises that the patient will be safe for discharge continuing on his current medications. He will see the patient in follow-up in his office and plan to definitively manage with ablation. The patient has recovered to baseline function without complaint of chest pain or any focal neurologic dysfunction on examination. His mental status is clear and appropriate. His vital signs are stable.  Charlesetta Shanks, MD 01/16/15 [id number]

## 2015-01-16 NOTE — Sedation Documentation (Signed)
Shock delivered for cardioversion 200J at 1725.

## 2015-01-16 NOTE — ED Notes (Signed)
Patient given crackers and peanut butter per his request.

## 2015-01-16 NOTE — ED Notes (Signed)
Error in clicking off.

## 2015-01-16 NOTE — ED Provider Notes (Addendum)
CSN: [id number]     Arrival date & time 01/16/15  1256 History   First MD Initiated Contact with Patient 01/16/15 1305     Chief Complaint  Patient presents with  . Atrial Fibrillation     (Consider location/radiation/quality/duration/timing/severity/associated sxs/prior Treatment) HPI Patient presents after recent hospitalization here, now with similar symptoms to those that brought him to the emergency department last week. Yesterday, approximately 14 hours ago he developed mild lightheadedness. There was no associated chest pain, nor dyspnea. Symptoms persisted, essentially until ED arrival, including during outpatient clinic visit. Patient was found to have atrial fibrillation with rapid ventricular response, sent here for evaluation. Since his recent hospital physician, he is taking Xarelto, has seen genetic counselor, has seen primary care. He was scheduled to follow-up with cardiology, but has not yet done so. No other medication changes, left changes, diet changes. No current complaints of fever, chills, nausea, vomiting, lightheadedness.  Past Medical History  Diagnosis Date  . Cardiomyopathy   . Personal history of kidney stones   . Cardiomyopathy   . CHF (congestive heart failure)   . Diabetes mellitus   . Hypercholesterolemia   . Hypertension   . Diverticulosis   . A-fib    Past Surgical History  Procedure Laterality Date  . Shoulder surgery  1981    left. Muscle reattachment  . Tonsillectomy    . Toe surgery Right 12/2012    2nd toe  . Colonoscopy N/A 12/29/2013    Procedure: COLONOSCOPY;  Surgeon: Lafayette Dragon, MD;  Location: [facility];  Service: Endoscopy;  Laterality: N/A;   Family History  Problem Relation Age of Onset  . Heart failure Maternal Grandmother   . Diabetes type II Sister 36  . Hyperlipidemia Other     Parent  . Stroke Other     Parent  . Hypertension Other     Parent  . Diabetes Other     Parent  . Colon cancer Neg Hx    History   Substance Use Topics  . Smoking status: Former Smoker    Types: Cigarettes  . Smokeless tobacco: Never Used  . Alcohol Use: No    Review of Systems  Constitutional:       Per HPI, otherwise negative  HENT:       Per HPI, otherwise negative  Respiratory:       Per HPI, otherwise negative  Cardiovascular:       Per HPI, otherwise negative  Gastrointestinal: Negative for vomiting.  Endocrine:       Negative aside from HPI  Genitourinary:       Neg aside from HPI   Musculoskeletal:       Per HPI, otherwise negative  Skin: Negative.   Neurological: Negative for syncope.      Allergies  Aspirin and Penicillins  Home Medications   Prior to Admission medications   Medication Sig Start Date End Date Taking? Authorizing Provider  Ascorbic Acid (VITAMIN C) 500 MG CAPS Take 1 capsule by mouth 2 (two) times daily.     Historical Provider, MD  Bilberry, Vaccinium myrtillus, (BILBERRY EXTRACT PO) Take 1,000 mg by mouth daily. 1.000mg  once a day    Historical Provider, MD  carvedilol (COREG) 25 MG tablet Take 1 tablet (25 mg total) by mouth 2 (two) times daily with a meal.    Larey Dresser, MD  CINNAMON PO Take 1 tablet by mouth every evening.     Historical Provider, MD  diphenhydramine-acetaminophen (TYLENOL PM)  25-500 MG TABS Take 2 tablets by mouth at bedtime as needed.     Historical Provider, MD  insulin NPH Human (HUMULIN N) 100 UNIT/ML injection 85 units each morning, and 50 units each evening 01/15/15   Renato Shin, MD  Multiple Vitamin (MULTIVITAMIN) tablet Take 1 tablet by mouth daily.      Historical Provider, MD  rivaroxaban (XARELTO) 20 MG TABS tablet Take 1 tablet (20 mg total) by mouth daily with supper. 01/07/15   Brittainy Erie Noe, PA-C  simvastatin (ZOCOR) 40 MG tablet Take 40 mg by mouth at bedtime.      Historical Provider, MD  TRILIPIX 135 MG capsule Take 1 tablet by mouth daily. 03/14/12   Historical Provider, MD   BP 113/55 mmHg  Pulse 145  Temp(Src) 97.8  F (36.6 C) (Oral)  Resp 20  Ht 5\' 11"  (1.803 m)  Wt 259 lb 12.8 oz (117.845 kg)  BMI 36.25 kg/m2  SpO2 97% Physical Exam  Constitutional: He is oriented to person, place, and time. He appears well-developed. No distress.  HENT:  Head: Normocephalic and atraumatic.  Eyes: Conjunctivae and EOM are normal.  Cardiovascular: An irregularly irregular rhythm present. Tachycardia present.   Pulmonary/Chest: Effort normal. No stridor. No respiratory distress.  Abdominal: He exhibits no distension.  Musculoskeletal: He exhibits no edema.  Neurological: He is alert and oriented to person, place, and time.  Skin: Skin is warm and dry.  Psychiatric: He has a normal mood and affect.  Nursing note and vitals reviewed.   ED Course  Procedures (including critical care time) Labs Review Labs Reviewed  BASIC METABOLIC PANEL  CBC  BRAIN NATRIURETIC PEPTIDE  TROPONIN I  Randolm Idol, ED      EKG Interpretation   Date/Time:  Tuesday January 16 2015 13:00:42 EST Ventricular Rate:  147 PR Interval:    QRS Duration: 160 QT Interval:  362 QTC Calculation: 566 R Axis:   50 Text Interpretation:  Wide QRS tachycardia Left bundle branch block  Abnormal ECG Wide QRS tachycardia Non-specific intra-ventricular  conduction delay Abnormal ekg Confirmed by Carmin Muskrat  MD [phone]) on  01/16/2015 1:31:51 PM     On monitor rate is 140s, abnormal Pulse oxygen 100% room air normal  I reviewed the patient's chart, including recent emergency department evaluation, cardiology evaluation and admission with conversion from arrhythmia to sinus rhythm following initiation of amiodarone.    After the initial evaluation the patient received initiation of amiodarone drip, cardiology consult.   2:25 PM Patient in no distress. HR remains in 140's  2:59 PM Patient in similar condition, heart rate remains 140 MDM   Patient presents with generalized fatigue, but is found to be atrial  fibrillation, rapid ventricular response. Patient has a history of atrial fibrillation, including recent hospitalization for similar episode. Here, the patient required initiation of amiodarone drip, which he has previously for cardioversion. After initiation of amiodarone drip, I discussed patient's case with our cardiology colleagues, to assisted with admission for additional management, treatment.   CRITICAL CARE Performed by: Carmin Muskrat Total critical care time: 35 Critical care time was exclusive of separately billable procedures and treating other patients. Critical care was necessary to treat or prevent imminent or life-threatening deterioration. Critical care was time spent personally by me on the following activities: development of treatment plan with patient and/or surrogate as well as nursing, discussions with consultants, evaluation of patient's response to treatment, examination of patient, obtaining history from patient or surrogate, ordering and performing treatments  and interventions, ordering and review of laboratory studies, ordering and review of radiographic studies, pulse oximetry and re-evaluation of patient's condition.   Carmin Muskrat, MD 01/16/15 1500  4:58 PM Patient remains in atrial flutter, blood pressure slightly lower. After discussion with our cardiology team patient will have cardioversion performed now. Care endorsed to colleagues for cardioversion, monitoring.  Carmin Muskrat, MD 01/16/15 [phone]

## 2015-01-18 MED ORDER — SODIUM CHLORIDE 0.9 % IV SOLN
INTRAVENOUS | Status: DC
Start: 2015-01-18 — End: 2015-01-19
  Administered 2015-01-19: 11:00:00 via INTRAVENOUS

## 2015-01-19 ENCOUNTER — Ambulatory Visit (HOSPITAL_COMMUNITY): Payer: Medicare Other | Admitting: Anesthesiology

## 2015-01-19 ENCOUNTER — Encounter (HOSPITAL_COMMUNITY)
Admission: RE | Disposition: A | Discharge status: Home / Self Care | Payer: Self-pay | Source: Ambulatory Visit | Attending: Internal Medicine

## 2015-01-19 ENCOUNTER — Encounter (HOSPITAL_COMMUNITY): Payer: Self-pay | Admitting: Anesthesiology

## 2015-01-19 ENCOUNTER — Ambulatory Visit (HOSPITAL_COMMUNITY)
Admission: RE | Admit: 2015-01-19 | Discharge: 2015-01-19 | Disposition: A | Discharge status: Home / Self Care | Payer: Medicare Other | Source: Ambulatory Visit | Attending: Internal Medicine | Admitting: Internal Medicine

## 2015-01-19 DIAGNOSIS — I429 Cardiomyopathy, unspecified: Secondary | ICD-10-CM | POA: Diagnosis not present

## 2015-01-19 DIAGNOSIS — Z794 Long term (current) use of insulin: Secondary | ICD-10-CM | POA: Insufficient documentation

## 2015-01-19 DIAGNOSIS — Z7901 Long term (current) use of anticoagulants: Secondary | ICD-10-CM | POA: Diagnosis not present

## 2015-01-19 DIAGNOSIS — E119 Type 2 diabetes mellitus without complications: Secondary | ICD-10-CM | POA: Insufficient documentation

## 2015-01-19 DIAGNOSIS — I483 Typical atrial flutter: Secondary | ICD-10-CM

## 2015-01-19 DIAGNOSIS — I447 Left bundle-branch block, unspecified: Secondary | ICD-10-CM | POA: Diagnosis not present

## 2015-01-19 DIAGNOSIS — I4892 Unspecified atrial flutter: Secondary | ICD-10-CM | POA: Diagnosis not present

## 2015-01-19 DIAGNOSIS — E780 Pure hypercholesterolemia: Secondary | ICD-10-CM | POA: Diagnosis not present

## 2015-01-19 DIAGNOSIS — Z87891 Personal history of nicotine dependence: Secondary | ICD-10-CM | POA: Diagnosis not present

## 2015-01-19 DIAGNOSIS — E669 Obesity, unspecified: Secondary | ICD-10-CM | POA: Diagnosis not present

## 2015-01-19 DIAGNOSIS — I509 Heart failure, unspecified: Secondary | ICD-10-CM | POA: Diagnosis not present

## 2015-01-19 DIAGNOSIS — I428 Other cardiomyopathies: Secondary | ICD-10-CM

## 2015-01-19 DIAGNOSIS — I10 Essential (primary) hypertension: Secondary | ICD-10-CM | POA: Diagnosis not present

## 2015-01-19 DIAGNOSIS — I5022 Chronic systolic (congestive) heart failure: Secondary | ICD-10-CM | POA: Insufficient documentation

## 2015-01-19 HISTORY — PX: ATRIAL FLUTTER ABLATION: SHX5733

## 2015-01-19 LAB — GLUCOSE, CAPILLARY
GLUCOSE-CAPILLARY: 96 mg/dL (ref 70–99)
GLUCOSE-CAPILLARY: 173 mg/dL — AB (ref 70–99)
Glucose-Capillary: 104 mg/dL — ABNORMAL HIGH (ref 70–99)
Glucose-Capillary: 89 mg/dL (ref 70–99)

## 2015-01-19 SURGERY — ATRIAL FLUTTER ABLATION
Anesthesia: General

## 2015-01-19 MED ORDER — SODIUM CHLORIDE 0.9 % IJ SOLN: 3.0000 mL | INTRAMUSCULAR | Status: DC | PRN

## 2015-01-19 MED ORDER — MIDAZOLAM HCL 5 MG/5ML IJ SOLN
INTRAMUSCULAR | Status: DC | PRN
  Administered 2015-01-19 (×2): 0.5 mg via INTRAVENOUS

## 2015-01-19 MED ORDER — HYDROCODONE-ACETAMINOPHEN 5-325 MG PO TABS: 1.0000 | ORAL_TABLET | ORAL | Status: DC | PRN

## 2015-01-19 MED ORDER — ONDANSETRON HCL 4 MG/2ML IJ SOLN: 4.0000 mg | Freq: Four times a day (QID) | INTRAMUSCULAR | Status: DC | PRN

## 2015-01-19 MED ORDER — PROPOFOL 10 MG/ML IV BOLUS
INTRAVENOUS | Status: DC | PRN
  Administered 2015-01-19: 130 mg via INTRAVENOUS

## 2015-01-19 MED ORDER — BUPIVACAINE HCL (PF) 0.25 % IJ SOLN
INTRAMUSCULAR | Status: AC
  Filled 2015-01-19: qty 30

## 2015-01-19 MED ORDER — ONDANSETRON HCL 4 MG/2ML IJ SOLN
INTRAMUSCULAR | Status: DC | PRN
  Administered 2015-01-19: 4 mg via INTRAVENOUS

## 2015-01-19 MED ORDER — SODIUM CHLORIDE 0.9 % IV SOLN: 250.0000 mL | INTRAVENOUS | Status: DC | PRN

## 2015-01-19 MED ORDER — SODIUM CHLORIDE 0.9 % IJ SOLN
3.0000 mL | Freq: Two times a day (BID) | INTRAMUSCULAR | Status: DC
  Administered 2015-01-19: 3 mL via INTRAVENOUS

## 2015-01-19 MED ORDER — DOBUTAMINE IN D5W 4-5 MG/ML-% IV SOLN
INTRAVENOUS | Status: DC | PRN
Start: 2015-01-19 — End: 2015-01-19
  Administered 2015-01-19: 10 ug/kg/min via INTRAVENOUS

## 2015-01-19 MED ORDER — PHENYLEPHRINE HCL 10 MG/ML IJ SOLN
10.0000 mg | INTRAVENOUS | Status: DC | PRN
  Administered 2015-01-19: 20 ug/min via INTRAVENOUS

## 2015-01-19 MED ORDER — HEPARIN (PORCINE) IN NACL 2-0.9 UNIT/ML-% IJ SOLN
INTRAMUSCULAR | Status: AC
  Filled 2015-01-19: qty 500

## 2015-01-19 MED ORDER — ACETAMINOPHEN 325 MG PO TABS: 650.0000 mg | ORAL_TABLET | ORAL | Status: DC | PRN

## 2015-01-19 MED ORDER — RIVAROXABAN 20 MG PO TABS
20.0000 mg | ORAL_TABLET | Freq: Every day | ORAL | Status: DC
  Administered 2015-01-19: 20 mg via ORAL
  Filled 2015-01-19: qty 1

## 2015-01-19 MED ORDER — DOBUTAMINE IN D5W 4-5 MG/ML-% IV SOLN
INTRAVENOUS | Status: AC
  Filled 2015-01-19: qty 250

## 2015-01-19 MED ORDER — FENTANYL CITRATE 0.05 MG/ML IJ SOLN
INTRAMUSCULAR | Status: DC | PRN
  Administered 2015-01-19 (×2): 25 ug via INTRAVENOUS

## 2015-01-19 NOTE — Interval H&P Note (Signed)
History and Physical Interval Note:  01/19/2015 10:34 AM  Justin Hill  has presented today for surgery, with the diagnosis of aflutter  The various methods of treatment have been discussed with the patient and family. After consideration of risks, benefits and other options for treatment, the patient has consented to  Procedure(s): ATRIAL FLUTTER ABLATION (N/A) as a surgical intervention .  The patient's history has been reviewed, patient examined, no change in status, stable for surgery.  I have reviewed the patient's chart and labs.  Questions were answered to the patient's satisfaction.     [name]

## 2015-01-19 NOTE — Discharge Summary (Signed)
Physician Discharge Summary  Patient ID: Justin Hill MRN: [id number] DOB/AGE: 72-Apr-1942 72 y.o.   Primary Cardiologist: Dr. Aundra Dubin Electrophysiologist: Dr. Rayann Heman  Admit date: 01/19/2015 Discharge date: 01/19/2015  Admission Diagnoses: Atrial Flutter  Discharge Diagnoses:  Active Problems:   Nonischemic cardiomyopathy   Congestive heart failure   Left bundle branch block   Atrial flutter with rapid ventricular response   Atrial flutter   Discharged Condition: stable  Hospital Course: 74 y.o. male with symptomatic typical appearing atrial flutter who presented to [facility] today for EP study and radiofrequency ablation. The procedure was performed by Dr. Rayann Heman. Cavotricuspid isthmus ablation was performed with complete bidirectional isthmus block achieved. No inducible arrhythmias following ablation both on and off of dobutamine. No evidence of accessory pathways, or dual av nodal physiology. No inducible bundle branch reentry or VT post ablation on dobutamine. No early apparent complications. He was was placed on bedrest and monitored. He had no complications and no difficulties ambulating. Vital signs remained stable. Dr. Rayann Heman agreed that he could be discharged. No medication changes were made. F/u arranged with Dr. Rayann Heman 02/19/15.  Consults: None  Significant Diagnostic Studies/ Treatments:  EP Study + Atrial Flutter Ablation 01/19/15  Initial Measurements: The patient presented to the electrophysiology lab in sinus rhythm with a LBBB. With catheter positioning along the His region, he developed transient complete heart block which quickly converted to 2:1 AV block and then 1:1 conduction. Once his conduction returned, his PR interval measured 162 msec with a QRS duration of 164 Msec (LBBB) and a QT interval 419 msec. His RR interval measured 775 msec. His AH interval measured 148 msec with an HV interval of 76 msec.   Pacing Maneuvers: Ventricular pacing was performed  which revealed initially VA dissociation. Rapid atrial pacing was performed which revealed an AV Wenckebach cycle length of 500 msec. There was no evidence of PR greater than RR. Rapid atrial pacing was continued down to a cycle length of 230 msec with atrial flutter induced. There was proximal to distal CS activation with an atrial flutter cycle length of 260 msec. This was therefore felt to represent right atrial flutter. The surface EKG revealed typical atrial flutter which was his clinical arrhythmia. Entrainment mapping was attempted with pacing from the left atrium, however atrial flutter terminated terminated with pacing. Multiple attempts to re- induce atrial flutter were made, however, this was unsuccessful. I elected to perform cavotricuspid isthmus ablation.  Ablation: A 7-French [employer] II XP 10-mm ablation catheter was introduced through the right common femoral vein and advanced into the right atrium. Atrial mapping along the cavotricuspid isthmus was performed. This demonstrated a standard CavoTricuspid isthmus. A series of 11 radiofrequency applications were delivered along the cavotricuspid isthmus with a target temperature of 60 degrees at 70 watts for 120 seconds each. A duo-decapolar circular mapping catheter was introduced through the right common femoral vein and advanced into the right atrium. This Halo catheter was then positioned around the cavotricuspid isthmus. Differential atrial pacing was again performed which revealed complete bidirectional isthmus block. The patient was observed for 20 minutes without return of conduction through the isthmus.   Measurements Post ablation: Following ablation, the AH interval measured 79 msec with an HV interval of 90 msec. The AV Wenckebach cycle length was 320 msec. Rapid atrial pacing was again performed down to a cycle length of 200 msec without any arrhythmias observed today. Dobutamine was infused at 10 mcg/kg/min.  During dobutamine additional pacing was performed. Ventricular  pacing revealed midline/ concentric VA conduction with a VA wenckebach cycle length of 450 msec. VEST was performed which revealed no retrograde jumps, echo beats, or tachycardias. The Retrograde VA ERP was 500/380 msec.  Atrial pacing revealed no evidence of PR> RR and no arrhythmias induced when pacing down to a CL of 200 msec. The AV WCL was 320 msec. AEST revealed decremental AV conduction with no AH jumps, echo beats, or tachycardias. The atrial ERP was 500/240 msec.  Programmed stim was then performed from the RV apex during dobutamine infusion down to refractoriness (500/300/260/220 msec) with no inducible VT or bundle branch reentry. The procedure was therefore considered completed. All catheters were removed and the sheaths were aspirated and flushed. The sheaths were removed and hemostasis was assured. EBL<59ml. There were no early apparent complications.   Discharge Exam: Blood pressure 138/78, pulse 79, temperature 97.7 F (36.5 C), temperature source Oral, resp. rate 15, height 5\' 11"  (1.803 m), weight 263 lb 6.4 oz (119.477 kg), SpO2 96 %.   Disposition: 01-Home or Self Care      Discharge Instructions    Diet - low sodium heart healthy    Complete by:  As directed      Increase activity slowly    Complete by:  As directed             Medication List    TAKE these medications        BILBERRY EXTRACT PO  Take 1,000 mg by mouth daily. 1.000mg  once a day     carvedilol 25 MG tablet  Commonly known as:  COREG  Take 1 tablet (25 mg total) by mouth 2 (two) times daily with a meal.     CINNAMON PO  Take 1 tablet by mouth every evening.     diphenhydramine-acetaminophen 25-500 MG Tabs  Commonly known as:  TYLENOL PM  Take 2 tablets by mouth at bedtime as needed.     guaiFENesin 600 MG 12 hr tablet  Commonly known as:  MUCINEX  Take 600 mg by mouth 2 (two) times daily as needed (runny nose).      insulin NPH Human 100 UNIT/ML injection  Commonly known as:  HUMULIN N  85 units each morning, and 50 units each evening     multivitamin tablet  Take 1 tablet by mouth daily.     rivaroxaban 20 MG Tabs tablet  Commonly known as:  XARELTO  Take 1 tablet (20 mg total) by mouth daily with supper.     simvastatin 40 MG tablet  Commonly known as:  ZOCOR  Take 40 mg by mouth at bedtime.     TRILIPIX 135 MG capsule  Generic drug:  Choline Fenofibrate  Take 1 tablet by mouth daily.     Vitamin C 500 MG Caps  Take 1 capsule by mouth 2 (two) times daily.       Follow-up Information    Follow up with [name], MD On 02/19/2015.   Specialty:  Cardiology   Why:  10:30 am   Contact information:   [address] [place] [address] [phone]       TIME SPENT ON DISCHARGE INCLUDING PHYSICIAN TIME: > 30 MINTUES  Signed: Lyda Jester 01/19/2015, 5:49 PM   Doing well s/p ablation Vitals are stable No arrhythmias on telemetry Exam unchanged  Follow-up with me in 4 weeks [name] MD

## 2015-01-19 NOTE — Progress Notes (Signed)
Pt discharged home with wife. Discharge education reviewed with pt and family. Groin a level 0. V/S stable.

## 2015-01-19 NOTE — H&P (View-Only) (Signed)
ELECTROPHYSIOLOGY CONSULT NOTE    Patient ID: Justin Hill MRN: [id number], DOB/AGE: [birth year] 74 y.o.  Admit date: 01/16/2015 Date of Consult: 01/16/2015  Primary Physician: Cyndy Freeze, MD  Primary Cardiologist: Aundra Dubin Electrophysiologist: Lauralyn Shadowens Referring Physician: Julianne Handler  Reason for Consultation: atrial flutter  HPI:  Justin Hill is a 74 y.o. male with h/o nonischemic CM, chronic systolic dysfunction, LBBB, diabetes, HTN, obesity, and wide complex tachycardia. The patient is felt to have chronically NYHA Class I symptoms. He was planned to have a CPX test but has declined this previously.  He reports that he is very active. He does his own yard work. He does however have to sit down and rest when he gets home because he is "tired".   He was brought to [facility] earlier this month where he was found to have a relatively regular wide complex tachycardia with QRS similar to his chronic LBBB. He was admitted and started on heparin gtt and amiodarone. He did well overnight and spontaneously converted to sinus rhythm. His symptoms have completely resolved and he is now back at baseline. He did not have palpitations with the episode and does not think that he has had afib or tachycardias in the past. He did not have syncope with the episode. His wife does not think that he snores.    He has been screened for genetic AF, but his lab work is not resulted yet.    Last night around 8PM, he again developed palpitations associated with dizziness. He went home and this morning felt improved.  He then went to a previously scheduled appt with his PCP and was found to be tachycardic and was advised to come to the ER for evaluation.   He denies chest pain, shortness of breath, syncope, recent fevers, chills, nausea or vomiting.  He reports compliance with Xarelto and has not eaten today. ROS is otherwise negative except as outlined above.   Past Medical  History  Diagnosis Date  . Cardiomyopathy   . Personal history of kidney stones   . Cardiomyopathy   . CHF (congestive heart failure)   . Diabetes mellitus   . Hypercholesterolemia   . Hypertension   . Diverticulosis   . A-fib      Surgical History:  Past Surgical History  Procedure Laterality Date  . Shoulder surgery  1981    left. Muscle reattachment  . Tonsillectomy    . Toe surgery Right 12/2012    2nd toe  . Colonoscopy N/A 12/29/2013    Procedure: COLONOSCOPY;  Surgeon: Lafayette Dragon, MD;  Location: [facility];  Service: Endoscopy;  Laterality: N/A;      Inpatient Medications:  . carvedilol  25 mg Oral BID WC  . [START ON 01/17/2015] fenofibrate  54 mg Oral Daily  . insulin aspart  0-5 Units Subcutaneous QHS  . insulin aspart  0-9 Units Subcutaneous TID WC  . insulin NPH Human  50 Units Subcutaneous QHS  . [START ON 01/17/2015] insulin NPH Human  85 Units Subcutaneous QAC breakfast  . [START ON 01/17/2015] multivitamin with minerals  1 tablet Oral Daily  . [START ON 01/17/2015] rivaroxaban  20 mg Oral Q supper  . sodium chloride  3 mL Intravenous Q12H  . vitamin C  500 mg Oral BID    Allergies:  Allergies  Allergen Reactions  . Aspirin Other (See Comments)    Burps, burning, stomach pains, etc  . Penicillins Other (See Comments)    syncope  History   Social History  . Marital Status: Married    Spouse Name: Rosemarie Beath  . Number of Children: 1  . Years of Education: 12   Occupational History  . retired    Social History Main Topics  . Smoking status: Former Smoker    Types: Cigarettes  . Smokeless tobacco: Never Used  . Alcohol Use: No  . Drug Use: No  . Sexual Activity: Not on file   Other Topics Concern  . Not on file   Social History Narrative   Regular exercise-no           Family History  Problem Relation Age of Onset  . Heart failure Maternal Grandmother   . Diabetes type II Sister 58  . Hyperlipidemia Other     Parent  . Stroke  Other     Parent  . Hypertension Other     Parent  . Diabetes Other     Parent  . Colon cancer Neg Hx     BP 94/60 mmHg  Pulse 142  Temp(Src) 97.8 F (36.6 C) (Oral)  Resp 15  Ht 5\' 11"  (1.803 m)  Wt 259 lb 12.8 oz (117.845 kg)  BMI 36.25 kg/m2  SpO2 95%  Physical Exam: Filed Vitals:   01/16/15 1500 01/16/15 1530 01/16/15 1600 01/16/15 1630  BP: 105/58 111/75 99/71 94/60   Pulse: 141 139 139 142  Temp:      TempSrc:      Resp: 16 17 18 15   Height:      Weight:      SpO2: 95% 96% 95% 95%    GEN- The patient is well appearing, alert and oriented x 3 today.   Head- normocephalic, atraumatic Eyes-  Sclera clear, conjunctiva pink Ears- hearing intact Oropharynx- clear Neck- supple, Lungs- Clear to ausculation bilaterally, normal work of breathing Heart- irregular rate and rhythm GI- soft, NT, ND, + BS Extremities- no clubbing, cyanosis, or edema  MS- no significant deformity or atrophy Skin- no rash or lesion Psych- euthymic mood, full affect Neuro- strength and sensation are intact    Labs:   Lab Results  Component Value Date   WBC 8.1 01/16/2015   HGB 16.2 01/16/2015   HCT 44.9 01/16/2015   MCV 84.2 01/16/2015   PLT 151 01/16/2015    Recent Labs Lab 01/16/15 1308  NA 139  K 4.5  CL 106  CO2 26  BUN 25*  CREATININE 1.53*  CALCIUM 9.2  GLUCOSE 146*     Radiology/Studies: Dg Chest Port 1 View 01/05/2015   CLINICAL DATA:  Dizziness. Shortness of breath. Tachycardia. Cardiomyopathy.  EXAM: PORTABLE CHEST - 1 VIEW  COMPARISON:  02/23/2009  FINDINGS: Low lung volumes are noted as well as lordotic positioning. Both lungs are grossly clear. Heart size is within normal limits.  IMPRESSION: No acute findings.   Electronically Signed   By: Earle Gell M.D.   On: 01/05/2015 21:35    EKG:2:1 atrial flutter, ventricular rate 146, LBBB  TELEMETRY: atrial flutter, ventricular rate 140's  A/P  1. Atrial flutter The patient presents with recurrent symptomatic  typical appearing atrial flutter.  Though he has also had afib, this had been his predominant arrhythmia. He is appropriately anticoagulated with xarelto.  His chads2vasc score is 4.  At this time, I would favor cardioversion and discharge with further elective management determinted.  Risks of cardioversion were discussed with the patient (and also his spouse by phone) and he wishes to proceed.  ED team  to assist me with cardioversion followed by discharge.   Therapeutic strategies for atrial flutter including medicine and ablation were discussed in detail with the patient today. Risk, benefits, and alternatives to EP study and radiofrequency ablation were also discussed in detail today. These risks include but are not limited to stroke, bleeding, vascular damage, tamponade, perforation, damage to the heart and other structures, AV block requiring pacemaker, worsening renal function, and death. The patient understands these risk and wishes to proceed.  We will therefore have him return on Friday for elective atrial flutter ablation with anesthesia. A high level of decision making is required for this visit.  2. Nonischemic CM/ LBBB Repeat echo upon return next week  3. HTN Stable No change required today  [name] MD 01/16/2015 5:12 PM

## 2015-01-19 NOTE — Progress Notes (Signed)
UR Completed Brysan Mcevoy Graves-Bigelow, RN,BSN [phone]  

## 2015-01-19 NOTE — Anesthesia Preprocedure Evaluation (Addendum)
Anesthesia Evaluation  Patient identified by MRN, date of birth, ID band Patient awake    Reviewed: Allergy & Precautions, NPO status , Patient's Chart, lab work & pertinent test results  History of Anesthesia Complications Negative for: history of anesthetic complications  Airway Mallampati: IV  TM Distance: >3 FB Neck ROM: Limited    Dental  (+) Teeth Intact   Pulmonary neg shortness of breath, neg sleep apnea, neg COPDneg recent URI, former smoker,  breath sounds clear to auscultation        Cardiovascular hypertension, Pt. on medications and Pt. on home beta blockers + CAD and +CHF + dysrhythmias Atrial Fibrillation Rhythm:Regular     Neuro/Psych negative neurological ROS  negative psych ROS   GI/Hepatic   Endo/Other  diabetes, Well Controlled, Type 2, Insulin DependentMorbid obesity  Renal/GU CRFRenal disease     Musculoskeletal   Abdominal   Peds  Hematology negative hematology ROS (+)   Anesthesia Other Findings Pt has limited neck extension.  Full beard.  Lots of soft tissue  Reproductive/Obstetrics                            Anesthesia Physical Anesthesia Plan  ASA: III  Anesthesia Plan: General   Post-op Pain Management:    Induction: Intravenous  Airway Management Planned: LMA  Additional Equipment: None  Intra-op Plan:   Post-operative Plan: Extubation in OR  Informed Consent: I have reviewed the patients History and Physical, chart, labs and discussed the procedure including the risks, benefits and alternatives for the proposed anesthesia with the patient or authorized representative who has indicated his/her understanding and acceptance.   Dental advisory given  Plan Discussed with: CRNA and Surgeon  Anesthesia Plan Comments:         Anesthesia Quick Evaluation

## 2015-01-19 NOTE — Progress Notes (Signed)
Site area: RFA x 3 Site Prior to Removal:  Level  0 Pressure Applied For:42min Manual:  yes  Patient Status During Pull:  stable Post Pull Site:  Level 0 Post Pull Instructions Given: yes  Post Pull Pulses Present: palpable Dressing Applied:  clear Bedrest begins @ [id number] Comments: removed by Garald Braver

## 2015-01-19 NOTE — Anesthesia Procedure Notes (Signed)
Procedure Name: LMA Insertion Date/Time: 01/19/2015 11:21 AM Performed by: Williemae Area B Pre-anesthesia Checklist: Patient identified, Emergency Drugs available, Suction available and Patient being monitored Patient Re-evaluated:Patient Re-evaluated prior to inductionOxygen Delivery Method: Circle system utilized Preoxygenation: Pre-oxygenation with 100% oxygen Intubation Type: IV induction Ventilation: Two handed mask ventilation required LMA: LMA inserted LMA Size: 5.0 Number of attempts: 1 ETT to lip (cm): taped across cheeks; gauze roll b/t teeth. Tube secured with: Tape Dental Injury: Teeth and Oropharynx as per pre-operative assessment  Comments: Higinio Plan made mask AW imperfect

## 2015-01-19 NOTE — Discharge Instructions (Addendum)
Cardiac Ablation  Cardiac ablation is a procedure to stop some heart tissue from causing problems. The heart has many electrical connections. Sometimes these connections cause the heart to beat very fast or irregularly. Removing some of the problem areas can improve heart rhythm or make it normal. Ablation is done for people who:   Have Wolff-Parkinson-White syndrome.  Have other fast heart rhythms (tachycardia).  Have taken medicines for an abnormal heart rhythm (arrhythmia) and the medicines had:  No success.  Side effects.  May have a type of heartbeat that could cause death. BEFORE THE PROCEDURE   Follow instructions from your doctor about eating and drinking before the procedure.  Take your medicines as told by your doctor. Take them at regular times with water unless told differently by your doctor.  If you are taking diabetes medicine, ask your doctor how to take it. Ask if there are any special instructions you should follow. Your doctor may change how much insulin you take the day of the procedure. PROCEDURE   A special type of X-ray will be used. The X-ray helps your doctor see images of your heart during the procedure.  A small cut (incision) will be made in your neck or groin.  An IV tube will be started before the procedure begins.  You will be given a numbing medicine (anesthetic) or a medicine to help you relax (sedative).  The skin on your neck or groin will be numbed.  A needle will be put into a large vein in your neck or groin.  A thin, flexible tube (catheter) will be put in to reach your heart.  A dye will be put in the tube. The dye will show up on X-rays. It will help your doctor see the area of the heart that needs treatment.  When the heart tissue that is causing problems is found, the tip of the tube will send an electrical current to it. This will stop it from causing problems.  The tube will be taken out.  Pressure will be put on the area where  the tube was. This will keep it from bleeding. A bandage will be placed over the area. AFTER THE PROCEDURE  You will be taken to a recovery area. Your blood pressure, heart rate, and breathing will be watched. The area where the tube was will also be watched for bleeding.  You will need to lie still for 4-6 hours. This keeps the area where the tube was from bleeding. Document Released: 07/13/2013 Document Revised: 03/27/2014 Document Reviewed: 07/13/2013 [facility] Patient Information 2015 [place], [place]. This information is not intended to replace advice given to you by your health care provider. Make sure you discuss any questions you have with your health care provider.  Low-Sodium Eating Plan Sodium raises blood pressure and causes water to be held in the body. Getting less sodium from food will help lower your blood pressure, reduce any swelling, and protect your heart, liver, and kidneys. We get sodium by adding salt (sodium chloride) to food. Most of our sodium comes from canned, boxed, and frozen foods. Restaurant foods, fast foods, and pizza are also very high in sodium. Even if you take medicine to lower your blood pressure or to reduce fluid in your body, getting less sodium from your food is important. WHAT IS MY PLAN? Most people should limit their sodium intake to 2,300 mg a day. Your health care provider recommends that you limit your sodium intake to __________ a day.  WHAT  DO I NEED TO KNOW ABOUT THIS EATING PLAN? For the low-sodium eating plan, you will follow these general guidelines:  Choose foods with a % Daily Value for sodium of less than 5% (as listed on the food label).   Use salt-free seasonings or herbs instead of table salt or sea salt.   Check with your health care provider or pharmacist before using salt substitutes.   Eat fresh foods.  Eat more vegetables and fruits.  Limit canned vegetables. If you do use them, rinse them well to decrease the sodium.    Limit cheese to 1 oz (28 g) per day.   Eat lower-sodium products, often labeled as "lower sodium" or "no salt added."  Avoid foods that contain monosodium glutamate (MSG). MSG is sometimes added to [place] food and some canned foods.  Check food labels (Nutrition Facts labels) on foods to learn how much sodium is in one serving.  Eat more home-cooked food and less restaurant, buffet, and fast food.  When eating at a restaurant, ask that your food be prepared with less salt or none, if possible.  HOW DO I READ FOOD LABELS FOR SODIUM INFORMATION? The Nutrition Facts label lists the amount of sodium in one serving of the food. If you eat more than one serving, you must multiply the listed amount of sodium by the number of servings. Food labels may also identify foods as:  Sodium free--Less than 5 mg in a serving.  Very low sodium--35 mg or less in a serving.  Low sodium--140 mg or less in a serving.  Light in sodium--50% less sodium in a serving. For example, if a food that usually has 300 mg of sodium is changed to become light in sodium, it will have 150 mg of sodium.  Reduced sodium--25% less sodium in a serving. For example, if a food that usually has 400 mg of sodium is changed to reduced sodium, it will have 300 mg of sodium. WHAT FOODS CAN I EAT? Grains Low-sodium cereals, including oats, puffed wheat and rice, and shredded wheat cereals. Low-sodium crackers. Unsalted rice and pasta. Lower-sodium bread.  Vegetables Frozen or fresh vegetables. Low-sodium or reduced-sodium canned vegetables. Low-sodium or reduced-sodium tomato sauce and paste. Low-sodium or reduced-sodium tomato and vegetable juices.  Fruits Fresh, frozen, and canned fruit. Fruit juice.  Meat and Other Protein Products Low-sodium canned tuna and salmon. Fresh or frozen meat, poultry, seafood, and fish. Lamb. Unsalted nuts. Dried beans, peas, and lentils without added salt. Unsalted canned beans.  Homemade soups without salt. Eggs.  Dairy Milk. Soy milk. Ricotta cheese. Low-sodium or reduced-sodium cheeses. Yogurt.  Condiments Fresh and dried herbs and spices. Salt-free seasonings. Onion and garlic powders. Low-sodium varieties of mustard and ketchup. Lemon juice.  Fats and Oils Reduced-sodium salad dressings. Unsalted butter.  Other Unsalted popcorn and pretzels.  The items listed above may not be a complete list of recommended foods or beverages. Contact your dietitian for more options. WHAT FOODS ARE NOT RECOMMENDED? Grains Instant hot cereals. Bread stuffing, pancake, and biscuit mixes. Croutons. Seasoned rice or pasta mixes. Noodle soup cups. Boxed or frozen macaroni and cheese. Self-rising flour. Regular salted crackers. Vegetables Regular canned vegetables. Regular canned tomato sauce and paste. Regular tomato and vegetable juices. Frozen vegetables in sauces. Salted french fries. Olives. Angie Fava. Relishes. Sauerkraut. Salsa. Meat and Other Protein Products Salted, canned, smoked, spiced, or pickled meats, seafood, or fish. Bacon, ham, sausage, hot dogs, corned beef, chipped beef, and packaged luncheon meats. Salt pork. Jerky. Pickled  herring. Anchovies, regular canned tuna, and sardines. Salted nuts. Dairy Processed cheese and cheese spreads. Cheese curds. Blue cheese and cottage cheese. Buttermilk.  Condiments Onion and garlic salt, seasoned salt, table salt, and sea salt. Canned and packaged gravies. Worcestershire sauce. Tartar sauce. Barbecue sauce. Teriyaki sauce. Soy sauce, including reduced sodium. Steak sauce. Fish sauce. Oyster sauce. Cocktail sauce. Horseradish. Regular ketchup and mustard. Meat flavorings and tenderizers. Bouillon cubes. Hot sauce. Tabasco sauce. Marinades. Taco seasonings. Relishes. Fats and Oils Regular salad dressings. Salted butter. Margarine. Ghee. Bacon fat.  Other Potato and tortilla chips. Corn chips and puffs. Salted popcorn  and pretzels. Canned or dried soups. Pizza. Frozen entrees and pot pies.  The items listed above may not be a complete list of foods and beverages to avoid. Contact your dietitian for more information. Document Released: 05/02/2002 Document Revised: 11/15/2013 Document Reviewed: 09/14/2013 [facility] Patient Information 2015 [place], [place]. This information is not intended to replace advice given to you by your health care provider. Make sure you discuss any questions you have with your health care provider.  Cardiac Diet A cardiac diet can help stop heart disease or a stroke from happening. It involves eating less unhealthy fats and eating more healthy fats.  FOODS TO AVOID OR LIMIT  Limit saturated fats. This type of fat is found in oils and dairy products, such as:  Coconut oil.  Palm oil.  Cocoa butter.  Butter.  Avoid trans-fat or hydrogenated oils. These are found in fried or pre-made baked goods, such as:  Margarine.  Pre-made cookies, cakes, and crackers.  Limit processed meats (hot dogs, deli meats, sausage) to 3 ounces a week.  Limit high-fat meats (marbled meats, fried chicken, or chicken with skin) to 3 ounces a week.  Limit salt (sodium) to 1500 milligrams a day.   Limit sweets and drinks with added sugar to no more than 5 servings a week. One serving is:  1 tablespoon of sugar.  1 tablespoon of jelly or jam.   cup sorbet.  1 cup lemonade.   cup regular soda. EAT MORE OF THE FOLLOWING FOODS Fruit  Eat 4to 5 servings a day. One serving of fruit is:  1 medium whole fruit.   cup dried fruit.   cup of fresh, frozen, or canned fruit.   cup 100% fruit juice. Vegetables  Eat 4 to 5 servings a day. One serving is:  1 cup raw leafy vegetables.   cup raw or cooked, cut-up vegetables.   cup vegetable juice. Whole Grains  Eat 3 servings a day (1 ounce equals 1 serving). Legumes (such as beans, peas, and lentils)   Eat at least 4 servings a  week ( cup equals 1 serving). Nuts and Seeds   Eat at least 4 servings a week ( cup equals 1 serving). Dietary Fiber  Eat 20 to 30 grams a day. Some foods high in dietary fiber include:  Dried beans.  Citrus fruits.  Apples, bananas.  Broccoli, Brussels sprouts, and eggplant.  Oats. Omega-3 Fats  Eat food with omega-3 fats. You can also take a dietary pill (supplement) that has 1 gram of DHA and EPA. Have 3.5 ounces of fatty fish a week, such as:  Salmon.  Mackerel.  [place] tuna.  Sardines.  L[place] trout.  Herring. PREPARING YOUR FOOD  Broil, bake, steam, or roast foods. Do not fry food. Do not cook food in butter (fat).  Use non-stick cooking sprays.  Remove skin from poultry, such as chicken and [place].  Remove fat from meat.  Take the fat off the top of stews, soups, and gravy.  Angiogram, Care After Refer to this sheet in the next few weeks. These instructions provide you with information on caring for yourself after your procedure. Your health care provider may also give you more specific instructions. Your treatment has been planned according to current medical practices, but problems sometimes occur. Call your health care provider if you have any problems or questions after your procedure.  WHAT TO EXPECT AFTER THE PROCEDURE After your procedure, it is typical to have the following sensations:  Minor discomfort or tenderness and a small bump at the catheter insertion site. The bump should usually decrease in size and tenderness within 1 to 2 weeks.  Any bruising will usually fade within 2 to 4 weeks. HOME CARE INSTRUCTIONS   You may need to keep taking blood thinners if they were prescribed for you. Take medicines only as directed by your health care provider.  Do not apply powder or lotion to the site.  Do not take baths, swim, or use a hot tub until your health care provider approves.  You may shower 24 hours after the procedure. Remove the  bandage (dressing) and gently wash the site with plain soap and water. Gently pat the site dry.  Inspect the site at least twice daily.  Limit your activity for the first 48 hours. Do not bend, squat, or lift anything over 20 lb (9 kg) or as directed by your health care provider.  Plan to have someone take you home after the procedure. Follow instructions about when you can drive or return to work. SEEK MEDICAL CARE IF:  You get light-headed when standing up.  You have drainage (other than a small amount of blood on the dressing).  You have chills.  You have a fever.  You have redness, warmth, swelling, or pain at the insertion site. SEEK IMMEDIATE MEDICAL CARE IF:   You develop chest pain or shortness of breath, feel faint, or pass out.  You have bleeding, swelling larger than a walnut, or drainage from the catheter insertion site.  You develop pain, discoloration, coldness, or severe bruising in the leg or arm that held the catheter.  You develop bleeding from any other place, such as the bowels. You may see bright red blood in your urine or stools, or your stools may appear black and tarry.  You have heavy bleeding from the site. If this happens, hold pressure on the site. MAKE SURE YOU:  Understand these instructions.  Will watch your condition.  Will get help right away if you are not doing well or get worse. Document Released: 05/29/2005 Document Revised: 03/27/2014 Document Reviewed: 04/04/2013 [facility] Patient Information 2015 [place], [place]. This information is not intended to replace advice given to you by your health care provider. Make sure you discuss any questions you have with your health care provider.   Use lemon or herbs to flavor food instead of using butter or margarine.  Use nonfat yogurt, salsa, or low-fat dressings for salads. Document Released: 05/11/2012 Document Reviewed: 05/11/2012 [facility] Patient Information 2015 [place]. This  information is not intended to replace advice given to you by your health care provider. Make sure you discuss any questions you have with your health care provider.

## 2015-01-19 NOTE — Anesthesia Postprocedure Evaluation (Signed)
  Anesthesia Post-op Note  Patient: Justin Hill  Procedure(s) Performed: Procedure(s): ATRIAL FLUTTER ABLATION (N/A)  Patient Location: PACU and EP postop  Anesthesia Type:General  Level of Consciousness: awake, pateint uncooperative and confused  Airway and Oxygen Therapy: Patient Spontanous Breathing  Post-op Pain: none  Post-op Assessment: Post-op Vital signs reviewed, Patient's Cardiovascular Status Stable, Respiratory Function Stable, Patent Airway and No signs of Nausea or vomiting  Post-op Vital Signs: Reviewed and stable  Last Vitals:  Filed Vitals:   01/19/15 0939  BP: 148/76  Pulse: 71  Temp: 36.1 C  Resp: 20    Complications: No apparent anesthesia complications

## 2015-01-19 NOTE — Transfer of Care (Signed)
Immediate Anesthesia Transfer of Care Note  Patient: Justin Hill  Procedure(s) Performed: Procedure(s): ATRIAL FLUTTER ABLATION (N/A)  Patient Location: PACU and EP lab postop  Anesthesia Type:General  Level of Consciousness: awake and confused  Airway & Oxygen Therapy: Patient Spontanous Breathing and Pt tore oxygen tubing from nose and would not let us replace it.  Post-op Assessment: Report given to RN, Post -op Vital signs reviewed and stable and Patient moving all extremities  Post vital signs: Reviewed and stable  Last Vitals:  Filed Vitals:   01/19/15 0939  BP: 148/76  Pulse: 71  Temp: 36.1 C  Resp: 20    Complications: No apparent anesthesia complications

## 2015-01-19 NOTE — Anesthesia Postprocedure Evaluation (Signed)
  Anesthesia Post-op Note  Patient: Justin Hill  Procedure(s) Performed: Procedure(s): ATRIAL FLUTTER ABLATION (N/A)  Patient Location: PACU  Anesthesia Type:General  Level of Consciousness: awake  Airway and Oxygen Therapy: Patient Spontanous Breathing  Post-op Pain: none  Post-op Assessment: Post-op Vital signs reviewed, Patient's Cardiovascular Status Stable, Respiratory Function Stable, Patent Airway, No signs of Nausea or vomiting and Pain level controlled  Post-op Vital Signs: Reviewed and stable  Last Vitals:  Filed Vitals:   01/19/15 1943  BP: 146/65  Pulse: 95  Temp:   Resp: 17    Complications: No apparent anesthesia complications

## 2015-01-19 NOTE — Op Note (Signed)
PREPROCEDURE DIAGNOSIS: Typical Atrial flutter.   POSTPROCEDURE DIAGNOSIS: Right Atrial flutter.   PROCEDURES:  1. Comprehensive electrophysiology study.  2. Coronary sinus pacing and recording.  3. Mapping of atrial flutter.  5. Ablation of atrial flutter.  6. Arrhythmia induction with dobutamine infusion and pacing.   INTRODUCTION: Justin Hill is a 74 y.o. male with symptomatic typical appearing atrial flutter. He presents today for EP study and radiofrequency ablation.   DESCRIPTION OF PROCEDURE: Informed written consent was obtained, and the patient was brought to the Electrophysiology Lab in the fasting state. He was adequately sedated as outlined in  the anesthesia report. The patient's right groin was prepped and draped in the usual sterile fashion by the EP lab staff. Using a percutaneous Seldinger technique, one 6, one 7, and one 8-French hemostasis sheaths were placed in the right common femoral vein. A 7-French Biosence Webster decapolar catheter was introduced through the right common femoral vein and advanced into the coronary sinus for recording and pacing from this location. A 6-French quadripolar Josephson catheter was introduced through the right common femoral vein and advanced into the right ventricle for recording and pacing, but this catheter was then pulled back to the His bundle location.   Initial Measurements: The patient presented to the electrophysiology lab in sinus rhythm with a LBBB.  With catheter positioning along the His region, he developed transient complete heart block which quickly converted to 2:1 AV block and then 1:1 conduction.  Once his conduction returned, his PR interval measured 162 msec with a QRS duration of 164  Msec (LBBB) and a QT interval 419 msec. His RR interval measured 775 msec. His AH interval measured 148 msec with an HV interval of 76 msec.   Pacing Maneuvers: Ventricular pacing was performed which revealed initially VA  dissociation.  Rapid atrial pacing was performed which revealed an AV Wenckebach cycle length of 500 msec. There was no evidence of PR greater than RR. Rapid atrial pacing was continued down to a cycle length of 230 msec with atrial flutter induced.  There was proximal to distal CS activation with an atrial flutter cycle length of 260 msec.  This was therefore felt to represent right atrial flutter.  The surface EKG revealed typical atrial flutter which was his clinical arrhythmia.  Entrainment mapping was attempted with pacing from the left atrium, however atrial flutter terminated terminated with pacing.  Multiple attempts to re- induce atrial flutter were made, however, this was unsuccessful.  I elected to perform cavotricuspid isthmus ablation.  Ablation: A 7-French [employer] II XP 10-mm ablation catheter was introduced through the right common femoral vein and advanced into the right atrium. Atrial mapping along the cavotricuspid isthmus was performed. This demonstrated a standard  CavoTricuspid isthmus. A series of 11 radiofrequency applications were delivered along the cavotricuspid isthmus with a target temperature of 60 degrees at 70 watts for 120 seconds each.  A duo-decapolar circular mapping catheter was introduced through the right common femoral vein and advanced into the right atrium. This Halo catheter was then positioned around the cavotricuspid isthmus. Differential atrial pacing was again performed which revealed complete bidirectional isthmus block. The patient was observed for 20 minutes without return of conduction through the isthmus.   Measurements Post ablation: Following ablation, the AH interval measured 79 msec with an HV interval of 90 msec. The AV Wenckebach cycle length was 320 msec. Rapid atrial pacing was again performed down to a cycle length of 200 msec without any  arrhythmias observed today.  Dobutamine was infused at 10 mcg/kg/min.  During dobutamine  additional pacing was performed.  Ventricular pacing revealed midline/ concentric VA conduction with a VA wenckebach cycle length of 450 msec. VEST was performed which revealed no retrograde jumps, echo beats, or tachycardias.  The Retrograde VA ERP was 500/380 msec.   Atrial pacing revealed no evidence of PR> RR and no arrhythmias induced when pacing down to a CL of 200 msec.  The AV WCL was 320 msec.  AEST revealed decremental AV conduction with no AH jumps, echo beats, or tachycardias.  The atrial ERP was 500/240 msec.   Programmed stim was then performed from the RV apex during dobutamine infusion down to refractoriness (500/300/260/220 msec) with no inducible VT or bundle branch reentry.   The procedure was therefore considered completed. All catheters were removed and the sheaths were aspirated and flushed. The sheaths were removed and hemostasis was assured. EBL<67ml. There were no early apparent complications.    CONCLUSIONS:  1. Sinus rhythm upon presentation.  2. Right Atrial flutter induced today 3. Cavotricuspid isthmus ablation was performed with complete bidirectional isthmus block achieved.  4. No inducible arrhythmias following ablation both on and off of dobutamine 5. No evidence of accessory pathways, or dual av nodal physiology.  No inducible bundle branch reentry or VT post ablation on dobutamine.  6. No early apparent complications.   [name] MD 01/19/2015 1:05 PM

## 2015-01-23 ENCOUNTER — Other Ambulatory Visit: Payer: Self-pay | Admitting: Cardiology

## 2015-01-23 ENCOUNTER — Telehealth: Payer: Self-pay | Admitting: Internal Medicine

## 2015-01-23 NOTE — Progress Notes (Signed)
Subject screened and consented to Genetic AF 01/15/2015. Genetic AF lab work received from sponsor and pt does not possess genetic marker to continue in study. Pt advised and verbalized understanding. Pt will f/u with Dr. Rayann Heman and Roderic Palau as scheduled.

## 2015-01-23 NOTE — Telephone Encounter (Signed)
New message      Pt c/o Shortness Of Breath: STAT if SOB developed within the last 24 hours or pt is noticeably SOB on the phone  1. Are you currently SOB (can you hear that pt is SOB on the phone)  Wife on phone 2. How long have you been experiencing SOB? yesterday 3. Are you SOB when sitting or when up moving around? Only when pt is active---climbing basement steps sob but no sob while walking to mailbox----after pt rest a few minutes sob goes away  4. Are you currently experiencing any other symptoms? No Pt has a ablation last Friday.  No chest pain or other symptoms.  Bp is 138/62 HR 74.  Eating and sleeping ok.  Just want to make sure this is normal after ablation.  Saturday and Sunday pt was very inactive.

## 2015-01-24 NOTE — Telephone Encounter (Signed)
Spoke with patient's wife and patient.  He is experiencing SOB when he climbs stairs.  Says he did have this prior to the ablation but it did not seem to be as bad.  Denies pain,swelling, SOB at rest, weight is stable and says his HR is staying below 90.  I let him know I would discuss with Dr Rayann Heman and call him back tomorrow.  Discussed with Justin Palau, NP will have him continue to monitor his symptoms.  If he does not get better or the SOB becomes worse he should call and we will add him on to her schedule

## 2015-01-31 ENCOUNTER — Ambulatory Visit: Payer: Medicare Other | Admitting: Internal Medicine

## 2015-02-05 ENCOUNTER — Telehealth: Payer: Self-pay | Admitting: *Deleted

## 2015-02-05 NOTE — Telephone Encounter (Signed)
Patients wife called and stated the [employer] informed them that the xarelto needs a prior authorization. The patient has [employer] and his member id number is [id number]. He has a ten day supply on hand. Thanks, MI

## 2015-02-05 NOTE — Telephone Encounter (Signed)
PA done and approved until 02/04/2016  PA # is [id number]

## 2015-02-19 ENCOUNTER — Encounter: Payer: Self-pay | Admitting: Internal Medicine

## 2015-02-19 ENCOUNTER — Ambulatory Visit (INDEPENDENT_AMBULATORY_CARE_PROVIDER_SITE_OTHER): Payer: Medicare Other | Admitting: Internal Medicine

## 2015-02-19 VITALS — BP 130/82 | HR 90 | Ht 71.0 in | Wt 262.0 lb

## 2015-02-19 DIAGNOSIS — I4892 Unspecified atrial flutter: Secondary | ICD-10-CM | POA: Diagnosis not present

## 2015-02-19 DIAGNOSIS — I5022 Chronic systolic (congestive) heart failure: Secondary | ICD-10-CM

## 2015-02-19 NOTE — Progress Notes (Deleted)
Primary Care Physician: Cyndy Freeze, MD Cardiologist: Dr. Soundra Pilon Justin Hill is a 74 y.o. male  nonischemic CM, chronic systolic dysfunction,  EF 25-30%, LBBB, diabetes, HTN, and obesity s/p  Aflutter ablation 2/26 . He is followed closely by Dr Aundra Dubin.The patient is felt to have chronically NYHA Class I symptoms. He has been encouraged to have a  CPX test by Dr. Aundra Dubin  but has declined this previously. Screened for genetic Af and not a candidate. Since ablation, he has not has any further episodes of fast heart beat. Feels well. Continues on xarelto without bleeding issues. Will need f/u echo in SR to see if EF improves. Is already scheduled with Dr. Aundra Dubin with follow up in July.  Today, he denies symptoms of palpitations, chest pain, shortness of breath, orthopnea, PND, lower extremity edema, dizziness, presyncope, syncope, or neurologic sequela. The patient is tolerating medications without difficulties and is otherwise without complaint today.   Past Medical History  Diagnosis Date  . Cardiomyopathy   . Personal history of kidney stones   . Cardiomyopathy   . CHF (congestive heart failure)   . Diabetes mellitus   . Hypercholesterolemia   . Hypertension   . Diverticulosis   . A-fib    Past Surgical History  Procedure Laterality Date  . Shoulder surgery  1981    left. Muscle reattachment  . Tonsillectomy    . Toe surgery Right 12/2012    2nd toe  . Colonoscopy N/A 12/29/2013    Procedure: COLONOSCOPY;  Surgeon: Lafayette Dragon, MD;  Location: [facility];  Service: Endoscopy;  Laterality: N/A;  . Atrial flutter ablation N/A 01/19/2015    Procedure: ATRIAL FLUTTER ABLATION;  Surgeon: [name], MD;  Location: [facility] CATH LAB;  Service: Cardiovascular;  Laterality: N/A;    Current Outpatient Prescriptions  Medication Sig Dispense Refill  . Ascorbic Acid (VITAMIN C) 500 MG CAPS Take 1 capsule by mouth 2 (two) times daily.     . Bilberry, Vaccinium myrtillus,  (BILBERRY EXTRACT PO) Take 1,000 mg by mouth daily.     . carvedilol (COREG) 25 MG tablet TAKE ONE TABLET BY MOUTH TWICE DAILY WITH MEALS 180 tablet 1  . CINNAMON PO Take 1 tablet by mouth every evening.     Marland Kitchen Dextromethorphan-Guaifenesin (CORICIDIN HBP CONGESTION/COUGH PO) Take 1 tablet by mouth daily as needed (cold symptoms).    . diphenhydramine-acetaminophen (TYLENOL PM) 25-500 MG TABS Take 2 tablets by mouth at bedtime as needed (sleep).     . insulin NPH Human (HUMULIN N) 100 UNIT/ML injection 85 units each morning, and 50 units each evening (Patient taking differently: Inject 85 units into the skin each morning, and inject 50 units into the skin each evening) 50 mL 11  . Multiple Vitamin (MULTIVITAMIN) tablet Take 1 tablet by mouth daily.      . rivaroxaban (XARELTO) 20 MG TABS tablet Take 1 tablet (20 mg total) by mouth daily with supper. 30 tablet 10  . simvastatin (ZOCOR) 40 MG tablet Take 40 mg by mouth at bedtime.      . TRILIPIX 135 MG capsule Take 1 tablet by mouth daily.     No current facility-administered medications for this visit.    Allergies  Allergen Reactions  . Aspirin Other (See Comments)    Burps, burning, stomach pains, etc  . Penicillins Other (See Comments)    syncope    History   Social History  . Marital Status: Married    Spouse Name: [name]  Ann  . Number of Children: 1  . Years of Education: 12   Occupational History  . retired    Social History Main Topics  . Smoking status: Former Smoker    Types: Cigarettes  . Smokeless tobacco: Never Used  . Alcohol Use: No  . Drug Use: No  . Sexual Activity: Not on file   Other Topics Concern  . Not on file   Social History Narrative   Regular exercise-no          Family History  Problem Relation Age of Onset  . Heart failure Maternal Grandmother   . Diabetes type II Sister 34  . Hyperlipidemia Other     Parent  . Stroke Other     Parent  . Hypertension Other     Parent  . Diabetes  Other     Parent  . Colon cancer Neg Hx     ROS- All systems are reviewed and negative except as per the HPI above  Physical Exam: Filed Vitals:   02/19/15 1046  BP: 130/82  Pulse: 90  Height: 5\' 11"  (1.803 m)  Weight: 262 lb (118.842 kg)    GEN- The patient is well appearing, alert and oriented x 3 today.   Head- normocephalic, atraumatic Eyes-  Sclera clear, conjunctiva pink Ears- hearing intact Oropharynx- clear Neck- supple, no JVP Lymph- no cervical lymphadenopathy Lungs- Clear to ausculation bilaterally, normal work of breathing Heart- Regular rate and rhythm, no murmurs, rubs or gallops, PMI not laterally displaced GI- soft, NT, ND, + BS Extremities- no clubbing, cyanosis, or edema MS- no significant deformity or atrophy Skin- no rash or lesion Psych- euthymic mood, full affect Neuro- strength and sensation are intact   Epic records are reviewed. EKG-Sinus Rhythm,LBBB Echo-  06/12/14-Left ventricle: The cavity size was mildly dilated. There was mild concentric hypertrophy. Systolic function was severely reduced. The estimated ejection fraction was in the range of 25% to 30%. Diffuse hypokinesis. - Ventricular septum: Septal motion showed LBBB related dyssynergy, and paradoxical motion. - Aortic valve: There was trivial regurgitation.  Assessment and Plan: 1. S/p aflutter ablation Doing well, maintaining SR  2.  Nonischemic cardiomyopathy/LBBB Has orders for repeat echo and f/u with Dr. Aundra Dubin in July Hopefully with maintaining SR, EF will approve.  F/u with EP as needed.

## 2015-02-19 NOTE — Patient Instructions (Signed)
Your physician recommends that you schedule a follow-up appointment in: Dr Aundra Dubin in June and Dr Rayann Heman as needed  Your physician has requested that you have an echocardiogram. Echocardiography is a painless test that uses sound waves to create images of your heart. It provides your doctor with information about the size and shape of your heart and how well your heart's chambers and valves are working. This procedure takes approximately one hour. There are no restrictions for this procedure.---order already in system do both same day

## 2015-02-20 NOTE — Progress Notes (Signed)
Primary Care Physician: Cyndy Freeze, MD Cardiologist: Dr. Soundra Pilon Wyn[place] Justin Hill is a 74 y.o. male  nonischemic CM, chronic systolic dysfunction,  EF 25-30%, LBBB, diabetes, HTN, and obesity s/p  Aflutter ablation 2/26 . He is followed closely by Dr Aundra Dubin.The patient is felt to have chronically NYHA Class I symptoms. He has been encouraged to have a  CPX test by Dr. Aundra Dubin  but has declined this previously. Screened for genetic Af and not a candidate. Since ablation, he has not has any further episodes of fast heart beat. Feels well. Continues on xarelto without bleeding issues. Will need f/u echo in SR to see if EF improves. Is already scheduled with Dr. Aundra Dubin with follow up in July.  Today, he denies symptoms of palpitations, chest pain, shortness of breath, orthopnea, PND, lower extremity edema, dizziness, presyncope, syncope, or neurologic sequela. The patient is tolerating medications without difficulties and is otherwise without complaint today.   Past Medical History  Diagnosis Date  . Cardiomyopathy   . Personal history of kidney stones   . Cardiomyopathy   . CHF (congestive heart failure)   . Diabetes mellitus   . Hypercholesterolemia   . Hypertension   . Diverticulosis   . A-fib    Past Surgical History  Procedure Laterality Date  . Shoulder surgery  1981    left. Muscle reattachment  . Tonsillectomy    . Toe surgery Right 12/2012    2nd toe  . Colonoscopy N/A 12/29/2013    Procedure: COLONOSCOPY;  Surgeon: Lafayette Dragon, MD;  Location: [facility];  Service: Endoscopy;  Laterality: N/A;  . Atrial flutter ablation N/A 01/19/2015    Procedure: ATRIAL FLUTTER ABLATION;  Surgeon: [name], MD;  Location: [facility] CATH LAB;  Service: Cardiovascular;  Laterality: N/A;    Current Outpatient Prescriptions  Medication Sig Dispense Refill  . Ascorbic Acid (VITAMIN C) 500 MG CAPS Take 1 capsule by mouth 2 (two) times daily.     . Bilberry, Vaccinium myrtillus,  (BILBERRY EXTRACT PO) Take 1,000 mg by mouth daily.     . carvedilol (COREG) 25 MG tablet TAKE ONE TABLET BY MOUTH TWICE DAILY WITH MEALS 180 tablet 1  . CINNAMON PO Take 1 tablet by mouth every evening.     Marland Kitchen Dextromethorphan-Guaifenesin (CORICIDIN HBP CONGESTION/COUGH PO) Take 1 tablet by mouth daily as needed (cold symptoms).    . diphenhydramine-acetaminophen (TYLENOL PM) 25-500 MG TABS Take 2 tablets by mouth at bedtime as needed (sleep).     . insulin NPH Human (HUMULIN N) 100 UNIT/ML injection 85 units each morning, and 50 units each evening (Patient taking differently: Inject 85 units into the skin each morning, and inject 50 units into the skin each evening) 50 mL 11  . Multiple Vitamin (MULTIVITAMIN) tablet Take 1 tablet by mouth daily.      . rivaroxaban (XARELTO) 20 MG TABS tablet Take 1 tablet (20 mg total) by mouth daily with supper. 30 tablet 10  . simvastatin (ZOCOR) 40 MG tablet Take 40 mg by mouth at bedtime.      . TRILIPIX 135 MG capsule Take 1 tablet by mouth daily.     No current facility-administered medications for this visit.    Allergies  Allergen Reactions  . Aspirin Other (See Comments)    Burps, burning, stomach pains, etc  . Penicillins Other (See Comments)    syncope    History   Social History  . Marital Status: Married    Spouse Name: [name]  Ann  . Number of Children: 1  . Years of Education: 12   Occupational History  . retired    Social History Main Topics  . Smoking status: Former Smoker    Types: Cigarettes  . Smokeless tobacco: Never Used  . Alcohol Use: No  . Drug Use: No  . Sexual Activity: Not on file   Other Topics Concern  . Not on file   Social History Narrative   Regular exercise-no          Family History  Problem Relation Age of Onset  . Heart failure Maternal Grandmother   . Diabetes type II Sister 52  . Hyperlipidemia Other     Parent  . Stroke Other     Parent  . Hypertension Other     Parent  . Diabetes  Other     Parent  . Colon cancer Neg Hx     ROS- All systems are reviewed and negative except as per the HPI above  Physical Exam: Filed Vitals:   02/19/15 1046  BP: 130/82  Pulse: 90  Height: 5\' 11"  (1.803 m)  Weight: 262 lb (118.842 kg)    GEN- The patient is well appearing, alert and oriented x 3 today.   Head- normocephalic, atraumatic Eyes-  Sclera clear, conjunctiva pink Ears- hearing intact Oropharynx- clear Neck- supple, no JVP Lymph- no cervical lymphadenopathy Lungs- Clear to ausculation bilaterally, normal work of breathing Heart- Regular rate and rhythm, no murmurs, rubs or gallops, PMI not laterally displaced GI- soft, NT, ND, + BS Extremities- no clubbing, cyanosis, or edema MS- no significant deformity or atrophy Skin- no rash or lesion Psych- euthymic mood, full affect Neuro- strength and sensation are intact   Epic records are reviewed. EKG-Sinus Rhythm,LBBB Echo-  06/12/14-Left ventricle: The cavity size was mildly dilated. There was mild concentric hypertrophy. Systolic function was severely reduced. The estimated ejection fraction was in the range of 25% to 30%. Diffuse hypokinesis. - Ventricular septum: Septal motion showed LBBB related dyssynergy, and paradoxical motion. - Aortic valve: There was trivial regurgitation.  Assessment and Plan: 1. S/p aflutter ablation Doing well, maintaining SR Will follow clinically for afib.  Consider stoppiing anticoagulation if he does not have afib in the future  2.  Nonischemic cardiomyopathy/LBBB epeat echo in 3 months and f/u with Dr. Aundra Dubin in July Hopefully with maintaining SR, EF will approve. If EF remains <35%, could consider ICD at that time.  He will follow-up with Dr Aundra Dubin I am happy to see again if needed

## 2015-03-22 ENCOUNTER — Ambulatory Visit: Payer: Medicare Other | Admitting: Endocrinology

## 2015-03-26 ENCOUNTER — Ambulatory Visit: Payer: Medicare Other

## 2015-03-28 DIAGNOSIS — L218 Other seborrheic dermatitis: Secondary | ICD-10-CM | POA: Diagnosis not present

## 2015-04-04 ENCOUNTER — Encounter: Payer: Self-pay | Admitting: Endocrinology

## 2015-04-04 ENCOUNTER — Ambulatory Visit (INDEPENDENT_AMBULATORY_CARE_PROVIDER_SITE_OTHER): Payer: Medicare Other | Admitting: Endocrinology

## 2015-04-04 VITALS — BP 132/88 | HR 72 | Temp 99.2°F | Wt 260.0 lb

## 2015-04-04 DIAGNOSIS — N189 Chronic kidney disease, unspecified: Secondary | ICD-10-CM

## 2015-04-04 DIAGNOSIS — E1022 Type 1 diabetes mellitus with diabetic chronic kidney disease: Secondary | ICD-10-CM | POA: Diagnosis not present

## 2015-04-04 LAB — LIPID PANEL
CHOL/HDL RATIO: 3
Cholesterol: 140 mg/dL (ref 0–200)
HDL: 40.8 mg/dL (ref >39.00)
LDL Cholesterol: 73 mg/dL (ref 0–99)
NONHDL: 99.2
Triglycerides: 132 mg/dL (ref 0.0–149.0)
VLDL: 26.4 mg/dL (ref 0.0–40.0)

## 2015-04-04 LAB — HEMOGLOBIN A1C: Hgb A1c MFr Bld: 7.1 % — ABNORMAL HIGH (ref 4.6–6.5)

## 2015-04-04 MED ORDER — INSULIN NPH (HUMAN) (ISOPHANE) 100 UNIT/ML ~~LOC~~ SUSP: SUBCUTANEOUS | Status: DC

## 2015-04-04 NOTE — Progress Notes (Signed)
Subjective:    Patient ID: Justin Hill, male    DOB: [birth year], 74 y.o.   MRN: [id number]  HPI Pt returns for f/u of diabetes mellitus: DM type: Insulin-requiring type 2. Dx'ed: 123456 Complications: CHF, toe ulcer, partial toe amputation, and renal insufficiency.  Therapy: insulin since 2012.  DKA: never.   Severe hypoglycemia: never.   Pancreatitis: never.  Other: he takes human insulin for cost reasons; in October of 2014, he was changed from multiple daily injections to NPH only, due to persistently high a1c.   Interval history: hx is from pt and wife.  he brings a record of his cbg's which i have reviewed today.  It varies from 65-213, but almost all are in the 100's.  It is in general higher as the day goes on, but not necessarily so. Wife says cbg varies according to diet.  Past Medical History  Diagnosis Date  . Cardiomyopathy   . Personal history of kidney stones   . Cardiomyopathy   . CHF (congestive heart failure)   . Diabetes mellitus   . Hypercholesterolemia   . Hypertension   . Diverticulosis   . A-fib     Past Surgical History  Procedure Laterality Date  . Shoulder surgery  1981    left. Muscle reattachment  . Tonsillectomy    . Toe surgery Right 12/2012    2nd toe  . Colonoscopy N/A 12/29/2013    Procedure: COLONOSCOPY;  Surgeon: Lafayette Dragon, MD;  Location: [facility];  Service: Endoscopy;  Laterality: N/A;  . Atrial flutter ablation N/A 01/19/2015    Procedure: ATRIAL FLUTTER ABLATION;  Surgeon: [name], MD;  Location: [facility] CATH LAB;  Service: Cardiovascular;  Laterality: N/A;    History   Social History  . Marital Status: Married    Spouse Name: Rosemarie Beath  . Number of Children: 1  . Years of Education: 12   Occupational History  . retired    Social History Main Topics  . Smoking status: Former Smoker    Types: Cigarettes  . Smokeless tobacco: Never Used  . Alcohol Use: No  . Drug Use: No  . Sexual Activity: Not on file   Other  Topics Concern  . Not on file   Social History Narrative   Regular exercise-no          Current Outpatient Prescriptions on File Prior to Visit  Medication Sig Dispense Refill  . Ascorbic Acid (VITAMIN C) 500 MG CAPS Take 1 capsule by mouth 2 (two) times daily.     . Bilberry, Vaccinium myrtillus, (BILBERRY EXTRACT PO) Take 1,000 mg by mouth daily.     . carvedilol (COREG) 25 MG tablet TAKE ONE TABLET BY MOUTH TWICE DAILY WITH MEALS 180 tablet 1  . CINNAMON PO Take 1 tablet by mouth every evening.     Marland Kitchen Dextromethorphan-Guaifenesin (CORICIDIN HBP CONGESTION/COUGH PO) Take 1 tablet by mouth daily as needed (cold symptoms).    . diphenhydramine-acetaminophen (TYLENOL PM) 25-500 MG TABS Take 2 tablets by mouth at bedtime as needed (sleep).     . Multiple Vitamin (MULTIVITAMIN) tablet Take 1 tablet by mouth daily.      . rivaroxaban (XARELTO) 20 MG TABS tablet Take 1 tablet (20 mg total) by mouth daily with supper. 30 tablet 10  . simvastatin (ZOCOR) 40 MG tablet Take 40 mg by mouth at bedtime.      . TRILIPIX 135 MG capsule Take 1 tablet by mouth daily.  No current facility-administered medications on file prior to visit.    Allergies  Allergen Reactions  . Aspirin Other (See Comments)    Burps, burning, stomach pains, etc  . Penicillins Other (See Comments)    syncope    Family History  Problem Relation Age of Onset  . Heart failure Maternal Grandmother   . Diabetes type II Sister 72  . Hyperlipidemia Other     Parent  . Stroke Other     Parent  . Hypertension Other     Parent  . Diabetes Other     Parent  . Colon cancer Neg Hx     BP 132/88 mmHg  Pulse 72  Temp(Src) 99.2 F (37.3 C) (Oral)  Wt 260 lb (117.935 kg)  SpO2 95%   Review of Systems He denies LOC and weight change.     Objective:   Physical Exam VITAL SIGNS:  See vs page GENERAL: no distress EXTEMITIES: no deformity, except for hammer toes on the left foot. no ulcer on the feet. feet are of  normal temp. 1+ bilat leg edema. There is bilateral onychomycosis, rust-colored discoloration, and varicosities.  PULSES: dorsalis pedis intact bilat.  NEURO: sensation is intact to touch on the feet.   Lab Results  Component Value Date   HGBA1C 7.1* 04/04/2015       Assessment & Plan:  DM: overcontrolled, given this regimen, which does match insulin to her changing needs throughout the day  Patient is advised the following: Patient Instructions  Please reduce the insulin to 85 units each morning (50 if you are going to be active), and 40 units in the evening. On this type of insulin schedule, you should eat meals on a regular schedule.  If a meal is missed or significantly delayed, your blood sugar could go low.   check your blood sugar twice a day.  vary the time of day when you check, between before the 3 meals, and at bedtime.  also check if you have symptoms of your blood sugar being too high or too low.  please keep a record of the readings and bring it to your next appointment here.  please call us sooner if your blood sugar goes below 70, or if you have a lot of readings over 200.   If you have any blood sugar under 80, please write on the paper why you think that Please come back for a follow-up appointment in 4 months.

## 2015-04-04 NOTE — Patient Instructions (Addendum)
Please reduce the insulin to 85 units each morning (50 if you are going to be active), and 40 units in the evening. On this type of insulin schedule, you should eat meals on a regular schedule.  If a meal is missed or significantly delayed, your blood sugar could go low.   check your blood sugar twice a day.  vary the time of day when you check, between before the 3 meals, and at bedtime.  also check if you have symptoms of your blood sugar being too high or too low.  please keep a record of the readings and bring it to your next appointment here.  please call us sooner if your blood sugar goes below 70, or if you have a lot of readings over 200.   If you have any blood sugar under 80, please write on the paper why you think that Please come back for a follow-up appointment in 4 months.

## 2015-04-05 ENCOUNTER — Ambulatory Visit: Payer: Medicare Other | Admitting: Podiatrist

## 2015-04-10 DIAGNOSIS — E1165 Type 2 diabetes mellitus with hyperglycemia: Secondary | ICD-10-CM | POA: Diagnosis not present

## 2015-04-10 DIAGNOSIS — H6121 Impacted cerumen, right ear: Secondary | ICD-10-CM | POA: Diagnosis not present

## 2015-04-10 DIAGNOSIS — I110 Hypertensive heart disease with heart failure: Secondary | ICD-10-CM | POA: Diagnosis not present

## 2015-04-10 DIAGNOSIS — E1122 Type 2 diabetes mellitus with diabetic chronic kidney disease: Secondary | ICD-10-CM | POA: Diagnosis not present

## 2015-04-10 DIAGNOSIS — N183 Chronic kidney disease, stage 3 (moderate): Secondary | ICD-10-CM | POA: Diagnosis not present

## 2015-04-16 DIAGNOSIS — H6121 Impacted cerumen, right ear: Secondary | ICD-10-CM | POA: Diagnosis not present

## 2015-04-19 ENCOUNTER — Ambulatory Visit (INDEPENDENT_AMBULATORY_CARE_PROVIDER_SITE_OTHER): Payer: Medicare Other | Admitting: Podiatrist

## 2015-04-19 ENCOUNTER — Encounter: Payer: Self-pay | Admitting: Podiatrist

## 2015-04-19 VITALS — BP 131/77 | HR 88 | Resp 18

## 2015-04-19 DIAGNOSIS — M79676 Pain in unspecified toe(s): Secondary | ICD-10-CM

## 2015-04-19 DIAGNOSIS — B351 Tinea unguium: Secondary | ICD-10-CM | POA: Diagnosis not present

## 2015-04-19 DIAGNOSIS — E1140 Type 2 diabetes mellitus with diabetic neuropathy, unspecified: Secondary | ICD-10-CM

## 2015-04-19 DIAGNOSIS — L603 Nail dystrophy: Secondary | ICD-10-CM

## 2015-04-19 NOTE — Patient Instructions (Signed)
Diabetes and Foot Care Diabetes may cause you to have problems because of poor blood supply (circulation) to your feet and legs. This may cause the skin on your feet to become thinner, break easier, and heal more slowly. Your skin may become dry, and the skin may peel and crack. You may also have nerve damage in your legs and feet causing decreased feeling in them. You may not notice minor injuries to your feet that could lead to infections or more serious problems. Taking care of your feet is one of the most important things you can do for yourself.  HOME CARE INSTRUCTIONS  Wear shoes at all times, even in the house. Do not go barefoot. Bare feet are easily injured.  Check your feet daily for blisters, cuts, and redness. If you cannot see the bottom of your feet, use a mirror or ask someone for help.  Wash your feet with warm water (do not use hot water) and mild soap. Then pat your feet and the areas between your toes until they are completely dry. Do not soak your feet as this can dry your skin.  Apply a moisturizing lotion or petroleum jelly (that does not contain alcohol and is unscented) to the skin on your feet and to dry, brittle toenails. Do not apply lotion between your toes.  Trim your toenails straight across. Do not dig under them or around the cuticle. File the edges of your nails with an emery board or nail file.  Do not cut corns or calluses or try to remove them with medicine.  Wear clean socks or stockings every day. Make sure they are not too tight. Do not wear knee-high stockings since they may decrease blood flow to your legs.  Wear shoes that fit properly and have enough cushioning. To break in new shoes, wear them for just a few hours a day. This prevents you from injuring your feet. Always look in your shoes before you put them on to be sure there are no objects inside.  Do not cross your legs. This may decrease the blood flow to your feet.  If you find a minor scrape,  cut, or break in the skin on your feet, keep it and the skin around it clean and dry. These areas may be cleansed with mild soap and water. Do not cleanse the area with peroxide, alcohol, or iodine.  When you remove an adhesive bandage, be sure not to damage the skin around it.  If you have a wound, look at it several times a day to make sure it is healing.  Do not use heating pads or hot water bottles. They may burn your skin. If you have lost feeling in your feet or legs, you may not know it is happening until it is too late.  Make sure your health care provider performs a complete foot exam at least annually or more often if you have foot problems. Report any cuts, sores, or bruises to your health care provider immediately. SEEK MEDICAL CARE IF:   You have an injury that is not healing.  You have cuts or breaks in the skin.  You have an ingrown nail.  You notice redness on your legs or feet.  You feel burning or tingling in your legs or feet.  You have pain or cramps in your legs and feet.  Your legs or feet are numb.  Your feet always feel cold. SEEK IMMEDIATE MEDICAL CARE IF:   There is increasing redness,   swelling, or pain in or around a wound.  There is a red line that goes up your leg.  Pus is coming from a wound.  You develop a fever or as directed by your health care provider.  You notice a bad smell coming from an ulcer or wound. Document Released: 11/07/2000 Document Revised: 07/13/2013 Document Reviewed: 04/19/2013 ExitCare Patient Information 2015 [employer]. This information is not intended to replace advice given to you by your health care provider. Make sure you discuss any questions you have with your health care provider.  

## 2015-04-24 NOTE — Progress Notes (Signed)
HPI: Patient presents today for follow up of diabetic foot and nail care. Past medical history, meds, and allergies reviewed. Patient states blood sugar is under good  control.   Objective:   Objective:  Patients chart is reviewed.  Vascular status reveals pedal pulses noted at  1 out of 4 dp and pt bilateral .  Neurological sensation is Decreased to [employer] monofilament bilateral at 3/5 sites bilateral.  Dermatological exam reveals  absence of pre ulcerative/ hyperkeratotic lesions.   Toenails are elongated, incurvated, discolored, dystrophic with ingrown deformity present.    Assessment: Diabetes with Neuropathy, symptomatic and mycotic toenails  Plan: Discussed treatment options and alternatives. Debrided nails without complication.   Return appointment recommended at routine intervals of 3 months.

## 2015-04-30 DIAGNOSIS — L304 Erythema intertrigo: Secondary | ICD-10-CM | POA: Diagnosis not present

## 2015-04-30 DIAGNOSIS — L219 Seborrheic dermatitis, unspecified: Secondary | ICD-10-CM | POA: Diagnosis not present

## 2015-05-09 DIAGNOSIS — S161XXA Strain of muscle, fascia and tendon at neck level, initial encounter: Secondary | ICD-10-CM | POA: Diagnosis not present

## 2015-05-09 DIAGNOSIS — S46919A Strain of unspecified muscle, fascia and tendon at shoulder and upper arm level, unspecified arm, initial encounter: Secondary | ICD-10-CM | POA: Diagnosis not present

## 2015-05-17 ENCOUNTER — Encounter: Payer: Self-pay | Admitting: Cardiology

## 2015-05-17 ENCOUNTER — Ambulatory Visit (HOSPITAL_COMMUNITY): Payer: Medicare Other | Attending: Cardiovascular Disease

## 2015-05-17 ENCOUNTER — Ambulatory Visit (INDEPENDENT_AMBULATORY_CARE_PROVIDER_SITE_OTHER): Payer: Medicare Other | Admitting: Cardiology

## 2015-05-17 ENCOUNTER — Other Ambulatory Visit: Payer: Self-pay

## 2015-05-17 VITALS — BP 132/82 | HR 84 | Ht 71.0 in | Wt 260.0 lb

## 2015-05-17 DIAGNOSIS — I5022 Chronic systolic (congestive) heart failure: Secondary | ICD-10-CM | POA: Diagnosis not present

## 2015-05-17 DIAGNOSIS — I517 Cardiomegaly: Secondary | ICD-10-CM | POA: Diagnosis not present

## 2015-05-17 DIAGNOSIS — I2510 Atherosclerotic heart disease of native coronary artery without angina pectoris: Secondary | ICD-10-CM | POA: Diagnosis not present

## 2015-05-17 DIAGNOSIS — Z87891 Personal history of nicotine dependence: Secondary | ICD-10-CM | POA: Insufficient documentation

## 2015-05-17 DIAGNOSIS — I4891 Unspecified atrial fibrillation: Secondary | ICD-10-CM

## 2015-05-17 DIAGNOSIS — N183 Chronic kidney disease, stage 3 (moderate): Secondary | ICD-10-CM | POA: Insufficient documentation

## 2015-05-17 DIAGNOSIS — E785 Hyperlipidemia, unspecified: Secondary | ICD-10-CM | POA: Insufficient documentation

## 2015-05-17 DIAGNOSIS — I447 Left bundle-branch block, unspecified: Secondary | ICD-10-CM | POA: Diagnosis not present

## 2015-05-17 DIAGNOSIS — I429 Cardiomyopathy, unspecified: Secondary | ICD-10-CM

## 2015-05-17 DIAGNOSIS — E119 Type 2 diabetes mellitus without complications: Secondary | ICD-10-CM | POA: Insufficient documentation

## 2015-05-17 DIAGNOSIS — I509 Heart failure, unspecified: Secondary | ICD-10-CM | POA: Diagnosis present

## 2015-05-17 DIAGNOSIS — I129 Hypertensive chronic kidney disease with stage 1 through stage 4 chronic kidney disease, or unspecified chronic kidney disease: Secondary | ICD-10-CM | POA: Diagnosis not present

## 2015-05-17 DIAGNOSIS — I483 Typical atrial flutter: Secondary | ICD-10-CM

## 2015-05-17 DIAGNOSIS — I428 Other cardiomyopathies: Secondary | ICD-10-CM

## 2015-05-17 MED ORDER — TORSEMIDE 10 MG PO TABS: 10.0000 mg | ORAL_TABLET | Freq: Every day | ORAL | Status: DC

## 2015-05-17 MED ORDER — LISINOPRIL 2.5 MG PO TABS: 2.5000 mg | ORAL_TABLET | Freq: Every day | ORAL | Status: DC

## 2015-05-17 NOTE — Patient Instructions (Signed)
Medication Instructions:  Start lisinopril 2.5mg  daily.  Take torsemide 10mg  daily  Labwork:  BMET/CBCd/BNP today. BMET/BNP in 2 weeks. I have given you an order for this. Please fax the results to Dr Aundra Dubin [phone]  Testing/Procedures: None today  Follow-Up: Your physician recommends that you schedule a follow-up appointment in: 6 weeks with Dr Aundra Dubin in the [facility].   Any Other Special Instructions Will Be Listed Below (If Applicable).  Limit your salt (sodium) intake to less than 2G (2000mg ) daily.  Limit your fluid intake to less than 2 liters daily

## 2015-05-18 ENCOUNTER — Telehealth: Payer: Self-pay | Admitting: Cardiology

## 2015-05-18 DIAGNOSIS — R310 Gross hematuria: Secondary | ICD-10-CM | POA: Diagnosis not present

## 2015-05-18 DIAGNOSIS — I5022 Chronic systolic (congestive) heart failure: Secondary | ICD-10-CM | POA: Insufficient documentation

## 2015-05-18 LAB — BASIC METABOLIC PANEL
BUN: 18 mg/dL (ref 6–23)
CO2: 28 meq/L (ref 19–32)
CREATININE: 1.31 mg/dL (ref 0.40–1.50)
Calcium: 9.4 mg/dL (ref 8.4–10.5)
Chloride: 103 mEq/L (ref 96–112)
GFR: 57.13 mL/min — ABNORMAL LOW (ref >60.00)
GLUCOSE: 158 mg/dL — AB (ref 70–99)
Potassium: 4.5 mEq/L (ref 3.5–5.1)
Sodium: 138 mEq/L (ref 135–145)

## 2015-05-18 LAB — CBC WITH DIFFERENTIAL/PLATELET
BASOS PCT: 0.9 % (ref 0.0–3.0)
Basophils Absolute: 0.1 10*3/uL (ref 0.0–0.1)
EOS ABS: 0.2 10*3/uL (ref 0.0–0.7)
Eosinophils Relative: 3 % (ref 0.0–5.0)
HCT: 43.4 % (ref 39.0–52.0)
Hemoglobin: 14.4 g/dL (ref 13.0–17.0)
Lymphocytes Relative: 25 % (ref 12.0–46.0)
Lymphs Abs: 1.8 10*3/uL (ref 0.7–4.0)
MCHC: 33.3 g/dL (ref 30.0–36.0)
MCV: 87.3 fl (ref 78.0–100.0)
Monocytes Absolute: 0.5 10*3/uL (ref 0.1–1.0)
Monocytes Relative: 7.2 % (ref 3.0–12.0)
NEUTROS ABS: 4.5 10*3/uL (ref 1.4–7.7)
NEUTROS PCT: 63.9 % (ref 43.0–77.0)
PLATELETS: 133 10*3/uL — AB (ref 150.0–400.0)
RBC: 4.97 Mil/uL (ref 4.22–5.81)
RDW: 14.2 % (ref 11.5–15.5)
WBC: 7 10*3/uL (ref 4.0–10.5)

## 2015-05-18 LAB — BRAIN NATRIURETIC PEPTIDE: Pro B Natriuretic peptide (BNP): 43 pg/mL (ref 0.0–100.0)

## 2015-05-18 NOTE — Progress Notes (Signed)
Patient ID: Justin Hill, male   DOB: 17-May-1941, 74 y.o.   MRN: [id number] PCP: Dr. Camillia Herter  74 yo with history CKD, HTN, diabetes, chronic LBBB, and nonischemic cardiomyopathy presents for cardiology followup.  His cardiomyopathy has been known for years.  Most recent echo was done today and showed stable EF 25-30% with septal-lateral dyssynchrony.  He had a LHC in 2008 showing mild nonobstructive CAD.  He is only on Coreg as he had hyperkalemia/AKI with ACEI use.     Since his last appointment with me, Justin Hill was admitted in 2/16 with atrial flutter degenerating into atrial fibrillation.  He was initially cardioverted, then atrial flutter recurred.  He had atrial flutter ablation by Dr Rayann Heman in 2/16.  He thinks that he has been in NSR since that time, no further palpitations.   Patient reports dyspnea walking up stairs and up a hill.  He is not particularly symptomatic but his wife says that he is also not very active ("has adjusted to not doing anything").  Weight is up 1 lb since last appointment.  No chest pain.  No lightheadedness.  No orthopnea/PND.  In the past, he has denied symptoms and refused CRT-D.  He is on Xarelto now with no melena or BRBPR.   Labs (5/14): K 4, creatinine 1.4, LFTs normal, LDL 46, HDL 37 Labs (1/15): K 4.4, creatinine 1.46, LDL 74, HDL 35 Labs (4/15); LDL 96, HDL 44, K 5.2, creatinine 1.4 Labs (1/16): K 4.9, creatinine 1.36 Labs (2/16): K 4.5, creatinine 1.56, BNP 412  ECG: NSR, LBBB with QRS 168 msec  PMH: 1. Type II diabetes with peripheral neuropathy.  2. CKD 3. Hyperlipidemia 4. HTN 5. Nonischemic cardiomyopathy: This was diagnosed prior to 2008.  In 2008, patient had LHC with only mild nonobstructive coronary disease.  Echo in 1/14 showed EF 25-30% with normal RV.  Patient has not tolerated ACEI due to hyperkalemia and AKI. ICD was discussed (Dr Lovena Le) and decided against given NYHA class I symptoms.  Echo (7/15) with EF 25-30%,  septal-lateral dyssynchrony, mild LV dilation. Echo (6/16) with EF 25-30%, mild LVH, septal-lateral dyssynchrony, normal RV size and systolic function.  6. Chronic LBBB 7. Obesity 8. Atrial flutter and atrial fibrillation: S/p atrial flutter ablation in 2/16 ([facility]).   SH: Lives in [place], married, prior smoker, no ETOH.  1 child.   FH: Grandmother with CHF.  Mother with CVA.   ROS: All systems reviewed and negative except as per HPI.    Current Outpatient Prescriptions  Medication Sig Dispense Refill  . Ascorbic Acid (VITAMIN C) 500 MG CAPS Take 1 capsule by mouth 2 (two) times daily.     . Bilberry, Vaccinium myrtillus, (BILBERRY EXTRACT PO) Take 1,000 mg by mouth daily.     . carvedilol (COREG) 25 MG tablet TAKE ONE TABLET BY MOUTH TWICE DAILY WITH MEALS 180 tablet 1  . CINNAMON PO Take 1 tablet by mouth every evening.     . diphenhydramine-acetaminophen (TYLENOL PM) 25-500 MG TABS Take 2 tablets by mouth at bedtime as needed (sleep).     . insulin NPH Human (HUMULIN N) 100 UNIT/ML injection 85 units each morning, and 40 units each evening, and syringes 2/day 40 mL 11  . Multiple Vitamin (MULTIVITAMIN) tablet Take 1 tablet by mouth daily.      . rivaroxaban (XARELTO) 20 MG TABS tablet Take 1 tablet (20 mg total) by mouth daily with supper. 30 tablet 10  . simvastatin (ZOCOR) 40 MG tablet Take  40 mg by mouth at bedtime.      . torsemide (DEMADEX) 10 MG tablet Take 1 tablet (10 mg total) by mouth daily. 30 tablet 1  . triamcinolone cream (KENALOG) 0.1 %     . lisinopril (PRINIVIL,ZESTRIL) 2.5 MG tablet Take 1 tablet (2.5 mg total) by mouth daily. 30 tablet 1   No current facility-administered medications for this visit.    BP 132/82 mmHg  Pulse 84  Ht 5\' 11"  (1.803 m)  Wt 260 lb (117.935 kg)  BMI 36.28 kg/m2  SpO2 95% General: NAD, obese Neck: Thick, JVP 8-9 cm, no thyromegaly or thyroid nodule.  Lungs: Clear to auscultation bilaterally with normal respiratory effort. CV:  Nondisplaced PMI.  Heart regular S1/S2 with paradoxical S2 split, no S3/S4, no murmur.  1+ edema to knees bilaterally.  No carotid bruit.  Normal pedal pulses.  Abdomen: Soft, nontender, no hepatosplenomegaly, no distention.  Skin: Intact without lesions or rashes.  Neurologic: Alert and oriented x 3.  Psych: Normal affect. Extremities: No clubbing or cyanosis.   Assessment/Plan: 1. Cardiomyopathy: Nonischemic.  Low EF x years. Echo today was reviewed and showed stable EF 25-30% with septal-lateral dyssynchrony.  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  Interestingly, it also appears that he has a long-standing LBBB.  A chronic LBBB itself can potentially cause a cardiomyopathy and may be the source of his low EF.   He now clearly has at least NYHA II symptoms and is volume overloaded on exam.  - Continue Coreg, he is at goal dose. - He has not been on ACEI or spironolactone due to AKI and hyperkalemia.  K was 4.5 on last BMET.  I am going to try him on lisinopril 2.5 mg daily.  He will need BMET/BNP done today and repeated in 2 wks.  - He has torsemide 10 mg tabs at home to use prn.  I would like him to take the torsemide 10 mg daily.  As above, will get BMET in 2 wks.  - We discussed low sodium diet and 2 L/day fluid restriction. - Justin Hill qualifies for CRT-D.  He has been very reticent to get this in the past.  We had a long talk today about the indications for and potential benefit from CRT-D.  He wants to think some more about it.  2. Hyperlipidemia: Patient is on simvastatin with good lipids in 4/15.  3. CKD: BMET today.  4. Atrial flutter: s/p ablation, in NSR today.  5. Atrial fibrillation: Patient had afib noted as well as flutter.  Therefore, I think that he should stay on Xarelto long-term.  We discussed this today.  Will get CBC.    Followup CHF clinic in 6 wks.   Loralie Champagne 05/18/2015

## 2015-05-18 NOTE — Telephone Encounter (Signed)
New message     Pt has blood in urine, going to PCP at 2:45 this afternoon.  Also checking on blood work results from yesterday. Pt on xarelto.  Please advise

## 2015-05-18 NOTE — Telephone Encounter (Signed)
Patient called back to report that PCP thinks patient may be passing kidney stone. Would like for him to HOLD Xarelto over the weekend and push fluids to try and pass stone. Restart Xarelto either Sunday pm or Monday.  Reviewed with Dr. Angelena Form and he advised agreement with this plan. Notified patient. He verbalized understanding and agreement to keep office updated with status and to restart his Xarelto by Monday.

## 2015-05-18 NOTE — Telephone Encounter (Signed)
Patient notified of lab results. Patient verbalized understanding and agreement to continue with current treatment plan. Patient had called to report brown colored urine this morning (?blood). Has appt with Dr. Humphrey Rolls at [facility] today at 2:45 pm. Requested lab results be faxed to MD so that lab work will not have to be repeated, since just done yesterday. Faxed to Dr. Humphrey Rolls at [phone]. Patient will call back later today to update status after her MD visit.  Will also route this note to Dr. Aundra Dubin.

## 2015-05-24 DIAGNOSIS — R310 Gross hematuria: Secondary | ICD-10-CM | POA: Diagnosis not present

## 2015-06-05 DIAGNOSIS — R310 Gross hematuria: Secondary | ICD-10-CM | POA: Diagnosis not present

## 2015-06-05 DIAGNOSIS — I429 Cardiomyopathy, unspecified: Secondary | ICD-10-CM | POA: Diagnosis not present

## 2015-06-12 ENCOUNTER — Encounter: Payer: Self-pay | Admitting: Cardiology

## 2015-06-20 DIAGNOSIS — I110 Hypertensive heart disease with heart failure: Secondary | ICD-10-CM | POA: Diagnosis not present

## 2015-06-20 DIAGNOSIS — N183 Chronic kidney disease, stage 3 (moderate): Secondary | ICD-10-CM | POA: Diagnosis not present

## 2015-06-20 DIAGNOSIS — R310 Gross hematuria: Secondary | ICD-10-CM | POA: Diagnosis not present

## 2015-07-23 ENCOUNTER — Encounter: Payer: Self-pay | Admitting: Podiatry

## 2015-07-23 ENCOUNTER — Ambulatory Visit (INDEPENDENT_AMBULATORY_CARE_PROVIDER_SITE_OTHER): Payer: Medicare Other | Admitting: Podiatry

## 2015-07-23 DIAGNOSIS — E1140 Type 2 diabetes mellitus with diabetic neuropathy, unspecified: Secondary | ICD-10-CM

## 2015-07-23 DIAGNOSIS — M79676 Pain in unspecified toe(s): Secondary | ICD-10-CM

## 2015-07-23 DIAGNOSIS — B351 Tinea unguium: Secondary | ICD-10-CM

## 2015-07-23 NOTE — Progress Notes (Signed)
Patient ID: Justin Hill, male   DOB: 1941-02-09, 74 y.o.   MRN: [id number] Complaint:  Visit Type: Patient returns to my office for continued preventative foot care services. Complaint: Patient states" my nails have grown long and thick and become painful to walk and wear shoes" Patient has been diagnosed with DM with no foot complications. The patient presents for preventative foot care services. No changes to ROS  Podiatric Exam: Vascular: dorsalis pedis and posterior tibial pulses are palpable bilateral. Capillary return is immediate. Temperature gradient is WNL. Skin turgor WNL  Sensorium: Diminished Semmes Weinstein monofilament test. Normal tactile sensation bilaterally. Nail Exam: Pt has thick disfigured discolored nails with subungual debris noted bilateral entire nail hallux through fifth toenails Ulcer Exam: There is no evidence of ulcer or pre-ulcerative changes or infection. Orthopedic Exam: Muscle tone and strength are WNL. No limitations in general ROM. No crepitus or effusions noted. Foot type and digits show no abnormalities. Bony prominences are unremarkable. Skin: No Porokeratosis. No infection or ulcers  Diagnosis:  Onychomycosis, , Pain in right toe, pain in left toes  Treatment & Plan Procedures and Treatment: Consent by patient was obtained for treatment procedures. The patient understood the discussion of treatment and procedures well. All questions were answered thoroughly reviewed. Debridement of mycotic and hypertrophic toenails, 1 through 5 bilateral and clearing of subungual debris. No ulceration, no infection noted.  Return Visit-Office Procedure: Patient instructed to return to the office for a follow up visit 3 months for continued evaluation and treatment.

## 2015-08-06 ENCOUNTER — Ambulatory Visit: Payer: Medicare Other | Admitting: Endocrinology

## 2015-08-08 DIAGNOSIS — Z1329 Encounter for screening for other suspected endocrine disorder: Secondary | ICD-10-CM | POA: Diagnosis not present

## 2015-08-08 DIAGNOSIS — R310 Gross hematuria: Secondary | ICD-10-CM | POA: Diagnosis not present

## 2015-08-08 DIAGNOSIS — E119 Type 2 diabetes mellitus without complications: Secondary | ICD-10-CM | POA: Diagnosis not present

## 2015-08-13 ENCOUNTER — Ambulatory Visit (INDEPENDENT_AMBULATORY_CARE_PROVIDER_SITE_OTHER): Payer: Medicare Other | Admitting: Endocrinology

## 2015-08-13 ENCOUNTER — Encounter: Payer: Self-pay | Admitting: Endocrinology

## 2015-08-13 VITALS — BP 142/87 | HR 105 | Temp 98.4°F | Ht 71.0 in | Wt 260.0 lb

## 2015-08-13 DIAGNOSIS — E1121 Type 2 diabetes mellitus with diabetic nephropathy: Secondary | ICD-10-CM | POA: Diagnosis not present

## 2015-08-13 DIAGNOSIS — E1122 Type 2 diabetes mellitus with diabetic chronic kidney disease: Secondary | ICD-10-CM | POA: Diagnosis not present

## 2015-08-13 DIAGNOSIS — I5022 Chronic systolic (congestive) heart failure: Secondary | ICD-10-CM | POA: Diagnosis not present

## 2015-08-13 DIAGNOSIS — Z23 Encounter for immunization: Secondary | ICD-10-CM | POA: Diagnosis not present

## 2015-08-13 DIAGNOSIS — I110 Hypertensive heart disease with heart failure: Secondary | ICD-10-CM | POA: Diagnosis not present

## 2015-08-13 DIAGNOSIS — Z794 Long term (current) use of insulin: Secondary | ICD-10-CM | POA: Diagnosis not present

## 2015-08-13 DIAGNOSIS — N183 Chronic kidney disease, stage 3 (moderate): Secondary | ICD-10-CM | POA: Diagnosis not present

## 2015-08-13 DIAGNOSIS — Z0000 Encounter for general adult medical examination without abnormal findings: Secondary | ICD-10-CM | POA: Diagnosis not present

## 2015-08-13 NOTE — Patient Instructions (Addendum)
Please continue the same insulin: 85 units each morning (50 if you are going to be active), and 40 units in the evening.   On this type of insulin schedule, you should eat meals on a regular schedule.  If a meal is missed or significantly delayed, your blood sugar could go low.   check your blood sugar twice a day.  vary the time of day when you check, between before the 3 meals, and at bedtime.  also check if you have symptoms of your blood sugar being too high or too low.  please keep a record of the readings and bring it to your next appointment here.  please call us sooner if your blood sugar goes below 70, or if you have a lot of readings over 200.   If you have any blood sugar under 80, please write on the paper why you think that was.   Please come back for a follow-up appointment in 4 months.

## 2015-08-13 NOTE — Progress Notes (Signed)
Subjective:    Patient ID: Justin Hill, male    DOB: November 27, 1940, 74 y.o.   MRN: [id number]  HPI Pt returns for f/u of diabetes mellitus: DM type: Insulin-requiring type 2. Dx'ed: 123456 Complications: CHF, toe ulcer, partial toe amputation, and renal insufficiency.  Therapy: insulin since 2012.  DKA: never.   Severe hypoglycemia: never.   Pancreatitis: never.  Other: he takes human insulin for cost reasons; in October of 2014, he was changed from multiple daily injections to NPH only, due to persistently high a1c.   Interval history: no cbg record, but states cbg's are well-controlled.  I asked wife, who says pt seldom (1 episode per 3 months) has hypoglycemia, and these are mild.   Past Medical History  Diagnosis Date  . Cardiomyopathy   . Personal history of kidney stones   . Cardiomyopathy   . CHF (congestive heart failure)   . Diabetes mellitus   . Hypercholesterolemia   . Hypertension   . Diverticulosis   . A-fib     Past Surgical History  Procedure Laterality Date  . Shoulder surgery  1981    left. Muscle reattachment  . Tonsillectomy    . Toe surgery Right 12/2012    2nd toe  . Colonoscopy N/A 12/29/2013    Procedure: COLONOSCOPY;  Surgeon: Lafayette Dragon, MD;  Location: [facility] ENDOSCOPY;  Service: Endoscopy;  Laterality: N/A;  . Atrial flutter ablation N/A 01/19/2015    Procedure: ATRIAL FLUTTER ABLATION;  Surgeon: [name], MD;  Location: [facility] CATH LAB;  Service: Cardiovascular;  Laterality: N/A;    Social History   Social History  . Marital Status: Married    Spouse Name: Rosemarie Beath  . Number of Children: 1  . Years of Education: 12   Occupational History  . retired    Social History Main Topics  . Smoking status: Former Smoker    Types: Cigarettes  . Smokeless tobacco: Never Used  . Alcohol Use: No  . Drug Use: No  . Sexual Activity: Not on file   Other Topics Concern  . Not on file   Social History Narrative   Regular exercise-no           Current Outpatient Prescriptions on File Prior to Visit  Medication Sig Dispense Refill  . Ascorbic Acid (VITAMIN C) 500 MG CAPS Take 1 capsule by mouth 2 (two) times daily.     . Bilberry, Vaccinium myrtillus, (BILBERRY EXTRACT PO) Take 1,000 mg by mouth daily.     . carvedilol (COREG) 25 MG tablet TAKE ONE TABLET BY MOUTH TWICE DAILY WITH MEALS 180 tablet 1  . CINNAMON PO Take 1 tablet by mouth every evening.     . diphenhydramine-acetaminophen (TYLENOL PM) 25-500 MG TABS Take 2 tablets by mouth at bedtime as needed (sleep).     . insulin NPH Human (HUMULIN N) 100 UNIT/ML injection 85 units each morning, and 40 units each evening, and syringes 2/day 40 mL 11  . losartan (COZAAR) 25 MG tablet     . Multiple Vitamin (MULTIVITAMIN) tablet Take 1 tablet by mouth daily.      . rivaroxaban (XARELTO) 20 MG TABS tablet Take 1 tablet (20 mg total) by mouth daily with supper. 30 tablet 10  . simvastatin (ZOCOR) 40 MG tablet Take 40 mg by mouth at bedtime.      . torsemide (DEMADEX) 10 MG tablet Take 1 tablet (10 mg total) by mouth daily. 30 tablet 1  . triamcinolone cream (KENALOG)  0.1 %     . lisinopril (PRINIVIL,ZESTRIL) 2.5 MG tablet Take 1 tablet (2.5 mg total) by mouth daily. (Patient not taking: Reported on 08/13/2015) 30 tablet 1   No current facility-administered medications on file prior to visit.    Allergies  Allergen Reactions  . Aspirin Other (See Comments)    Burps, burning, stomach pains, etc  . Penicillins Other (See Comments)    syncope    Family History  Problem Relation Age of Onset  . Heart failure Maternal Grandmother   . Diabetes type II Sister 40  . Hyperlipidemia Other     Parent  . Stroke Other     Parent  . Hypertension Other     Parent  . Diabetes Other     Parent  . Colon cancer Neg Hx     BP 142/87 mmHg  Pulse 105  Temp(Src) 98.4 F (36.9 C) (Oral)  Ht 5\' 11"  (1.803 m)  Wt 260 lb (117.935 kg)  BMI 36.28 kg/m2  SpO2 97%  Review of  Systems Denies LOC.      Objective:   Physical Exam VITAL SIGNS:  See vs page GENERAL: no distress EXTEMITIES: no deformity, except for hammer toes on the left foot. no ulcer on the feet. feet are of normal temp. 1+ bilat leg edema. There is bilateral onychomycosis, rust-colored discoloration, and varicosities.  PULSES: dorsalis pedis intact bilat.  NEURO: sensation is intact to touch on the feet.    outside test results are reviewed: A1c=7.4%    Assessment & Plan:  DM: this is the best control this pt should aim for, given this regimen, which does match insulin to his changing needs throughout the day  Patient is advised the following: Patient Instructions  Please continue the same insulin: 85 units each morning (50 if you are going to be active), and 40 units in the evening.   On this type of insulin schedule, you should eat meals on a regular schedule.  If a meal is missed or significantly delayed, your blood sugar could go low.   check your blood sugar twice a day.  vary the time of day when you check, between before the 3 meals, and at bedtime.  also check if you have symptoms of your blood sugar being too high or too low.  please keep a record of the readings and bring it to your next appointment here.  please call us sooner if your blood sugar goes below 70, or if you have a lot of readings over 200.   If you have any blood sugar under 80, please write on the paper why you think that was.   Please come back for a follow-up appointment in 4 months.

## 2015-09-10 DIAGNOSIS — N183 Chronic kidney disease, stage 3 (moderate): Secondary | ICD-10-CM | POA: Diagnosis not present

## 2015-09-17 DIAGNOSIS — E119 Type 2 diabetes mellitus without complications: Secondary | ICD-10-CM | POA: Diagnosis not present

## 2015-10-10 ENCOUNTER — Encounter (HOSPITAL_COMMUNITY): Payer: Self-pay

## 2015-10-10 ENCOUNTER — Ambulatory Visit (HOSPITAL_COMMUNITY)
Admission: RE | Admit: 2015-10-10 | Discharge: 2015-10-10 | Disposition: A | Discharge status: Home / Self Care | Payer: Medicare Other | Source: Ambulatory Visit | Attending: Cardiology | Admitting: Cardiology

## 2015-10-10 VITALS — BP 130/76 | HR 86 | Wt 259.8 lb

## 2015-10-10 DIAGNOSIS — E1142 Type 2 diabetes mellitus with diabetic polyneuropathy: Secondary | ICD-10-CM | POA: Insufficient documentation

## 2015-10-10 DIAGNOSIS — E669 Obesity, unspecified: Secondary | ICD-10-CM | POA: Insufficient documentation

## 2015-10-10 DIAGNOSIS — Z79899 Other long term (current) drug therapy: Secondary | ICD-10-CM | POA: Diagnosis not present

## 2015-10-10 DIAGNOSIS — N189 Chronic kidney disease, unspecified: Secondary | ICD-10-CM | POA: Insufficient documentation

## 2015-10-10 DIAGNOSIS — Z7902 Long term (current) use of antithrombotics/antiplatelets: Secondary | ICD-10-CM | POA: Diagnosis not present

## 2015-10-10 DIAGNOSIS — I428 Other cardiomyopathies: Secondary | ICD-10-CM | POA: Insufficient documentation

## 2015-10-10 DIAGNOSIS — I4892 Unspecified atrial flutter: Secondary | ICD-10-CM | POA: Insufficient documentation

## 2015-10-10 DIAGNOSIS — I4891 Unspecified atrial fibrillation: Secondary | ICD-10-CM | POA: Diagnosis not present

## 2015-10-10 DIAGNOSIS — E1122 Type 2 diabetes mellitus with diabetic chronic kidney disease: Secondary | ICD-10-CM | POA: Diagnosis not present

## 2015-10-10 DIAGNOSIS — Z794 Long term (current) use of insulin: Secondary | ICD-10-CM | POA: Insufficient documentation

## 2015-10-10 DIAGNOSIS — Z87891 Personal history of nicotine dependence: Secondary | ICD-10-CM | POA: Diagnosis not present

## 2015-10-10 DIAGNOSIS — I5022 Chronic systolic (congestive) heart failure: Secondary | ICD-10-CM

## 2015-10-10 DIAGNOSIS — I129 Hypertensive chronic kidney disease with stage 1 through stage 4 chronic kidney disease, or unspecified chronic kidney disease: Secondary | ICD-10-CM | POA: Diagnosis not present

## 2015-10-10 DIAGNOSIS — Z823 Family history of stroke: Secondary | ICD-10-CM | POA: Diagnosis not present

## 2015-10-10 DIAGNOSIS — E785 Hyperlipidemia, unspecified: Secondary | ICD-10-CM | POA: Diagnosis not present

## 2015-10-10 DIAGNOSIS — I447 Left bundle-branch block, unspecified: Secondary | ICD-10-CM | POA: Insufficient documentation

## 2015-10-10 LAB — BASIC METABOLIC PANEL
Anion gap: 10 (ref 5–15)
BUN: 17 mg/dL (ref 6–20)
CALCIUM: 9.4 mg/dL (ref 8.9–10.3)
CO2: 25 mmol/L (ref 22–32)
CREATININE: 1.33 mg/dL — AB (ref 0.61–1.24)
Chloride: 103 mmol/L (ref 101–111)
GFR calc non Af Amer: 52 mL/min — ABNORMAL LOW (ref >60)
Glucose, Bld: 176 mg/dL — ABNORMAL HIGH (ref 65–99)
Potassium: 4.4 mmol/L (ref 3.5–5.1)
Sodium: 138 mmol/L (ref 135–145)

## 2015-10-10 LAB — BRAIN NATRIURETIC PEPTIDE: B Natriuretic Peptide: 76.2 pg/mL (ref 0.0–100.0)

## 2015-10-10 MED ORDER — SACUBITRIL-VALSARTAN 24-26 MG PO TABS: 1.0000 | ORAL_TABLET | Freq: Two times a day (BID) | ORAL | Status: DC

## 2015-10-10 NOTE — Progress Notes (Signed)
Medication Samples have been provided to the patient.  Drug name: Delene Loll 75/26  Qty: 4 bottles  LOT: [id number]  Exp.Date: March 2017  The patient has been instructed regarding the correct time, dose, and frequency of taking this medication, including desired effects and most common side effects.   Justin Hill 4:14 PM 10/10/2015

## 2015-10-10 NOTE — Patient Instructions (Addendum)
Stop Losartan  Start Entresto 24/26 mg Twice daily   Take Torsemide 10 mg DAILY  Labs today  Labs in 2 weeks  Your physician has recommended that you have a cardiopulmonary stress test (CPX). CPX testing is a non-invasive measurement of heart and lung function. It replaces a traditional treadmill stress test. This type of test provides a tremendous amount of information that relates not only to your present condition but also for future outcomes. This test combines measurements of you ventilation, respiratory gas exchange in the lungs, electrocardiogram (EKG), blood pressure and physical response before, during, and following an exercise protocol.  Your physician recommends that you schedule a follow-up appointment in: 1 month

## 2015-10-11 ENCOUNTER — Telehealth (HOSPITAL_COMMUNITY): Payer: Self-pay | Admitting: Pharmacist

## 2015-10-11 DIAGNOSIS — L820 Inflamed seborrheic keratosis: Secondary | ICD-10-CM | POA: Diagnosis not present

## 2015-10-11 DIAGNOSIS — L304 Erythema intertrigo: Secondary | ICD-10-CM | POA: Diagnosis not present

## 2015-10-11 DIAGNOSIS — L219 Seborrheic dermatitis, unspecified: Secondary | ICD-10-CM | POA: Diagnosis not present

## 2015-10-11 DIAGNOSIS — L299 Pruritus, unspecified: Secondary | ICD-10-CM | POA: Diagnosis not present

## 2015-10-11 NOTE — Telephone Encounter (Signed)
Entresto 24-26 mg PA approved through [employer] until 10/10/2016.   Ruta Hinds. Velva Harman, PharmD, BCPS, CPP Clinical Pharmacist Pager: [phone] Phone: [phone] 10/11/2015 4:35 PM

## 2015-10-11 NOTE — Progress Notes (Signed)
Patient ID: Justin Hill, male   DOB: 1941/10/30, 74 y.o.   MRN: [id number] PCP: Dr. Camillia Herter Cardiology: Dr. Aundra Dubin  74 yo with history CKD, HTN, diabetes, chronic LBBB, and nonischemic cardiomyopathy presents for cardiology followup.  His cardiomyopathy has been known for years.  Most recent echo was done today and showed stable EF 25-30% with septal-lateral dyssynchrony.  He had a LHC in 2008 showing mild nonobstructive CAD.     Mr Deblock was admitted in 2/16 with atrial flutter degenerating into atrial fibrillation.  He was initially cardioverted, then atrial flutter recurred.  He had atrial flutter ablation by Dr Rayann Heman in 2/16.  He thinks that he has been in NSR since that time, no further palpitations.   Patient generally does well. No chest pain.  He was short of breath while walking a couple of times earlier this week, but there is no definite trigger for this.  He is able to mow and weed-eat with no problems.  No lightheadedness.  No orthopnea/PND.  In the past, he has denied symptoms and refused CRT-D.  He is on Xarelto now with no melena or BRBPR.  He is not taking his torsemide regularly.  Labs (5/14): K 4, creatinine 1.4, LFTs normal, LDL 46, HDL 37 Labs (1/15): K 4.4, creatinine 1.46, LDL 74, HDL 35 Labs (4/15); LDL 96, HDL 44, K 5.2, creatinine 1.4 Labs (1/16): K 4.9, creatinine 1.36 Labs (2/16): K 4.5, creatinine 1.56, BNP 412 Labs (9/160: K 4.8, creatinine 1.33, hgb 14.7  ECG: NSR, LBBB with QRS 168 msec  PMH: 1. Type II diabetes with peripheral neuropathy.  2. CKD 3. Hyperlipidemia 4. HTN: ACEI cough.  5. Nonischemic cardiomyopathy: This was diagnosed prior to 2008.  In 2008, patient had LHC with only mild nonobstructive coronary disease.  Echo in 1/14 showed EF 25-30% with normal RV.  Patient has not tolerated ACEI due to hyperkalemia and AKI. ICD was discussed (Dr Lovena Le) and decided against given NYHA class I symptoms.  Echo (7/15) with EF 25-30%, septal-lateral  dyssynchrony, mild LV dilation. Echo (6/16) with EF 25-30%, mild LVH, septal-lateral dyssynchrony, normal RV size and systolic function.  6. Chronic LBBB 7. Obesity 8. Atrial flutter and atrial fibrillation: S/p atrial flutter ablation in 2/16 ([facility]).   SH: Lives in [place], married, prior smoker, no ETOH.  1 child.   FH: Grandmother with CHF.  Mother with CVA.   ROS: All systems reviewed and negative except as per HPI.    Current Outpatient Prescriptions  Medication Sig Dispense Refill  . Ascorbic Acid (VITAMIN C) 500 MG CAPS Take 1 capsule by mouth 2 (two) times daily.     . Bilberry, Vaccinium myrtillus, (BILBERRY EXTRACT PO) Take 1,000 mg by mouth daily.     . carvedilol (COREG) 25 MG tablet TAKE ONE TABLET BY MOUTH TWICE DAILY WITH MEALS 180 tablet 1  . CINNAMON PO Take 1 tablet by mouth every evening.     . diphenhydramine-acetaminophen (TYLENOL PM) 25-500 MG TABS Take 2 tablets by mouth at bedtime as needed (sleep).     . insulin NPH Human (HUMULIN N) 100 UNIT/ML injection 85 units each morning, and 40 units each evening, and syringes 2/day 40 mL 11  . Multiple Vitamin (MULTIVITAMIN) tablet Take 1 tablet by mouth daily.      . rivaroxaban (XARELTO) 20 MG TABS tablet Take 1 tablet (20 mg total) by mouth daily with supper. 30 tablet 10  . simvastatin (ZOCOR) 40 MG tablet Take 40 mg by  mouth at bedtime.      . torsemide (DEMADEX) 10 MG tablet Take 1 tablet (10 mg total) by mouth daily. 30 tablet 1  . triamcinolone cream (KENALOG) 0.1 %     . sacubitril-valsartan (ENTRESTO) 24-26 MG Take 1 tablet by mouth 2 (two) times daily. 60 tablet 6   No current facility-administered medications for this encounter.    BP 130/76 mmHg  Pulse 86  Wt 259 lb 12 oz (117.822 kg)  SpO2 97% General: NAD, obese Neck: Thick, JVP 7-8 cm, no thyromegaly or thyroid nodule.  Lungs: Clear to auscultation bilaterally with normal respiratory effort. CV: Nondisplaced PMI.  Heart regular S1/S2 with  paradoxical S2 split, no S3/S4, no murmur.  1+ edema 1/3 to knees bilaterally.  No carotid bruit.  Normal pedal pulses.  Abdomen: Soft, nontender, no hepatosplenomegaly, no distention.  Skin: Intact without lesions or rashes.  Neurologic: Alert and oriented x 3.  Psych: Normal affect. Extremities: No clubbing or cyanosis.   Assessment/Plan: 1. Cardiomyopathy: Nonischemic.  Low EF x years. Echo 6/16 showed stable EF 25-30% with septal-lateral dyssynchrony.  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  Interestingly, it also appears that he has a long-standing LBBB.  A chronic LBBB itself can potentially cause a cardiomyopathy and may be the source of his low EF.   He now clearly has at least NYHA II symptoms and has some volume overload on exam (mild).   - Continue Coreg, he is at goal dose. - Stop losartan, start Entresto 24/26 bid.  BMET/BNP today and again in 2 wks.  - Next step will be spironolactone.  - I asked him to take his torsemide 10 daily rather than prn.   - We discussed low sodium diet and 2 L/day fluid restriction. - Mr Lafary qualifies for CRT-D.  He has been very reticent to get this in the past.  We had a long talk today about the indications for and potential benefit from CRT-D.   - He is willing to get a CPX done.  Will do this and assess his functional capacity. 2. Hyperlipidemia: Patient is on simvastatin with good lipids in 9/16.  3. CKD: BMET today.  4. Atrial flutter: s/p ablation, in NSR today.  5. Atrial fibrillation: Patient had afib noted as well as flutter.  Therefore, I think that he should stay on Xarelto long-term.     Followup CHF clinic in 3 months.   Loralie Champagne 10/11/2015

## 2015-10-15 ENCOUNTER — Ambulatory Visit: Payer: Medicare Other | Admitting: Podiatry

## 2015-10-22 ENCOUNTER — Ambulatory Visit (INDEPENDENT_AMBULATORY_CARE_PROVIDER_SITE_OTHER): Payer: Medicare Other | Admitting: Podiatry

## 2015-10-22 DIAGNOSIS — B351 Tinea unguium: Secondary | ICD-10-CM | POA: Diagnosis not present

## 2015-10-22 DIAGNOSIS — M79676 Pain in unspecified toe(s): Secondary | ICD-10-CM

## 2015-10-22 DIAGNOSIS — E1140 Type 2 diabetes mellitus with diabetic neuropathy, unspecified: Secondary | ICD-10-CM

## 2015-10-22 NOTE — Progress Notes (Signed)
Patient ID: Justin Hill, male   DOB: 05-12-1941, 74 y.o.   MRN: [id number] Complaint:  Visit Type: Patient returns to my office for continued preventative foot care services. Complaint: Patient states" my nails have grown long and thick and become painful to walk and wear shoes" Patient has been diagnosed with DM with no foot complications. The patient presents for preventative foot care services. No changes to ROS  Podiatric Exam: Vascular: dorsalis pedis and posterior tibial pulses are palpable bilateral. Capillary return is immediate. Temperature gradient is WNL. Skin turgor WNL  Sensorium: Diminished Semmes Weinstein monofilament test. Normal tactile sensation bilaterally. Nail Exam: Pt has thick disfigured discolored nails with subungual debris noted bilateral entire nail hallux through fifth toenails Ulcer Exam: There is no evidence of ulcer or pre-ulcerative changes or infection. Orthopedic Exam: Muscle tone and strength are WNL. No limitations in general ROM. No crepitus or effusions noted. Foot type and digits show no abnormalities. Bony prominences are unremarkable. Skin: No Porokeratosis. No infection or ulcers  Diagnosis:  Onychomycosis, , Pain in right toe, pain in left toes  Treatment & Plan Procedures and Treatment: Consent by patient was obtained for treatment procedures. The patient understood the discussion of treatment and procedures well. All questions were answered thoroughly reviewed. Debridement of mycotic and hypertrophic toenails, 1 through 5 bilateral and clearing of subungual debris. No ulceration, no infection noted.  Return Visit-Office Procedure: Patient instructed to return to the office for a follow up visit 3 months for continued evaluation and treatment.  Gardiner Barefoot DPM

## 2015-10-24 ENCOUNTER — Ambulatory Visit: Payer: Medicare Other | Admitting: Podiatry

## 2015-10-24 ENCOUNTER — Ambulatory Visit (HOSPITAL_COMMUNITY): Payer: Medicare Other

## 2015-10-24 ENCOUNTER — Ambulatory Visit (HOSPITAL_COMMUNITY)
Admission: RE | Admit: 2015-10-24 | Discharge: 2015-10-24 | Disposition: A | Discharge status: Home / Self Care | Payer: Medicare Other | Source: Ambulatory Visit | Attending: Cardiology | Admitting: Cardiology

## 2015-10-24 ENCOUNTER — Ambulatory Visit: Payer: Medicare Other | Admitting: Sports Medicine

## 2015-10-24 DIAGNOSIS — I5022 Chronic systolic (congestive) heart failure: Secondary | ICD-10-CM | POA: Insufficient documentation

## 2015-10-24 LAB — BASIC METABOLIC PANEL
Anion gap: 10 (ref 5–15)
BUN: 20 mg/dL (ref 6–20)
CHLORIDE: 104 mmol/L (ref 101–111)
CO2: 25 mmol/L (ref 22–32)
CREATININE: 1.37 mg/dL — AB (ref 0.61–1.24)
Calcium: 9.1 mg/dL (ref 8.9–10.3)
GFR calc Af Amer: 58 mL/min — ABNORMAL LOW (ref >60)
GFR calc non Af Amer: 50 mL/min — ABNORMAL LOW (ref >60)
GLUCOSE: 113 mg/dL — AB (ref 65–99)
POTASSIUM: 4.3 mmol/L (ref 3.5–5.1)
SODIUM: 139 mmol/L (ref 135–145)

## 2015-10-24 LAB — BRAIN NATRIURETIC PEPTIDE: B Natriuretic Peptide: 52.4 pg/mL (ref 0.0–100.0)

## 2015-11-08 DIAGNOSIS — L299 Pruritus, unspecified: Secondary | ICD-10-CM | POA: Diagnosis not present

## 2015-11-08 DIAGNOSIS — L304 Erythema intertrigo: Secondary | ICD-10-CM | POA: Diagnosis not present

## 2015-11-09 ENCOUNTER — Other Ambulatory Visit: Payer: Self-pay | Admitting: Cardiology

## 2015-11-13 ENCOUNTER — Ambulatory Visit (HOSPITAL_COMMUNITY)
Admission: RE | Admit: 2015-11-13 | Discharge: 2015-11-13 | Disposition: A | Discharge status: Home / Self Care | Payer: Medicare Other | Source: Ambulatory Visit | Attending: Cardiology | Admitting: Cardiology

## 2015-11-13 VITALS — BP 142/75 | HR 75 | Wt 259.1 lb

## 2015-11-13 DIAGNOSIS — I4892 Unspecified atrial flutter: Secondary | ICD-10-CM | POA: Diagnosis not present

## 2015-11-13 DIAGNOSIS — I429 Cardiomyopathy, unspecified: Secondary | ICD-10-CM | POA: Insufficient documentation

## 2015-11-13 DIAGNOSIS — I5022 Chronic systolic (congestive) heart failure: Secondary | ICD-10-CM | POA: Diagnosis not present

## 2015-11-13 DIAGNOSIS — E785 Hyperlipidemia, unspecified: Secondary | ICD-10-CM | POA: Insufficient documentation

## 2015-11-13 DIAGNOSIS — N189 Chronic kidney disease, unspecified: Secondary | ICD-10-CM | POA: Diagnosis not present

## 2015-11-13 DIAGNOSIS — I4891 Unspecified atrial fibrillation: Secondary | ICD-10-CM

## 2015-11-13 MED ORDER — SACUBITRIL-VALSARTAN 49-51 MG PO TABS: 1.0000 | ORAL_TABLET | Freq: Two times a day (BID) | ORAL | Status: DC

## 2015-11-13 NOTE — Patient Instructions (Signed)
Increase Entresto to 49/51 mg Twice daily   Labs in 2 weeks at [place] recommends that you schedule a follow-up appointment in: 6 weeks

## 2015-11-13 NOTE — Progress Notes (Signed)
Patient ID: Justin Hill, male   DOB: 24-May-1941, 74 y.o.   MRN: [id number] PCP: Dr. Camillia Herter Cardiology: Dr. Aundra Dubin  74 yo with history CKD, HTN, diabetes, chronic LBBB, and nonischemic cardiomyopathy presents for cardiology followup.  His cardiomyopathy has been known for years.  Most recent echo was done today and showed stable EF 25-30% with septal-lateral dyssynchrony.  He had a LHC in 2008 showing mild nonobstructive CAD.     [name] was admitted in 2/16 with atrial flutter degenerating into atrial fibrillation.  He was initially cardioverted, then atrial flutter recurred.  He had atrial flutter ablation by Dr Rayann Heman in 2/16.  He thinks that he has been in NSR since that time, no further palpitations.   He is on Xarelto now with no melena or BRBPR.  Noi chest pain.  He is short of breath with heavy activity such as weed-eating and other yardwork. No orthopnea/PND.  CPX showed mild functional limitation due to heart failure, but also limited due to restrictive lung function from body habitus.  He has been taking his torsemide every other day.  Labs (5/14): K 4, creatinine 1.4, LFTs normal, LDL 46, HDL 37 Labs (1/15): K 4.4, creatinine 1.46, LDL 74, HDL 35 Labs (4/15); LDL 96, HDL 44, K 5.2, creatinine 1.4 Labs (1/16): K 4.9, creatinine 1.36 Labs (2/16): K 4.5, creatinine 1.56, BNP 412 Labs (9/16): K 4.8, creatinine 1.33, hgb 14.7 Labs (11/16): K 4.3, creatinine 1.37, BNP 52  ECG: NSR, LBBB with QRS 168 msec  PMH: 1. Type II diabetes with peripheral neuropathy.  2. CKD 3. Hyperlipidemia 4. HTN: ACEI cough.  5. Nonischemic cardiomyopathy: This was diagnosed prior to 2008.  In 2008, patient had LHC with only mild nonobstructive coronary disease.  Echo in 1/14 showed EF 25-30% with normal RV.  Patient has not tolerated ACEI due to hyperkalemia and AKI. ICD was discussed (Dr Lovena Le) and decided against given NYHA class I symptoms.  Echo (7/15) with EF 25-30%, septal-lateral  dyssynchrony, mild LV dilation. Echo (6/16) with EF 25-30%, mild LVH, septal-lateral dyssynchrony, normal RV size and systolic function. CPX (12/16) with peak VO2 14.5 (69% predicted), VE/VCO2 34, RER 1.19 => mild functional impairment from heart failure, restrictive lung function likely due to body habitus.  6. Chronic LBBB 7. Obesity 8. Atrial flutter and atrial fibrillation: S/p atrial flutter ablation in 2/16 ([facility]).   SH: Lives in [place], married, prior smoker, no ETOH.  1 child.   FH: Grandmother with CHF.  Mother with CVA.   ROS: All systems reviewed and negative except as per HPI.    Current Outpatient Prescriptions  Medication Sig Dispense Refill  . Ascorbic Acid (VITAMIN C) 500 MG CAPS Take 1 capsule by mouth 2 (two) times daily.     . Bilberry, Vaccinium myrtillus, (BILBERRY EXTRACT PO) Take 1,000 mg by mouth daily.     . carvedilol (COREG) 25 MG tablet TAKE ONE TABLET BY MOUTH TWICE DAILY WITH MEALS 180 tablet 0  . CINNAMON PO Take 1 tablet by mouth every evening. Reported on 11/13/2015    . diphenhydramine-acetaminophen (TYLENOL PM) 25-500 MG TABS Take 2 tablets by mouth at bedtime as needed (sleep).     . insulin NPH Human (HUMULIN N) 100 UNIT/ML injection 85 units each morning, and 40 units each evening, and syringes 2/day 40 mL 11  . Multiple Vitamin (MULTIVITAMIN) tablet Take 1 tablet by mouth daily.      . rivaroxaban (XARELTO) 20 MG TABS tablet Take 1 tablet (  20 mg total) by mouth daily with supper. 30 tablet 10  . simvastatin (ZOCOR) 40 MG tablet Take 40 mg by mouth at bedtime.      . torsemide (DEMADEX) 10 MG tablet Take 1 tablet (10 mg total) by mouth daily. 30 tablet 1  . triamcinolone cream (KENALOG) 0.1 %     . sacubitril-valsartan (ENTRESTO) 49-51 MG Take 1 tablet by mouth 2 (two) times daily. 60 tablet 3   No current facility-administered medications for this encounter.    BP 142/75 mmHg  Pulse 75  Wt 259 lb 1.9 oz (117.536 kg)  SpO2 99% General: NAD,  obese Neck: Thick, JVP 7 cm, no thyromegaly or thyroid nodule.  Lungs: Clear to auscultation bilaterally with normal respiratory effort. CV: Nondisplaced PMI.  Heart regular S1/S2 with paradoxical S2 split, no S3/S4, no murmur.  1+ ankle edema bilaterally.  No carotid bruit.  Normal pedal pulses.  Abdomen: Soft, nontender, no hepatosplenomegaly, no distention.  Skin: Intact without lesions or rashes.  Neurologic: Alert and oriented x 3.  Psych: Normal affect. Extremities: No clubbing or cyanosis.   Assessment/Plan: 1. Cardiomyopathy: Nonischemic.  Low EF x years. Echo 6/16 showed stable EF 25-30% with septal-lateral dyssynchrony.  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  Interestingly, it also appears that he has a long-standing LBBB.  A chronic LBBB itself can potentially cause a cardiomyopathy and may be the source of his low EF.   He has NYHA class II symptoms, not significantly volume overloaded on exam.  CPX showed a mild functional limitation from heart failure.  He is also limited by restrictive PFTs likely related to body habitus.   - Continue Coreg, he is at goal dose. - Increase Entresto to 49/51 bid.  BMET in 2 wks.  - At next appointment, I will add spironolactone.  - Continue torsemide 10 mg every other day.   - We discussed low sodium diet and 2 L/day fluid restriction. - Mr Umansky qualifies for CRT-D.  We discussed CRT-D again, this time in light of recent CPX showing at least a mild heart failure limitation.  He is still not interested in a device.   2. Hyperlipidemia: Patient is on simvastatin with good lipids in 9/16.  3. CKD: BMET 2 wks on higher Entresto.  4. Atrial flutter: s/p ablation, in NSR today.  5. Atrial fibrillation: Patient had afib noted as well as flutter.  Therefore, I think that he should stay on Xarelto long-term.     Followup CHF clinic in 6 wks.   Loralie Champagne 11/13/2015

## 2015-12-05 DIAGNOSIS — G629 Polyneuropathy, unspecified: Secondary | ICD-10-CM | POA: Diagnosis not present

## 2015-12-05 DIAGNOSIS — Z79899 Other long term (current) drug therapy: Secondary | ICD-10-CM | POA: Diagnosis not present

## 2015-12-10 DIAGNOSIS — E1122 Type 2 diabetes mellitus with diabetic chronic kidney disease: Secondary | ICD-10-CM | POA: Diagnosis not present

## 2015-12-10 DIAGNOSIS — I5022 Chronic systolic (congestive) heart failure: Secondary | ICD-10-CM | POA: Diagnosis not present

## 2015-12-10 DIAGNOSIS — Z794 Long term (current) use of insulin: Secondary | ICD-10-CM | POA: Diagnosis not present

## 2015-12-10 DIAGNOSIS — N183 Chronic kidney disease, stage 3 (moderate): Secondary | ICD-10-CM | POA: Diagnosis not present

## 2015-12-11 DIAGNOSIS — L300 Nummular dermatitis: Secondary | ICD-10-CM | POA: Diagnosis not present

## 2015-12-11 DIAGNOSIS — L299 Pruritus, unspecified: Secondary | ICD-10-CM | POA: Diagnosis not present

## 2015-12-14 ENCOUNTER — Ambulatory Visit: Payer: Medicare Other | Admitting: Endocrinology

## 2015-12-14 ENCOUNTER — Other Ambulatory Visit (HOSPITAL_COMMUNITY): Payer: Self-pay | Admitting: Cardiology

## 2015-12-21 ENCOUNTER — Other Ambulatory Visit: Payer: Self-pay | Admitting: Cardiology

## 2015-12-24 DIAGNOSIS — L300 Nummular dermatitis: Secondary | ICD-10-CM | POA: Diagnosis not present

## 2015-12-24 DIAGNOSIS — L299 Pruritus, unspecified: Secondary | ICD-10-CM | POA: Diagnosis not present

## 2015-12-25 ENCOUNTER — Ambulatory Visit: Payer: Medicare Other | Admitting: Endocrinology

## 2015-12-28 ENCOUNTER — Encounter: Payer: Self-pay | Admitting: Endocrinology

## 2015-12-28 ENCOUNTER — Ambulatory Visit (INDEPENDENT_AMBULATORY_CARE_PROVIDER_SITE_OTHER): Payer: Medicare Other | Admitting: Endocrinology

## 2015-12-28 ENCOUNTER — Ambulatory Visit (HOSPITAL_COMMUNITY)
Admission: RE | Admit: 2015-12-28 | Discharge: 2015-12-28 | Disposition: A | Discharge status: Home / Self Care | Payer: Medicare Other | Source: Ambulatory Visit | Attending: Cardiology | Admitting: Cardiology

## 2015-12-28 VITALS — BP 137/72 | HR 87 | Ht 71.0 in | Wt 261.4 lb

## 2015-12-28 VITALS — BP 138/84 | HR 83 | Temp 98.0°F | Ht 71.0 in | Wt 259.0 lb

## 2015-12-28 DIAGNOSIS — I129 Hypertensive chronic kidney disease with stage 1 through stage 4 chronic kidney disease, or unspecified chronic kidney disease: Secondary | ICD-10-CM | POA: Diagnosis not present

## 2015-12-28 DIAGNOSIS — I447 Left bundle-branch block, unspecified: Secondary | ICD-10-CM | POA: Diagnosis not present

## 2015-12-28 DIAGNOSIS — I4892 Unspecified atrial flutter: Secondary | ICD-10-CM | POA: Insufficient documentation

## 2015-12-28 DIAGNOSIS — N189 Chronic kidney disease, unspecified: Secondary | ICD-10-CM | POA: Diagnosis not present

## 2015-12-28 DIAGNOSIS — E785 Hyperlipidemia, unspecified: Secondary | ICD-10-CM | POA: Diagnosis not present

## 2015-12-28 DIAGNOSIS — Z7901 Long term (current) use of anticoagulants: Secondary | ICD-10-CM | POA: Diagnosis not present

## 2015-12-28 DIAGNOSIS — I4891 Unspecified atrial fibrillation: Secondary | ICD-10-CM | POA: Diagnosis not present

## 2015-12-28 DIAGNOSIS — E1122 Type 2 diabetes mellitus with diabetic chronic kidney disease: Secondary | ICD-10-CM | POA: Insufficient documentation

## 2015-12-28 DIAGNOSIS — E1121 Type 2 diabetes mellitus with diabetic nephropathy: Secondary | ICD-10-CM | POA: Diagnosis not present

## 2015-12-28 DIAGNOSIS — I10 Essential (primary) hypertension: Secondary | ICD-10-CM | POA: Insufficient documentation

## 2015-12-28 DIAGNOSIS — Z79899 Other long term (current) drug therapy: Secondary | ICD-10-CM | POA: Diagnosis not present

## 2015-12-28 DIAGNOSIS — I5022 Chronic systolic (congestive) heart failure: Secondary | ICD-10-CM | POA: Insufficient documentation

## 2015-12-28 DIAGNOSIS — Z794 Long term (current) use of insulin: Secondary | ICD-10-CM | POA: Diagnosis not present

## 2015-12-28 DIAGNOSIS — I429 Cardiomyopathy, unspecified: Secondary | ICD-10-CM | POA: Diagnosis not present

## 2015-12-28 LAB — BASIC METABOLIC PANEL
Anion gap: 9 (ref 5–15)
BUN: 13 mg/dL (ref 6–20)
CALCIUM: 9.6 mg/dL (ref 8.9–10.3)
CO2: 30 mmol/L (ref 22–32)
CREATININE: 1.35 mg/dL — AB (ref 0.61–1.24)
Chloride: 103 mmol/L (ref 101–111)
GFR calc Af Amer: 59 mL/min — ABNORMAL LOW (ref >60)
GFR, EST NON AFRICAN AMERICAN: 51 mL/min — AB (ref >60)
GLUCOSE: 158 mg/dL — AB (ref 65–99)
Potassium: 4.9 mmol/L (ref 3.5–5.1)
Sodium: 142 mmol/L (ref 135–145)

## 2015-12-28 MED ORDER — INSULIN NPH (HUMAN) (ISOPHANE) 100 UNIT/ML ~~LOC~~ SUSP: 85.0000 [IU] | SUBCUTANEOUS | Status: DC

## 2015-12-28 MED ORDER — INSULIN NPH ISOPHANE & REGULAR (70-30) 100 UNIT/ML ~~LOC~~ SUSP: 40.0000 [IU] | Freq: Every day | SUBCUTANEOUS | Status: DC

## 2015-12-28 NOTE — Progress Notes (Signed)
Subjective:    Patient ID: Justin Hill, male    DOB: [birth year], 75 y.o.   MRN: [id number]  HPI Pt returns for f/u of diabetes mellitus: DM type: Insulin-requiring type 2. Dx'ed: 123456 Complications: CHF, toe ulcer, partial toe amputation, and renal insufficiency.  Therapy: insulin since 2012.  DKA: never.   Severe hypoglycemia: never.   Pancreatitis: never.  Other: he takes human insulin for cost reasons; in October of 2014, he was changed from multiple daily injections to NPH only, due to persistently high a1c.   Interval history:  i asked wife, who says pt got a steroid injection 2 mos ago.  she brings a record of her cbg's which i have reviewed today.  It varies from 66-200, but most are in the low to mid-100's. It is lowest in am.   Past Medical History  Diagnosis Date  . Cardiomyopathy   . Personal history of kidney stones   . Cardiomyopathy   . CHF (congestive heart failure) ([place])   . Diabetes mellitus   . Hypercholesterolemia   . Hypertension   . Diverticulosis   . A-fib [facility])     Past Surgical History  Procedure Laterality Date  . Shoulder surgery  1981    left. Muscle reattachment  . Tonsillectomy    . Toe surgery Right 12/2012    2nd toe  . Colonoscopy N/A 12/29/2013    Procedure: COLONOSCOPY;  Surgeon: Lafayette Dragon, MD;  Location: [facility] ENDOSCOPY;  Service: Endoscopy;  Laterality: N/A;  . Atrial flutter ablation N/A 01/19/2015    Procedure: ATRIAL FLUTTER ABLATION;  Surgeon: [name], MD;  Location: [facility] CATH LAB;  Service: Cardiovascular;  Laterality: N/A;    Social History   Social History  . Marital Status: Married    Spouse Name: Rosemarie Beath  . Number of Children: 1  . Years of Education: 12   Occupational History  . retired    Social History Main Topics  . Smoking status: Former Smoker    Types: Cigarettes  . Smokeless tobacco: Never Used  . Alcohol Use: No  . Drug Use: No  . Sexual Activity: Not on file   Other Topics Concern  . Not on  file   Social History Narrative   Regular exercise-no          Current Outpatient Prescriptions on File Prior to Visit  Medication Sig Dispense Refill  . Ascorbic Acid (VITAMIN C) 500 MG CAPS Take 1 capsule by mouth 2 (two) times daily.     . Bilberry, Vaccinium myrtillus, (BILBERRY EXTRACT PO) Take 1,000 mg by mouth daily.     . carvedilol (COREG) 25 MG tablet TAKE ONE TABLET BY MOUTH TWICE DAILY WITH MEALS 180 tablet 0  . CINNAMON PO Take 1 tablet by mouth every evening. Reported on 11/13/2015    . diphenhydramine-acetaminophen (TYLENOL PM) 25-500 MG TABS Take 2 tablets by mouth at bedtime as needed (sleep).     . folic acid (FOLVITE) 1 MG tablet Take 1 mg by mouth daily.    . methotrexate 2.5 MG tablet Take 2.5 mg by mouth 3 (three) times a week.    . Multiple Vitamin (MULTIVITAMIN) tablet Take 1 tablet by mouth daily.      . rivaroxaban (XARELTO) 20 MG TABS tablet Take 1 tablet (20 mg total) by mouth daily with supper. 30 tablet 10  . sacubitril-valsartan (ENTRESTO) 49-51 MG Take 1 tablet by mouth 2 (two) times daily. 60 tablet 3  . simvastatin (  ZOCOR) 40 MG tablet Take 40 mg by mouth at bedtime.      . tamsulosin (FLOMAX) 0.4 MG CAPS capsule Take 0.4 mg by mouth.    . torsemide (DEMADEX) 10 MG tablet TAKE ONE TABLET BY MOUTH ONCE DAILY 30 tablet 0  . triamcinolone cream (KENALOG) 0.1 %      No current facility-administered medications on file prior to visit.    Allergies  Allergen Reactions  . Aspirin Other (See Comments)    Burps, burning, stomach pains, etc  . Penicillins Other (See Comments)    syncope  . Lisinopril Cough    Family History  Problem Relation Age of Onset  . Heart failure Maternal Grandmother   . Diabetes type II Sister 28  . Hyperlipidemia Other     Parent  . Stroke Other     Parent  . Hypertension Other     Parent  . Diabetes Other     Parent  . Colon cancer Neg Hx     BP 138/84 mmHg  Pulse 83  Temp(Src) 98 F (36.7 C) (Oral)  Ht 5\' 11"   (1.803 m)  Wt 259 lb (117.482 kg)  BMI 36.14 kg/m2  SpO2 94%  Review of Systems denies LOC    Objective:   Physical Exam VITAL SIGNS:  See vs page GENERAL: no distress EXTEMITIES: no deformity, except for hammer toes on the left foot. feet are of normal temp. SKIN: no ulcer on the feet.   CV: 1+ bilat leg edema. There is bilateral onychomycosis, rust-colored discoloration, and varicosities.  PULSES: dorsalis pedis intact bilat.  NEURO: sensation is intact to touch on the feet.    outside test results are reviewed: A1c=8.5% (12/11/15)    Assessment & Plan:  DM: The pattern of his cbg's indicates he needs some adjustment in his therapy.  Specifically, he needs mor insulin in the evening, and less overnight.   Patient is advised the following: Patient Instructions  Please continue the same morning insulin: 85 units of NPH (50 if you are going to be active) In the evening, take 40 units of "70/30."  You should take this with supper, rather than at bedtime.   On this type of insulin schedule, you should eat meals on a regular schedule.  If a meal is missed or significantly delayed, your blood sugar could go low.   check your blood sugar twice a day.  vary the time of day when you check, between before the 3 meals, and at bedtime.  also check if you have symptoms of your blood sugar being too high or too low.  please keep a record of the readings and bring it to your next appointment here.  please call us sooner if your blood sugar goes below 70, or if you have a lot of readings over 200.   If you have any blood sugar under 80, please write on the paper why you think that was.   Please come back for a follow-up appointment in 2 months.

## 2015-12-28 NOTE — Progress Notes (Signed)
Patient ID: Justin Hill, male   DOB: 1941-03-20, 75 y.o.   MRN: [id number] PCP: Justin. Camillia Hill Cardiology: Justin. Aundra Hill  75 yo with history CKD, HTN, diabetes, chronic LBBB, and nonischemic cardiomyopathy presents for cardiology followup.  His cardiomyopathy has been known for years.  Most recent echo was done today and showed stable EF 25-30% with septal-lateral dyssynchrony.  He had a LHC in 2008 showing mild nonobstructive CAD.     Justin Hill was admitted in 2/16 with atrial flutter degenerating into atrial fibrillation.  He was initially cardioverted, then atrial flutter recurred.  He had atrial flutter ablation by Justin Hill in 2/16.  He thinks that he has been in NSR since that time, no further palpitations.   He is on Xarelto now with no melena or BRBPR.  No chest pain.  He is short of breath with heavy activity. OK walking up a flight of steps.  No orthopnea/PND.  CPX showed mild functional limitation due to heart failure, but also limited due to restrictive lung function from body habitus.  Weight is up 2 lbs.   Labs (5/14): K 4, creatinine 1.4, LFTs normal, LDL 46, HDL 37 Labs (1/15): K 4.4, creatinine 1.46, LDL 74, HDL 35 Labs (4/15); LDL 96, HDL 44, K 5.2, creatinine 1.4 Labs (1/16): K 4.9, creatinine 1.36 Labs (2/16): K 4.5, creatinine 1.56, BNP 412 Labs (9/16): K 4.8, creatinine 1.33, hgb 14.7 Labs (11/16): K 4.3, creatinine 1.37, BNP 52 Labs (1/17): K 4.5 => 5, creatinine 1.35 => 1.17, HCT 47.4  ECG: NSR, LBBB with QRS 168 msec  PMH: 1. Type II diabetes with peripheral neuropathy.  2. CKD 3. Hyperlipidemia 4. HTN: ACEI cough.  5. Nonischemic cardiomyopathy: This was diagnosed prior to 2008.  In 2008, patient had LHC with only mild nonobstructive coronary disease.  Echo in 1/14 showed EF 25-30% with normal RV.  Patient has not tolerated ACEI due to hyperkalemia and AKI. ICD was discussed (Justin Hill) and decided against given NYHA class I symptoms.  Echo (7/15) with EF  25-30%, septal-lateral dyssynchrony, mild LV dilation. Echo (6/16) with EF 25-30%, mild LVH, septal-lateral dyssynchrony, normal RV size and systolic function. CPX (12/16) with peak VO2 14.5 (69% predicted), VE/VCO2 34, RER 1.19 => mild functional impairment from heart failure, restrictive lung function likely due to body habitus.  6. Chronic LBBB 7. Obesity 8. Atrial flutter and atrial fibrillation: S/p atrial flutter ablation in 2/16 (Justin Hill).   SH: Lives in [place], married, prior smoker, no ETOH.  1 child.   FH: Grandmother with CHF.  Mother with CVA.   ROS: All systems reviewed and negative except as per HPI.    Current Outpatient Prescriptions  Medication Sig Dispense Refill  . Ascorbic Acid (VITAMIN C) 500 MG CAPS Take 1 capsule by mouth 2 (two) times daily.     . Bilberry, Vaccinium myrtillus, (BILBERRY EXTRACT PO) Take 1,000 mg by mouth daily.     . carvedilol (COREG) 25 MG tablet TAKE ONE TABLET BY MOUTH TWICE DAILY WITH MEALS 180 tablet 0  . CINNAMON PO Take 1 tablet by mouth every evening. Reported on 11/13/2015    . diphenhydramine-acetaminophen (TYLENOL PM) 25-500 MG TABS Take 2 tablets by mouth at bedtime as needed (sleep).     . folic acid (FOLVITE) 1 MG tablet Take 1 mg by mouth daily.    . methotrexate 2.5 MG tablet Take 2.5 mg by mouth 3 (three) times a week.    . Multiple Vitamin (MULTIVITAMIN) tablet Take  1 tablet by mouth daily.      . rivaroxaban (XARELTO) 20 MG TABS tablet Take 1 tablet (20 mg total) by mouth daily with supper. 30 tablet 10  . sacubitril-valsartan (ENTRESTO) 49-51 MG Take 1 tablet by mouth 2 (two) times daily. 60 tablet 3  . simvastatin (ZOCOR) 40 MG tablet Take 40 mg by mouth at bedtime.      . tamsulosin (FLOMAX) 0.4 MG CAPS capsule Take 0.4 mg by mouth.    . torsemide (DEMADEX) 10 MG tablet TAKE ONE TABLET BY MOUTH ONCE DAILY 30 tablet 0  . triamcinolone cream (KENALOG) 0.1 %     . insulin NPH Human (HUMULIN N) 100 UNIT/ML injection Inject 0.85  mLs (85 Units total) into the skin every morning. And syringes 2/day 30 mL 11  . insulin NPH-regular Human (NOVOLIN 70/30) (70-30) 100 UNIT/ML injection Inject 40 Units into the skin daily with supper. 20 mL 11   No current facility-administered medications for this encounter.    BP 137/72 mmHg  Pulse 87  Ht 5\' 11"  (1.803 m)  Wt 261 lb 6.4 oz (118.57 kg)  BMI 36.47 kg/m2  SpO2 98% General: NAD, obese Neck: Thick, JVP 7 cm, no thyromegaly or thyroid nodule.  Lungs: Clear to auscultation bilaterally with normal respiratory effort. CV: Nondisplaced PMI.  Heart regular S1/S2 with paradoxical S2 split, no S3/S4, no murmur.  1+ ankle edema bilaterally.  No carotid bruit.  Normal pedal pulses.  Abdomen: Soft, nontender, no hepatosplenomegaly, no distention.  Skin: Intact without lesions or rashes.  Neurologic: Alert and oriented x 3.  Psych: Normal affect. Extremities: No clubbing or cyanosis.   Assessment/Plan: 1. Cardiomyopathy: Nonischemic.  Low EF x years. Echo 6/16 showed stable EF 25-30% with septal-lateral dyssynchrony.  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  Interestingly, it also appears that he has a long-standing LBBB.  A chronic LBBB itself can potentially cause a cardiomyopathy and may be the source of his low EF.   He has NYHA class II symptoms, not significantly volume overloaded on exam.  CPX showed a mild functional limitation from heart failure.  He is also limited by restrictive PFTs likely related to body habitus.   - Continue Coreg, he is at goal dose. - Continue Entresto 49/51 bid.   - I would like to add spironolactone but K recently was 5.  Will repeat K today, if it is not high normal and the last reading was an outlier, will start spirololactone 12.5 daily.  If K remains upper normal, would hold off on spironolactone.  - Continue torsemide 10 mg daily.   - Justin Hill qualifies for CRT-D.  We discussed  CRT-D again, this time in light of recent CPX showing at least a mild heart failure limitation.  He is still not interested in a device.   2. Hyperlipidemia: Patient is on simvastatin with good lipids in 9/16.  3. CKD: BMET today.  4. Atrial flutter: s/p ablation, in NSR today.  5. Atrial fibrillation: Patient had afib noted as well as flutter.  Therefore, I think that he should stay on Xarelto long-term.     Followup CHF clinic in 4 months.   Loralie Champagne 12/28/2015

## 2015-12-28 NOTE — Patient Instructions (Addendum)
Please continue the same morning insulin: 85 units of NPH (50 if you are going to be active) In the evening, take 40 units of "70/30."  You should take this with supper, rather than at bedtime.   On this type of insulin schedule, you should eat meals on a regular schedule.  If a meal is missed or significantly delayed, your blood sugar could go low.   check your blood sugar twice a day.  vary the time of day when you check, between before the 3 meals, and at bedtime.  also check if you have symptoms of your blood sugar being too high or too low.  please keep a record of the readings and bring it to your next appointment here.  please call us sooner if your blood sugar goes below 70, or if you have a lot of readings over 200.   If you have any blood sugar under 80, please write on the paper why you think that was.   Please come back for a follow-up appointment in 2 months.

## 2015-12-28 NOTE — Patient Instructions (Signed)
Labs today  IF labs ok today we will call you and have you start a new medications: Spironolactone 12.5 mg (1/2 tab) daily  Labs in 2 weeks  We will contact you in 4 months to schedule your next appointment.

## 2016-01-01 DIAGNOSIS — L304 Erythema intertrigo: Secondary | ICD-10-CM | POA: Diagnosis not present

## 2016-01-01 DIAGNOSIS — L299 Pruritus, unspecified: Secondary | ICD-10-CM | POA: Diagnosis not present

## 2016-01-07 ENCOUNTER — Telehealth: Payer: Self-pay | Admitting: Endocrinology

## 2016-01-07 NOTE — Telephone Encounter (Signed)
pts wife needs help with knowing what she needs to do he has a sinus cold and he hasn't eaten his sugars have fluctuated today 98 right now. She doesn't know if he should take the insulin now.

## 2016-01-07 NOTE — Telephone Encounter (Signed)
Pt's wife advised of note below and voiced understanding.  

## 2016-01-07 NOTE — Telephone Encounter (Signed)
Please take 20 today, instead of the scheduled 40.

## 2016-01-07 NOTE — Telephone Encounter (Signed)
See note below. Pt is scheduled to take 40 units of novolin 70/30 at supper time, but he does not want to eat.

## 2016-01-08 DIAGNOSIS — J019 Acute sinusitis, unspecified: Secondary | ICD-10-CM | POA: Diagnosis not present

## 2016-01-15 DIAGNOSIS — J189 Pneumonia, unspecified organism: Secondary | ICD-10-CM | POA: Diagnosis not present

## 2016-01-15 DIAGNOSIS — R0602 Shortness of breath: Secondary | ICD-10-CM | POA: Diagnosis not present

## 2016-01-15 DIAGNOSIS — R0600 Dyspnea, unspecified: Secondary | ICD-10-CM | POA: Diagnosis not present

## 2016-01-17 DIAGNOSIS — I5022 Chronic systolic (congestive) heart failure: Secondary | ICD-10-CM | POA: Diagnosis not present

## 2016-01-17 DIAGNOSIS — J189 Pneumonia, unspecified organism: Secondary | ICD-10-CM | POA: Diagnosis not present

## 2016-01-21 ENCOUNTER — Other Ambulatory Visit: Payer: Self-pay | Admitting: Cardiology

## 2016-01-22 DIAGNOSIS — L300 Nummular dermatitis: Secondary | ICD-10-CM | POA: Diagnosis not present

## 2016-01-22 DIAGNOSIS — L304 Erythema intertrigo: Secondary | ICD-10-CM | POA: Diagnosis not present

## 2016-01-28 ENCOUNTER — Encounter: Payer: Self-pay | Admitting: Podiatry

## 2016-01-28 ENCOUNTER — Ambulatory Visit (INDEPENDENT_AMBULATORY_CARE_PROVIDER_SITE_OTHER): Payer: Medicare Other | Admitting: Podiatry

## 2016-01-28 DIAGNOSIS — E1140 Type 2 diabetes mellitus with diabetic neuropathy, unspecified: Secondary | ICD-10-CM

## 2016-01-28 DIAGNOSIS — M79676 Pain in unspecified toe(s): Secondary | ICD-10-CM | POA: Diagnosis not present

## 2016-01-28 DIAGNOSIS — B351 Tinea unguium: Secondary | ICD-10-CM | POA: Diagnosis not present

## 2016-01-28 NOTE — Progress Notes (Signed)
Patient ID: Justin Hill, male   DOB: [birth year], 75 y.o.   MRN: [id number] Complaint:  Visit Type: Patient returns to my office for continued preventative foot care services. Complaint: Patient states" my nails have grown long and thick and become painful to walk and wear shoes" Patient has been diagnosed with DM with no foot complications. The patient presents for preventative foot care services. No changes to ROS  Podiatric Exam: Vascular: dorsalis pedis and posterior tibial pulses are palpable bilateral. Capillary return is immediate. Temperature gradient is WNL. Skin turgor WNL  Sensorium: Diminished Semmes Weinstein monofilament test. Normal tactile sensation bilaterally. Nail Exam: Pt has thick disfigured discolored nails with subungual debris noted bilateral entire nail hallux through fifth toenails Ulcer Exam: There is no evidence of ulcer or pre-ulcerative changes or infection. Orthopedic Exam: Muscle tone and strength are WNL. No limitations in general ROM. No crepitus or effusions noted. Foot type and digits show no abnormalities. Bony prominences are unremarkable. Skin: No Porokeratosis. No infection or ulcers  Diagnosis:  Onychomycosis, , Pain in right toe, pain in left toes  Treatment & Plan Procedures and Treatment: Consent by patient was obtained for treatment procedures. The patient understood the discussion of treatment and procedures well. All questions were answered thoroughly reviewed. Debridement of mycotic and hypertrophic toenails, 1 through 5 bilateral and clearing of subungual debris. No ulceration, no infection noted.  Return Visit-Office Procedure: Patient instructed to return to the office for a follow up visit 3 months for continued evaluation and treatment.  Gardiner Barefoot DPM

## 2016-02-05 ENCOUNTER — Other Ambulatory Visit: Payer: Self-pay | Admitting: Cardiology

## 2016-02-06 ENCOUNTER — Other Ambulatory Visit (HOSPITAL_COMMUNITY): Payer: Self-pay | Admitting: *Deleted

## 2016-02-06 ENCOUNTER — Telehealth: Payer: Self-pay

## 2016-02-06 NOTE — Telephone Encounter (Signed)
Prior auth for Xarelto 20mg  sent to [facility] today.

## 2016-02-07 ENCOUNTER — Encounter: Payer: Self-pay | Admitting: Endocrinology

## 2016-02-08 ENCOUNTER — Telehealth: Payer: Self-pay

## 2016-02-08 NOTE — Telephone Encounter (Signed)
Xarelto approved through 11/23/2016. [id number].

## 2016-02-24 DIAGNOSIS — E119 Type 2 diabetes mellitus without complications: Secondary | ICD-10-CM | POA: Insufficient documentation

## 2016-02-24 NOTE — Patient Instructions (Addendum)
Please continue the same morning insulin: 85 units of NPH (50 if you are going to be active) In the evening, take just 30 units of "70/30."  You should take this with supper, rather than at bedtime.   On this type of insulin schedule, you should eat meals on a regular schedule.  If a meal is missed or significantly delayed, your blood sugar could go low.   check your blood sugar twice a day.  vary the time of day when you check, between before the 3 meals, and at bedtime.  also check if you have symptoms of your blood sugar being too high or too low.  please keep a record of the readings and bring it to your next appointment here.  please call us sooner if your blood sugar goes below 70, or if you have a lot of readings over 200.   If you have any blood sugar under 80, please write on the paper why you think that was.   Please come back for a follow-up appointment in 4 months.

## 2016-02-24 NOTE — Progress Notes (Signed)
Subjective:    Patient ID: Justin Hill, male    DOB: [birth year], 75 y.o.   MRN: [id number]  HPI Pt returns for f/u of diabetes mellitus: DM type: Insulin-requiring type 2. Dx'ed: 123456 Complications: CHF, toe ulcer, partial toe amputation, polyneuropathy, and renal insufficiency.  Therapy: insulin since 2012.  DKA: never.   Severe hypoglycemia: never.   Pancreatitis: never.  Other: he takes human insulin for cost reasons; in October of 2014, he was changed from multiple daily injections to BID, due to persistently high a1c.  The pattern of cbg's indicates he needs NPH in and and 70/30 with dinner.  Interval history:  i asked wife, who brings a record of her cbg's which i have reviewed today.  It varies from 53-300, but most are in the low to mid-100's. It is lowest in am.  No recent steroids.  He has lost a few lbs, due to a recent resp illness.   Past Medical History  Diagnosis Date  . Cardiomyopathy   . Personal history of kidney stones   . Cardiomyopathy   . CHF (congestive heart failure) ([place])   . Diabetes mellitus   . Hypercholesterolemia   . Hypertension   . Diverticulosis   . A-fib [facility])     Past Surgical History  Procedure Laterality Date  . Shoulder surgery  1981    left. Muscle reattachment  . Tonsillectomy    . Toe surgery Right 12/2012    2nd toe  . Colonoscopy N/A 12/29/2013    Procedure: COLONOSCOPY;  Surgeon: Lafayette Dragon, MD;  Location: [facility] ENDOSCOPY;  Service: Endoscopy;  Laterality: N/A;  . Atrial flutter ablation N/A 01/19/2015    Procedure: ATRIAL FLUTTER ABLATION;  Surgeon: [name], MD;  Location: [facility] CATH LAB;  Service: Cardiovascular;  Laterality: N/A;    Social History   Social History  . Marital Status: Married    Spouse Name: Rosemarie Beath  . Number of Children: 1  . Years of Education: 12   Occupational History  . retired    Social History Main Topics  . Smoking status: Former Smoker    Types: Cigarettes  . Smokeless tobacco:  Never Used  . Alcohol Use: No  . Drug Use: No  . Sexual Activity: Not on file   Other Topics Concern  . Not on file   Social History Narrative   Regular exercise-no          Current Outpatient Prescriptions on File Prior to Visit  Medication Sig Dispense Refill  . Ascorbic Acid (VITAMIN C) 500 MG CAPS Take 1 capsule by mouth 2 (two) times daily.     . Bilberry, Vaccinium myrtillus, (BILBERRY EXTRACT PO) Take 1,000 mg by mouth daily.     . carvedilol (COREG) 25 MG tablet TAKE ONE TABLET BY MOUTH TWICE DAILY WITH MEALS 180 tablet 0  . CINNAMON PO Take 1 tablet by mouth every evening. Reported on 11/13/2015    . diphenhydramine-acetaminophen (TYLENOL PM) 25-500 MG TABS Take 2 tablets by mouth at bedtime as needed (sleep).     . folic acid (FOLVITE) 1 MG tablet Take 1 mg by mouth daily.    . methotrexate 2.5 MG tablet Take 2.5 mg by mouth 3 (three) times a week.    . Multiple Vitamin (MULTIVITAMIN) tablet Take 1 tablet by mouth daily.      . sacubitril-valsartan (ENTRESTO) 49-51 MG Take 1 tablet by mouth 2 (two) times daily. 60 tablet 3  . simvastatin (ZOCOR) 40  MG tablet Take 40 mg by mouth at bedtime.      . tamsulosin (FLOMAX) 0.4 MG CAPS capsule Take 0.4 mg by mouth.    . torsemide (DEMADEX) 10 MG tablet TAKE ONE TABLET BY MOUTH ONCE DAILY 30 tablet 0  . triamcinolone cream (KENALOG) 0.1 %     . XARELTO 20 MG TABS tablet TAKE ONE TABLET BY MOUTH ONCE DAILY WITH SUPPER 30 tablet 3   No current facility-administered medications on file prior to visit.    Allergies  Allergen Reactions  . Aspirin Other (See Comments)    Burps, burning, stomach pains, etc  . Penicillins Other (See Comments)    syncope  . Lisinopril Cough    Family History  Problem Relation Age of Onset  . Heart failure Maternal Grandmother   . Diabetes type II Sister 22  . Hyperlipidemia Other     Parent  . Stroke Other     Parent  . Hypertension Other     Parent  . Diabetes Other     Parent  . Colon  cancer Neg Hx     BP 114/70 mmHg  Pulse 73  Temp(Src) 97.9 F (36.6 C)  Resp 14  Ht 5\' 11"  (1.803 m)  Wt 252 lb (114.306 kg)  BMI 35.16 kg/m2  SpO2 97%  Review of Systems Denies LOC    Objective:   Physical Exam VITAL SIGNS: See vs page GENERAL: no distress EXTEMITIES: no deformity, except for hammer toes on the left foot. feet are of normal temp. SKIN: no ulcer on the feet.  CV: 1+ bilat leg edema. There is bilateral onychomycosis, rust-colored discoloration, and varicosities.  PULSES: dorsalis pedis intact bilat.  NEURO: sensation is intact to touch on the feet, but decreased from normal.     A1c=6.4%    Assessment & Plan:  DM: overcontrolled, given this regimen, which does match insulin to his changing needs throughout the day Hypoglycemia, worse.  Weight loss, new to me, prob due to recent illness.  He may regain as he recovers, so I have asked wife to continue to monitor cbg's.    Patient is advised the following: Patient Instructions  Please continue the same morning insulin: 85 units of NPH (50 if you are going to be active) In the evening, take just 30 units of "70/30."  You should take this with supper, rather than at bedtime.   On this type of insulin schedule, you should eat meals on a regular schedule.  If a meal is missed or significantly delayed, your blood sugar could go low.   check your blood sugar twice a day.  vary the time of day when you check, between before the 3 meals, and at bedtime.  also check if you have symptoms of your blood sugar being too high or too low.  please keep a record of the readings and bring it to your next appointment here.  please call us sooner if your blood sugar goes below 70, or if you have a lot of readings over 200.   If you have any blood sugar under 80, please write on the paper why you think that was.   Please come back for a follow-up appointment in 4 months.

## 2016-02-25 ENCOUNTER — Ambulatory Visit (INDEPENDENT_AMBULATORY_CARE_PROVIDER_SITE_OTHER): Payer: Medicare Other | Admitting: Endocrinology

## 2016-02-25 ENCOUNTER — Encounter: Payer: Self-pay | Admitting: Endocrinology

## 2016-02-25 VITALS — BP 114/70 | HR 73 | Temp 97.9°F | Resp 14 | Ht 71.0 in | Wt 252.0 lb

## 2016-02-25 DIAGNOSIS — E1122 Type 2 diabetes mellitus with diabetic chronic kidney disease: Secondary | ICD-10-CM

## 2016-02-25 DIAGNOSIS — N183 Chronic kidney disease, stage 3 (moderate): Secondary | ICD-10-CM | POA: Diagnosis not present

## 2016-02-25 DIAGNOSIS — Z794 Long term (current) use of insulin: Secondary | ICD-10-CM | POA: Diagnosis not present

## 2016-02-25 DIAGNOSIS — E1121 Type 2 diabetes mellitus with diabetic nephropathy: Secondary | ICD-10-CM | POA: Diagnosis not present

## 2016-02-25 LAB — POCT GLYCOSYLATED HEMOGLOBIN (HGB A1C): Hemoglobin A1C: 6.4

## 2016-02-25 LAB — GLUCOSE, POCT (MANUAL RESULT ENTRY): POC Glucose: 142 mg/dl — AB (ref 70–99)

## 2016-02-25 MED ORDER — INSULIN NPH (HUMAN) (ISOPHANE) 100 UNIT/ML ~~LOC~~ SUSP: 85.0000 [IU] | SUBCUTANEOUS | Status: DC

## 2016-02-25 MED ORDER — INSULIN NPH ISOPHANE & REGULAR (70-30) 100 UNIT/ML ~~LOC~~ SUSP: 30.0000 [IU] | Freq: Every day | SUBCUTANEOUS | Status: DC

## 2016-02-26 DIAGNOSIS — L300 Nummular dermatitis: Secondary | ICD-10-CM | POA: Diagnosis not present

## 2016-02-26 DIAGNOSIS — L299 Pruritus, unspecified: Secondary | ICD-10-CM | POA: Diagnosis not present

## 2016-02-27 ENCOUNTER — Other Ambulatory Visit: Payer: Self-pay | Admitting: Cardiology

## 2016-03-05 ENCOUNTER — Other Ambulatory Visit: Payer: Self-pay

## 2016-03-05 MED ORDER — INSULIN NPH ISOPHANE & REGULAR (70-30) 100 UNIT/ML ~~LOC~~ SUSP: 30.0000 [IU] | Freq: Every day | SUBCUTANEOUS | Status: DC

## 2016-03-05 MED ORDER — INSULIN NPH (HUMAN) (ISOPHANE) 100 UNIT/ML ~~LOC~~ SUSP: 85.0000 [IU] | Freq: Every day | SUBCUTANEOUS | Status: DC

## 2016-03-17 DIAGNOSIS — N183 Chronic kidney disease, stage 3 (moderate): Secondary | ICD-10-CM | POA: Diagnosis not present

## 2016-03-17 DIAGNOSIS — E1122 Type 2 diabetes mellitus with diabetic chronic kidney disease: Secondary | ICD-10-CM | POA: Diagnosis not present

## 2016-03-17 DIAGNOSIS — J309 Allergic rhinitis, unspecified: Secondary | ICD-10-CM | POA: Diagnosis not present

## 2016-03-17 DIAGNOSIS — S46811A Strain of other muscles, fascia and tendons at shoulder and upper arm level, right arm, initial encounter: Secondary | ICD-10-CM | POA: Diagnosis not present

## 2016-03-17 DIAGNOSIS — R0982 Postnasal drip: Secondary | ICD-10-CM | POA: Diagnosis not present

## 2016-03-17 DIAGNOSIS — R109 Unspecified abdominal pain: Secondary | ICD-10-CM | POA: Diagnosis not present

## 2016-03-17 DIAGNOSIS — Z794 Long term (current) use of insulin: Secondary | ICD-10-CM | POA: Diagnosis not present

## 2016-04-01 DIAGNOSIS — L299 Pruritus, unspecified: Secondary | ICD-10-CM | POA: Diagnosis not present

## 2016-04-01 DIAGNOSIS — L300 Nummular dermatitis: Secondary | ICD-10-CM | POA: Diagnosis not present

## 2016-04-07 DIAGNOSIS — R531 Weakness: Secondary | ICD-10-CM | POA: Diagnosis not present

## 2016-04-07 DIAGNOSIS — L400 Psoriasis vulgaris: Secondary | ICD-10-CM | POA: Diagnosis not present

## 2016-04-07 DIAGNOSIS — L300 Nummular dermatitis: Secondary | ICD-10-CM | POA: Diagnosis not present

## 2016-04-28 ENCOUNTER — Telehealth (HOSPITAL_COMMUNITY): Payer: Self-pay | Admitting: Vascular Surgery

## 2016-04-28 NOTE — Telephone Encounter (Signed)
Pt wife called she would like the pt to be seen sooner because pt is having some problems, tightness in chest and shoulders.. Please advise

## 2016-04-29 ENCOUNTER — Telehealth (HOSPITAL_COMMUNITY): Payer: Self-pay | Admitting: Vascular Surgery

## 2016-04-29 NOTE — Telephone Encounter (Signed)
Pt wife called pt is having some issues she would like to speak to someone to get a sooner appt than 05/23/16 appt w/ [facility]

## 2016-04-29 NOTE — Telephone Encounter (Signed)
Spoke w/pt's wife, she states pt has having a "heaviness" across his shoulders off/on, when he gets the feeling he has to stop and rest for it to ease off.  He has denied, CP, SOB, dizziness.  She states when she checks VS including CBG are all ok.  Appt sch w/Dr Aundra Dubin tom at 9 am

## 2016-04-30 ENCOUNTER — Encounter (HOSPITAL_COMMUNITY): Payer: Self-pay

## 2016-04-30 ENCOUNTER — Encounter (HOSPITAL_COMMUNITY): Payer: Self-pay | Admitting: *Deleted

## 2016-04-30 ENCOUNTER — Other Ambulatory Visit (HOSPITAL_COMMUNITY): Payer: Self-pay | Admitting: *Deleted

## 2016-04-30 ENCOUNTER — Ambulatory Visit (HOSPITAL_COMMUNITY)
Admission: RE | Admit: 2016-04-30 | Discharge: 2016-04-30 | Disposition: A | Discharge status: Home / Self Care | Payer: Medicare Other | Source: Ambulatory Visit | Attending: Cardiology | Admitting: Cardiology

## 2016-04-30 VITALS — BP 122/78 | HR 84 | Wt 241.5 lb

## 2016-04-30 DIAGNOSIS — E1122 Type 2 diabetes mellitus with diabetic chronic kidney disease: Secondary | ICD-10-CM | POA: Diagnosis not present

## 2016-04-30 DIAGNOSIS — Z794 Long term (current) use of insulin: Secondary | ICD-10-CM | POA: Insufficient documentation

## 2016-04-30 DIAGNOSIS — N189 Chronic kidney disease, unspecified: Secondary | ICD-10-CM | POA: Insufficient documentation

## 2016-04-30 DIAGNOSIS — N183 Chronic kidney disease, stage 3 unspecified: Secondary | ICD-10-CM

## 2016-04-30 DIAGNOSIS — I447 Left bundle-branch block, unspecified: Secondary | ICD-10-CM | POA: Insufficient documentation

## 2016-04-30 DIAGNOSIS — I428 Other cardiomyopathies: Secondary | ICD-10-CM | POA: Insufficient documentation

## 2016-04-30 DIAGNOSIS — E1142 Type 2 diabetes mellitus with diabetic polyneuropathy: Secondary | ICD-10-CM | POA: Diagnosis not present

## 2016-04-30 DIAGNOSIS — I5022 Chronic systolic (congestive) heart failure: Secondary | ICD-10-CM

## 2016-04-30 DIAGNOSIS — E669 Obesity, unspecified: Secondary | ICD-10-CM | POA: Diagnosis not present

## 2016-04-30 DIAGNOSIS — Z87891 Personal history of nicotine dependence: Secondary | ICD-10-CM | POA: Diagnosis not present

## 2016-04-30 DIAGNOSIS — I4891 Unspecified atrial fibrillation: Secondary | ICD-10-CM | POA: Diagnosis not present

## 2016-04-30 DIAGNOSIS — Z7901 Long term (current) use of anticoagulants: Secondary | ICD-10-CM | POA: Insufficient documentation

## 2016-04-30 DIAGNOSIS — Z823 Family history of stroke: Secondary | ICD-10-CM | POA: Insufficient documentation

## 2016-04-30 DIAGNOSIS — I4892 Unspecified atrial flutter: Secondary | ICD-10-CM

## 2016-04-30 DIAGNOSIS — Z79899 Other long term (current) drug therapy: Secondary | ICD-10-CM | POA: Insufficient documentation

## 2016-04-30 DIAGNOSIS — E785 Hyperlipidemia, unspecified: Secondary | ICD-10-CM | POA: Diagnosis not present

## 2016-04-30 DIAGNOSIS — I130 Hypertensive heart and chronic kidney disease with heart failure and stage 1 through stage 4 chronic kidney disease, or unspecified chronic kidney disease: Secondary | ICD-10-CM | POA: Insufficient documentation

## 2016-04-30 LAB — CBC
HEMATOCRIT: 38.8 % — AB (ref 39.0–52.0)
HEMOGLOBIN: 13.2 g/dL (ref 13.0–17.0)
MCH: 32.8 pg (ref 26.0–34.0)
MCHC: 34 g/dL (ref 30.0–36.0)
MCV: 96.5 fL (ref 78.0–100.0)
Platelets: 132 10*3/uL — ABNORMAL LOW (ref 150–400)
RBC: 4.02 MIL/uL — ABNORMAL LOW (ref 4.22–5.81)
RDW: 14.7 % (ref 11.5–15.5)
WBC: 5 10*3/uL (ref 4.0–10.5)

## 2016-04-30 LAB — BASIC METABOLIC PANEL
ANION GAP: 7 (ref 5–15)
BUN: 9 mg/dL (ref 6–20)
CO2: 27 mmol/L (ref 22–32)
Calcium: 9.4 mg/dL (ref 8.9–10.3)
Chloride: 104 mmol/L (ref 101–111)
Creatinine, Ser: 1.17 mg/dL (ref 0.61–1.24)
GFR calc Af Amer: >60 mL/min (ref >60)
GLUCOSE: 126 mg/dL — AB (ref 65–99)
POTASSIUM: 4.9 mmol/L (ref 3.5–5.1)
Sodium: 138 mmol/L (ref 135–145)

## 2016-04-30 LAB — BRAIN NATRIURETIC PEPTIDE: B Natriuretic Peptide: 45.6 pg/mL (ref 0.0–100.0)

## 2016-04-30 LAB — TSH: TSH: 2.437 u[IU]/mL (ref 0.350–4.500)

## 2016-04-30 NOTE — Patient Instructions (Signed)
Labs today  Heart Catheterization on Fri, see instruction sheet  You have been referred to Dr Rayann Heman  Keep follow up as scheduled

## 2016-04-30 NOTE — Progress Notes (Signed)
Medication Samples have been provided to the patient.  Drug name: Delene Loll [id number]       Strength: 49/51        Qty: 4 bottles  LOT: [id number]  Exp.Date: 10/18  Dosing instructions: 1 tab Twice daily   The patient has been instructed regarding the correct time, dose, and frequency of taking this medication, including desired effects and most common side effects.   Nira Conn Dima Mini 12:13 PM 04/30/2016   Medication Samples have been provided to the patient.  Drug name: Xarelto       Strength: 20 mg        Qty: 3 bottles  LOT: [id number]  Exp.Date: 8/19  Dosing instructions: 1 tab daily  The patient has been instructed regarding the correct time, dose, and frequency of taking this medication, including desired effects and most common side effects.    [place] 12:14 PM 04/30/2016

## 2016-04-30 NOTE — Progress Notes (Addendum)
Patient ID: AKHILESH SPARR, male   DOB: 1941/02/09, 75 y.o.   MRN: [id number] PCP: Dr. Camillia Herter Cardiology: Dr. Aundra Dubin  75 yo with history CKD, HTN, diabetes, chronic LBBB, and nonischemic cardiomyopathy presents for cardiology followup.  His cardiomyopathy has been known for years.  Most recent echo was in 6/16 showed stable EF 25-30% with septal-lateral dyssynchrony. He had a LHC in 2008 showing mild nonobstructive CAD.     Mr Rasner was admitted in 2/16 with atrial flutter degenerating into atrial fibrillation.  He was initially cardioverted, then atrial flutter recurred.  He had atrial flutter ablation by Dr Rayann Heman in 2/16.  He thinks that he has been in NSR since that time, no further palpitations.   He is on Xarelto now with no melena or BRBPR.  For the last 2 months, he has been having episodes where he will get heaviness across his shoulder and neck with exertion.  This usually occurs when he is somehow being active such as weedeating or walking around outside.  This has been occurring several times/week.  He denies dyspnea per se.  He has had a poor appetite since an episode of PNA and has now lost 20 lbs.    Labs (5/14): K 4, creatinine 1.4, LFTs normal, LDL 46, HDL 37 Labs (1/15): K 4.4, creatinine 1.46, LDL 74, HDL 35 Labs (4/15); LDL 96, HDL 44, K 5.2, creatinine 1.4 Labs (1/16): K 4.9, creatinine 1.36 Labs (2/16): K 4.5, creatinine 1.56, BNP 412 Labs (9/16): K 4.8, creatinine 1.33, hgb 14.7 Labs (11/16): K 4.3, creatinine 1.37, BNP 52 Labs (1/17): K 4.5 => 5, creatinine 1.35 => 1.17, HCT 47.4 Labs (2/17): K 4.9, creatinine 1.35 Labs (5/17): LDL 48, LFTs normal  ECG: NSR, LBBB with QRS 170 msec  PMH: 1. Type II diabetes with peripheral neuropathy.  2. CKD 3. Hyperlipidemia 4. HTN: ACEI cough.  5. Nonischemic cardiomyopathy: This was diagnosed prior to 2008.  In 2008, patient had LHC with only mild nonobstructive coronary disease.  Echo in 1/14 showed EF 25-30% with normal  RV.  Patient has not tolerated ACEI due to hyperkalemia and AKI. ICD was discussed (Dr Lovena Le) and decided against given NYHA class I symptoms.  Echo (7/15) with EF 25-30%, septal-lateral dyssynchrony, mild LV dilation. Echo (6/16) with EF 25-30%, mild LVH, septal-lateral dyssynchrony, normal RV size and systolic function. CPX (12/16) with peak VO2 14.5 (69% predicted), VE/VCO2 34, RER 1.19 => mild functional impairment from heart failure, restrictive lung function likely due to body habitus.  6. Chronic LBBB 7. Obesity 8. Atrial flutter and atrial fibrillation: S/p atrial flutter ablation in 2/16 ([facility]).   SH: Lives in [place], married, prior smoker, no ETOH.  1 child.   FH: Grandmother with CHF.  Mother with CVA.   ROS: All systems reviewed and negative except as per HPI.    Current Outpatient Prescriptions  Medication Sig Dispense Refill  . Ascorbic Acid (VITAMIN C) 500 MG CAPS Take 1 capsule by mouth 2 (two) times daily.     . Bilberry, Vaccinium myrtillus, (BILBERRY EXTRACT PO) Take 1,000 mg by mouth daily.     . carvedilol (COREG) 25 MG tablet TAKE ONE TABLET BY MOUTH TWICE DAILY WITH MEALS 180 tablet 0  . CINNAMON PO Take 1 tablet by mouth every evening. Reported on 11/13/2015    . diphenhydramine-acetaminophen (TYLENOL PM) 25-500 MG TABS Take 2 tablets by mouth at bedtime as needed (sleep).     . folic acid (FOLVITE) 1 MG tablet Take  1 mg by mouth daily.    . insulin NPH Human (NOVOLIN N) 100 UNIT/ML injection Inject 0.85 mLs (85 Units total) into the skin daily before breakfast. 30 mL 2  . insulin NPH-regular Human (NOVOLIN 70/30) (70-30) 100 UNIT/ML injection Inject 30 Units into the skin daily with supper. 20 mL 11  . methotrexate 2.5 MG tablet Take 2.5 mg by mouth 3 (three) times a week.    . Multiple Vitamin (MULTIVITAMIN) tablet Take 1 tablet by mouth daily.      . sacubitril-valsartan (ENTRESTO) 49-51 MG Take 1 tablet by mouth 2 (two) times daily. 60 tablet 3  . simvastatin  (ZOCOR) 40 MG tablet Take 40 mg by mouth at bedtime.      . tamsulosin (FLOMAX) 0.4 MG CAPS capsule Take 0.4 mg by mouth.    . torsemide (DEMADEX) 10 MG tablet TAKE ONE TABLET BY MOUTH ONCE DAILY 30 tablet 3  . triamcinolone cream (KENALOG) 0.1 %     . XARELTO 20 MG TABS tablet TAKE ONE TABLET BY MOUTH ONCE DAILY WITH SUPPER 30 tablet 3   No current facility-administered medications for this encounter.    BP 122/78 mmHg  Pulse 84  Wt 241 lb 8 oz (109.544 kg)  SpO2 96% General: NAD, obese Neck: Thick, JVP 7 cm, no thyromegaly or thyroid nodule.  Lungs: Clear to auscultation bilaterally with normal respiratory effort. CV: Nondisplaced PMI.  Heart regular S1/S2 with paradoxical S2 split, no S3/S4, no murmur.  1+ ankle edema bilaterally.  No carotid bruit.  Normal pedal pulses.  Abdomen: Soft, nontender, no hepatosplenomegaly, no distention.  Skin: Intact without lesions or rashes.  Neurologic: Alert and oriented x 3.  Psych: Normal affect. Extremities: No clubbing or cyanosis.   Assessment/Plan: 1. Cardiomyopathy: Nonischemic.  Low EF x years. Echo 6/16 showed stable EF 25-30% with septal-lateral dyssynchrony.  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  Interestingly, it also appears that he has a long-standing LBBB.  A chronic LBBB itself can potentially cause a cardiomyopathy and may be the source of his low EF.   He has NYHA class II symptoms, not significantly volume overloaded on exam.  Last CPX showed a mild functional limitation from heart failure.  He is also limited by restrictive PFTs likely related to body habitus.  More recently, he has been having exertional shoulder heaviness concerning for possible ischemia.  He does not appear volume overloaded on exam.   - Continue Coreg, he is at goal dose. - Continue Entresto 49/51 bid.   - I would like to add spironolactone but K has been upper normal to high.  Will check BMET  today, if not high will consider addition of spironolactone 12.5 daily.   - Continue torsemide 10 mg daily.   - Mr Detoro qualifies for CRT-D.  We discussed CRT-D again, he is now interested in having this placed.  Will refer back to Dr Rayann Heman.  - Will plan on coronary angiography (see below) and will get RHC at time of coronary angiography.  2. Hyperlipidemia: Patient is on simvastatin with good lipids in 5/17.  3. CKD: BMET today.  4. Atrial flutter: s/p ablation, in NSR today.  5. Atrial fibrillation: Patient had afib noted as well as flutter.  Therefore, I think that he should stay on Xarelto long-term.    6. Exertional shoulder heaviness: He has been having these exertional symptoms for about 2 months.  It  is worrying him.  I think he needs an ischemic evaluation.  With wide LBBB and body habitus, I think he has a high risk for a false positive Cardiolite.  Therefore, I think that the best choice here is going to be coronary angiography.  We discussed risks/benefits of LHC/RHC, and he agrees to proceed.   Followup CHF clinic 2 wks.   Loralie Champagne 04/30/2016

## 2016-05-02 ENCOUNTER — Ambulatory Visit (HOSPITAL_COMMUNITY)
Admission: RE | Admit: 2016-05-02 | Discharge: 2016-05-02 | Disposition: A | Discharge status: Home / Self Care | Payer: Medicare Other | Source: Ambulatory Visit | Attending: Cardiology | Admitting: Cardiology

## 2016-05-02 ENCOUNTER — Encounter (HOSPITAL_COMMUNITY)
Admission: RE | Disposition: A | Discharge status: Home / Self Care | Payer: Self-pay | Source: Ambulatory Visit | Attending: Cardiology

## 2016-05-02 DIAGNOSIS — I447 Left bundle-branch block, unspecified: Secondary | ICD-10-CM | POA: Insufficient documentation

## 2016-05-02 DIAGNOSIS — E1142 Type 2 diabetes mellitus with diabetic polyneuropathy: Secondary | ICD-10-CM | POA: Diagnosis not present

## 2016-05-02 DIAGNOSIS — I429 Cardiomyopathy, unspecified: Secondary | ICD-10-CM | POA: Diagnosis not present

## 2016-05-02 DIAGNOSIS — Z6833 Body mass index (BMI) 33.0-33.9, adult: Secondary | ICD-10-CM | POA: Diagnosis not present

## 2016-05-02 DIAGNOSIS — I4891 Unspecified atrial fibrillation: Secondary | ICD-10-CM | POA: Insufficient documentation

## 2016-05-02 DIAGNOSIS — E1122 Type 2 diabetes mellitus with diabetic chronic kidney disease: Secondary | ICD-10-CM | POA: Diagnosis not present

## 2016-05-02 DIAGNOSIS — E785 Hyperlipidemia, unspecified: Secondary | ICD-10-CM | POA: Insufficient documentation

## 2016-05-02 DIAGNOSIS — E669 Obesity, unspecified: Secondary | ICD-10-CM | POA: Diagnosis not present

## 2016-05-02 DIAGNOSIS — I4892 Unspecified atrial flutter: Secondary | ICD-10-CM | POA: Insufficient documentation

## 2016-05-02 DIAGNOSIS — N189 Chronic kidney disease, unspecified: Secondary | ICD-10-CM | POA: Diagnosis not present

## 2016-05-02 DIAGNOSIS — I130 Hypertensive heart and chronic kidney disease with heart failure and stage 1 through stage 4 chronic kidney disease, or unspecified chronic kidney disease: Secondary | ICD-10-CM | POA: Insufficient documentation

## 2016-05-02 DIAGNOSIS — Z87891 Personal history of nicotine dependence: Secondary | ICD-10-CM | POA: Diagnosis not present

## 2016-05-02 DIAGNOSIS — I509 Heart failure, unspecified: Secondary | ICD-10-CM | POA: Insufficient documentation

## 2016-05-02 DIAGNOSIS — I5022 Chronic systolic (congestive) heart failure: Secondary | ICD-10-CM

## 2016-05-02 DIAGNOSIS — Z7901 Long term (current) use of anticoagulants: Secondary | ICD-10-CM | POA: Diagnosis not present

## 2016-05-02 DIAGNOSIS — Z794 Long term (current) use of insulin: Secondary | ICD-10-CM | POA: Insufficient documentation

## 2016-05-02 DIAGNOSIS — I2510 Atherosclerotic heart disease of native coronary artery without angina pectoris: Secondary | ICD-10-CM | POA: Diagnosis not present

## 2016-05-02 HISTORY — PX: CARDIAC CATHETERIZATION: SHX172

## 2016-05-02 LAB — POCT I-STAT 3, VENOUS BLOOD GAS (G3P V)
Acid-Base Excess: 2 mmol/L (ref 0.0–2.0)
Acid-Base Excess: 2 mmol/L (ref 0.0–2.0)
Bicarbonate: 28 mEq/L — ABNORMAL HIGH (ref 20.0–24.0)
Bicarbonate: 28.2 mEq/L — ABNORMAL HIGH (ref 20.0–24.0)
O2 SAT: 58 %
O2 SAT: 59 %
PCO2 VEN: 47.4 mmHg (ref 45.0–50.0)
PCO2 VEN: 47.7 mmHg (ref 45.0–50.0)
PH VEN: 7.376 — AB (ref 7.250–7.300)
PH VEN: 7.382 — AB (ref 7.250–7.300)
TCO2: 29 mmol/L (ref 0–100)
TCO2: 30 mmol/L (ref 0–100)
pO2, Ven: 31 mmHg (ref 31.0–45.0)
pO2, Ven: 32 mmHg (ref 31.0–45.0)

## 2016-05-02 LAB — GLUCOSE, CAPILLARY: Glucose-Capillary: 184 mg/dL — ABNORMAL HIGH (ref 65–99)

## 2016-05-02 LAB — POCT ACTIVATED CLOTTING TIME: ACTIVATED CLOTTING TIME: 191 s

## 2016-05-02 SURGERY — RIGHT/LEFT HEART CATH AND CORONARY ANGIOGRAPHY

## 2016-05-02 MED ORDER — SODIUM CHLORIDE 0.9 % IV SOLN: 250.0000 mL | INTRAVENOUS | Status: DC | PRN

## 2016-05-02 MED ORDER — SODIUM CHLORIDE 0.9% FLUSH: 3.0000 mL | INTRAVENOUS | Status: DC | PRN

## 2016-05-02 MED ORDER — IOPAMIDOL (ISOVUE-370) INJECTION 76%
INTRAVENOUS | Status: DC | PRN
  Administered 2016-05-02: 75 mL via INTRA_ARTERIAL

## 2016-05-02 MED ORDER — FENTANYL CITRATE (PF) 100 MCG/2ML IJ SOLN
INTRAMUSCULAR | Status: DC | PRN
Start: 2016-05-02 — End: 2016-05-02
  Administered 2016-05-02: 25 ug via INTRAVENOUS

## 2016-05-02 MED ORDER — HEPARIN (PORCINE) IN NACL 2-0.9 UNIT/ML-% IJ SOLN
INTRAMUSCULAR | Status: DC | PRN
  Administered 2016-05-02: 12:00:00

## 2016-05-02 MED ORDER — HEPARIN SODIUM (PORCINE) 1000 UNIT/ML IJ SOLN
INTRAMUSCULAR | Status: DC | PRN
  Administered 2016-05-02: 5500 [IU] via INTRAVENOUS

## 2016-05-02 MED ORDER — SODIUM CHLORIDE 0.9 % WEIGHT BASED INFUSION: 1.0000 mL/kg/h | INTRAVENOUS | Status: DC

## 2016-05-02 MED ORDER — FENTANYL CITRATE (PF) 100 MCG/2ML IJ SOLN
INTRAMUSCULAR | Status: AC
  Filled 2016-05-02: qty 2

## 2016-05-02 MED ORDER — SODIUM CHLORIDE 0.9% FLUSH: 3.0000 mL | Freq: Two times a day (BID) | INTRAVENOUS | Status: DC

## 2016-05-02 MED ORDER — HEPARIN SODIUM (PORCINE) 1000 UNIT/ML IJ SOLN
INTRAMUSCULAR | Status: AC
  Filled 2016-05-02: qty 1

## 2016-05-02 MED ORDER — LIDOCAINE HCL (PF) 1 % IJ SOLN
INTRAMUSCULAR | Status: AC
  Filled 2016-05-02: qty 30

## 2016-05-02 MED ORDER — ACETAMINOPHEN 325 MG PO TABS: 650.0000 mg | ORAL_TABLET | ORAL | Status: DC | PRN

## 2016-05-02 MED ORDER — MIDAZOLAM HCL 2 MG/2ML IJ SOLN
INTRAMUSCULAR | Status: DC | PRN
  Administered 2016-05-02: 1 mg via INTRAVENOUS

## 2016-05-02 MED ORDER — SODIUM CHLORIDE 0.9 % IV SOLN
INTRAVENOUS | Status: DC
  Administered 2016-05-02: 09:00:00 via INTRAVENOUS

## 2016-05-02 MED ORDER — HEPARIN (PORCINE) IN NACL 2-0.9 UNIT/ML-% IJ SOLN
INTRAMUSCULAR | Status: AC
  Filled 2016-05-02: qty 1000

## 2016-05-02 MED ORDER — VERAPAMIL HCL 2.5 MG/ML IV SOLN
INTRAVENOUS | Status: AC
  Filled 2016-05-02: qty 2

## 2016-05-02 MED ORDER — LIDOCAINE HCL (PF) 1 % IJ SOLN
INTRAMUSCULAR | Status: DC | PRN
  Administered 2016-05-02: 1 mL
  Administered 2016-05-02: 2 mL

## 2016-05-02 MED ORDER — ONDANSETRON HCL 4 MG/2ML IJ SOLN: 4.0000 mg | Freq: Four times a day (QID) | INTRAMUSCULAR | Status: DC | PRN

## 2016-05-02 MED ORDER — MIDAZOLAM HCL 2 MG/2ML IJ SOLN
INTRAMUSCULAR | Status: AC
  Filled 2016-05-02: qty 2

## 2016-05-02 MED ORDER — IOPAMIDOL (ISOVUE-370) INJECTION 76%
INTRAVENOUS | Status: AC
  Filled 2016-05-02: qty 100

## 2016-05-02 SURGICAL SUPPLY — 14 items
CATH BALLN WEDGE 5F 110CM (CATHETERS) ×2 IMPLANT
CATH INFINITI 5 FR JL3.5 (CATHETERS) ×2 IMPLANT
CATH INFINITI 5FR ANG PIGTAIL (CATHETERS) ×2 IMPLANT
CATH INFINITI JR4 5F (CATHETERS) ×2 IMPLANT
DEVICE RAD COMP TR BAND LRG (VASCULAR PRODUCTS) ×2 IMPLANT
GLIDESHEATH SLEND SS 6F .021 (SHEATH) ×2 IMPLANT
KIT HEART LEFT (KITS) ×2 IMPLANT
KIT HEART RIGHT NAMIC (KITS) ×2 IMPLANT
PACK CARDIAC CATHETERIZATION (CUSTOM PROCEDURE TRAY) ×2 IMPLANT
SHEATH FAST CATH BRACH 5F 5CM (SHEATH) ×4 IMPLANT
SYR MEDRAD MARK V 150ML (SYRINGE) ×2 IMPLANT
TRANSDUCER W/STOPCOCK (MISCELLANEOUS) ×2 IMPLANT
TUBING CIL FLEX 10 FLL-RA (TUBING) ×2 IMPLANT
WIRE SAFE-T 1.5MM-J .035X260CM (WIRE) ×2 IMPLANT

## 2016-05-02 NOTE — Discharge Instructions (Signed)
Radial Site Care Refer to this sheet in the next few weeks. These instructions provide you with information about caring for yourself after your procedure. Your health care provider may also give you more specific instructions. Your treatment has been planned according to current medical practices, but problems sometimes occur. Call your health care provider if you have any problems or questions after your procedure. WHAT TO EXPECT AFTER THE PROCEDURE After your procedure, it is typical to have the following:  Bruising at the radial site that usually fades within 1-2 weeks.  Blood collecting in the tissue (hematoma) that may be painful to the touch. It should usually decrease in size and tenderness within 1-2 weeks. HOME CARE INSTRUCTIONS  Take medicines only as directed by your health care provider.  You may shower 24-48 hours after the procedure or as directed by your health care provider. Remove the bandage (dressing) and gently wash the site with plain soap and water. Pat the area dry with a clean towel. Do not rub the site, because this may cause bleeding.  Do not take baths, swim, or use a hot tub until your health care provider approves.  Check your insertion site every day for redness, swelling, or drainage.  Do not apply powder or lotion to the site.  Do not flex or bend the affected arm for 24 hours or as directed by your health care provider.  Do not push or pull heavy objects with the affected arm for 24 hours or as directed by your health care provider.  Do not lift over 10 lb (4.5 kg) for 5 days after your procedure or as directed by your health care provider.  Ask your health care provider when it is okay to:  Return to work or school.  Resume usual physical activities or sports.  Resume sexual activity.  Do not drive home if you are discharged the same day as the procedure. Have someone else drive you.  You may drive 24 hours after the procedure unless otherwise  instructed by your health care provider.  Do not operate machinery or power tools for 24 hours after the procedure.  If your procedure was done as an outpatient procedure, which means that you went home the same day as your procedure, a responsible adult should be with you for the first 24 hours after you arrive home.  Keep all follow-up visits as directed by your health care provider. This is important. SEEK MEDICAL CARE IF:  You have a fever.  You have chills.  You have increased bleeding from the radial site. Hold pressure on the site. SEEK IMMEDIATE MEDICAL CARE IF:  You have unusual pain at the radial site.  You have redness, warmth, or swelling at the radial site.  You have drainage (other than a small amount of blood on the dressing) from the radial site.  The radial site is bleeding, and the bleeding does not stop after 30 minutes of holding steady pressure on the site.  Your arm or hand becomes pale, cool, tingly, or numb.   This information is not intended to replace advice given to you by your health care provider. Make sure you discuss any questions you have with your health care provider.   Document Released: 12/13/2010 Document Revised: 12/01/2014 Document Reviewed: 05/29/2014 Elsevier Interactive Patient Education 2016 [place] Refer to this sheet in the next few weeks. These instructions provide you with information about caring for yourself after your procedure. Your health care provider  may also give you more specific instructions. Your treatment has been planned according to current medical practices, but problems sometimes occur. Call your health care provider if you have any problems or questions after your procedure. WHAT TO EXPECT AFTER THE PROCEDURE After your procedure, it is typical to have the following:  Bruising at the radial site that usually fades within 1-2 weeks.  Blood collecting in the tissue (hematoma) that may be painful  to the touch. It should usually decrease in size and tenderness within 1-2 weeks. HOME CARE INSTRUCTIONS  Take medicines only as directed by your health care provider.  You may shower 24-48 hours after the procedure or as directed by your health care provider. Remove the bandage (dressing) and gently wash the site with plain soap and water. Pat the area dry with a clean towel. Do not rub the site, because this may cause bleeding.  Do not take baths, swim, or use a hot tub until your health care provider approves.  Check your insertion site every day for redness, swelling, or drainage.  Do not apply powder or lotion to the site.  Do not flex or bend the affected arm for 24 hours or as directed by your health care provider.  Do not push or pull heavy objects with the affected arm for 24 hours or as directed by your health care provider.  Do not lift over 10 lb (4.5 kg) for 5 days after your procedure or as directed by your health care provider.  Ask your health care provider when it is okay to:  Return to work or school.  Resume usual physical activities or sports.  Resume sexual activity.  Do not drive home if you are discharged the same day as the procedure. Have someone else drive you.  You may drive 24 hours after the procedure unless otherwise instructed by your health care provider.  Do not operate machinery or power tools for 24 hours after the procedure.  If your procedure was done as an outpatient procedure, which means that you went home the same day as your procedure, a responsible adult should be with you for the first 24 hours after you arrive home.  Keep all follow-up visits as directed by your health care provider. This is important. SEEK MEDICAL CARE IF:  You have a fever.  You have chills.  You have increased bleeding from the radial site. Hold pressure on the site. SEEK IMMEDIATE MEDICAL CARE IF:  You have unusual pain at the radial site.  You have  redness, warmth, or swelling at the radial site.  You have drainage (other than a small amount of blood on the dressing) from the radial site.  The radial site is bleeding, and the bleeding does not stop after 30 minutes of holding steady pressure on the site.  Your arm or hand becomes pale, cool, tingly, or numb.   This information is not intended to replace advice given to you by your health care provider. Make sure you discuss any questions you have with your health care provider.   Document Released: 12/13/2010 Document Revised: 12/01/2014 Document Reviewed: 05/29/2014 Elsevier Interactive Patient Education [employer].

## 2016-05-02 NOTE — H&P (View-Only) (Signed)
Patient ID: Justin Hill, male   DOB: 1941-08-05, 75 y.o.   MRN: [id number] PCP: Dr. Camillia Herter Cardiology: Dr. Aundra Dubin  75 yo with history CKD, HTN, diabetes, chronic LBBB, and nonischemic cardiomyopathy presents for cardiology followup.  His cardiomyopathy has been known for years.  Most recent echo was in 6/16 showed stable EF 25-30% with septal-lateral dyssynchrony. He had a LHC in 2008 showing mild nonobstructive CAD.     Mr Justin Hill was admitted in 2/16 with atrial flutter degenerating into atrial fibrillation.  He was initially cardioverted, then atrial flutter recurred.  He had atrial flutter ablation by Dr Rayann Heman in 2/16.  He thinks that he has been in NSR since that time, no further palpitations.   He is on Xarelto now with no melena or BRBPR.  For the last 2 months, he has been having episodes where he will get heaviness across his shoulder and neck with exertion.  This usually occurs when he is somehow being active such as weedeating or walking around outside.  This has been occurring several times/week.  He denies dyspnea per se.  He has had a poor appetite since an episode of PNA and has now lost 20 lbs.    Labs (5/14): K 4, creatinine 1.4, LFTs normal, LDL 46, HDL 37 Labs (1/15): K 4.4, creatinine 1.46, LDL 74, HDL 35 Labs (4/15); LDL 96, HDL 44, K 5.2, creatinine 1.4 Labs (1/16): K 4.9, creatinine 1.36 Labs (2/16): K 4.5, creatinine 1.56, BNP 412 Labs (9/16): K 4.8, creatinine 1.33, hgb 14.7 Labs (11/16): K 4.3, creatinine 1.37, BNP 52 Labs (1/17): K 4.5 => 5, creatinine 1.35 => 1.17, HCT 47.4 Labs (2/17): K 4.9, creatinine 1.35 Labs (5/17): LDL 48, LFTs normal  ECG: NSR, LBBB with QRS 170 msec  PMH: 1. Type II diabetes with peripheral neuropathy.  2. CKD 3. Hyperlipidemia 4. HTN: ACEI cough.  5. Nonischemic cardiomyopathy: This was diagnosed prior to 2008.  In 2008, patient had LHC with only mild nonobstructive coronary disease.  Echo in 1/14 showed EF 25-30% with normal  RV.  Patient has not tolerated ACEI due to hyperkalemia and AKI. ICD was discussed (Dr Lovena Le) and decided against given NYHA class I symptoms.  Echo (7/15) with EF 25-30%, septal-lateral dyssynchrony, mild LV dilation. Echo (6/16) with EF 25-30%, mild LVH, septal-lateral dyssynchrony, normal RV size and systolic function. CPX (12/16) with peak VO2 14.5 (69% predicted), VE/VCO2 34, RER 1.19 => mild functional impairment from heart failure, restrictive lung function likely due to body habitus.  6. Chronic LBBB 7. Obesity 8. Atrial flutter and atrial fibrillation: S/p atrial flutter ablation in 2/16 ([facility]).   SH: Lives in [place], married, prior smoker, no ETOH.  1 child.   FH: Grandmother with CHF.  Mother with CVA.   ROS: All systems reviewed and negative except as per HPI.    Current Outpatient Prescriptions  Medication Sig Dispense Refill  . Ascorbic Acid (VITAMIN C) 500 MG CAPS Take 1 capsule by mouth 2 (two) times daily.     . Bilberry, Vaccinium myrtillus, (BILBERRY EXTRACT PO) Take 1,000 mg by mouth daily.     . carvedilol (COREG) 25 MG tablet TAKE ONE TABLET BY MOUTH TWICE DAILY WITH MEALS 180 tablet 0  . CINNAMON PO Take 1 tablet by mouth every evening. Reported on 11/13/2015    . diphenhydramine-acetaminophen (TYLENOL PM) 25-500 MG TABS Take 2 tablets by mouth at bedtime as needed (sleep).     . folic acid (FOLVITE) 1 MG tablet Take  1 mg by mouth daily.    . insulin NPH Human (NOVOLIN N) 100 UNIT/ML injection Inject 0.85 mLs (85 Units total) into the skin daily before breakfast. 30 mL 2  . insulin NPH-regular Human (NOVOLIN 70/30) (70-30) 100 UNIT/ML injection Inject 30 Units into the skin daily with supper. 20 mL 11  . methotrexate 2.5 MG tablet Take 2.5 mg by mouth 3 (three) times a week.    . Multiple Vitamin (MULTIVITAMIN) tablet Take 1 tablet by mouth daily.      . sacubitril-valsartan (ENTRESTO) 49-51 MG Take 1 tablet by mouth 2 (two) times daily. 60 tablet 3  . simvastatin  (ZOCOR) 40 MG tablet Take 40 mg by mouth at bedtime.      . tamsulosin (FLOMAX) 0.4 MG CAPS capsule Take 0.4 mg by mouth.    . torsemide (DEMADEX) 10 MG tablet TAKE ONE TABLET BY MOUTH ONCE DAILY 30 tablet 3  . triamcinolone cream (KENALOG) 0.1 %     . XARELTO 20 MG TABS tablet TAKE ONE TABLET BY MOUTH ONCE DAILY WITH SUPPER 30 tablet 3   No current facility-administered medications for this encounter.    BP 122/78 mmHg  Pulse 84  Wt 241 lb 8 oz (109.544 kg)  SpO2 96% General: NAD, obese Neck: Thick, JVP 7 cm, no thyromegaly or thyroid nodule.  Lungs: Clear to auscultation bilaterally with normal respiratory effort. CV: Nondisplaced PMI.  Heart regular S1/S2 with paradoxical S2 split, no S3/S4, no murmur.  1+ ankle edema bilaterally.  No carotid bruit.  Normal pedal pulses.  Abdomen: Soft, nontender, no hepatosplenomegaly, no distention.  Skin: Intact without lesions or rashes.  Neurologic: Alert and oriented x 3.  Psych: Normal affect. Extremities: No clubbing or cyanosis.   Assessment/Plan: 1. Cardiomyopathy: Nonischemic.  Low EF x years. Echo 6/16 showed stable EF 25-30% with septal-lateral dyssynchrony.  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  Interestingly, it also appears that he has a long-standing LBBB.  A chronic LBBB itself can potentially cause a cardiomyopathy and may be the source of his low EF.   He has NYHA class II symptoms, not significantly volume overloaded on exam.  Last CPX showed a mild functional limitation from heart failure.  He is also limited by restrictive PFTs likely related to body habitus.  More recently, he has been having exertional shoulder heaviness concerning for possible ischemia.  He does not appear volume overloaded on exam.   - Continue Coreg, he is at goal dose. - Continue Entresto 49/51 bid.   - I would like to add spironolactone but K has been upper normal to high.  Will check BMET  today, if not high will consider addition of spironolactone 12.5 daily.   - Continue torsemide 10 mg daily.   - Mr Justin Hill qualifies for CRT-D.  We discussed CRT-D again, he is now interested in having this placed.  Will refer back to Dr Rayann Heman.  - Will plan on coronary angiography (see below) and will get RHC at time of coronary angiography.  2. Hyperlipidemia: Patient is on simvastatin with good lipids in 5/17.  3. CKD: BMET today.  4. Atrial flutter: s/p ablation, in NSR today.  5. Atrial fibrillation: Patient had afib noted as well as flutter.  Therefore, I think that he should stay on Xarelto long-term.    6. Exertional shoulder heaviness: He has been having these exertional symptoms for about 2 months.  It  is worrying him.  I think he needs an ischemic evaluation.  With wide LBBB and body habitus, I think he has a high risk for a false positive Cardiolite.  Therefore, I think that the best choice here is going to be coronary angiography.  We discussed risks/benefits of LHC/RHC, and he agrees to proceed.   Followup CHF clinic 2 wks.   Loralie Champagne 04/30/2016

## 2016-05-02 NOTE — Interval H&P Note (Signed)
History and Physical Interval Note:  05/02/2016 10:44 AM  Justin Hill  has presented today for surgery, with the diagnosis of hf - cp  The various methods of treatment have been discussed with the patient and family. After consideration of risks, benefits and other options for treatment, the patient has consented to  Procedure(s): Right/Left Heart Cath and Coronary Angiography (N/A) as a surgical intervention .  The patient's history has been reviewed, patient examined, no change in status, stable for surgery.  I have reviewed the patient's chart and labs.  Questions were answered to the patient's satisfaction.     Kamarri Lovvorn [employer]

## 2016-05-02 NOTE — Telephone Encounter (Signed)
Addressed, cathed today

## 2016-05-05 ENCOUNTER — Encounter (HOSPITAL_COMMUNITY): Payer: Self-pay | Admitting: Cardiology

## 2016-05-05 ENCOUNTER — Ambulatory Visit (INDEPENDENT_AMBULATORY_CARE_PROVIDER_SITE_OTHER): Payer: Medicare Other | Admitting: Podiatry

## 2016-05-05 DIAGNOSIS — B351 Tinea unguium: Secondary | ICD-10-CM

## 2016-05-05 DIAGNOSIS — E1140 Type 2 diabetes mellitus with diabetic neuropathy, unspecified: Secondary | ICD-10-CM

## 2016-05-05 DIAGNOSIS — Q828 Other specified congenital malformations of skin: Secondary | ICD-10-CM

## 2016-05-05 DIAGNOSIS — M79676 Pain in unspecified toe(s): Secondary | ICD-10-CM

## 2016-05-05 MED FILL — Verapamil HCl IV Soln 2.5 MG/ML: INTRAVENOUS | Qty: 2 | Status: AC

## 2016-05-05 NOTE — Progress Notes (Signed)
Patient ID: Justin Hill, male   DOB: [birth year], 75 y.o.   MRN: [id number] Complaint:  Visit Type: Patient returns to my office for continued preventative foot care services. Complaint: Patient states" my nails have grown long and thick and become painful to walk and wear shoes" Patient has been diagnosed with DM with no foot complications. The patient presents for preventative foot care services. No changes to ROS  Podiatric Exam: Vascular: dorsalis pedis and posterior tibial pulses are palpable bilateral. Capillary return is immediate. Temperature gradient is WNL. Skin turgor WNL  Sensorium: Diminished Semmes Weinstein monofilament test. Normal tactile sensation bilaterally. Nail Exam: Pt has thick disfigured discolored nails with subungual debris noted bilateral entire nail hallux through fifth toenails Ulcer Exam: There is no evidence of ulcer or pre-ulcerative changes or infection. Orthopedic Exam: Muscle tone and strength are WNL. No limitations in general ROM. No crepitus or effusions noted. Foot type and digits show no abnormalities. Bony prominences are unremarkable. Skin: No Porokeratosis. No infection or ulcers.  Asymptomatic porokeratosis sub 4th left foot.  Diagnosis:  Onychomycosis, , Pain in right toe, pain in left toes Diabetes with neuropathy  Treatment & Plan Procedures and Treatment: Consent by patient was obtained for treatment procedures. The patient understood the discussion of treatment and procedures well. All questions were answered thoroughly reviewed. Debridement of mycotic and hypertrophic toenails, 1 through 5 bilateral and clearing of subungual debris. No ulceration, no infection noted. Initiate diabetic shoe paperwork. Return Visit-Office Procedure: Patient instructed to return to the office for a follow up visit 3 months for continued evaluation and treatment.  Gardiner Barefoot DPM

## 2016-05-05 NOTE — Addendum Note (Signed)
Addended by: Ezzard Flax, Toshiko Kemler L on: 05/05/2016 09:38 AM   Modules accepted: Medications

## 2016-05-12 ENCOUNTER — Encounter: Payer: Self-pay | Admitting: Internal Medicine

## 2016-05-12 ENCOUNTER — Ambulatory Visit (INDEPENDENT_AMBULATORY_CARE_PROVIDER_SITE_OTHER): Payer: Medicare Other | Admitting: Internal Medicine

## 2016-05-12 VITALS — BP 122/78 | HR 81 | Ht 71.0 in | Wt 244.8 lb

## 2016-05-12 DIAGNOSIS — I5022 Chronic systolic (congestive) heart failure: Secondary | ICD-10-CM | POA: Diagnosis not present

## 2016-05-12 DIAGNOSIS — I429 Cardiomyopathy, unspecified: Secondary | ICD-10-CM

## 2016-05-12 DIAGNOSIS — I447 Left bundle-branch block, unspecified: Secondary | ICD-10-CM

## 2016-05-12 DIAGNOSIS — I4891 Unspecified atrial fibrillation: Secondary | ICD-10-CM

## 2016-05-12 DIAGNOSIS — I428 Other cardiomyopathies: Secondary | ICD-10-CM

## 2016-05-12 NOTE — Addendum Note (Signed)
Addended by: Janan Halter F on: 05/12/2016 04:06 PM   Modules accepted: Orders

## 2016-05-12 NOTE — Patient Instructions (Signed)
Medication Instructions:  Your physician recommends that you continue on your current medications as directed. Please refer to the Current Medication list given to you today.   Labwork: Your physician recommends that you return for lab work on 05/29/16 at 1pm---you do not have to fast    Testing/Procedures: Your physician has recommended that you have a defibrillator inserted. An implantable cardioverter defibrillator (ICD) is a small device that is placed in your chest or, in rare cases, your abdomen. This device uses electrical pulses or shocks to help control life-threatening, irregular heartbeats that could lead the heart to suddenly stop beating (sudden cardiac arrest). Leads are attached to the ICD that goes into your heart. This is done in the hospital and usually requires an overnight stay. Please see the instruction sheet given to you today for more information.  Please arrive at [facility] on 06/05/16 at 8:30am Do not eat or drink after midnight the night prior to the procedure Do not take any medications the morning of the procedure Hold Xarelto for 48 hours prior to the procedure Plan for one night stay      Follow-Up: Your physician recommends that you schedule a follow-up appointment in: 10-14 days from 06/05/16 in device clinic and 3 months from 06/05/16 with Dr Rayann Heman   Any Other Special Instructions Will Be Listed Below (If Applicable).     If you need a refill on your cardiac medications before your next appointment, please call your pharmacy.

## 2016-05-12 NOTE — Progress Notes (Signed)
Primary Care Physician: Cyndy Freeze, MD Cardiologist: Dr. Soundra Pilon Diallo Pawlus is a 75 y.o. male  nonischemic CM, chronic systolic dysfunction,  EF 25-30%, LBBB, diabetes, HTN, and obesity. Justin Hill He is followed closely by Dr Aundra Dubin.  The patient has had worsening CHF symptoms.  He has fatigue and decreased exercise tolerance.  He has been treated with an optimal medical regimen without improvement.  Today, he denies symptoms of palpitations, chest pain, shortness of breath, orthopnea, PND, lower extremity edema, dizziness, presyncope, syncope, or neurologic sequela. The patient is tolerating medications without difficulties and is otherwise without complaint today.   Past Medical History  Diagnosis Date  . Cardiomyopathy   . Personal history of kidney stones   . Cardiomyopathy   . CHF (congestive heart failure)   . Diabetes mellitus   . Hypercholesterolemia   . Hypertension   . Diverticulosis   . A-fib    Past Surgical History  Procedure Laterality Date  . Shoulder surgery  1981    left. Muscle reattachment  . Tonsillectomy    . Toe surgery Right 12/2012    2nd toe  . Colonoscopy N/A 12/29/2013    Procedure: COLONOSCOPY;  Surgeon: Lafayette Dragon, MD;  Location: [facility] ENDOSCOPY;  Service: Endoscopy;  Laterality: N/A;  . Atrial flutter ablation N/A 01/19/2015    Procedure: ATRIAL FLUTTER ABLATION;  Surgeon: [name], MD;  Location: [facility] CATH LAB;  Service: Cardiovascular;  Laterality: N/A;    Current outpatient prescriptions:  .  Ascorbic Acid (VITAMIN C) 500 MG CAPS, Take 1 capsule by mouth 2 (two) times daily. , Disp: , Rfl:  .  Bilberry, Vaccinium myrtillus, (BILBERRY EXTRACT PO), Take 1,000 mg by mouth daily. , Disp: , Rfl:  .  carvedilol (COREG) 25 MG tablet, TAKE ONE TABLET BY MOUTH TWICE DAILY WITH MEALS, Disp: 180 tablet, Rfl: 0 .  CINNAMON PO, Take 1 tablet by mouth every evening. Reported on 11/13/2015, Disp: , Rfl:  .  digoxin (LANOXIN) 0.125 MG tablet, Take by  mouth daily., Disp: , Rfl:  .  diphenhydramine-acetaminophen (TYLENOL PM) 25-500 MG TABS, Take 2 tablets by mouth at bedtime as needed (sleep). , Disp: , Rfl:  .  folic acid (FOLVITE) 1 MG tablet, Take 1 mg by mouth daily., Disp: , Rfl:  .  insulin NPH Human (NOVOLIN N) 100 UNIT/ML injection, Inject 0.85 mLs (85 Units total) into the skin daily before breakfast., Disp: 30 mL, Rfl: 2 .  insulin NPH-regular Human (NOVOLIN 70/30) (70-30) 100 UNIT/ML injection, Inject 30 Units into the skin daily with supper., Disp: 20 mL, Rfl: 11 .  methotrexate 2.5 MG tablet, Take 10 mg by mouth See admin instructions. Take 10 mg every morning & 10 mg every evening ONLY on Wednesday, Disp: , Rfl:  .  Multiple Vitamin (MULTIVITAMIN) tablet, Take 1 tablet by mouth daily.  , Disp: , Rfl:  .  sacubitril-valsartan (ENTRESTO) 49-51 MG, Take 1 tablet by mouth 2 (two) times daily., Disp: 60 tablet, Rfl: 3 .  simvastatin (ZOCOR) 40 MG tablet, Take 40 mg by mouth at bedtime.  , Disp: , Rfl:  .  tamsulosin (FLOMAX) 0.4 MG CAPS capsule, Take 0.4 mg by mouth daily. , Disp: , Rfl:  .  torsemide (DEMADEX) 10 MG tablet, TAKE ONE TABLET BY MOUTH ONCE DAILY, Disp: 30 tablet, Rfl: 3 .  XARELTO 20 MG TABS tablet, TAKE ONE TABLET BY MOUTH ONCE DAILY WITH SUPPER, Disp: 30 tablet, Rfl: 3    Allergies  Allergen  Reactions  . Aspirin Other (See Comments)    Burps, burning, stomach pains, etc  . Penicillins Other (See Comments)    syncope    History   Social History  . Marital Status: Married    Spouse Name: Rosemarie Beath  . Number of Children: 1  . Years of Education: 12   Occupational History  . retired    Social History Main Topics  . Smoking status: Former Smoker    Types: Cigarettes  . Smokeless tobacco: Never Used  . Alcohol Use: No  . Drug Use: No  . Sexual Activity: Not on file   Other Topics Concern  . Not on file   Social History Narrative   Regular exercise-no          Family History  Problem Relation  Age of Onset  . Heart failure Maternal Grandmother   . Diabetes type II Sister 88  . Hyperlipidemia Other     Parent  . Stroke Other     Parent  . Hypertension Other     Parent  . Diabetes Other     Parent  . Colon cancer Neg Hx     ROS- All systems are reviewed and negative except as per the HPI above  Physical Exam: Filed Vitals:   05/12/16 1533  BP: 122/78  Pulse: 81    GEN- The patient is well appearing, alert and oriented x 3 today.   Head- normocephalic, atraumatic Eyes-  Sclera clear, conjunctiva pink Ears- hearing intact Oropharynx- clear Neck- supple, no JVP Lymph- no cervical lymphadenopathy Lungs- Clear to ausculation bilaterally, normal work of breathing Heart- Regular rate and rhythm, no murmurs, rubs or gallops, PMI not laterally displaced GI- soft, NT, ND, + BS Extremities- no clubbing, cyanosis, or edema MS- no significant deformity or atrophy Skin- no rash or lesion Psych- euthymic mood, full affect Neuro- strength and sensation are intact   Epic records are reviewed. EKG-Sinus Rhythm,LBBB Echo reviewed  Assessment and Plan: 1. Nonischemic cardiomyopathy/LBBB The patient has an ischemic CM (EF 25%), NYHA Class II CHF, and LBBB. At this time, he meets SCD-HeFT criteria for ICD implantation for primary prevention of sudden death.  Given LBBB with QRS > 151msec, he also meets criteria for CRT.  Risks, benefits, alternatives to  BiV ICD implantation were discussed in detail with the patient today. The patient  understands that the risks include but are not limited to bleeding, infection, pneumothorax, perforation, tamponade, vascular damage, renal failure, MI, stroke, death, inappropriate shocks, and lead dislodgement and wishes to proceed.  We will therefore schedule device implantation at the next available time.  2. Atrial fibrillation S/p atrial flutter ablation No recent afib Will follow for afib post device implant and consider stopping  anticoagulation if he does not have afib Hold xarelto 48 hours prior to device implant  [name] MD, [facility] 05/12/2016 4:00 PM

## 2016-05-21 ENCOUNTER — Telehealth (HOSPITAL_COMMUNITY): Payer: Self-pay | Admitting: Infectious Diseases

## 2016-05-21 ENCOUNTER — Telehealth (HOSPITAL_COMMUNITY): Payer: Self-pay | Admitting: Pharmacist

## 2016-05-21 NOTE — Telephone Encounter (Signed)
Call received from patient's wife to report that he is having a device implanted with Dr. Rayann Heman soon. Was advised at a previous appointment that they should reschedule appointment with AHF provider after device is placed. Appointment cancelled and information routed to scheduler.   Janene Madeira, RN VAD Coordinator   Office: [phone] 24/7 VAD Pager: [phone]

## 2016-05-21 NOTE — Telephone Encounter (Signed)
Entresto 49-51 mg PO BID and Xarelto PA approved through 11/23/16.   Ruta Hinds. Velva Harman, PharmD, BCPS, CPP Clinical Pharmacist Pager: [phone] Phone: [phone] 05/21/2016 8:39 AM

## 2016-05-23 ENCOUNTER — Encounter (HOSPITAL_COMMUNITY): Payer: Medicare Other

## 2016-05-29 ENCOUNTER — Other Ambulatory Visit (INDEPENDENT_AMBULATORY_CARE_PROVIDER_SITE_OTHER): Payer: Medicare Other | Admitting: *Deleted

## 2016-05-29 ENCOUNTER — Telehealth: Payer: Self-pay | Admitting: *Deleted

## 2016-05-29 DIAGNOSIS — I5022 Chronic systolic (congestive) heart failure: Secondary | ICD-10-CM | POA: Diagnosis not present

## 2016-05-29 DIAGNOSIS — I429 Cardiomyopathy, unspecified: Secondary | ICD-10-CM | POA: Diagnosis not present

## 2016-05-29 DIAGNOSIS — I428 Other cardiomyopathies: Secondary | ICD-10-CM

## 2016-05-29 LAB — CBC WITH DIFFERENTIAL/PLATELET
BASOS PCT: 0 %
Basophils Absolute: 0 cells/uL (ref 0–200)
EOS ABS: 150 {cells}/uL (ref 15–500)
Eosinophils Relative: 2 %
HEMATOCRIT: 37.8 % — AB (ref 38.5–50.0)
Hemoglobin: 12.8 g/dL — ABNORMAL LOW (ref 13.2–17.1)
LYMPHS ABS: 1275 {cells}/uL (ref 850–3900)
Lymphocytes Relative: 17 %
MCH: 32.4 pg (ref 27.0–33.0)
MCHC: 33.9 g/dL (ref 32.0–36.0)
MCV: 95.7 fL (ref 80.0–100.0)
MONO ABS: 525 {cells}/uL (ref 200–950)
MPV: 12 fL (ref 7.5–12.5)
Monocytes Relative: 7 %
NEUTROS ABS: 5550 {cells}/uL (ref 1500–7800)
Neutrophils Relative %: 74 %
PLATELETS: 141 10*3/uL (ref 140–400)
RBC: 3.95 MIL/uL — ABNORMAL LOW (ref 4.20–5.80)
RDW: 14.8 % (ref 11.0–15.0)
WBC: 7.5 10*3/uL (ref 3.8–10.8)

## 2016-05-29 LAB — BASIC METABOLIC PANEL
BUN: 15 mg/dL (ref 7–25)
CHLORIDE: 102 mmol/L (ref 98–110)
CO2: 28 mmol/L (ref 20–31)
Calcium: 9 mg/dL (ref 8.6–10.3)
Creat: 1.26 mg/dL — ABNORMAL HIGH (ref 0.70–1.18)
Glucose, Bld: 246 mg/dL — ABNORMAL HIGH (ref 65–99)
POTASSIUM: 4.6 mmol/L (ref 3.5–5.3)
SODIUM: 141 mmol/L (ref 135–146)

## 2016-05-29 NOTE — Telephone Encounter (Signed)
Pt here for labs and wife had several questions.  She states she wasn't clear on when to stop the Xarelto so he stopped it on Tues.  Advised that needs to restart and stop 48 hrs prior to procedure that is scheduled for 7/13.  Also she states that he has had a cough which Dr. Rayann Heman is aware of but that the cough has gotten worse over past couple of days and is coughing up a lot of white phlegm.  No fever or congestion.  Advised to call his PCP and see if can be seen today or tomorrow.  She states that he had pneumonia about a month or so ago and feels like this is residual.  Advised to see PCP and if cough not better by first of week to call Janan Halter, RN-Dr. Jackalyn Lombard nurse.  She verbalizes understanding and will call back if not any better.

## 2016-05-30 DIAGNOSIS — B9689 Other specified bacterial agents as the cause of diseases classified elsewhere: Secondary | ICD-10-CM | POA: Diagnosis not present

## 2016-05-30 DIAGNOSIS — E1122 Type 2 diabetes mellitus with diabetic chronic kidney disease: Secondary | ICD-10-CM | POA: Diagnosis not present

## 2016-05-30 DIAGNOSIS — J449 Chronic obstructive pulmonary disease, unspecified: Secondary | ICD-10-CM | POA: Diagnosis not present

## 2016-05-30 DIAGNOSIS — J208 Acute bronchitis due to other specified organisms: Secondary | ICD-10-CM | POA: Diagnosis not present

## 2016-06-02 DIAGNOSIS — L299 Pruritus, unspecified: Secondary | ICD-10-CM | POA: Diagnosis not present

## 2016-06-02 DIAGNOSIS — L300 Nummular dermatitis: Secondary | ICD-10-CM | POA: Diagnosis not present

## 2016-06-03 ENCOUNTER — Telehealth: Payer: Self-pay | Admitting: Internal Medicine

## 2016-06-03 NOTE — Telephone Encounter (Signed)
Spoke with patient's wife and let her know as cough is better and no fever okay to proceed

## 2016-06-03 NOTE — Telephone Encounter (Signed)
Follow Up:   Pt received call yesterday to call you back today.

## 2016-06-05 ENCOUNTER — Ambulatory Visit (HOSPITAL_COMMUNITY): Payer: Medicare Other

## 2016-06-05 ENCOUNTER — Encounter (HOSPITAL_COMMUNITY)
Admission: RE | Disposition: A | Discharge status: Home / Self Care | Payer: Self-pay | Source: Ambulatory Visit | Attending: Internal Medicine

## 2016-06-05 ENCOUNTER — Ambulatory Visit (HOSPITAL_COMMUNITY)
Admission: RE | Admit: 2016-06-05 | Discharge: 2016-06-05 | Disposition: A | Discharge status: Home / Self Care | Payer: Medicare Other | Source: Ambulatory Visit | Attending: Internal Medicine | Admitting: Internal Medicine

## 2016-06-05 ENCOUNTER — Encounter (HOSPITAL_COMMUNITY): Payer: Self-pay | Admitting: Internal Medicine

## 2016-06-05 DIAGNOSIS — Z959 Presence of cardiac and vascular implant and graft, unspecified: Secondary | ICD-10-CM | POA: Diagnosis not present

## 2016-06-05 DIAGNOSIS — Z880 Allergy status to penicillin: Secondary | ICD-10-CM | POA: Diagnosis not present

## 2016-06-05 DIAGNOSIS — I428 Other cardiomyopathies: Secondary | ICD-10-CM | POA: Diagnosis present

## 2016-06-05 DIAGNOSIS — E119 Type 2 diabetes mellitus without complications: Secondary | ICD-10-CM | POA: Diagnosis not present

## 2016-06-05 DIAGNOSIS — I429 Cardiomyopathy, unspecified: Secondary | ICD-10-CM | POA: Diagnosis not present

## 2016-06-05 DIAGNOSIS — E669 Obesity, unspecified: Secondary | ICD-10-CM | POA: Insufficient documentation

## 2016-06-05 DIAGNOSIS — I495 Sick sinus syndrome: Secondary | ICD-10-CM | POA: Diagnosis not present

## 2016-06-05 DIAGNOSIS — I5022 Chronic systolic (congestive) heart failure: Secondary | ICD-10-CM | POA: Insufficient documentation

## 2016-06-05 DIAGNOSIS — Z87891 Personal history of nicotine dependence: Secondary | ICD-10-CM | POA: Diagnosis not present

## 2016-06-05 DIAGNOSIS — Z6833 Body mass index (BMI) 33.0-33.9, adult: Secondary | ICD-10-CM | POA: Diagnosis not present

## 2016-06-05 DIAGNOSIS — Z8249 Family history of ischemic heart disease and other diseases of the circulatory system: Secondary | ICD-10-CM | POA: Diagnosis not present

## 2016-06-05 DIAGNOSIS — E7800 Pure hypercholesterolemia, unspecified: Secondary | ICD-10-CM | POA: Insufficient documentation

## 2016-06-05 DIAGNOSIS — I447 Left bundle-branch block, unspecified: Secondary | ICD-10-CM | POA: Insufficient documentation

## 2016-06-05 DIAGNOSIS — Z7901 Long term (current) use of anticoagulants: Secondary | ICD-10-CM | POA: Insufficient documentation

## 2016-06-05 DIAGNOSIS — Z794 Long term (current) use of insulin: Secondary | ICD-10-CM | POA: Insufficient documentation

## 2016-06-05 DIAGNOSIS — I4892 Unspecified atrial flutter: Secondary | ICD-10-CM | POA: Diagnosis not present

## 2016-06-05 DIAGNOSIS — Z006 Encounter for examination for normal comparison and control in clinical research program: Secondary | ICD-10-CM | POA: Insufficient documentation

## 2016-06-05 DIAGNOSIS — I4891 Unspecified atrial fibrillation: Secondary | ICD-10-CM | POA: Diagnosis not present

## 2016-06-05 DIAGNOSIS — Z87442 Personal history of urinary calculi: Secondary | ICD-10-CM | POA: Diagnosis not present

## 2016-06-05 DIAGNOSIS — I110 Hypertensive heart disease with heart failure: Secondary | ICD-10-CM | POA: Insufficient documentation

## 2016-06-05 DIAGNOSIS — I509 Heart failure, unspecified: Secondary | ICD-10-CM | POA: Diagnosis not present

## 2016-06-05 DIAGNOSIS — I519 Heart disease, unspecified: Secondary | ICD-10-CM | POA: Diagnosis present

## 2016-06-05 HISTORY — PX: EP IMPLANTABLE DEVICE: SHX172B

## 2016-06-05 LAB — SURGICAL PCR SCREEN
MRSA, PCR: NEGATIVE
STAPHYLOCOCCUS AUREUS: NEGATIVE

## 2016-06-05 LAB — GLUCOSE, CAPILLARY
GLUCOSE-CAPILLARY: 162 mg/dL — AB (ref 65–99)
GLUCOSE-CAPILLARY: 206 mg/dL — AB (ref 65–99)
Glucose-Capillary: 142 mg/dL — ABNORMAL HIGH (ref 65–99)

## 2016-06-05 SURGERY — BIV ICD INSERTION CRT-D

## 2016-06-05 MED ORDER — MUPIROCIN 2 % EX OINT
TOPICAL_OINTMENT | CUTANEOUS | Status: AC
  Administered 2016-06-05: 1 via TOPICAL
  Filled 2016-06-05: qty 22

## 2016-06-05 MED ORDER — ACETAMINOPHEN 500 MG PO TABS: 500.0000 mg | ORAL_TABLET | Freq: Every evening | ORAL | Status: DC | PRN

## 2016-06-05 MED ORDER — DIPHENHYDRAMINE HCL 25 MG PO CAPS
25.0000 mg | ORAL_CAPSULE | Freq: Every evening | ORAL | Status: DC | PRN
Start: 2016-06-05 — End: 2016-06-05

## 2016-06-05 MED ORDER — IPRATROPIUM-ALBUTEROL 0.5-2.5 (3) MG/3ML IN SOLN: 3.0000 mL | Freq: Three times a day (TID) | RESPIRATORY_TRACT | Status: DC

## 2016-06-05 MED ORDER — CARVEDILOL 25 MG PO TABS: 25.0000 mg | ORAL_TABLET | Freq: Two times a day (BID) | ORAL | Status: DC

## 2016-06-05 MED ORDER — TORSEMIDE 20 MG PO TABS: 10.0000 mg | ORAL_TABLET | Freq: Every day | ORAL | Status: DC

## 2016-06-05 MED ORDER — SODIUM CHLORIDE 0.9 % IR SOLN
Status: DC | PRN
  Administered 2016-06-05: 11:00:00

## 2016-06-05 MED ORDER — SODIUM CHLORIDE 0.9% FLUSH: 3.0000 mL | INTRAVENOUS | Status: DC | PRN

## 2016-06-05 MED ORDER — CHLORHEXIDINE GLUCONATE 4 % EX LIQD: 60.0000 mL | Freq: Once | CUTANEOUS | Status: DC

## 2016-06-05 MED ORDER — TAMSULOSIN HCL 0.4 MG PO CAPS
0.4000 mg | ORAL_CAPSULE | Freq: Every day | ORAL | Status: DC
  Administered 2016-06-05: 0.4 mg via ORAL
  Filled 2016-06-05: qty 1

## 2016-06-05 MED ORDER — MIDAZOLAM HCL 5 MG/5ML IJ SOLN
INTRAMUSCULAR | Status: AC
  Filled 2016-06-05: qty 5

## 2016-06-05 MED ORDER — HYDROCODONE-ACETAMINOPHEN 5-325 MG PO TABS: 1.0000 | ORAL_TABLET | ORAL | Status: DC | PRN

## 2016-06-05 MED ORDER — ACETAMINOPHEN 325 MG PO TABS: 325.0000 mg | ORAL_TABLET | ORAL | Status: DC | PRN

## 2016-06-05 MED ORDER — SODIUM CHLORIDE 0.9 % IR SOLN
80.0000 mg | Status: DC
  Filled 2016-06-05: qty 2

## 2016-06-05 MED ORDER — ONDANSETRON HCL 4 MG/2ML IJ SOLN: 4.0000 mg | Freq: Four times a day (QID) | INTRAMUSCULAR | Status: DC | PRN

## 2016-06-05 MED ORDER — HEPARIN (PORCINE) IN NACL 2-0.9 UNIT/ML-% IJ SOLN
INTRAMUSCULAR | Status: DC | PRN
  Administered 2016-06-05: 500 mL

## 2016-06-05 MED ORDER — SODIUM CHLORIDE 0.9% FLUSH
3.0000 mL | Freq: Two times a day (BID) | INTRAVENOUS | Status: DC
  Administered 2016-06-05: 3 mL via INTRAVENOUS

## 2016-06-05 MED ORDER — MIDAZOLAM HCL 5 MG/5ML IJ SOLN
INTRAMUSCULAR | Status: DC | PRN
  Administered 2016-06-05 (×4): 1 mg via INTRAVENOUS

## 2016-06-05 MED ORDER — RIVAROXABAN 20 MG PO TABS: 20.0000 mg | ORAL_TABLET | Freq: Every day | ORAL | Status: DC

## 2016-06-05 MED ORDER — LIDOCAINE HCL (PF) 1 % IJ SOLN
INTRAMUSCULAR | Status: AC
  Filled 2016-06-05: qty 30

## 2016-06-05 MED ORDER — SODIUM CHLORIDE 0.9 % IV SOLN
INTRAVENOUS | Status: DC
  Administered 2016-06-05: 10:00:00 via INTRAVENOUS

## 2016-06-05 MED ORDER — DIPHENHYDRAMINE-APAP (SLEEP) 25-500 MG PO TABS: 2.0000 | ORAL_TABLET | Freq: Every evening | ORAL | Status: DC | PRN

## 2016-06-05 MED ORDER — VANCOMYCIN HCL 10 G IV SOLR
1500.0000 mg | INTRAVENOUS | Status: AC
  Administered 2016-06-05: 1500 mg via INTRAVENOUS
  Filled 2016-06-05: qty 1500

## 2016-06-05 MED ORDER — BENZONATATE 100 MG PO CAPS
200.0000 mg | ORAL_CAPSULE | Freq: Three times a day (TID) | ORAL | Status: DC
  Administered 2016-06-05: 200 mg via ORAL
  Filled 2016-06-05: qty 2

## 2016-06-05 MED ORDER — IOPAMIDOL (ISOVUE-370) INJECTION 76%
INTRAVENOUS | Status: DC | PRN
  Administered 2016-06-05: 30 mL
  Administered 2016-06-05: 15 mL via INTRAVENOUS

## 2016-06-05 MED ORDER — LIDOCAINE HCL (PF) 1 % IJ SOLN
INTRAMUSCULAR | Status: AC
  Filled 2016-06-05: qty 60

## 2016-06-05 MED ORDER — LORATADINE 10 MG PO TABS: 10.0000 mg | ORAL_TABLET | Freq: Every day | ORAL | Status: DC

## 2016-06-05 MED ORDER — LIDOCAINE HCL (PF) 1 % IJ SOLN
INTRAMUSCULAR | Status: DC | PRN
  Administered 2016-06-05: 70 mL

## 2016-06-05 MED ORDER — INSULIN NPH (HUMAN) (ISOPHANE) 100 UNIT/ML ~~LOC~~ SUSP
85.0000 [IU] | Freq: Every day | SUBCUTANEOUS | Status: DC
  Administered 2016-06-05: 85 [IU] via SUBCUTANEOUS
  Filled 2016-06-05: qty 10

## 2016-06-05 MED ORDER — IOPAMIDOL (ISOVUE-370) INJECTION 76%
INTRAVENOUS | Status: AC
  Filled 2016-06-05: qty 100

## 2016-06-05 MED ORDER — VANCOMYCIN HCL 10 G IV SOLR
1500.0000 mg | INTRAVENOUS | Status: DC
  Filled 2016-06-05: qty 1500

## 2016-06-05 MED ORDER — SODIUM CHLORIDE 0.9 % IR SOLN
Status: AC
  Filled 2016-06-05: qty 2

## 2016-06-05 MED ORDER — VANCOMYCIN HCL IN DEXTROSE 1-5 GM/200ML-% IV SOLN
1000.0000 mg | Freq: Two times a day (BID) | INTRAVENOUS | Status: DC
  Filled 2016-06-05: qty 200

## 2016-06-05 MED ORDER — HEPARIN (PORCINE) IN NACL 2-0.9 UNIT/ML-% IJ SOLN: INTRAMUSCULAR | Status: DC | PRN

## 2016-06-05 MED ORDER — FENTANYL CITRATE (PF) 100 MCG/2ML IJ SOLN
INTRAMUSCULAR | Status: AC
  Filled 2016-06-05: qty 2

## 2016-06-05 MED ORDER — DIGOXIN 125 MCG PO TABS: 0.1250 mg | ORAL_TABLET | Freq: Every day | ORAL | Status: DC

## 2016-06-05 MED ORDER — FLUTICASONE FUROATE-VILANTEROL 100-25 MCG/INH IN AEPB
1.0000 | INHALATION_SPRAY | Freq: Every day | RESPIRATORY_TRACT | Status: DC
  Filled 2016-06-05: qty 28

## 2016-06-05 MED ORDER — HEPARIN (PORCINE) IN NACL 2-0.9 UNIT/ML-% IJ SOLN
INTRAMUSCULAR | Status: AC
  Filled 2016-06-05: qty 500

## 2016-06-05 MED ORDER — FENTANYL CITRATE (PF) 100 MCG/2ML IJ SOLN
INTRAMUSCULAR | Status: DC | PRN
  Administered 2016-06-05 (×4): 12.5 ug via INTRAVENOUS

## 2016-06-05 MED ORDER — SODIUM CHLORIDE 0.9 % IV SOLN: 250.0000 mL | INTRAVENOUS | Status: DC | PRN

## 2016-06-05 MED ORDER — MUPIROCIN 2 % EX OINT
1.0000 "application " | TOPICAL_OINTMENT | Freq: Once | CUTANEOUS | Status: AC
  Administered 2016-06-05: 1 via TOPICAL
  Filled 2016-06-05: qty 22

## 2016-06-05 SURGICAL SUPPLY — 17 items
ADAPTER SEALING SSA-EW-09 (MISCELLANEOUS) ×3 IMPLANT
CABLE SURGICAL S-101-97-12 (CABLE) ×3 IMPLANT
CATH ATTAIN COM SURV 6250V-MB2 (CATHETERS) ×3 IMPLANT
CATH HEXAPOLAR DAMATO 6F (CATHETERS) ×3 IMPLANT
ICD VIVA QUAD XT CRT-D DTBA1QQ (ICD Generator) ×3 IMPLANT
KIT ESSENTIALS PG (KITS) ×3 IMPLANT
LEAD ATTAIN PERFORMA S 4598-88 (Lead) ×3 IMPLANT
LEAD CAPSURE NOVUS 5076-52CM (Lead) ×3 IMPLANT
LEAD SPRINT QUAT SEC 6935M-62 (Lead) ×3 IMPLANT
PAD DEFIB LIFELINK (PAD) ×3 IMPLANT
SHEATH CLASSIC 7F (SHEATH) ×3 IMPLANT
SHEATH CLASSIC 9.5F (SHEATH) ×3 IMPLANT
SHEATH CLASSIC 9F (SHEATH) ×3 IMPLANT
SHEATH PINNACLE 6F 10CM (SHEATH) ×3 IMPLANT
SLITTER 6232ADJ (MISCELLANEOUS) ×3 IMPLANT
TRAY PACEMAKER INSERTION (PACKS) ×3 IMPLANT
WIRE ACUITY WHISPER EDS 4648 (WIRE) ×3 IMPLANT

## 2016-06-05 NOTE — Discharge Instructions (Signed)
° ° °  Supplemental Discharge Instructions for  Pacemaker/Defibrillator Patients  Activity No heavy lifting or vigorous activity with your left/right arm for 6 to 8 weeks.  Do not raise your left/right arm above your head for one week.            NO DRIVING for 1 week  WOUND CARE - Keep the wound area clean and dry.  Do not get this area wet for one week. No showers for 10 days - The tape/steri-strips on your wound will fall off; do not pull them off.  No bandage is needed on the site.  DO  NOT apply any creams, oils, or ointments to the wound area. - If you notice any drainage or discharge from the wound, any swelling or bruising at the site, or you develop a fever > 101? F after you are discharged home, call the office at once.  Special Instructions - You are still able to use cellular telephones; use the ear opposite the side where you have your pacemaker/defibrillator.  Avoid carrying your cellular phone near your device. - When traveling through airports, show security personnel your identification card to avoid being screened in the metal detectors.  Ask the security personnel to use the hand wand. - Avoid arc welding equipment, MRI testing (magnetic resonance imaging), TENS units (transcutaneous nerve stimulators).  Call the office for questions about other devices. - Avoid electrical appliances that are in poor condition or are not properly grounded. - Microwave ovens are safe to be near or to operate.  Additional information for defibrillator patients should your device go off: - If your device goes off ONCE and you feel fine afterward, notify the device clinic nurses. - If your device goes off ONCE and you do not feel well afterward, call 911. - If your device goes off TWICE, call 911. - If your device goes off THREE times in one day, call 911.  DO NOT DRIVE YOURSELF OR A FAMILY MEMBER WITH A DEFIBRILLATOR TO THE HOSPITAL--CALL 911.

## 2016-06-05 NOTE — Interval H&P Note (Signed)
History and Physical Interval Note:  06/05/2016 9:44 AM  Justin Hill  has presented today for surgery, with the diagnosis of chf - lv disfunction  The various methods of treatment have been discussed with the patient and family. After consideration of risks, benefits and other options for treatment, the patient has consented to  Procedure(s): BiV ICD Insertion CRT-D (N/A) as a surgical intervention .  The patient's history has been reviewed, patient examined, no change in status, stable for surgery.  I have reviewed the patient's chart and labs.  Questions were answered to the patient's satisfaction.     ICD Criteria  Current LVEF:30%. Within 12 months prior to implant: Yes   (cath)  Heart failure history: Yes, Class II  Cardiomyopathy history: Yes, Non-Ischemic Cardiomyopathy.  Atrial Fibrillation/Atrial Flutter: Yes, Paroxysmal.  Ventricular tachycardia history: No.  Cardiac arrest history: No.  History of syndromes with risk of sudden death: No.  Previous ICD: No.  Current ICD indication: Primary  PPM indication: Yes. Pacing type: Ventricular. Greater than 40% RV pacing requirement anticipated. Indication: Sick Sinus Syndrome  (BIV pacing for LBBB)   Class I or II Bradycardia indication present: Yes  (BIV pacing for LBBB)  Beta Blocker therapy for 3 or more months: Yes, prescribed.   Ace Inhibitor/ARB therapy for 3 or more months: Yes, prescribed.     [name]

## 2016-06-05 NOTE — H&P (View-Only) (Signed)
Primary Care Physician: Cyndy Freeze, MD Cardiologist: Dr. Soundra Pilon Duaine Locurto is a 75 y.o. male  nonischemic CM, chronic systolic dysfunction,  EF 25-30%, LBBB, diabetes, HTN, and obesity. Justin Hill He is followed closely by Dr Aundra Dubin.  The patient has had worsening CHF symptoms.  He has fatigue and decreased exercise tolerance.  He has been treated with an optimal medical regimen without improvement.  Today, he denies symptoms of palpitations, chest pain, shortness of breath, orthopnea, PND, lower extremity edema, dizziness, presyncope, syncope, or neurologic sequela. The patient is tolerating medications without difficulties and is otherwise without complaint today.   Past Medical History  Diagnosis Date  . Cardiomyopathy   . Personal history of kidney stones   . Cardiomyopathy   . CHF (congestive heart failure)   . Diabetes mellitus   . Hypercholesterolemia   . Hypertension   . Diverticulosis   . A-fib    Past Surgical History  Procedure Laterality Date  . Shoulder surgery  1981    left. Muscle reattachment  . Tonsillectomy    . Toe surgery Right 12/2012    2nd toe  . Colonoscopy N/A 12/29/2013    Procedure: COLONOSCOPY;  Surgeon: Lafayette Dragon, MD;  Location: [facility];  Service: Endoscopy;  Laterality: N/A;  . Atrial flutter ablation N/A 01/19/2015    Procedure: ATRIAL FLUTTER ABLATION;  Surgeon: [name], MD;  Location: [facility] CATH LAB;  Service: Cardiovascular;  Laterality: N/A;    Current outpatient prescriptions:  .  Ascorbic Acid (VITAMIN C) 500 MG CAPS, Take 1 capsule by mouth 2 (two) times daily. , Disp: , Rfl:  .  Bilberry, Vaccinium myrtillus, (BILBERRY EXTRACT PO), Take 1,000 mg by mouth daily. , Disp: , Rfl:  .  carvedilol (COREG) 25 MG tablet, TAKE ONE TABLET BY MOUTH TWICE DAILY WITH MEALS, Disp: 180 tablet, Rfl: 0 .  CINNAMON PO, Take 1 tablet by mouth every evening. Reported on 11/13/2015, Disp: , Rfl:  .  digoxin (LANOXIN) 0.125 MG tablet, Take by  mouth daily., Disp: , Rfl:  .  diphenhydramine-acetaminophen (TYLENOL PM) 25-500 MG TABS, Take 2 tablets by mouth at bedtime as needed (sleep). , Disp: , Rfl:  .  folic acid (FOLVITE) 1 MG tablet, Take 1 mg by mouth daily., Disp: , Rfl:  .  insulin NPH Human (NOVOLIN N) 100 UNIT/ML injection, Inject 0.85 mLs (85 Units total) into the skin daily before breakfast., Disp: 30 mL, Rfl: 2 .  insulin NPH-regular Human (NOVOLIN 70/30) (70-30) 100 UNIT/ML injection, Inject 30 Units into the skin daily with supper., Disp: 20 mL, Rfl: 11 .  methotrexate 2.5 MG tablet, Take 10 mg by mouth See admin instructions. Take 10 mg every morning & 10 mg every evening ONLY on Wednesday, Disp: , Rfl:  .  Multiple Vitamin (MULTIVITAMIN) tablet, Take 1 tablet by mouth daily.  , Disp: , Rfl:  .  sacubitril-valsartan (ENTRESTO) 49-51 MG, Take 1 tablet by mouth 2 (two) times daily., Disp: 60 tablet, Rfl: 3 .  simvastatin (ZOCOR) 40 MG tablet, Take 40 mg by mouth at bedtime.  , Disp: , Rfl:  .  tamsulosin (FLOMAX) 0.4 MG CAPS capsule, Take 0.4 mg by mouth daily. , Disp: , Rfl:  .  torsemide (DEMADEX) 10 MG tablet, TAKE ONE TABLET BY MOUTH ONCE DAILY, Disp: 30 tablet, Rfl: 3 .  XARELTO 20 MG TABS tablet, TAKE ONE TABLET BY MOUTH ONCE DAILY WITH SUPPER, Disp: 30 tablet, Rfl: 3    Allergies  Allergen  Reactions  . Aspirin Other (See Comments)    Burps, burning, stomach pains, etc  . Penicillins Other (See Comments)    syncope    History   Social History  . Marital Status: Married    Spouse Name: Rosemarie Beath  . Number of Children: 1  . Years of Education: 12   Occupational History  . retired    Social History Main Topics  . Smoking status: Former Smoker    Types: Cigarettes  . Smokeless tobacco: Never Used  . Alcohol Use: No  . Drug Use: No  . Sexual Activity: Not on file   Other Topics Concern  . Not on file   Social History Narrative   Regular exercise-no          Family History  Problem Relation  Age of Onset  . Heart failure Maternal Grandmother   . Diabetes type II Sister 60  . Hyperlipidemia Other     Parent  . Stroke Other     Parent  . Hypertension Other     Parent  . Diabetes Other     Parent  . Colon cancer Neg Hx     ROS- All systems are reviewed and negative except as per the HPI above  Physical Exam: Filed Vitals:   05/12/16 1533  BP: 122/78  Pulse: 81    GEN- The patient is well appearing, alert and oriented x 3 today.   Head- normocephalic, atraumatic Eyes-  Sclera clear, conjunctiva pink Ears- hearing intact Oropharynx- clear Neck- supple, no JVP Lymph- no cervical lymphadenopathy Lungs- Clear to ausculation bilaterally, normal work of breathing Heart- Regular rate and rhythm, no murmurs, rubs or gallops, PMI not laterally displaced GI- soft, NT, ND, + BS Extremities- no clubbing, cyanosis, or edema MS- no significant deformity or atrophy Skin- no rash or lesion Psych- euthymic mood, full affect Neuro- strength and sensation are intact   Epic records are reviewed. EKG-Sinus Rhythm,LBBB Echo reviewed  Assessment and Plan: 1. Nonischemic cardiomyopathy/LBBB The patient has an ischemic CM (EF 25%), NYHA Class II CHF, and LBBB. At this time, he meets SCD-HeFT criteria for ICD implantation for primary prevention of sudden death.  Given LBBB with QRS > 172msec, he also meets criteria for CRT.  Risks, benefits, alternatives to  BiV ICD implantation were discussed in detail with the patient today. The patient  understands that the risks include but are not limited to bleeding, infection, pneumothorax, perforation, tamponade, vascular damage, renal failure, MI, stroke, death, inappropriate shocks, and lead dislodgement and wishes to proceed.  We will therefore schedule device implantation at the next available time.  2. Atrial fibrillation S/p atrial flutter ablation No recent afib Will follow for afib post device implant and consider stopping  anticoagulation if he does not have afib Hold xarelto 48 hours prior to device implant  [name] MD, [facility] 05/12/2016 4:00 PM

## 2016-06-05 NOTE — Progress Notes (Signed)
Dr. Rayann Heman notified of draining area on heal.  Drainage is clear serous drainage with area appearing to be a blister.  Dr. Rayann Heman in to see pt.  and stated ok to proceed with pt.

## 2016-06-05 NOTE — Discharge Summary (Signed)
Pt got discharged to home, discharge instructions provided and patient showed understanding to it, IV taken out,Telemonitor DC,pt left unit in wheelchair with all of the belongings accompanied with a family member (wife) 

## 2016-06-06 ENCOUNTER — Telehealth: Payer: Self-pay | Admitting: Internal Medicine

## 2016-06-06 ENCOUNTER — Telehealth: Payer: Self-pay | Admitting: Nurse Practitioner

## 2016-06-06 DIAGNOSIS — L97529 Non-pressure chronic ulcer of other part of left foot with unspecified severity: Secondary | ICD-10-CM | POA: Diagnosis not present

## 2016-06-06 DIAGNOSIS — E1122 Type 2 diabetes mellitus with diabetic chronic kidney disease: Secondary | ICD-10-CM | POA: Diagnosis not present

## 2016-06-06 DIAGNOSIS — E08621 Diabetes mellitus due to underlying condition with foot ulcer: Secondary | ICD-10-CM | POA: Diagnosis not present

## 2016-06-06 DIAGNOSIS — N183 Chronic kidney disease, stage 3 (moderate): Secondary | ICD-10-CM | POA: Diagnosis not present

## 2016-06-06 NOTE — Telephone Encounter (Signed)
New message   Pt is now on Entresto 49.51mg  and she states that the directions states to discontinue use of other medications   Pt c/o medication issue:  1. Name of Medication: Entresto   2. How are you currently taking this medication (dosage and times per day)? 49.51mg     2x day  3. Are you having a reaction (difficulty breathing--STAT)? no 4. What is your medication issue? Need to go over medication

## 2016-06-06 NOTE — Telephone Encounter (Signed)
Device transmission reviewed this morning and normal.  Left message for patient to call back to make sure he didn't have any questions about device post-op care after discharge yesterday evening. Will forward to device clinic to follow up later today as I am out of the office.   Chanetta Marshall, NP 06/06/2016 8:26 AM

## 2016-06-06 NOTE — Telephone Encounter (Signed)
F/u  Pt wife returning phone call- has additional questions about surgery. Please call back and discuss.

## 2016-06-06 NOTE — Telephone Encounter (Signed)
Returned pt's wife phone call. Pts wife had a question about a note on DC instructions regarding Entresto. Pts wife informed that the note was to pharmacy and that pt was to keep taking Entresto as directed. Pts wife voiced understanding.

## 2016-06-09 NOTE — Telephone Encounter (Signed)
Addressed 7/14 by Chanetta Marshall, NP in another phone message

## 2016-06-16 ENCOUNTER — Encounter: Payer: Self-pay | Admitting: Cardiology

## 2016-06-16 ENCOUNTER — Ambulatory Visit (INDEPENDENT_AMBULATORY_CARE_PROVIDER_SITE_OTHER): Payer: Medicare Other | Admitting: *Deleted

## 2016-06-16 DIAGNOSIS — I5022 Chronic systolic (congestive) heart failure: Secondary | ICD-10-CM | POA: Diagnosis not present

## 2016-06-16 DIAGNOSIS — I429 Cardiomyopathy, unspecified: Secondary | ICD-10-CM

## 2016-06-16 DIAGNOSIS — I447 Left bundle-branch block, unspecified: Secondary | ICD-10-CM | POA: Diagnosis not present

## 2016-06-16 DIAGNOSIS — Z95810 Presence of automatic (implantable) cardiac defibrillator: Secondary | ICD-10-CM

## 2016-06-16 DIAGNOSIS — I428 Other cardiomyopathies: Secondary | ICD-10-CM

## 2016-06-16 LAB — CUP PACEART INCLINIC DEVICE CHECK
Battery Remaining Longevity: 100 mo
Brady Statistic AP VS Percent: 0.18 %
Brady Statistic AS VS Percent: 1.51 %
Brady Statistic RA Percent Paced: 7.32 %
Brady Statistic RV Percent Paced: 1 %
HIGH POWER IMPEDANCE MEASURED VALUE: 63 Ohm
Implantable Lead Implant Date: 20170713
Implantable Lead Implant Date: 20170713
Implantable Lead Location: 753858
Implantable Lead Location: 753859
Implantable Lead Location: 753860
Implantable Lead Model: 4598
Implantable Lead Model: 5076
Lead Channel Impedance Value: 361 Ohm
Lead Channel Impedance Value: 399 Ohm
Lead Channel Impedance Value: 418 Ohm
Lead Channel Impedance Value: 532 Ohm
Lead Channel Impedance Value: 646 Ohm
Lead Channel Impedance Value: 665 Ohm
Lead Channel Pacing Threshold Amplitude: 0.5 V
Lead Channel Pacing Threshold Pulse Width: 0.4 ms
Lead Channel Sensing Intrinsic Amplitude: 11 mV
Lead Channel Setting Pacing Amplitude: 3.5 V
Lead Channel Setting Sensing Sensitivity: 0.3 mV
MDC IDC LEAD IMPLANT DT: 20170713
MDC IDC MSMT BATTERY VOLTAGE: 3.11 V
MDC IDC MSMT LEADCHNL LV IMPEDANCE VALUE: 399 Ohm
MDC IDC MSMT LEADCHNL LV IMPEDANCE VALUE: 399 Ohm
MDC IDC MSMT LEADCHNL LV IMPEDANCE VALUE: 418 Ohm
MDC IDC MSMT LEADCHNL LV IMPEDANCE VALUE: 665 Ohm
MDC IDC MSMT LEADCHNL LV IMPEDANCE VALUE: 703 Ohm
MDC IDC MSMT LEADCHNL LV IMPEDANCE VALUE: 703 Ohm
MDC IDC MSMT LEADCHNL LV PACING THRESHOLD AMPLITUDE: 1.625 V
MDC IDC MSMT LEADCHNL LV PACING THRESHOLD PULSEWIDTH: 0.4 ms
MDC IDC MSMT LEADCHNL RA IMPEDANCE VALUE: 456 Ohm
MDC IDC MSMT LEADCHNL RA SENSING INTR AMPL: 2.875 mV
MDC IDC MSMT LEADCHNL RV PACING THRESHOLD AMPLITUDE: 0.75 V
MDC IDC MSMT LEADCHNL RV PACING THRESHOLD PULSEWIDTH: 0.4 ms
MDC IDC SESS DTM: 20170724184417
MDC IDC SET LEADCHNL LV PACING AMPLITUDE: 3.25 V
MDC IDC SET LEADCHNL LV PACING PULSEWIDTH: 0.4 ms
MDC IDC STAT BRADY AP VP PERCENT: 7.14 %
MDC IDC STAT BRADY AS VP PERCENT: 91.17 %

## 2016-06-16 NOTE — Progress Notes (Signed)
Wound check appointment. Steri-strips removed. Wound without redness. Slight edema noted over device site, soft to palpation. Incision edges approximated, wound well healed. Patient and wife aware to call with worsening symptoms or if signs/symptoms of infection develop.  Normal device function. Thresholds, sensing, and impedances consistent with implant measurements. Device programmed at 3.5V (RA, RV) and 3.25V (LV) for extra safety margin until 3 month visit. Histogram distribution appropriate for patient and level of activity. BiVp 98.1%. No mode switches or ventricular arrhythmias noted. Patient educated about wound care, arm mobility, lifting restrictions, shock plan. ROV with AS on 07/23/16 and with JA on 09/08/16.

## 2016-06-29 ENCOUNTER — Other Ambulatory Visit: Payer: Self-pay | Admitting: Cardiology

## 2016-06-30 DIAGNOSIS — N183 Chronic kidney disease, stage 3 (moderate): Secondary | ICD-10-CM | POA: Diagnosis not present

## 2016-06-30 DIAGNOSIS — E08610 Diabetes mellitus due to underlying condition with diabetic neuropathic arthropathy: Secondary | ICD-10-CM | POA: Diagnosis not present

## 2016-06-30 DIAGNOSIS — Z794 Long term (current) use of insulin: Secondary | ICD-10-CM | POA: Diagnosis not present

## 2016-06-30 DIAGNOSIS — E1122 Type 2 diabetes mellitus with diabetic chronic kidney disease: Secondary | ICD-10-CM | POA: Diagnosis not present

## 2016-07-06 ENCOUNTER — Other Ambulatory Visit: Payer: Self-pay | Admitting: Cardiology

## 2016-07-07 ENCOUNTER — Ambulatory Visit (INDEPENDENT_AMBULATORY_CARE_PROVIDER_SITE_OTHER): Payer: Medicare Other | Admitting: Endocrinology

## 2016-07-07 ENCOUNTER — Encounter: Payer: Self-pay | Admitting: Endocrinology

## 2016-07-07 VITALS — BP 126/52 | HR 69 | Temp 98.1°F | Ht 72.0 in | Wt 236.2 lb

## 2016-07-07 DIAGNOSIS — E1310 Other specified diabetes mellitus with ketoacidosis without coma: Secondary | ICD-10-CM | POA: Diagnosis not present

## 2016-07-07 LAB — POCT GLYCOSYLATED HEMOGLOBIN (HGB A1C): HEMOGLOBIN A1C: 6.7

## 2016-07-07 MED ORDER — INSULIN NPH ISOPHANE & REGULAR (70-30) 100 UNIT/ML ~~LOC~~ SUSP: 25.0000 [IU] | Freq: Every day | SUBCUTANEOUS | 11 refills | Status: DC

## 2016-07-07 NOTE — Progress Notes (Signed)
Subjective:    Patient ID: Justin Hill, male    DOB: Dec 30, 1940, 75 y.o.   MRN: [id number]  HPI Pt returns for f/u of diabetes mellitus: DM type: Insulin-requiring type 2. Dx'ed: 123456 Complications: CHF, toe ulcer, partial toe amputation, polyneuropathy, and renal insufficiency.  Therapy: insulin since 2012.  DKA: never.   Severe hypoglycemia: never.    Pancreatitis: never.   Other: he takes human insulin for cost reasons; in October of 2014, he was changed from multiple daily injections to BID, due to persistently high a1c.  The pattern of cbg's indicates he needs NPH in AM and 70/30 with dinner.  Interval history:  i asked wife, who brings a record of her cbg's which I have reviewed today.  It varies from 29-300, but most are in the low to mid-100's. It is lowest in am.  No recent steroids.  Past Medical History:  Diagnosis Date  . A-fib ([place])   . Cardiomyopathy   . Cardiomyopathy   . CHF (congestive heart failure) ([place])   . Diabetes mellitus   . Diverticulosis   . Hypercholesterolemia   . Hypertension   . Personal history of kidney stones     Past Surgical History:  Procedure Laterality Date  . ATRIAL FLUTTER ABLATION N/A 01/19/2015   Procedure: ATRIAL FLUTTER ABLATION;  Surgeon: [name], MD;  Location: [facility] CATH LAB;  Service: Cardiovascular;  Laterality: N/A;  . CARDIAC CATHETERIZATION N/A 05/02/2016   Procedure: Right/Left Heart Cath and Coronary Angiography;  Surgeon: Larey Dresser, MD;  Location: [facility] CV LAB;  Service: Cardiovascular;  Laterality: N/A;  . COLONOSCOPY N/A 12/29/2013   Procedure: COLONOSCOPY;  Surgeon: Lafayette Dragon, MD;  Location: [facility] ENDOS[facility];  Service: Endoscopy;  Laterality: N/A;  . EP IMPLANTABLE DEVICE N/A 06/05/2016   Procedure: BiV ICD Insertion CRT-D;  Surgeon: [name], MD;  Location: [facility] CV LAB;  Service: Cardiovascular;  Laterality: N/A;  . [place]   left. Muscle reattachment  . TOE SURGERY Right  12/2012   2nd toe  . TONSILLECTOMY      Social History   Social History  . Marital status: Married    Spouse name: Rosemarie Beath  . Number of children: 1  . Years of education: 2   Occupational History  . retired    Social History Main Topics  . Smoking status: Former Smoker    Types: Cigarettes  . Smokeless tobacco: Never Used  . Alcohol use No  . Drug use: No  . Sexual activity: Not on file   Other Topics Concern  . Not on file   Social History Narrative   Regular exercise-no          Current Outpatient Prescriptions on File Prior to Visit  Medication Sig Dispense Refill  . Ascorbic Acid (VITAMIN C) 500 MG CAPS Take 1 capsule by mouth 2 (two) times daily.     . benzonatate (TESSALON) 200 MG capsule Take 200 mg by mouth 3 (three) times daily.    . carvedilol (COREG) 25 MG tablet TAKE ONE TABLET BY MOUTH TWICE DAILY WITH MEALS 180 tablet 3  . CINNAMON PO Take 1 tablet by mouth every evening. Reported on 11/13/2015    . digoxin (LANOXIN) 0.125 MG tablet Take 0.125 mg by mouth daily.     . diphenhydramine-acetaminophen (TYLENOL PM) 25-500 MG TABS Take 2 tablets by mouth at bedtime as needed (sleep).     . folic acid (FOLVITE) 1 MG tablet  Take 1 mg by mouth daily.    . insulin NPH Human (NOVOLIN N) 100 UNIT/ML injection Inject 0.85 mLs (85 Units total) into the skin daily before breakfast. 30 mL 2  . methotrexate 2.5 MG tablet Take 7.5 mg by mouth See admin instructions. Take 10 mg every Wednesday morning ONLY    . Multiple Vitamin (MULTIVITAMIN) tablet Take 1 tablet by mouth daily.      . rivaroxaban (XARELTO) 20 MG TABS tablet Take 1 tablet (20 mg total) by mouth daily with supper. Resume 06/07/16 30 tablet 3  . silver sulfADIAZINE (SILVADENE) 1 % cream Apply 1 application topically 2 (two) times daily. Apply to left foot blister twice daily.    . simvastatin (ZOCOR) 40 MG tablet Take 40 mg by mouth at bedtime.      . tamsulosin (FLOMAX) 0.4 MG CAPS capsule Take 0.4 mg by  mouth daily.     . Bilberry, Vaccinium myrtillus, (BILBERRY EXTRACT PO) Take 1,000 mg by mouth daily.     Marland Kitchen ENTRESTO 49-51 MG TAKE ONE TABLET BY MOUTH TWICE DAILY 60 tablet 3  . fluticasone furoate-vilanterol (BREO ELLIPTA) 100-25 MCG/INH AEPB Inhale 1 puff into the lungs daily.    Marland Kitchen ipratropium-albuterol (DUONEB) 0.5-2.5 (3) MG/3ML SOLN Take 3 mLs by nebulization daily.     Marland Kitchen torsemide (DEMADEX) 10 MG tablet TAKE ONE TABLET BY MOUTH ONCE DAILY 30 tablet 3  . XARELTO 20 MG TABS tablet TAKE ONE TABLET BY MOUTH ONCE DAILY WITH  SUPPER 30 tablet 3   No current facility-administered medications on file prior to visit.     Allergies  Allergen Reactions  . Aspirin Other (See Comments)    Burps, burning, stomach pains, etc  . Penicillins Other (See Comments)    syncope  . Lisinopril Cough    Severe coughing    Family History  Problem Relation Age of Onset  . Heart failure Maternal Grandmother   . Diabetes type II Sister 98  . Hyperlipidemia Other     Parent  . Stroke Other     Parent  . Hypertension Other     Parent  . Diabetes Other     Parent  . Colon cancer Neg Hx     BP (!) 126/52   Pulse 69   Temp 98.1 F (36.7 C) (Oral)   Ht 6' (1.829 m)   Wt 236 lb 3.2 oz (107.1 kg)   SpO2 96%   BMI 32.03 kg/m   Review of Systems Denies LOC    Objective:   Physical Exam VITAL SIGNS:  See vs page GENERAL: no distress EXTEMITIES: no deformity, except for hammer toes on the left foot. feet are of normal temp. SKIN: Left heel: 5 cm healing blister.  No open ulcer.   CV: 1+ bilat leg edema. There is bilateral onychomycosis, rust-colored discoloration, and varicosities.  PULSES: dorsalis pedis intact bilat.  NEURO: sensation is intact to touch on the feet, but decreased from normal.    Lab Results  Component Value Date   HGBA1C 6.7 07/07/2016       Assessment & Plan:  Type 2 DM: overcontrolled, given this regimen, which does match insulin to his changing needs throughout  the day. Foot blister, new

## 2016-07-07 NOTE — Patient Instructions (Addendum)
Please continue the same morning insulin: 85 units of NPH (50 if you are going to be active).   In the evening, take just 25 units of "70/30."  You should take this with supper, rather than at bedtime.   On this type of insulin schedule, you should eat meals on a regular schedule.  If a meal is missed or significantly delayed, your blood sugar could go low.   check your blood sugar twice a day.  vary the time of day when you check, between before the 3 meals, and at bedtime.  also check if you have symptoms of your blood sugar being too high or too low.  please keep a record of the readings and bring it to your next appointment here.  please call us sooner if your blood sugar goes below 70, or if you have a lot of readings over 200.   If you have any blood sugar under 80, please write on the paper why you think that was.   Please continue to follow the blister on your foot, and call  If it gets worse again.   Please come back for a follow-up appointment in 3 months.

## 2016-07-14 DIAGNOSIS — L299 Pruritus, unspecified: Secondary | ICD-10-CM | POA: Diagnosis not present

## 2016-07-14 DIAGNOSIS — L300 Nummular dermatitis: Secondary | ICD-10-CM | POA: Diagnosis not present

## 2016-07-21 ENCOUNTER — Encounter: Payer: Self-pay | Admitting: Nurse Practitioner

## 2016-07-21 NOTE — Progress Notes (Signed)
Electrophysiology Office Note Date: 07/23/2016  ID:  Justin Hill, Justin Hill 1941-01-18, MRN [id number]  PCP: Cyndy Freeze, MD Primary Cardiologist: Aundra Dubin Electrophysiologist: Allred  CC: 6 week post CRTD implant follow up  Justin Hill is a 75 y.o. male seen today for Dr Rayann Heman.  He presents today for electrophysiology followup following CRTD implant.  Since discharge, the patient reports doing very well. He thinks that his heart failure symptoms are improved. His wife is concerned about his memory and focus. He denies chest pain, palpitations, dyspnea, PND, orthopnea, nausea, vomiting, dizziness, syncope, edema, weight gain, or early satiety.  He has not had ICD shocks.   Device History: MDT CRTD implanted 2017 for NICM, CHF, LBBB History of appropriate therapy: No History of AAD therapy: No   Past Medical History:  Diagnosis Date  . Cardiomyopathy   . CHF (congestive heart failure) ([place])   . Diabetes mellitus   . Diverticulosis   . Hypercholesterolemia   . Hypertension   . Paroxysmal atrial fibrillation ([facility])   . Personal history of kidney stones    Past Surgical History:  Procedure Laterality Date  . ATRIAL FLUTTER ABLATION N/A 01/19/2015   Procedure: ATRIAL FLUTTER ABLATION;  Surgeon: [name], MD;  Location: [facility] CATH LAB;  Service: Cardiovascular;  Laterality: N/A;  . CARDIAC CATHETERIZATION N/A 05/02/2016   Procedure: Right/Left Heart Cath and Coronary Angiography;  Surgeon: Larey Dresser, MD;  Location: [place] CV LAB;  Service: Cardiovascular;  Laterality: N/A;  . COLONOSCOPY N/A 12/29/2013   Procedure: COLONOSCOPY;  Surgeon: Lafayette Dragon, MD;  Location: [facility] ENDOSCOPY;  Service: Endoscopy;  Laterality: N/A;  . EP IMPLANTABLE DEVICE N/A 06/05/2016   MDT CRTD implanted by Dr Rayann Heman   . [place]   left. Muscle reattachment  . TOE SURGERY Right 12/2012   2nd toe  . TONSILLECTOMY      Current Outpatient Prescriptions  Medication Sig  Dispense Refill  . Ascorbic Acid (VITAMIN C) 500 MG CAPS Take 1 capsule by mouth 2 (two) times daily.     . benzonatate (TESSALON) 200 MG capsule Take 200 mg by mouth 3 (three) times daily.    . carvedilol (COREG) 25 MG tablet TAKE ONE TABLET BY MOUTH TWICE DAILY WITH MEALS 180 tablet 3  . CINNAMON PO Take 1 tablet by mouth every evening. Reported on 11/13/2015    . digoxin (LANOXIN) 0.125 MG tablet Take 0.125 mg by mouth daily.     . diphenhydramine-acetaminophen (TYLENOL PM) 25-500 MG TABS Take 2 tablets by mouth at bedtime as needed (sleep).     . ENTRESTO 49-51 MG TAKE ONE TABLET BY MOUTH TWICE DAILY 60 tablet 3  . folic acid (FOLVITE) 1 MG tablet Take 1 mg by mouth daily.    . insulin NPH Human (NOVOLIN N) 100 UNIT/ML injection Inject 0.85 mLs (85 Units total) into the skin daily before breakfast. 30 mL 2  . insulin NPH-regular Human (NOVOLIN 70/30) (70-30) 100 UNIT/ML injection Inject 25 Units into the skin daily with supper. 20 mL 11  . methotrexate 2.5 MG tablet Take 7.5 mg by mouth See admin instructions. Take 5 mg every Wednesday morning ONLY    . Multiple Vitamin (MULTIVITAMIN) tablet Take 1 tablet by mouth daily.      . rivaroxaban (XARELTO) 20 MG TABS tablet Take 1 tablet (20 mg total) by mouth daily with supper. Resume 06/07/16 30 tablet 3  . simvastatin (ZOCOR) 40 MG tablet Take 40 mg by mouth  at bedtime.      . tamsulosin (FLOMAX) 0.4 MG CAPS capsule Take 0.4 mg by mouth daily.     Marland Kitchen torsemide (DEMADEX) 10 MG tablet TAKE ONE TABLET BY MOUTH ONCE DAILY 30 tablet 3  . XARELTO 20 MG TABS tablet TAKE ONE TABLET BY MOUTH ONCE DAILY WITH  SUPPER 30 tablet 3   No current facility-administered medications for this visit.     Allergies:   Aspirin; Lisinopril; and Penicillins   Social History: Social History   Social History  . Marital status: Married    Spouse name: Rosemarie Beath  . Number of children: 1  . Years of education: 27   Occupational History  . retired    Social  History Main Topics  . Smoking status: Former Smoker    Types: Cigarettes  . Smokeless tobacco: Never Used  . Alcohol use No  . Drug use: No  . Sexual activity: Not on file   Other Topics Concern  . Not on file   Social History Narrative   Regular exercise-no          Family History: Family History  Problem Relation Age of Onset  . Heart failure Maternal Grandmother   . Diabetes type II Sister 53  . Hyperlipidemia Other     Parent  . Stroke Other     Parent  . Hypertension Other     Parent  . Diabetes Other     Parent  . Colon cancer Neg Hx   . Heart attack Neg Hx     Review of Systems: All other systems reviewed and are otherwise negative except as noted above.   Physical Exam: VS:  BP 110/68   Pulse 76   Ht 6' (1.829 m)   Wt 235 lb 9.6 oz (106.9 kg)   BMI 31.95 kg/m  , BMI Body mass index is 31.95 kg/m.  GEN- The patient is elderly appearing, alert and oriented x 3 today.   HEENT: normocephalic, atraumatic; sclera clear, conjunctiva pink; hearing intact; oropharynx clear; neck supple Lungs- Clear to ausculation bilaterally, normal work of breathing.  No wheezes, rales, rhonchi Heart- Regular rate and rhythm GI- soft, non-tender, non-distended, bowel sounds present Extremities- no clubbing, cyanosis, or edema MS- no significant deformity or atrophy Skin- warm and dry, no rash or lesion; ICD pocket well healed Psych- euthymic mood, full affect Neuro- strength and sensation are intact  ICD interrogation- reviewed in detail today,  See PACEART report  EKG:  EKG is ordered today. The ekg ordered today shows sinus rhythm with ventricular pacing  Recent Labs: 04/30/2016: B Natriuretic Peptide 45.6; TSH 2.437 05/29/2016: BUN 15; Creat 1.26; Hemoglobin 12.8; Platelets 141; Potassium 4.6; Sodium 141   Wt Readings from Last 3 Encounters:  07/23/16 235 lb 9.6 oz (106.9 kg)  07/07/16 236 lb 3.2 oz (107.1 kg)  06/05/16 239 lb 8 oz (108.6 kg)     Other studies  Reviewed: Additional studies/ records that were reviewed today include: hospital records   Assessment and Plan:  1.  Chronic systolic dysfunction euvolemic today Stable on an appropriate medical regimen Symptomatically improved post CRTD implant Normal ICD function See Pace Art report No changes today BMET, Dig level today Echo 11/2016 6 months post CRT Enrolled in [facility] clinic today   2.  HTN Stable No change required today  3.  Obesity Body mass index is 31.95 kg/m. Weight loss encouraged   4.  Paroxysmal atrial fibrillation S/p flutter ablation Burden 0% by device  interrogation today Continue Xarelto for CHADS2VASC of 4   Current medicines are reviewed at length with the patient today.   The patient does not have concerns regarding his medicines.  The following changes were made today:  none  Labs/ tests ordered today include: BMET, Dig level, CBC echo 11/2016 Orders Placed This Encounter  Procedures  . CBC w/Diff  . Basic metabolic panel  . Digoxin level  . EKG 12-Lead  . ECHOCARDIOGRAM COMPLETE     Disposition:   Follow up with AHF clinic as scheduled, Dr Rayann Heman as scheduled, ICM clinic     Signed, Chanetta Marshall, NP 07/23/2016 9:54 AM  [facility] [address] [place] [address] [phone] (office) [phone] (fax)

## 2016-07-23 ENCOUNTER — Ambulatory Visit (INDEPENDENT_AMBULATORY_CARE_PROVIDER_SITE_OTHER): Payer: Medicare Other | Admitting: Nurse Practitioner

## 2016-07-23 ENCOUNTER — Encounter: Payer: Self-pay | Admitting: Internal Medicine

## 2016-07-23 ENCOUNTER — Encounter: Payer: Self-pay | Admitting: Nurse Practitioner

## 2016-07-23 VITALS — BP 110/68 | HR 76 | Ht 72.0 in | Wt 235.6 lb

## 2016-07-23 DIAGNOSIS — I480 Paroxysmal atrial fibrillation: Secondary | ICD-10-CM | POA: Diagnosis not present

## 2016-07-23 DIAGNOSIS — I5022 Chronic systolic (congestive) heart failure: Secondary | ICD-10-CM

## 2016-07-23 DIAGNOSIS — E669 Obesity, unspecified: Secondary | ICD-10-CM | POA: Diagnosis not present

## 2016-07-23 DIAGNOSIS — I10 Essential (primary) hypertension: Secondary | ICD-10-CM

## 2016-07-23 DIAGNOSIS — Z79899 Other long term (current) drug therapy: Secondary | ICD-10-CM

## 2016-07-23 LAB — CBC WITH DIFFERENTIAL/PLATELET
BASOS ABS: 67 {cells}/uL (ref 0–200)
Basophils Relative: 1 %
Eosinophils Absolute: 268 cells/uL (ref 15–500)
Eosinophils Relative: 4 %
HEMATOCRIT: 40.2 % (ref 38.5–50.0)
HEMOGLOBIN: 13.9 g/dL (ref 13.2–17.1)
LYMPHS ABS: 1608 {cells}/uL (ref 850–3900)
Lymphocytes Relative: 24 %
MCH: 31.7 pg (ref 27.0–33.0)
MCHC: 34.6 g/dL (ref 32.0–36.0)
MCV: 91.6 fL (ref 80.0–100.0)
MONO ABS: 670 {cells}/uL (ref 200–950)
MPV: 11.7 fL (ref 7.5–12.5)
Monocytes Relative: 10 %
NEUTROS PCT: 61 %
Neutro Abs: 4087 cells/uL (ref 1500–7800)
Platelets: 164 10*3/uL (ref 140–400)
RBC: 4.39 MIL/uL (ref 4.20–5.80)
RDW: 14.7 % (ref 11.0–15.0)
WBC: 6.7 10*3/uL (ref 3.8–10.8)

## 2016-07-23 LAB — BASIC METABOLIC PANEL
BUN: 19 mg/dL (ref 7–25)
CALCIUM: 10.1 mg/dL (ref 8.6–10.3)
CO2: 26 mmol/L (ref 20–31)
Chloride: 95 mmol/L — ABNORMAL LOW (ref 98–110)
Creat: 1.52 mg/dL — ABNORMAL HIGH (ref 0.70–1.18)
GLUCOSE: 170 mg/dL — AB (ref 65–99)
Potassium: 4.5 mmol/L (ref 3.5–5.3)
Sodium: 136 mmol/L (ref 135–146)

## 2016-07-23 LAB — DIGOXIN LEVEL: DIGOXIN LVL: 0.5 ug/L — AB (ref 0.8–2.0)

## 2016-07-23 NOTE — Patient Instructions (Addendum)
Medication Instructions:  Your physician recommends that you continue on your current medications as directed. Please refer to the Current Medication list given to you today.  Labwork: Your physician recommends that you have lab work today- CBC, BMET, and Dig Level  Testing/Procedures: Your physician has requested that you have an echocardiogram in January. Echocardiography is a painless test that uses sound waves to create images of your heart. It provides your doctor with information about the size and shape of your heart and how well your heart's chambers and valves are working. This procedure takes approximately one hour. There are no restrictions for this procedure.  Follow-Up: Your physician recommends that you schedule a follow-up appointment after echo in January with Dr. Aundra Dubin in the heart failure clinic.   Any Other Special Instructions Will Be Listed Below (If Applicable).     If you need a refill on your cardiac medications before your next appointment, please call your pharmacy.

## 2016-07-24 ENCOUNTER — Telehealth: Payer: Self-pay

## 2016-07-24 DIAGNOSIS — R7989 Other specified abnormal findings of blood chemistry: Secondary | ICD-10-CM

## 2016-07-24 MED ORDER — TORSEMIDE 10 MG PO TABS: 10.0000 mg | ORAL_TABLET | ORAL | 3 refills | Status: DC

## 2016-07-24 NOTE — Telephone Encounter (Signed)
Patient aware of results. Updated patient's medication list, and ordered BMET for patient to have done next week with [facility].

## 2016-07-24 NOTE — Telephone Encounter (Signed)
-----   Message from Patsey Berthold, NP sent at 07/24/2016  7:39 AM EDT ----- Please notify patient of lab results. Discussed with Dr Aundra Dubin - decrease Torsemide to every other day and repeat BMET 1 week. Thanks!

## 2016-07-25 LAB — CUP PACEART INCLINIC DEVICE CHECK
Date Time Interrogation Session: 20170901081922
Implantable Lead Implant Date: 20170713
Implantable Lead Implant Date: 20170713
Implantable Lead Location: 753859
Implantable Lead Model: 4598
MDC IDC LEAD IMPLANT DT: 20170713
MDC IDC LEAD LOCATION: 753858
MDC IDC LEAD LOCATION: 753860

## 2016-07-31 ENCOUNTER — Telehealth: Payer: Self-pay | Admitting: Nurse Practitioner

## 2016-07-31 NOTE — Telephone Encounter (Signed)
New message         Pt went to [facility] in [place] to have labs drawn but they did not have the order.  Please refax order to [phone] for lab.  Pt will go again tomorrow morning for labs.

## 2016-07-31 NOTE — Telephone Encounter (Signed)
REFAXED LAB REQUISTION COMFORMATION SAID SENT ON ALL PAGES

## 2016-08-01 ENCOUNTER — Other Ambulatory Visit: Payer: Self-pay | Admitting: Internal Medicine

## 2016-08-01 ENCOUNTER — Encounter: Payer: Self-pay | Admitting: Internal Medicine

## 2016-08-01 DIAGNOSIS — R748 Abnormal levels of other serum enzymes: Secondary | ICD-10-CM | POA: Diagnosis not present

## 2016-08-02 LAB — BASIC METABOLIC PANEL
BUN / CREAT RATIO: 13 (ref 10–24)
BUN: 16 mg/dL (ref 8–27)
CO2: 27 mmol/L (ref 18–29)
Calcium: 9.2 mg/dL (ref 8.6–10.2)
Chloride: 99 mmol/L (ref 96–106)
Creatinine, Ser: 1.21 mg/dL (ref 0.76–1.27)
GFR calc non Af Amer: 59 mL/min/{1.73_m2} — ABNORMAL LOW (ref >59)
GFR, EST AFRICAN AMERICAN: 68 mL/min/{1.73_m2} (ref >59)
Glucose: 190 mg/dL — ABNORMAL HIGH (ref 65–99)
POTASSIUM: 5 mmol/L (ref 3.5–5.2)
SODIUM: 138 mmol/L (ref 134–144)

## 2016-08-04 ENCOUNTER — Ambulatory Visit (INDEPENDENT_AMBULATORY_CARE_PROVIDER_SITE_OTHER): Payer: Medicare Other | Admitting: Podiatry

## 2016-08-04 ENCOUNTER — Encounter: Payer: Self-pay | Admitting: Podiatry

## 2016-08-04 DIAGNOSIS — B351 Tinea unguium: Secondary | ICD-10-CM

## 2016-08-04 DIAGNOSIS — E1140 Type 2 diabetes mellitus with diabetic neuropathy, unspecified: Secondary | ICD-10-CM

## 2016-08-04 DIAGNOSIS — M79676 Pain in unspecified toe(s): Secondary | ICD-10-CM | POA: Diagnosis not present

## 2016-08-04 DIAGNOSIS — Z0000 Encounter for general adult medical examination without abnormal findings: Secondary | ICD-10-CM | POA: Diagnosis not present

## 2016-08-04 NOTE — Progress Notes (Signed)
Patient ID: JAHKI WITHAM, male   DOB: 05/18/1941, 75 y.o.   MRN: [id number] Complaint:  Visit Type: Patient returns to my office for continued preventative foot care services. Complaint: Patient states" my nails have grown long and thick and become painful to walk and wear shoes" Patient has been diagnosed with DM with no foot complications. The patient presents for preventative foot care services. No changes to ROS.  Healing heel ulcer left foot.  Podiatric Exam: Vascular: dorsalis pedis and posterior tibial pulses are palpable bilateral. Capillary return is immediate. Temperature gradient is WNL. Skin turgor WNL  Sensorium: Diminished Semmes Weinstein monofilament test. Normal tactile sensation bilaterally. Nail Exam: Pt has thick disfigured discolored nails with subungual debris noted bilateral entire nail hallux through fifth toenails Ulcer Exam: There is no evidence of ulcer or pre-ulcerative changes or infection. Orthopedic Exam: Muscle tone and strength are WNL. No limitations in general ROM. No crepitus or effusions noted. Foot type and digits show no abnormalities. Bony prominences are unremarkable. Skin: No Porokeratosis. No infection or ulcers.  Asymptomatic porokeratosis sub 4th left foot.  Diagnosis:  Onychomycosis, , Pain in right toe, pain in left toes Diabetes with neuropathy  Treatment & Plan Procedures and Treatment: Consent by patient was obtained for treatment procedures. The patient understood the discussion of treatment and procedures well. All questions were answered thoroughly reviewed. Debridement of mycotic and hypertrophic toenails, 1 through 5 bilateral and clearing of subungual debris. No ulceration, no infection noted.  Return Visit-Office Procedure: Patient instructed to return to the office for a follow up visit 3 months for continued evaluation and treatment.  Gardiner Barefoot DPM

## 2016-08-06 ENCOUNTER — Telehealth: Payer: Self-pay

## 2016-08-06 NOTE — Telephone Encounter (Signed)
Referred to ICM clinic by Chanetta Marshall, NP.  Attempted patient call for ICM intro and left message to return call.

## 2016-08-07 NOTE — Telephone Encounter (Signed)
Received call back from wife, Jackelyn Poling ([place]).  She requested to have her cell number, [phone], as the main number and can leave any messages on that number or home phone.  Provided ICM intro and explained ICM program.  She agreed to monthly ICM follow calls for patient and 1st ICM remote transmission scheduled for 09/02/2016.  Office appt with Dr Rayann Heman 09/08/2016.   She asked if patient should continue with Torsemide 10 mg every other day and they were waiting on the lab results from 9/08/01/2016.  She also thought, after the last visit with Chanetta Marshall, NP, 07/23/2016, the HF clinic would be calling to make an appointment.  I advised would follow up with Chanetta Marshall, NP today for recommendations.

## 2016-08-07 NOTE — Telephone Encounter (Signed)
Call back to patient after reviewing 08/01/2016 lab results with Chanetta Marshall, NP in the office.  Recommendation is to stay on Torsemide 10 mg every other day and provided her with lab results.  Explained the next HF clinic appointment would be 12/2016 and will determine if earlier appointment is needed at the time of the office visit with Dr Rayann Heman on 09/08/2016.  She verbalized understanding.  Provided ICM phone number and reviewed HF symptoms, encouraging her to call if patient develops any symptoms.

## 2016-08-18 ENCOUNTER — Telehealth: Payer: Self-pay | Admitting: Podiatry

## 2016-08-18 NOTE — Telephone Encounter (Signed)
Spoke to wife told her paperwork has been sent on multiple occasions and Dr Loanne Drilling has not signed.  Told her we have been trying to get him to sign paperwork for other patients and he has stated he will not sign.  She requested that I mail her the paperwork and she will had deliver it to him in October.  I told her I would happily mail it.

## 2016-08-18 NOTE — Telephone Encounter (Signed)
pts wife called checking on the status of patients diabetic shoes. She said Dr Loanne Drilling is the doctor that treats his diabetes and they have not received any forms to fill out for the diabetic shoes. This was originally started in June she thinks.Can we check to make sure it went to Dr Loanne Drilling. His office number is [phone]

## 2016-09-02 ENCOUNTER — Telehealth: Payer: Self-pay | Admitting: Cardiology

## 2016-09-02 ENCOUNTER — Ambulatory Visit (INDEPENDENT_AMBULATORY_CARE_PROVIDER_SITE_OTHER): Payer: Medicare Other

## 2016-09-02 DIAGNOSIS — Z95810 Presence of automatic (implantable) cardiac defibrillator: Secondary | ICD-10-CM

## 2016-09-02 DIAGNOSIS — I5022 Chronic systolic (congestive) heart failure: Secondary | ICD-10-CM | POA: Diagnosis not present

## 2016-09-02 NOTE — Progress Notes (Signed)
EPIC Encounter for ICM Monitoring  Patient Name: Justin Hill is a 75 y.o. male Date: 09/02/2016 Primary Care Physican: Cyndy Freeze, MD Primary Cardiologist: Aundra Dubin Electrophysiologist: Allred Dry Weight: 238 lb (does not weigh daily) Bi-V Pacing:  94.6%       1st encounter.  Spoke with wife (DPR).  Heart Failure questions reviewed, pt asymptomatic for fluid symptoms today.  He did have slight swelling of feet/ankles last week but has resolved   Thoracic impedance trending to baseline after being abnormal suggesting fluid accumulation in the last week.    Recommendations: No changes.  Discussed low sodium diet and to limit to 2000 mg daily.    Confirmed he is taking Torsemide 10 mg every other day.  He is moving home monitor to bedroom so will send transmission automatically when scheduled.   Follow-up plan: ICM clinic phone appointment on 10/10/2016.  Office appointment with Dr Rayann Heman on 09/08/2016.  Copy of ICM check sent to primary cardiologist and device physician.   ICM trend: 09/02/2016       Rosalene Billings, RN 09/02/2016 12:30 PM

## 2016-09-02 NOTE — Telephone Encounter (Signed)
LMOVM reminding pt to send remote transmission.   

## 2016-09-04 ENCOUNTER — Telehealth (HOSPITAL_COMMUNITY): Payer: Self-pay | Admitting: Cardiology

## 2016-09-04 NOTE — Telephone Encounter (Signed)
Message sent to CHF scheduler to assist with appt

## 2016-09-04 NOTE — Telephone Encounter (Signed)
-----   Message from Larey Dresser, MD sent at 09/02/2016  9:39 PM EDT ----- Make sure this patient has followup to see me at some point.  ----- Message ----- From: Rosalene Billings, RN Sent: 09/02/2016  12:51 PM To: Larey Dresser, MD

## 2016-09-08 ENCOUNTER — Ambulatory Visit (INDEPENDENT_AMBULATORY_CARE_PROVIDER_SITE_OTHER): Payer: Medicare Other | Admitting: Internal Medicine

## 2016-09-08 ENCOUNTER — Encounter: Payer: Self-pay | Admitting: Internal Medicine

## 2016-09-08 VITALS — BP 128/76 | HR 75 | Ht 71.0 in | Wt 238.2 lb

## 2016-09-08 DIAGNOSIS — I480 Paroxysmal atrial fibrillation: Secondary | ICD-10-CM

## 2016-09-08 DIAGNOSIS — Z95810 Presence of automatic (implantable) cardiac defibrillator: Secondary | ICD-10-CM

## 2016-09-08 DIAGNOSIS — I5022 Chronic systolic (congestive) heart failure: Secondary | ICD-10-CM | POA: Diagnosis not present

## 2016-09-08 NOTE — Progress Notes (Signed)
PCP: Cyndy Freeze, MD Primary Cardiologist:  Justin Hill is a 75 y.o. male who presents today for routine electrophysiology followup.  Since his BiV ICD implant, the patient reports doing very well.  Energy is improved.  He says "I no longer feel like I have a buik sitting on my shoulders".  Today, he denies symptoms of palpitations, chest pain, shortness of breath,  lower extremity edema, dizziness, presyncope, syncope, or ICD shocks.  The patient is otherwise without complaint today.   Past Medical History:  Diagnosis Date  . Cardiomyopathy   . CHF (congestive heart failure) ([place])   . Diabetes mellitus   . Diverticulosis   . Hypercholesterolemia   . Hypertension   . Paroxysmal atrial fibrillation ([facility])   . Personal history of kidney stones    Past Surgical History:  Procedure Laterality Date  . ATRIAL FLUTTER ABLATION N/A 01/19/2015   Procedure: ATRIAL FLUTTER ABLATION;  Surgeon: [name], MD;  Location: [facility] CATH LAB;  Service: Cardiovascular;  Laterality: N/A;  . CARDIAC CATHETERIZATION N/A 05/02/2016   Procedure: Right/Left Heart Cath and Coronary Angiography;  Surgeon: Larey Dresser, MD;  Location: [facility] CV LAB;  Service: Cardiovascular;  Laterality: N/A;  . COLONOSCOPY N/A 12/29/2013   Procedure: COLONOSCOPY;  Surgeon: Lafayette Dragon, MD;  Location: [facility] ENDOSCOPY;  Service: Endoscopy;  Laterality: N/A;  . EP IMPLANTABLE DEVICE N/A 06/05/2016   MDT CRTD implanted by Dr Rayann Heman   . [place]   left. Muscle reattachment  . TOE SURGERY Right 12/2012   2nd toe  . TONSILLECTOMY      ROS- all systems are reviewed and negative except as per HPI above  Current Outpatient Prescriptions  Medication Sig Dispense Refill  . Ascorbic Acid (VITAMIN C) 500 MG CAPS Take 1 capsule by mouth 2 (two) times daily.     . benzonatate (TESSALON) 200 MG capsule Take 200 mg by mouth as directed.     . carvedilol (COREG) 25 MG tablet TAKE ONE TABLET BY MOUTH TWICE DAILY  WITH MEALS 180 tablet 3  . CINNAMON PO Take 1 tablet by mouth every evening. Reported on 11/13/2015    . digoxin (LANOXIN) 0.125 MG tablet Take 0.125 mg by mouth daily.     . diphenhydramine-acetaminophen (TYLENOL PM) 25-500 MG TABS Take 2 tablets by mouth at bedtime as needed (sleep).     . ENTRESTO 49-51 MG TAKE ONE TABLET BY MOUTH TWICE DAILY 60 tablet 3  . folic acid (FOLVITE) 1 MG tablet Take 1 mg by mouth daily.    . insulin NPH Human (NOVOLIN N) 100 UNIT/ML injection Inject 0.85 mLs (85 Units total) into the skin daily before breakfast. 30 mL 2  . insulin NPH-regular Human (NOVOLIN 70/30) (70-30) 100 UNIT/ML injection Inject 25 Units into the skin daily with supper. 20 mL 11  . methotrexate 2.5 MG tablet Take 2.5 mg by mouth See admin instructions. Take 5 mg every Wednesday morning ONLY    . Multiple Vitamin (MULTIVITAMIN) tablet Take 1 tablet by mouth daily.      . rivaroxaban (XARELTO) 20 MG TABS tablet Take 1 tablet (20 mg total) by mouth daily with supper. Resume 06/07/16 30 tablet 3  . simvastatin (ZOCOR) 40 MG tablet Take 40 mg by mouth at bedtime.      . tamsulosin (FLOMAX) 0.4 MG CAPS capsule Take 0.4 mg by mouth daily.     Marland Kitchen torsemide (DEMADEX) 10 MG tablet Take 1 tablet (10 mg total) by  mouth every other day. 30 tablet 3  . XARELTO 20 MG TABS tablet TAKE ONE TABLET BY MOUTH ONCE DAILY WITH  SUPPER 30 tablet 3   No current facility-administered medications for this visit.     Physical Exam: Vitals:   09/08/16 1145  BP: 128/76  Pulse: 75  Weight: 238 lb 3.2 oz (108 kg)  Height: 5\' 11"  (1.803 m)    GEN- The patient is well appearing, alert and oriented x 3 today.   Head- normocephalic, atraumatic Eyes-  Sclera clear, conjunctiva pink Ears- hearing intact Oropharynx- clear Lungs- Clear to ausculation bilaterally, normal work of breathing Chest- ICD pocket is well healed Heart- Regular rate and rhythm, no murmurs, rubs or gallops, PMI not laterally displaced GI- soft,  NT, ND, + BS Extremities- no clubbing, cyanosis, or edema  ekg today reveals sinus rhythm 75 bpm, BIV pacing (QRS 124 msec) ICD interrogation- reviewed in detail today,  See PACEART report  Assessment and Plan:  1.  Chronic systolic dysfunction euvolemic today Stable on an appropriate medical regimen Normal ICD function See Pace Art report No changes today  Repeat echo in 3 months to assess response to CRT Follow-up with EP NP in 6 months for further device optimization (if needed based on echo)  2.  Paroxysmal atrial fibrillation Well controlled On xarelto   Sherrian Divers MD, [facility] 09/08/2016 12:18 PM

## 2016-09-08 NOTE — Patient Instructions (Signed)
Medication Instructions:  Your physician recommends that you continue on your current medications as directed. Please refer to the Current Medication list given to you today.   Labwork: None ordered   Testing/Procedures: None ordered   Follow-Up: Your physician wants you to follow-up in: 6 months with Chanetta Marshall, NP You will receive a reminder letter in the mail two months in advance. If you don't receive a letter, please call our office to schedule the follow-up appointment.   Remote monitoring is used to monitor your  ICD from home. This monitoring reduces the number of office visits required to check your device to one time per year. It allows [place] to keep an eye on the functioning of your device to ensure it is working properly. You are scheduled for a device check from home on 12/08/16. You may send your transmission at any time that day. If you have a wireless device, the transmission will be sent automatically. After your physician reviews your transmission, you will receive a postcard with your next transmission date.    Any Other Special Instructions Will Be Listed Below (If Applicable).     If you need a refill on your cardiac medications before your next appointment, please call your pharmacy.

## 2016-09-18 ENCOUNTER — Encounter: Payer: Self-pay | Admitting: Endocrinology

## 2016-09-18 DIAGNOSIS — E113299 Type 2 diabetes mellitus with mild nonproliferative diabetic retinopathy without macular edema, unspecified eye: Secondary | ICD-10-CM | POA: Diagnosis not present

## 2016-09-18 DIAGNOSIS — H25812 Combined forms of age-related cataract, left eye: Secondary | ICD-10-CM | POA: Diagnosis not present

## 2016-09-18 LAB — HM DIABETES EYE EXAM

## 2016-09-25 DIAGNOSIS — E1122 Type 2 diabetes mellitus with diabetic chronic kidney disease: Secondary | ICD-10-CM | POA: Diagnosis not present

## 2016-09-25 DIAGNOSIS — E08610 Diabetes mellitus due to underlying condition with diabetic neuropathic arthropathy: Secondary | ICD-10-CM | POA: Diagnosis not present

## 2016-09-30 DIAGNOSIS — N401 Enlarged prostate with lower urinary tract symptoms: Secondary | ICD-10-CM | POA: Diagnosis not present

## 2016-09-30 DIAGNOSIS — N183 Chronic kidney disease, stage 3 (moderate): Secondary | ICD-10-CM | POA: Diagnosis not present

## 2016-09-30 DIAGNOSIS — Z794 Long term (current) use of insulin: Secondary | ICD-10-CM | POA: Diagnosis not present

## 2016-09-30 DIAGNOSIS — E1122 Type 2 diabetes mellitus with diabetic chronic kidney disease: Secondary | ICD-10-CM | POA: Diagnosis not present

## 2016-09-30 DIAGNOSIS — Z0000 Encounter for general adult medical examination without abnormal findings: Secondary | ICD-10-CM | POA: Diagnosis not present

## 2016-10-03 ENCOUNTER — Ambulatory Visit (HOSPITAL_COMMUNITY)
Admission: RE | Admit: 2016-10-03 | Discharge: 2016-10-03 | Disposition: A | Discharge status: Home / Self Care | Payer: Medicare Other | Source: Ambulatory Visit | Attending: Cardiology | Admitting: Cardiology

## 2016-10-03 ENCOUNTER — Encounter (HOSPITAL_COMMUNITY): Payer: Self-pay

## 2016-10-03 VITALS — BP 154/70 | HR 78 | Ht 71.0 in | Wt 245.8 lb

## 2016-10-03 DIAGNOSIS — Z794 Long term (current) use of insulin: Secondary | ICD-10-CM | POA: Insufficient documentation

## 2016-10-03 DIAGNOSIS — I447 Left bundle-branch block, unspecified: Secondary | ICD-10-CM | POA: Diagnosis not present

## 2016-10-03 DIAGNOSIS — E785 Hyperlipidemia, unspecified: Secondary | ICD-10-CM | POA: Diagnosis not present

## 2016-10-03 DIAGNOSIS — N189 Chronic kidney disease, unspecified: Secondary | ICD-10-CM | POA: Insufficient documentation

## 2016-10-03 DIAGNOSIS — E1122 Type 2 diabetes mellitus with diabetic chronic kidney disease: Secondary | ICD-10-CM | POA: Insufficient documentation

## 2016-10-03 DIAGNOSIS — I428 Other cardiomyopathies: Secondary | ICD-10-CM | POA: Insufficient documentation

## 2016-10-03 DIAGNOSIS — I519 Heart disease, unspecified: Secondary | ICD-10-CM

## 2016-10-03 DIAGNOSIS — I5022 Chronic systolic (congestive) heart failure: Secondary | ICD-10-CM

## 2016-10-03 DIAGNOSIS — I130 Hypertensive heart and chronic kidney disease with heart failure and stage 1 through stage 4 chronic kidney disease, or unspecified chronic kidney disease: Secondary | ICD-10-CM | POA: Insufficient documentation

## 2016-10-03 DIAGNOSIS — Z79899 Other long term (current) drug therapy: Secondary | ICD-10-CM | POA: Insufficient documentation

## 2016-10-03 DIAGNOSIS — I480 Paroxysmal atrial fibrillation: Secondary | ICD-10-CM | POA: Diagnosis not present

## 2016-10-03 DIAGNOSIS — Z7901 Long term (current) use of anticoagulants: Secondary | ICD-10-CM | POA: Diagnosis not present

## 2016-10-03 DIAGNOSIS — I4892 Unspecified atrial flutter: Secondary | ICD-10-CM | POA: Insufficient documentation

## 2016-10-03 LAB — BASIC METABOLIC PANEL
ANION GAP: 8 (ref 5–15)
BUN: 16 mg/dL (ref 6–20)
CO2: 28 mmol/L (ref 22–32)
Calcium: 9.6 mg/dL (ref 8.9–10.3)
Chloride: 99 mmol/L — ABNORMAL LOW (ref 101–111)
Creatinine, Ser: 1.27 mg/dL — ABNORMAL HIGH (ref 0.61–1.24)
GFR calc Af Amer: >60 mL/min (ref >60)
GFR calc non Af Amer: 54 mL/min — ABNORMAL LOW (ref >60)
GLUCOSE: 215 mg/dL — AB (ref 65–99)
POTASSIUM: 4.9 mmol/L (ref 3.5–5.1)
Sodium: 135 mmol/L (ref 135–145)

## 2016-10-03 LAB — CBC
HCT: 41.4 % (ref 39.0–52.0)
HEMOGLOBIN: 14.3 g/dL (ref 13.0–17.0)
MCH: 31.7 pg (ref 26.0–34.0)
MCHC: 34.5 g/dL (ref 30.0–36.0)
MCV: 91.8 fL (ref 78.0–100.0)
Platelets: 132 10*3/uL — ABNORMAL LOW (ref 150–400)
RBC: 4.51 MIL/uL (ref 4.22–5.81)
RDW: 14.2 % (ref 11.5–15.5)
WBC: 6.6 10*3/uL (ref 4.0–10.5)

## 2016-10-03 LAB — DIGOXIN LEVEL: Digoxin Level: 0.3 ng/mL — ABNORMAL LOW (ref 0.8–2.0)

## 2016-10-03 NOTE — Patient Instructions (Signed)
Labs today We will only contact you if something comes back abnormal or we need to make some changes. Otherwise no news is good news!  Your physician recommends that you schedule a follow-up appointment in: 2 months (after your echo in January)   Do the following things EVERYDAY: 1) Weigh yourself in the morning before breakfast. Write it down and keep it in a log. 2) Take your medicines as prescribed 3) Eat low salt foods-Limit salt (sodium) to 2000 mg per day.  4) Stay as active as you can everyday 5) Limit all fluids for the day to less than 2 liters

## 2016-10-05 NOTE — Progress Notes (Signed)
Patient ID: Justin Hill, male   DOB: Jun 20, 1941, 75 y.o.   MRN: [id number] PCP: Dr. Camillia Herter Cardiology: Dr. Aundra Dubin  75 yo with history CKD, HTN, diabetes, chronic LBBB, and nonischemic cardiomyopathy presents for cardiology followup.  His cardiomyopathy has been known for years.  Most recent echo was in 6/16 showed stable EF 25-30% with septal-lateral dyssynchrony. He had a LHC in 2008 showing mild nonobstructive CAD.     Justin Hill was admitted in 2/16 with atrial flutter degenerating into atrial fibrillation.  He was initially cardioverted, then atrial flutter recurred.  He had atrial flutter ablation by Dr Rayann Heman in 2/16.  He thinks that he has been in NSR since that time, no further palpitations. He is on Xarelto now with no melena or BRBPR.    He is now s/p placement of Medtronic CRT-D device.  He feels better with BiV pacing.  No exertional dyspnea or chest pain.  No orthopnea/PND.  He is able to do moderate exertion like sweeping/cleaning up with no problems.  BP high today but SBP runs in the 120s at home.     Labs (5/14): K 4, creatinine 1.4, LFTs normal, LDL 46, HDL 37 Labs (1/15): K 4.4, creatinine 1.46, LDL 74, HDL 35 Labs (4/15); LDL 96, HDL 44, K 5.2, creatinine 1.4 Labs (1/16): K 4.9, creatinine 1.36 Labs (2/16): K 4.5, creatinine 1.56, BNP 412 Labs (9/16): K 4.8, creatinine 1.33, hgb 14.7 Labs (11/16): K 4.3, creatinine 1.37, BNP 52 Labs (1/17): K 4.5 => 5, creatinine 1.35 => 1.17, HCT 47.4 Labs (2/17): K 4.9, creatinine 1.35 Labs (5/17): LDL 48, LFTs normal Labs (9/17): K 5, creatinine 1.21  PMH: 1. Type II diabetes with peripheral neuropathy.  2. CKD 3. Hyperlipidemia 4. HTN: ACEI cough.  5. Nonischemic cardiomyopathy: This was diagnosed prior to 2008.  In 2008, patient had LHC with only mild nonobstructive coronary disease.  Echo in 1/14 showed EF 25-30% with normal RV.  Patient has not tolerated ACEI due to hyperkalemia and AKI. ICD was discussed (Dr Lovena Le) and  decided against given NYHA class I symptoms.  Echo (7/15) with EF 25-30%, septal-lateral dyssynchrony, mild LV dilation. Echo (6/16) with EF 25-30%, mild LVH, septal-lateral dyssynchrony, normal RV size and systolic function. CPX (12/16) with peak VO2 14.5 (69% predicted), VE/VCO2 34, RER 1.19 => mild functional impairment from heart failure, restrictive lung function likely due to body habitus.  - LHC/RHC (6/17): 30% mid LAD stenosis; mean RA 5, PA 33/10, PCWP mean 16, CI 1.95.  - Medtronic CRT-D placed 7/17.  6. Chronic LBBB 7. Obesity 8. Atrial flutter and atrial fibrillation: S/p atrial flutter ablation in 2/16 ([facility]).   SH: Lives in [place], married, prior smoker, no ETOH.  1 child.   FH: Grandmother with CHF.  Mother with CVA.   ROS: All systems reviewed and negative except as per HPI.    Current Outpatient Prescriptions  Medication Sig Dispense Refill  . Ascorbic Acid (VITAMIN C) 500 MG CAPS Take 1 capsule by mouth 2 (two) times daily.     . benzonatate (TESSALON) 200 MG capsule Take 200 mg by mouth as directed.     . carvedilol (COREG) 25 MG tablet TAKE ONE TABLET BY MOUTH TWICE DAILY WITH MEALS 180 tablet 3  . CINNAMON PO Take 1 tablet by mouth every evening. Reported on 11/13/2015    . digoxin (LANOXIN) 0.125 MG tablet Take 0.125 mg by mouth daily.     . diphenhydramine-acetaminophen (TYLENOL PM) 25-500 MG TABS Take  2 tablets by mouth at bedtime as needed (sleep).     . ENTRESTO 49-51 MG TAKE ONE TABLET BY MOUTH TWICE DAILY 60 tablet 3  . folic acid (FOLVITE) 1 MG tablet Take 1 mg by mouth daily.    . insulin NPH Human (NOVOLIN N) 100 UNIT/ML injection Inject 0.85 mLs (85 Units total) into the skin daily before breakfast. 30 mL 2  . insulin NPH-regular Human (NOVOLIN 70/30) (70-30) 100 UNIT/ML injection Inject 25 Units into the skin daily with supper. 20 mL 11  . methotrexate 2.5 MG tablet Take 2.5 mg by mouth See admin instructions. Take 5 mg every Wednesday morning ONLY    .  Multiple Vitamin (MULTIVITAMIN) tablet Take 1 tablet by mouth daily.      . rivaroxaban (XARELTO) 20 MG TABS tablet Take 1 tablet (20 mg total) by mouth daily with supper. Resume 06/07/16 30 tablet 3  . simvastatin (ZOCOR) 40 MG tablet Take 40 mg by mouth at bedtime.      . tamsulosin (FLOMAX) 0.4 MG CAPS capsule Take 0.4 mg by mouth daily.     Marland Kitchen torsemide (DEMADEX) 10 MG tablet Take 1 tablet (10 mg total) by mouth every other day. 30 tablet 3  . XARELTO 20 MG TABS tablet TAKE ONE TABLET BY MOUTH ONCE DAILY WITH  SUPPER 30 tablet 3   No current facility-administered medications for this encounter.     BP (!) 154/70 (BP Location: Left Arm, Patient Position: Sitting, Cuff Size: Normal)   Pulse 78   Ht 5\' 11"  (1.803 m)   Wt 245 lb 12.8 oz (111.5 kg)   SpO2 92%   BMI 34.28 kg/m  General: NAD, obese Neck: Thick, JVP 7 cm, no thyromegaly or thyroid nodule.  Lungs: Clear to auscultation bilaterally with normal respiratory effort. CV: Nondisplaced PMI.  Heart regular S1/S2, no S3/S4, no murmur.  1+ ankle edema bilaterally.  No carotid bruit.  Normal pedal pulses.  Abdomen: Soft, nontender, no hepatosplenomegaly, no distention.  Skin: Intact without lesions or rashes.  Neurologic: Alert and oriented x 3.  Psych: Normal affect. Extremities: No clubbing or cyanosis.   Assessment/Plan: 1. Cardiomyopathy: Nonischemic.  Low EF x years. Echo 6/16 showed stable EF 25-30% with septal-lateral dyssynchrony.  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  Interestingly, it also appears that he has a long-standing LBBB.  A chronic LBBB itself can potentially cause a cardiomyopathy and may be the source of his low EF.   Coronary angiography in 6/17 with nonobstructive disease. He has NYHA class II symptoms, not significantly volume overloaded on exam.  Now s/p Medtronic CRT-D device placement, feels better. - Continue Coreg, he is at goal dose. -  Continue Entresto 49/51 bid.  We discussed increasing Entresto today but he wants to wait until after his repeat echo in 1/18.  - I would like to add spironolactone but K has been upper normal to high.  Will check BMET today, if not high will consider addition of spironolactone 12.5 daily => K was 5, no spironolactone. .   - Continue torsemide 10 mg every other day.   - Continue digoxin, check level today.  - Echo in 1/18 to assess response to CRT.  - BMET today.  2. Hyperlipidemia: Patient is on simvastatin with good lipids in 5/17.  3. CKD: BMET today.  4. Atrial flutter: s/p ablation, in NSR today.  5. Atrial fibrillation: Patient had afib  noted as well as flutter.  Therefore, I think that he should stay on Xarelto long-term.  CBC today.   Followup after echo in 1/18.    Loralie Champagne 10/05/2016

## 2016-10-08 ENCOUNTER — Encounter: Payer: Self-pay | Admitting: Endocrinology

## 2016-10-08 ENCOUNTER — Ambulatory Visit (INDEPENDENT_AMBULATORY_CARE_PROVIDER_SITE_OTHER): Payer: Medicare Other | Admitting: Endocrinology

## 2016-10-08 VITALS — BP 130/80 | HR 87 | Ht 71.0 in | Wt 244.0 lb

## 2016-10-08 DIAGNOSIS — E1122 Type 2 diabetes mellitus with diabetic chronic kidney disease: Secondary | ICD-10-CM

## 2016-10-08 DIAGNOSIS — N183 Chronic kidney disease, stage 3 unspecified: Secondary | ICD-10-CM

## 2016-10-08 DIAGNOSIS — Z794 Long term (current) use of insulin: Secondary | ICD-10-CM

## 2016-10-08 NOTE — Progress Notes (Signed)
Subjective:    Patient ID: Justin Hill, male    DOB: [birth year], 75 y.o.   MRN: [id number]  HPI Pt returns for f/u of diabetes mellitus: DM type: Insulin-requiring type 2. Dx'ed: 9629 Complications: CHF, toe ulcer, partial toe amputation, polyneuropathy, and renal insufficiency.  Therapy: insulin since 2012.  DKA: never.   Severe hypoglycemia: never.    Pancreatitis: never.   Other: he takes human insulin for cost reasons; in October of 2014, he was changed from multiple daily injections to BID, due to persistently high a1c.  The pattern of cbg's indicates he needs NPH in AM and 70/30 with dinner.  Interval history: no cbg record, but states cbg's vary from 128-178, but most are in the low to mid-100's. There is no trend throughout the day.  pt states he feels well in general.   Past Medical History:  Diagnosis Date  . Cardiomyopathy   . CHF (congestive heart failure) ([place])   . Diabetes mellitus   . Diverticulosis   . Hypercholesterolemia   . Hypertension   . Paroxysmal atrial fibrillation ([facility])   . Personal history of kidney stones     Past Surgical History:  Procedure Laterality Date  . ATRIAL FLUTTER ABLATION N/A 01/19/2015   Procedure: ATRIAL FLUTTER ABLATION;  Surgeon: [name], MD;  Location: [facility] CATH LAB;  Service: Cardiovascular;  Laterality: N/A;  . CARDIAC CATHETERIZATION N/A 05/02/2016   Procedure: Right/Left Heart Cath and Coronary Angiography;  Surgeon: Larey Dresser, MD;  Location: [facility] CV LAB;  Service: Cardiovascular;  Laterality: N/A;  . COLONOSCOPY N/A 12/29/2013   Procedure: COLONOSCOPY;  Surgeon: Lafayette Dragon, MD;  Location: [facility] ENDOSCOPY;  Service: Endoscopy;  Laterality: N/A;  . EP IMPLANTABLE DEVICE N/A 06/05/2016   MDT Hillery Aldo XT CRTD implanted by Dr Rayann Heman   . [place]   left. Muscle reattachment  . TOE SURGERY Right 12/2012   2nd toe  . TONSILLECTOMY      Social History   Social History  . Marital status: Married    Spouse name: Rosemarie Beath  . Number of children: 1  . Years of education: 74   Occupational History  . retired    Social History Main Topics  . Smoking status: Former Smoker    Types: Cigarettes  . Smokeless tobacco: Never Used  . Alcohol use No  . Drug use: No  . Sexual activity: Not on file   Other Topics Concern  . Not on file   Social History Narrative   Regular exercise-no          Current Outpatient Prescriptions on File Prior to Visit  Medication Sig Dispense Refill  . Ascorbic Acid (VITAMIN C) 500 MG CAPS Take 1 capsule by mouth 2 (two) times daily.     . benzonatate (TESSALON) 200 MG capsule Take 200 mg by mouth as directed.     . carvedilol (COREG) 25 MG tablet TAKE ONE TABLET BY MOUTH TWICE DAILY WITH MEALS 180 tablet 3  . CINNAMON PO Take 1 tablet by mouth every evening. Reported on 11/13/2015    . digoxin (LANOXIN) 0.125 MG tablet Take 0.125 mg by mouth daily.     . diphenhydramine-acetaminophen (TYLENOL PM) 25-500 MG TABS Take 2 tablets by mouth at bedtime as needed (sleep).     . ENTRESTO 49-51 MG TAKE ONE TABLET BY MOUTH TWICE DAILY 60 tablet 3  . folic acid (FOLVITE) 1 MG tablet Take 1 mg by mouth daily.    Marland Kitchen  insulin NPH Human (NOVOLIN N) 100 UNIT/ML injection Inject 0.85 mLs (85 Units total) into the skin daily before breakfast. 30 mL 2  . methotrexate 2.5 MG tablet Take 2.5 mg by mouth See admin instructions. Take 5 mg every Wednesday morning ONLY    . Multiple Vitamin (MULTIVITAMIN) tablet Take 1 tablet by mouth daily.      . rivaroxaban (XARELTO) 20 MG TABS tablet Take 1 tablet (20 mg total) by mouth daily with supper. Resume 06/07/16 30 tablet 3  . simvastatin (ZOCOR) 40 MG tablet Take 40 mg by mouth at bedtime.      . tamsulosin (FLOMAX) 0.4 MG CAPS capsule Take 0.4 mg by mouth daily.     Marland Kitchen torsemide (DEMADEX) 10 MG tablet Take 1 tablet (10 mg total) by mouth every other day. 30 tablet 3  . XARELTO 20 MG TABS tablet TAKE ONE TABLET BY MOUTH ONCE DAILY WITH   SUPPER 30 tablet 3   No current facility-administered medications on file prior to visit.     Allergies  Allergen Reactions  . Aspirin Other (See Comments)    Burps, burning, stomach pains, etc  . Lisinopril Cough    Severe coughing  . Penicillins Other (See Comments)    syncope    Family History  Problem Relation Age of Onset  . Heart failure Maternal Grandmother   . Diabetes type II Sister 55  . Hyperlipidemia Other     Parent  . Stroke Other     Parent  . Hypertension Other     Parent  . Diabetes Other     Parent  . Colon cancer Neg Hx   . Heart attack Neg Hx    BP 130/80   Pulse 87   Ht 5\' 11"  (1.803 m)   Wt 244 lb (110.7 kg)   SpO2 97%   BMI 34.03 kg/m   Review of Systems He denies hypoglycemia.      Objective:   Physical Exam VITAL SIGNS:  See vs page GENERAL: no distress EXTEMITIES: no deformity, except for hammer toes on the left foot. feet are of normal temp.   SKIN: no ulcer.   CV: 1+ bilat leg edema. There is bilateral onychomycosis, rust-colored discoloration, and varicosities.   PULSES: dorsalis pedis intact bilat.  NEURO: sensation is intact to touch on the feet, but decreased from normal.   Lab Results  Component Value Date   HGBA1C 6.7 07/07/2016      Assessment & Plan:  Insulin-requiring type 2 DM, with renal insufficiency, overcontrolled, given this regimen, which does match insulin to his changing needs throughout the day.    Patient is advised the following: Patient Instructions  Please continue the same morning insulin: 85 units of NPH (50 if you are going to be active).   In the evening, take just 25 units of "70/30."  You should take this with supper, rather than at bedtime.   On this type of insulin schedule, you should eat meals on a regular schedule.  If a meal is missed or significantly delayed, your blood sugar could go low.   check your blood sugar twice a day.  vary the time of day when you check, between before the 3  meals, and at bedtime.  also check if you have symptoms of your blood sugar being too high or too low.  please keep a record of the readings and bring it to your next appointment here.  please call us sooner if your blood sugar  goes below 70, or if you have a lot of readings over 200.   If you have any blood sugar under 80, please write on the paper why you think that was.   A diabetes blood test is requested for you today.  We'll let you know about the results.  Please come back for a follow-up appointment in 3 months.

## 2016-10-08 NOTE — Patient Instructions (Signed)
Please continue the same morning insulin: 85 units of NPH (50 if you are going to be active).   In the evening, take just 25 units of "70/30."  You should take this with supper, rather than at bedtime.   On this type of insulin schedule, you should eat meals on a regular schedule.  If a meal is missed or significantly delayed, your blood sugar could go low.   check your blood sugar twice a day.  vary the time of day when you check, between before the 3 meals, and at bedtime.  also check if you have symptoms of your blood sugar being too high or too low.  please keep a record of the readings and bring it to your next appointment here.  please call us sooner if your blood sugar goes below 70, or if you have a lot of readings over 200.   If you have any blood sugar under 80, please write on the paper why you think that was.   A diabetes blood test is requested for you today.  We'll let you know about the results.  Please come back for a follow-up appointment in 3 months.

## 2016-10-10 ENCOUNTER — Ambulatory Visit (INDEPENDENT_AMBULATORY_CARE_PROVIDER_SITE_OTHER): Payer: Medicare Other

## 2016-10-10 DIAGNOSIS — I5022 Chronic systolic (congestive) heart failure: Secondary | ICD-10-CM | POA: Diagnosis not present

## 2016-10-10 DIAGNOSIS — Z95810 Presence of automatic (implantable) cardiac defibrillator: Secondary | ICD-10-CM

## 2016-10-10 LAB — FRUCTOSAMINE: FRUCTOSAMINE: 302 umol/L — AB (ref 190–270)

## 2016-10-10 MED ORDER — INSULIN NPH ISOPHANE & REGULAR (70-30) 100 UNIT/ML ~~LOC~~ SUSP: 20.0000 [IU] | Freq: Every day | SUBCUTANEOUS | 11 refills | Status: DC

## 2016-10-13 ENCOUNTER — Telehealth: Payer: Self-pay

## 2016-10-13 NOTE — Progress Notes (Signed)
EPIC Encounter for ICM Monitoring  Patient Name: Justin Hill is a 75 y.o. male Date: 10/13/2016 Primary Care Physican: Cyndy Freeze, MD Primary Cardiologist: Aundra Dubin Electrophysiologist: Allred Dry Weight:    236 lb (does not weigh daily) Bi-V Pacing:  95.8%              Spoke with wife.  Heart Failure questions reviewed, pt asymptomatic   Thoracic impedance normal   Labs: 10/03/2016 Creatinine 1327, BUN 16, Potassium 4.9, Sodium 135, EGFR 54->60  08/01/2016 Creatinine 1.21, BUN 16, Potassium 5.0, Sodium 138, EGFR 59-68  07/23/2016 Creatinine 1.52, BUN 19, Potassium 4.5, Sodium 136  05/29/2016 Creatinine 1.26, BUN 15, Potassium 4.6, Sodium 141  04/30/2016 Creatinine 1.17, BUN 9, Potassium 4.9, Sodium 138  12/28/2015 Creatinine 1.35, BUN 13, Potassium 4.9, Sodium 142  10/24/2015 Creatinine 1.37, BUN 20, Potassium 4.3, Sodium 139    Recommendations: No changes.  Reinforced low salt food choices and limiting fluid intake to < 2 liters per day. Encouraged to call for fluid symptoms.    Follow-up plan: ICM clinic phone appointment on 11/13/2016.  Copy of ICM check sent to device physician.   ICM trend: 10/10/2016       Rosalene Billings, RN 10/13/2016 10:03 AM

## 2016-10-13 NOTE — Telephone Encounter (Signed)
Remote ICM transmission received.  Attempted patient call and left message to return call.   

## 2016-11-03 ENCOUNTER — Encounter: Payer: Self-pay | Admitting: Podiatry

## 2016-11-03 ENCOUNTER — Ambulatory Visit (INDEPENDENT_AMBULATORY_CARE_PROVIDER_SITE_OTHER): Payer: Medicare Other | Admitting: Podiatry

## 2016-11-03 VITALS — Ht 71.0 in | Wt 244.0 lb

## 2016-11-03 DIAGNOSIS — E1140 Type 2 diabetes mellitus with diabetic neuropathy, unspecified: Secondary | ICD-10-CM | POA: Diagnosis not present

## 2016-11-03 DIAGNOSIS — Q828 Other specified congenital malformations of skin: Secondary | ICD-10-CM | POA: Diagnosis not present

## 2016-11-03 DIAGNOSIS — M79676 Pain in unspecified toe(s): Secondary | ICD-10-CM

## 2016-11-03 DIAGNOSIS — B351 Tinea unguium: Secondary | ICD-10-CM

## 2016-11-03 NOTE — Progress Notes (Signed)
Patient ID: Justin Hill, male   DOB: 07-21-1941, 75 y.o.   MRN: [id number] Complaint:  Visit Type: Patient returns to my office for continued preventative foot care services. Complaint: Patient states" my nails have grown long and thick and become painful to walk and wear shoes" Patient has been diagnosed with DM with no foot complications. The patient presents for preventative foot care services. No changes to ROS.  Healing heel ulcer left foot.  Podiatric Exam: Vascular: dorsalis pedis and posterior tibial pulses are palpable bilateral. Capillary return is immediate. Temperature gradient is WNL. Skin turgor WNL  Sensorium: Diminished Semmes Weinstein monofilament test. Normal tactile sensation bilaterally. Nail Exam: Pt has thick disfigured discolored nails with subungual debris noted bilateral entire nail hallux through fifth toenails Ulcer Exam: There is no evidence of ulcer or pre-ulcerative changes or infection. Orthopedic Exam: Muscle tone and strength are WNL. No limitations in general ROM. No crepitus or effusions noted. Foot type and digits show no abnormalities. Bony prominences are unremarkable. Skin: No Porokeratosis. No infection or ulcers.  Asymptomatic porokeratosis sub 4th left foot.  Diagnosis:  Onychomycosis, , Pain in right toe, pain in left toes Diabetes with neuropathy  Porokeratosis sub 4 left   Treatment & Plan Procedures and Treatment: Consent by patient was obtained for treatment procedures. The patient understood the discussion of treatment and procedures well. All questions were answered thoroughly reviewed. Debridement of mycotic and hypertrophic toenails, 1 through 5 bilateral and clearing of subungual debris. No ulceration, no infection noted. Debride Porokeratosis Return Visit-Office Procedure: Patient instructed to return to the office for a follow up visit 3 months for continued evaluation and treatment.  Gardiner Barefoot DPM

## 2016-11-11 DIAGNOSIS — L853 Xerosis cutis: Secondary | ICD-10-CM | POA: Diagnosis not present

## 2016-11-11 DIAGNOSIS — L300 Nummular dermatitis: Secondary | ICD-10-CM | POA: Diagnosis not present

## 2016-11-11 DIAGNOSIS — L299 Pruritus, unspecified: Secondary | ICD-10-CM | POA: Diagnosis not present

## 2016-11-13 ENCOUNTER — Ambulatory Visit: Payer: Medicare Other

## 2016-11-13 DIAGNOSIS — I5022 Chronic systolic (congestive) heart failure: Secondary | ICD-10-CM

## 2016-11-13 DIAGNOSIS — Z95810 Presence of automatic (implantable) cardiac defibrillator: Secondary | ICD-10-CM

## 2016-11-13 NOTE — Progress Notes (Signed)
EPIC Encounter for ICM Monitoring  Patient Name: Justin Hill is a 75 y.o. male Date: 11/13/2016 Primary Care Physican: Cyndy Freeze, MD Primary Cardiologist:[place] Electrophysiologist: Allred Dry Weight:236lb (does not weigh daily) Bi-V Pacing: 95.8%  Clinical Status (10-Oct-2016 to 13-Nov-2016) AT/AF 35 episodes (was 3 episodes on 10/10/2016) Time in AT/AF 0.1 hr/day (0.6%) - Was 0.3% on 10/10/2016   OBSERVATIONS (10-Oct-2016 to 13-Nov-2016) 1 monitored VT episodes, longest was 3 min.  Longest ventricular sensing episode since the last session is greater than 60 seconds.  Ventricular sensing episodes averaged 8 min/day since the last session.  Event Summary (10-Oct-2016 to 13-Nov-2016) ?1 Monitored VT  ?166 V. Sensing Episodes  ?2 VT-NS  ?5 hours in AT/AF Since Last Session    Spoke with wife and patient. Heart Failure questions reviewed, pt asymptomatic.  He stated he is feeling really well and does not have any complaints.   Thoracic impedance normal.    Labs: 10/03/2016 Creatinine 1.27, BUN 16, Potassium 4.9, Sodium 135, EGFR 54->60  08/01/2016 Creatinine 1.21, BUN 16, Potassium 5.0, Sodium 138, EGFR 59-68  07/23/2016 Creatinine 1.52, BUN 19, Potassium 4.5, Sodium 136  05/29/2016 Creatinine 1.26, BUN 15, Potassium 4.6, Sodium 141  04/30/2016 Creatinine 1.17, BUN 9, Potassium 4.9, Sodium 138  12/28/2015 Creatinine 1.35, BUN 13, Potassium 4.9, Sodium 142  10/24/2015 Creatinine 1.37, BUN 20, Potassium 4.3, Sodium 139   Recommendations: No changes.  Reinforced to limit low salt food choices to 2000 mg day and limiting fluid intake to < 2 liters per day. Encouraged to call for fluid symptoms.    Follow-up plan: ICM clinic phone appointment on 12/17/2015.  HF office appointment on 12/02/2016  Copy of ICM check sent to primary cardiologist and device physician.   ICM trend: 11/13/2016       Rosalene Billings, RN 11/13/2016 5:03 PM

## 2016-11-21 LAB — CUP PACEART INCLINIC DEVICE CHECK
Brady Statistic AP VP Percent: 8.44 %
Brady Statistic AP VS Percent: 0.27 %
Brady Statistic AS VP Percent: 89.72 %
Brady Statistic RV Percent Paced: 3.89 %
HighPow Impedance: 73 Ohm
Implantable Lead Implant Date: 20170713
Implantable Lead Implant Date: 20170713
Implantable Lead Location: 753858
Implantable Lead Location: 753860
Implantable Lead Model: 5076
Implantable Pulse Generator Implant Date: 20170713
Lead Channel Impedance Value: 361 Ohm
Lead Channel Impedance Value: 418 Ohm
Lead Channel Impedance Value: 608 Ohm
Lead Channel Impedance Value: 779 Ohm
Lead Channel Pacing Threshold Amplitude: 1 V
Lead Channel Pacing Threshold Pulse Width: 0.4 ms
Lead Channel Sensing Intrinsic Amplitude: 1.375 mV
Lead Channel Sensing Intrinsic Amplitude: 11.875 mV
Lead Channel Setting Pacing Amplitude: 2.5 V
Lead Channel Setting Pacing Amplitude: 3.5 V
MDC IDC LEAD IMPLANT DT: 20170713
MDC IDC LEAD LOCATION: 753859
MDC IDC LEAD MODEL: 4598
MDC IDC MSMT BATTERY REMAINING LONGEVITY: 85 mo
MDC IDC MSMT BATTERY VOLTAGE: 3.04 V
MDC IDC MSMT LEADCHNL LV IMPEDANCE VALUE: 418 Ohm
MDC IDC MSMT LEADCHNL LV IMPEDANCE VALUE: 513 Ohm
MDC IDC MSMT LEADCHNL LV IMPEDANCE VALUE: 608 Ohm
MDC IDC MSMT LEADCHNL LV IMPEDANCE VALUE: 646 Ohm
MDC IDC MSMT LEADCHNL LV IMPEDANCE VALUE: 893 Ohm
MDC IDC MSMT LEADCHNL LV IMPEDANCE VALUE: 950 Ohm
MDC IDC MSMT LEADCHNL LV PACING THRESHOLD AMPLITUDE: 2.25 V
MDC IDC MSMT LEADCHNL LV PACING THRESHOLD PULSEWIDTH: 0.8 ms
MDC IDC MSMT LEADCHNL RA IMPEDANCE VALUE: 456 Ohm
MDC IDC MSMT LEADCHNL RA PACING THRESHOLD PULSEWIDTH: 0.4 ms
MDC IDC MSMT LEADCHNL RV IMPEDANCE VALUE: 418 Ohm
MDC IDC MSMT LEADCHNL RV IMPEDANCE VALUE: 475 Ohm
MDC IDC MSMT LEADCHNL RV PACING THRESHOLD AMPLITUDE: 0.5 V
MDC IDC SESS DTM: 20171016160554
MDC IDC SET LEADCHNL LV PACING PULSEWIDTH: 0.8 ms
MDC IDC SET LEADCHNL RA PACING AMPLITUDE: 2 V
MDC IDC SET LEADCHNL RV PACING PULSEWIDTH: 0.4 ms
MDC IDC SET LEADCHNL RV SENSING SENSITIVITY: 0.3 mV
MDC IDC STAT BRADY AS VS PERCENT: 1.56 %
MDC IDC STAT BRADY RA PERCENT PACED: 8.47 %

## 2016-11-25 ENCOUNTER — Ambulatory Visit (HOSPITAL_COMMUNITY): Payer: Medicare Other | Attending: Cardiovascular Disease

## 2016-11-25 ENCOUNTER — Other Ambulatory Visit: Payer: Self-pay

## 2016-11-25 DIAGNOSIS — E669 Obesity, unspecified: Secondary | ICD-10-CM | POA: Insufficient documentation

## 2016-11-25 DIAGNOSIS — Z6834 Body mass index (BMI) 34.0-34.9, adult: Secondary | ICD-10-CM | POA: Diagnosis not present

## 2016-11-25 DIAGNOSIS — I5022 Chronic systolic (congestive) heart failure: Secondary | ICD-10-CM | POA: Insufficient documentation

## 2016-11-25 DIAGNOSIS — I110 Hypertensive heart disease with heart failure: Secondary | ICD-10-CM | POA: Diagnosis not present

## 2016-11-25 DIAGNOSIS — I509 Heart failure, unspecified: Secondary | ICD-10-CM | POA: Diagnosis present

## 2016-11-25 DIAGNOSIS — I429 Cardiomyopathy, unspecified: Secondary | ICD-10-CM | POA: Insufficient documentation

## 2016-11-25 DIAGNOSIS — I10 Essential (primary) hypertension: Secondary | ICD-10-CM | POA: Diagnosis not present

## 2016-11-25 DIAGNOSIS — E7800 Pure hypercholesterolemia, unspecified: Secondary | ICD-10-CM | POA: Diagnosis not present

## 2016-11-25 DIAGNOSIS — I480 Paroxysmal atrial fibrillation: Secondary | ICD-10-CM

## 2016-11-26 ENCOUNTER — Encounter: Payer: Self-pay | Admitting: Sports Medicine

## 2016-11-26 ENCOUNTER — Ambulatory Visit (INDEPENDENT_AMBULATORY_CARE_PROVIDER_SITE_OTHER): Payer: Self-pay | Admitting: Sports Medicine

## 2016-11-26 DIAGNOSIS — Q828 Other specified congenital malformations of skin: Secondary | ICD-10-CM

## 2016-11-26 DIAGNOSIS — E1140 Type 2 diabetes mellitus with diabetic neuropathy, unspecified: Secondary | ICD-10-CM

## 2016-11-26 NOTE — Progress Notes (Signed)
Patient discussed with CMA. Foam impressions were obtained and diabetic shoes were selected. Office to order and will contact patient to return when diabetic shoes and inserts have arrive.  Patient to follow up as scheduled. -Dr. Cannon Kettle

## 2016-12-02 ENCOUNTER — Ambulatory Visit (HOSPITAL_COMMUNITY)
Admission: RE | Admit: 2016-12-02 | Discharge: 2016-12-02 | Disposition: A | Discharge status: Home / Self Care | Payer: Medicare Other | Source: Ambulatory Visit | Attending: Cardiology | Admitting: Cardiology

## 2016-12-02 ENCOUNTER — Encounter (HOSPITAL_COMMUNITY): Payer: Self-pay

## 2016-12-02 VITALS — BP 126/84 | HR 78 | Wt 246.8 lb

## 2016-12-02 DIAGNOSIS — Z79899 Other long term (current) drug therapy: Secondary | ICD-10-CM | POA: Diagnosis not present

## 2016-12-02 DIAGNOSIS — I4891 Unspecified atrial fibrillation: Secondary | ICD-10-CM | POA: Diagnosis not present

## 2016-12-02 DIAGNOSIS — Z794 Long term (current) use of insulin: Secondary | ICD-10-CM | POA: Diagnosis not present

## 2016-12-02 DIAGNOSIS — Z9889 Other specified postprocedural states: Secondary | ICD-10-CM | POA: Diagnosis not present

## 2016-12-02 DIAGNOSIS — I480 Paroxysmal atrial fibrillation: Secondary | ICD-10-CM

## 2016-12-02 DIAGNOSIS — E785 Hyperlipidemia, unspecified: Secondary | ICD-10-CM | POA: Diagnosis not present

## 2016-12-02 DIAGNOSIS — I4892 Unspecified atrial flutter: Secondary | ICD-10-CM | POA: Insufficient documentation

## 2016-12-02 DIAGNOSIS — I447 Left bundle-branch block, unspecified: Secondary | ICD-10-CM

## 2016-12-02 DIAGNOSIS — I5022 Chronic systolic (congestive) heart failure: Secondary | ICD-10-CM | POA: Diagnosis not present

## 2016-12-02 DIAGNOSIS — I428 Other cardiomyopathies: Secondary | ICD-10-CM | POA: Insufficient documentation

## 2016-12-02 DIAGNOSIS — Z7901 Long term (current) use of anticoagulants: Secondary | ICD-10-CM | POA: Diagnosis not present

## 2016-12-02 DIAGNOSIS — N189 Chronic kidney disease, unspecified: Secondary | ICD-10-CM | POA: Insufficient documentation

## 2016-12-02 DIAGNOSIS — E1122 Type 2 diabetes mellitus with diabetic chronic kidney disease: Secondary | ICD-10-CM | POA: Insufficient documentation

## 2016-12-02 DIAGNOSIS — I130 Hypertensive heart and chronic kidney disease with heart failure and stage 1 through stage 4 chronic kidney disease, or unspecified chronic kidney disease: Secondary | ICD-10-CM | POA: Insufficient documentation

## 2016-12-02 LAB — CBC
HCT: 39.4 % (ref 39.0–52.0)
HEMOGLOBIN: 13.5 g/dL (ref 13.0–17.0)
MCH: 31.5 pg (ref 26.0–34.0)
MCHC: 34.3 g/dL (ref 30.0–36.0)
MCV: 92.1 fL (ref 78.0–100.0)
Platelets: 126 10*3/uL — ABNORMAL LOW (ref 150–400)
RBC: 4.28 MIL/uL (ref 4.22–5.81)
RDW: 14.7 % (ref 11.5–15.5)
WBC: 5.9 10*3/uL (ref 4.0–10.5)

## 2016-12-02 LAB — BASIC METABOLIC PANEL
ANION GAP: 7 (ref 5–15)
BUN: 14 mg/dL (ref 6–20)
CHLORIDE: 104 mmol/L (ref 101–111)
CO2: 27 mmol/L (ref 22–32)
CREATININE: 1.2 mg/dL (ref 0.61–1.24)
Calcium: 9.3 mg/dL (ref 8.9–10.3)
GFR calc Af Amer: >60 mL/min (ref >60)
GFR calc non Af Amer: 58 mL/min — ABNORMAL LOW (ref >60)
GLUCOSE: 272 mg/dL — AB (ref 65–99)
Potassium: 4.9 mmol/L (ref 3.5–5.1)
Sodium: 138 mmol/L (ref 135–145)

## 2016-12-02 LAB — DIGOXIN LEVEL: DIGOXIN LVL: 0.4 ng/mL — AB (ref 0.8–2.0)

## 2016-12-02 MED ORDER — SACUBITRIL-VALSARTAN 97-103 MG PO TABS: 1.0000 | ORAL_TABLET | Freq: Two times a day (BID) | ORAL | 3 refills | Status: DC

## 2016-12-02 NOTE — Patient Instructions (Signed)
Increase Entresto to 97/103 mg Twice daily   Labs today  Labs in 2 weeks at [facility]  We will contact you in 6 months to schedule your next appointment.

## 2016-12-03 NOTE — Progress Notes (Signed)
Patient ID: Justin Hill, male   DOB: 1941/03/28, 76 y.o.   MRN: [id number] PCP: Dr. Camillia Herter Cardiology: Dr. Aundra Dubin  76 yo with history CKD, HTN, diabetes, chronic LBBB, and nonischemic cardiomyopathy presents for cardiology followup.  His cardiomyopathy has been known for years.  Most recent echo was in 6/16 showed stable EF 25-30% with septal-lateral dyssynchrony. He had a LHC in 2008 showing mild nonobstructive CAD.    Mr Schappert was admitted in 2/16 with atrial flutter degenerating into atrial fibrillation.  He was initially cardioverted, then atrial flutter recurred.  He had atrial flutter ablation by Dr Rayann Heman in 2/16.  He thinks that he has been in NSR since that time, no further palpitations. He is on Xarelto now with no melena or BRBPR.    He is s/p placement of Medtronic CRT-D device.  He feels better with BiV pacing.  No exertional dyspnea or chest pain.  No orthopnea/PND.  No BRBPR/melena.  No palpitations.  Echo in 1/18 showed improvement in EF to 40-45%.    Optivol: fluid index < threshold, stable impedance, no VT, had atrial fibrillation for about 4 hrs in early 12/17.    Labs (5/14): K 4, creatinine 1.4, LFTs normal, LDL 46, HDL 37 Labs (1/15): K 4.4, creatinine 1.46, LDL 74, HDL 35 Labs (4/15); LDL 96, HDL 44, K 5.2, creatinine 1.4 Labs (1/16): K 4.9, creatinine 1.36 Labs (2/16): K 4.5, creatinine 1.56, BNP 412 Labs (9/16): K 4.8, creatinine 1.33, hgb 14.7 Labs (11/16): K 4.3, creatinine 1.37, BNP 52 Labs (1/17): K 4.5 => 5, creatinine 1.35 => 1.17, HCT 47.4 Labs (2/17): K 4.9, creatinine 1.35 Labs (5/17): LDL 48, LFTs normal Labs (9/17): K 5, creatinine 1.21 Labs (11/17): HCT 41.4, digoxin 0.3, K 4.9, creatinine 1.27  PMH: 1. Type II diabetes with peripheral neuropathy.  2. CKD 3. Hyperlipidemia 4. HTN: ACEI cough.  5. Nonischemic cardiomyopathy: This was diagnosed prior to 2008.  In 2008, patient had LHC with only mild nonobstructive coronary disease.  Echo in  1/14 showed EF 25-30% with normal RV.  Patient has not tolerated ACEI due to hyperkalemia and AKI. ICD was discussed (Dr Lovena Le) and decided against given NYHA class I symptoms.  Echo (7/15) with EF 25-30%, septal-lateral dyssynchrony, mild LV dilation. Echo (6/16) with EF 25-30%, mild LVH, septal-lateral dyssynchrony, normal RV size and systolic function. CPX (12/16) with peak VO2 14.5 (69% predicted), VE/VCO2 34, RER 1.19 => mild functional impairment from heart failure, restrictive lung function likely due to body habitus.  - LHC/RHC (6/17): 30% mid LAD stenosis; mean RA 5, PA 33/10, PCWP mean 16, CI 1.95.  - Medtronic CRT-D placed 7/17.  - Echo (1/18): EF 40-45%, mild LVH.  6. Chronic LBBB 7. Obesity 8. Atrial flutter and atrial fibrillation: S/p atrial flutter ablation in 2/16 (Allred).   SH: Lives in [place], married, prior smoker, no ETOH.  1 child.   FH: Grandmother with CHF.  Mother with CVA.   ROS: All systems reviewed and negative except as per HPI.    Current Outpatient Prescriptions  Medication Sig Dispense Refill  . Ascorbic Acid (VITAMIN C) 500 MG CAPS Take 1 capsule by mouth 2 (two) times daily.     . benzonatate (TESSALON) 200 MG capsule Take 200 mg by mouth as directed.     . carvedilol (COREG) 25 MG tablet TAKE ONE TABLET BY MOUTH TWICE DAILY WITH MEALS 180 tablet 3  . CINNAMON PO Take 1 tablet by mouth every evening. Reported on 11/13/2015    .  digoxin (LANOXIN) 0.125 MG tablet Take 0.125 mg by mouth daily.     . diphenhydramine-acetaminophen (TYLENOL PM) 25-500 MG TABS Take 2 tablets by mouth at bedtime as needed (sleep).     . folic acid (FOLVITE) 1 MG tablet Take 1 mg by mouth daily.    . insulin NPH Human (NOVOLIN N) 100 UNIT/ML injection Inject 0.85 mLs (85 Units total) into the skin daily before breakfast. 30 mL 2  . insulin NPH-regular Human (NOVOLIN 70/30) (70-30) 100 UNIT/ML injection Inject 20 Units into the skin daily with supper. 20 mL 11  . methotrexate 2.5  MG tablet Take 2.5 mg by mouth See admin instructions. Take 5 mg every Wednesday morning ONLY    . Multiple Vitamin (MULTIVITAMIN) tablet Take 1 tablet by mouth daily.      . rivaroxaban (XARELTO) 20 MG TABS tablet Take 1 tablet (20 mg total) by mouth daily with supper. Resume 06/07/16 30 tablet 3  . simvastatin (ZOCOR) 40 MG tablet Take 40 mg by mouth at bedtime.      . tamsulosin (FLOMAX) 0.4 MG CAPS capsule Take 0.4 mg by mouth daily.     Marland Kitchen torsemide (DEMADEX) 10 MG tablet Take 1 tablet (10 mg total) by mouth every other day. 30 tablet 3  . XARELTO 20 MG TABS tablet TAKE ONE TABLET BY MOUTH ONCE DAILY WITH  SUPPER 30 tablet 3  . sacubitril-valsartan (ENTRESTO) 97-103 MG Take 1 tablet by mouth 2 (two) times daily. 60 tablet 3   No current facility-administered medications for this encounter.     BP 126/84   Pulse 78   Wt 246 lb 12 oz (111.9 kg)   SpO2 98%   BMI 34.41 kg/m  General: NAD, obese Neck: Thick, JVP 7 cm, no thyromegaly or thyroid nodule.  Lungs: Clear to auscultation bilaterally with normal respiratory effort. CV: Nondisplaced PMI.  Heart regular S1/S2, no S3/S4, no murmur.  1+ ankle edema bilaterally.  No carotid bruit.  Normal pedal pulses.  Abdomen: Soft, nontender, no hepatosplenomegaly, no distention.  Skin: Intact without lesions or rashes.  Neurologic: Alert and oriented x 3.  Psych: Normal affect. Extremities: No clubbing or cyanosis.   Assessment/Plan: 1. Cardiomyopathy: Nonischemic.  Low EF x years. Echo 6/16 showed stable EF 25-30% with septal-lateral dyssynchrony.  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  Interestingly, it also appears that he has a long-standing LBBB.  A chronic LBBB itself can potentially cause a cardiomyopathy and may be the source of his low EF.   Coronary angiography in 6/17 with nonobstructive disease. Now s/p Medtronic CRT-D device placement, feels better.  Repeat echo in 1/18  with increase in EF to 40-45%.  NYHA class II symptoms, no volume overload by exam or Optivol.  - Continue Coreg, he is at goal dose. - Increase Entresto to 97/103 bid.  BMET in 2 wks.  - K has been high with spironolactone so now off.   - Continue torsemide 10 mg every other day.  BMET today.  - Continue digoxin, check level today.  2. Hyperlipidemia: Patient is on simvastatin with good lipids in 5/17.  3. CKD: BMET today.  4. Atrial flutter: s/p ablation, in NSR today.  5. Atrial fibrillation: Patient had afib noted as well as flutter.  Therefore, I think that he should stay on Xarelto long-term.  CBC today.  Optivol interrogation showed about 4 hrs of atrial fibrillation one day last  month.   Followup in 6 months.  Loralie Champagne 12/03/2016

## 2016-12-04 ENCOUNTER — Encounter: Payer: Self-pay | Admitting: Endocrinology

## 2016-12-08 ENCOUNTER — Ambulatory Visit (INDEPENDENT_AMBULATORY_CARE_PROVIDER_SITE_OTHER): Payer: Medicare Other | Admitting: *Deleted

## 2016-12-08 ENCOUNTER — Telehealth: Payer: Self-pay | Admitting: Cardiology

## 2016-12-08 DIAGNOSIS — I428 Other cardiomyopathies: Secondary | ICD-10-CM

## 2016-12-08 NOTE — Telephone Encounter (Signed)
LMOVM reminding pt to send remote transmission.   

## 2016-12-08 NOTE — Progress Notes (Signed)
Remote ICD transmission.   

## 2016-12-12 LAB — CUP PACEART REMOTE DEVICE CHECK
Battery Remaining Longevity: 84 mo
Battery Voltage: 3.02 V
Brady Statistic AP VP Percent: 8.23 %
Brady Statistic AS VP Percent: 90.29 %
Brady Statistic AS VS Percent: 1.32 %
Date Time Interrogation Session: 20180115211720
HIGH POWER IMPEDANCE MEASURED VALUE: 84 Ohm
Implantable Lead Implant Date: 20170713
Implantable Lead Location: 753859
Implantable Lead Location: 753860
Implantable Lead Model: 4598
Implantable Lead Model: 5076
Lead Channel Impedance Value: 456 Ohm
Lead Channel Impedance Value: 475 Ohm
Lead Channel Impedance Value: 551 Ohm
Lead Channel Impedance Value: 551 Ohm
Lead Channel Impedance Value: 703 Ohm
Lead Channel Impedance Value: 893 Ohm
Lead Channel Impedance Value: 893 Ohm
Lead Channel Impedance Value: 893 Ohm
Lead Channel Pacing Threshold Amplitude: 0.375 V
Lead Channel Pacing Threshold Amplitude: 0.5 V
Lead Channel Pacing Threshold Amplitude: 1.875 V
Lead Channel Pacing Threshold Pulse Width: 0.4 ms
Lead Channel Pacing Threshold Pulse Width: 0.8 ms
Lead Channel Sensing Intrinsic Amplitude: 0.75 mV
Lead Channel Setting Pacing Amplitude: 2 V
Lead Channel Setting Pacing Amplitude: 2.5 V
Lead Channel Setting Pacing Pulse Width: 0.4 ms
Lead Channel Setting Sensing Sensitivity: 0.3 mV
MDC IDC LEAD IMPLANT DT: 20170713
MDC IDC LEAD IMPLANT DT: 20170713
MDC IDC LEAD LOCATION: 753858
MDC IDC MSMT LEADCHNL LV IMPEDANCE VALUE: 361 Ohm
MDC IDC MSMT LEADCHNL LV IMPEDANCE VALUE: 418 Ohm
MDC IDC MSMT LEADCHNL LV IMPEDANCE VALUE: 532 Ohm
MDC IDC MSMT LEADCHNL LV IMPEDANCE VALUE: 703 Ohm
MDC IDC MSMT LEADCHNL RA IMPEDANCE VALUE: 532 Ohm
MDC IDC MSMT LEADCHNL RA SENSING INTR AMPL: 0.75 mV
MDC IDC MSMT LEADCHNL RV PACING THRESHOLD PULSEWIDTH: 0.4 ms
MDC IDC MSMT LEADCHNL RV SENSING INTR AMPL: 13.75 mV
MDC IDC MSMT LEADCHNL RV SENSING INTR AMPL: 13.75 mV
MDC IDC PG IMPLANT DT: 20170713
MDC IDC SET LEADCHNL LV PACING AMPLITUDE: 3.25 V
MDC IDC SET LEADCHNL LV PACING PULSEWIDTH: 0.8 ms
MDC IDC STAT BRADY AP VS PERCENT: 0.16 %
MDC IDC STAT BRADY RA PERCENT PACED: 8.18 %
MDC IDC STAT BRADY RV PERCENT PACED: 3.45 %

## 2016-12-16 ENCOUNTER — Ambulatory Visit (INDEPENDENT_AMBULATORY_CARE_PROVIDER_SITE_OTHER): Payer: Medicare Other

## 2016-12-16 DIAGNOSIS — Z95810 Presence of automatic (implantable) cardiac defibrillator: Secondary | ICD-10-CM

## 2016-12-16 DIAGNOSIS — I5022 Chronic systolic (congestive) heart failure: Secondary | ICD-10-CM | POA: Diagnosis not present

## 2016-12-17 ENCOUNTER — Encounter: Payer: Self-pay | Admitting: Cardiology

## 2016-12-18 ENCOUNTER — Other Ambulatory Visit: Payer: Self-pay | Admitting: Internal Medicine

## 2016-12-19 NOTE — Progress Notes (Signed)
EPIC Encounter for ICM Monitoring  Patient Name: Justin Hill is a 76 y.o. male Date: 12/19/2016 Primary Care Physican: Cyndy Freeze, MD Primary Cardiologist:[place] Electrophysiologist: Allred Dry Weight:236lb  Bi-V Pacing: 97.2%     Heart Failure questions reviewed, pt asymptomatic   Thoracic impedance normal   Labs: 12/02/2016 Creatinine 1.20, BUN 14, Potassium 4.9, Sodium 138, EGFR >60 10/03/2016 Creatinine 1.27, BUN 16, Potassium 4.9, Sodium 135, EGFR 54->60  08/01/2016 Creatinine 1.21, BUN 16, Potassium 5.0, Sodium 138, EGFR 59-68  07/23/2016 Creatinine 1.52, BUN 19, Potassium 4.5, Sodium 136  05/29/2016 Creatinine 1.26, BUN 15, Potassium 4.6, Sodium 141  04/30/2016 Creatinine 1.17, BUN 9, Potassium 4.9, Sodium 138  12/28/2015 Creatinine 1.35, BUN 13, Potassium 4.9, Sodium 142  11/30/2016Creatinine 1.37, BUN 20, Potassium 4.3, Sodium 139   Recommendations: No changes. Reminded to limit dietary salt intake to 2000 mg/day and fluid intake to < 2 liters/day. Encouraged to call for fluid symptoms.  Follow-up plan: ICM clinic phone appointment on 01/19/2017.  Copy of ICM check sent to device physician.   3 month ICM trend: 12/16/2016   1 Year ICM trend:      Rosalene Billings, RN 12/19/2016 12:02 PM

## 2016-12-22 ENCOUNTER — Other Ambulatory Visit: Payer: Self-pay | Admitting: Cardiology

## 2016-12-22 DIAGNOSIS — I5022 Chronic systolic (congestive) heart failure: Secondary | ICD-10-CM | POA: Diagnosis not present

## 2016-12-23 LAB — BASIC METABOLIC PANEL
BUN/Creatinine Ratio: 14 (ref 10–24)
BUN: 22 mg/dL (ref 8–27)
CO2: 25 mmol/L (ref 18–29)
CREATININE: 1.56 mg/dL — AB (ref 0.76–1.27)
Calcium: 9.2 mg/dL (ref 8.6–10.2)
Chloride: 97 mmol/L (ref 96–106)
GFR calc Af Amer: 50 mL/min/{1.73_m2} — ABNORMAL LOW (ref >59)
GFR calc non Af Amer: 43 mL/min/{1.73_m2} — ABNORMAL LOW (ref >59)
GLUCOSE: 199 mg/dL — AB (ref 65–99)
Potassium: 5 mmol/L (ref 3.5–5.2)
Sodium: 138 mmol/L (ref 134–144)

## 2016-12-24 ENCOUNTER — Telehealth: Payer: Self-pay | Admitting: Sports Medicine

## 2016-12-24 DIAGNOSIS — L299 Pruritus, unspecified: Secondary | ICD-10-CM | POA: Diagnosis not present

## 2016-12-24 DIAGNOSIS — C44112 Basal cell carcinoma of skin of right eyelid, including canthus: Secondary | ICD-10-CM | POA: Diagnosis not present

## 2016-12-24 DIAGNOSIS — L300 Nummular dermatitis: Secondary | ICD-10-CM | POA: Diagnosis not present

## 2016-12-24 NOTE — Telephone Encounter (Signed)
Patients wife returned your call about diabetic shoes. Please call her back.

## 2016-12-29 ENCOUNTER — Telehealth (HOSPITAL_COMMUNITY): Payer: Self-pay | Admitting: *Deleted

## 2016-12-29 DIAGNOSIS — I5022 Chronic systolic (congestive) heart failure: Secondary | ICD-10-CM

## 2016-12-29 NOTE — Telephone Encounter (Signed)
Basic metabolic panel  Order: [id number]  Status:  Final result Visible to patient:  Yes (MyChart)  Notes Recorded by Kennieth Rad, RN on 12/29/2016 at 3:08 PM EST Spoke with patient's wife, she is agreeable to bring him in for labs. Appt made and lab order placed. ------  Notes Recorded by Harvie Junior, CMA on 12/26/2016 at 2:31 PM EST Attempted to reach patient on preferred number no answer. No voicemail. Will try patient again later ------  Notes Recorded by Larey Dresser, MD on 12/25/2016 at 12:42 PM EST Creatinine a little higher. No changes for now but repeat BMET in 2 wks.

## 2017-01-07 ENCOUNTER — Ambulatory Visit (INDEPENDENT_AMBULATORY_CARE_PROVIDER_SITE_OTHER): Payer: Medicare Other | Admitting: Sports Medicine

## 2017-01-07 DIAGNOSIS — Q828 Other specified congenital malformations of skin: Secondary | ICD-10-CM | POA: Diagnosis not present

## 2017-01-07 DIAGNOSIS — E1140 Type 2 diabetes mellitus with diabetic neuropathy, unspecified: Secondary | ICD-10-CM | POA: Diagnosis not present

## 2017-01-07 NOTE — Progress Notes (Signed)
Patient met with Benjie Karvonen and Diabetic shoe and insoles dispensed to patient with wear instructions. Patient to follow-up as scheduled or sooner if problems or issues arise  Dr. Cannon Kettle

## 2017-01-08 ENCOUNTER — Telehealth: Payer: Self-pay | Admitting: Endocrinology

## 2017-01-08 NOTE — Telephone Encounter (Signed)
Spoke with patients wife and she stated an understanding- she thinks the high readings are because of having to use cough drops that he is eating a lot of lately- wants to know if she should increase the 70/30 or if she should try diabetic cough drops first for a few days to see if it helps- please advise

## 2017-01-08 NOTE — Telephone Encounter (Signed)
Blood sugar may be also high because of illness. Still recommend adding 20 units of 70/30 insulin in the morning while reducing the NPH down to 85 units for more consistent control

## 2017-01-08 NOTE — Telephone Encounter (Signed)
bs highs have been happening consistently now thru the days  Could the highs be because of the cough drops and cough syrup

## 2017-01-08 NOTE — Telephone Encounter (Signed)
I contacted the patient's wife. She stated the patient does not check his blood sugar consistently. Patent's morning blood sugars have been ranging between 138-178 and afternoon readings have not been below 200. Patient is currently taking 100 units of Novolin N and 25 units of Novolin 70/30. She did confirm the patient has not been receiving any prednisone at this time.

## 2017-01-08 NOTE — Telephone Encounter (Signed)
Confirm that he is taking 100 units NPH in the morning and the 70/30 at suppertime only  He can take an additional dose of 20 units of the 70/30 insulin in the morning along with his NPH in the morning

## 2017-01-08 NOTE — Telephone Encounter (Signed)
I need to know exactly what his blood sugars are various times of the day for the last 3 days and insulin dosing he is taking.  He can try using sugar-free cough syrup.  Has he had any prednisone?

## 2017-01-08 NOTE — Telephone Encounter (Signed)
See message and please advise during Dr. Ellison's absence. Thanks!  

## 2017-01-09 ENCOUNTER — Ambulatory Visit (HOSPITAL_COMMUNITY)
Admission: RE | Admit: 2017-01-09 | Discharge: 2017-01-09 | Disposition: A | Discharge status: Home / Self Care | Payer: Medicare Other | Source: Ambulatory Visit | Attending: Internal Medicine | Admitting: Internal Medicine

## 2017-01-09 ENCOUNTER — Ambulatory Visit: Payer: Medicare Other | Admitting: Endocrinology

## 2017-01-09 DIAGNOSIS — I5022 Chronic systolic (congestive) heart failure: Secondary | ICD-10-CM | POA: Diagnosis not present

## 2017-01-09 LAB — BASIC METABOLIC PANEL
ANION GAP: 9 (ref 5–15)
BUN: 15 mg/dL (ref 6–20)
CALCIUM: 9.1 mg/dL (ref 8.9–10.3)
CO2: 25 mmol/L (ref 22–32)
Chloride: 102 mmol/L (ref 101–111)
Creatinine, Ser: 1.42 mg/dL — ABNORMAL HIGH (ref 0.61–1.24)
GFR, EST AFRICAN AMERICAN: 55 mL/min — AB (ref >60)
GFR, EST NON AFRICAN AMERICAN: 47 mL/min — AB (ref >60)
GLUCOSE: 226 mg/dL — AB (ref 65–99)
POTASSIUM: 4.6 mmol/L (ref 3.5–5.1)
SODIUM: 136 mmol/L (ref 135–145)

## 2017-01-09 NOTE — Telephone Encounter (Signed)
I contacted the patient and advised of message via voicemail. Requested a call back to further discuss.

## 2017-01-15 DIAGNOSIS — E785 Hyperlipidemia, unspecified: Secondary | ICD-10-CM | POA: Diagnosis not present

## 2017-01-15 DIAGNOSIS — R531 Weakness: Secondary | ICD-10-CM | POA: Diagnosis not present

## 2017-01-19 ENCOUNTER — Ambulatory Visit (INDEPENDENT_AMBULATORY_CARE_PROVIDER_SITE_OTHER): Payer: Medicare Other

## 2017-01-19 DIAGNOSIS — Z95810 Presence of automatic (implantable) cardiac defibrillator: Secondary | ICD-10-CM

## 2017-01-19 DIAGNOSIS — I5022 Chronic systolic (congestive) heart failure: Secondary | ICD-10-CM | POA: Diagnosis not present

## 2017-01-19 NOTE — Progress Notes (Signed)
EPIC Encounter for ICM Monitoring  Patient Name: Justin Hill is a 76 y.o. male Date: 01/19/2017 Primary Care Physican: Cyndy Freeze, MD Primary Cardiologist:[place] Electrophysiologist: Allred Dry Weight:235lb  Bi-V Pacing: 97.1%    Clinical Status (16-Dec-2016 to 19-Jan-2017) Treated VT/VF 0 episodes  AT/AF 8 episodes  Time in AT/AF 0.1 hr/day (0.5%)                                Spoke with wife.  Heart Failure questions reviewed, pt asymptomatic now but had ankle swelling in last week due to increased salt intake.  She said he is feeling fine now.   Thoracic impedance at baseline today but did have some fluid in last week.  Labs: 01/09/2017 Creatinine 1.42, BUN 15, Potassium 4.6, Sodium 136, EGFR 47-55 12/22/2016 Creatinine 1.56, BUN 22, Potassium 5.0, Sodium 138, EGFR 43-50 12/02/2016 Creatinine 1.20, BUN 14, Potassium 4.9, Sodium 138, EGFR >60 10/03/2016 Creatinine 1.27, BUN 16, Potassium 4.9, Sodium 135, EGFR 54->60  08/01/2016 Creatinine 1.21, BUN 16, Potassium 5.0, Sodium 138, EGFR 59-68  07/23/2016 Creatinine 1.52, BUN 19, Potassium 4.5, Sodium 136  05/29/2016 Creatinine 1.26, BUN 15, Potassium 4.6, Sodium 141  04/30/2016 Creatinine 1.17, BUN 9, Potassium 4.9, Sodium 138  12/28/2015 Creatinine 1.35, BUN 13, Potassium 4.9, Sodium 142  11/30/2016Creatinine 1.37, BUN 20, Potassium 4.3, Sodium 139   Prescribed and confirmed dosage: Torsemide 10 mg every other day  Recommendations: No changes. Reminded to limit dietary salt intake to 2000 mg/day and fluid intake to < 2 liters/day. Encouraged to call for fluid symptoms.  Follow-up plan: ICM clinic phone appointment on 02/19/2017.  Copy of ICM check sent to device physician.   3 month ICM trend: 01/19/2017      1 Year ICM trend:      Rosalene Billings, RN 01/19/2017 1:03 PM

## 2017-01-22 DIAGNOSIS — L299 Pruritus, unspecified: Secondary | ICD-10-CM | POA: Diagnosis not present

## 2017-01-22 DIAGNOSIS — L853 Xerosis cutis: Secondary | ICD-10-CM | POA: Diagnosis not present

## 2017-01-22 DIAGNOSIS — L300 Nummular dermatitis: Secondary | ICD-10-CM | POA: Diagnosis not present

## 2017-01-27 ENCOUNTER — Encounter: Payer: Self-pay | Admitting: Endocrinology

## 2017-01-27 ENCOUNTER — Ambulatory Visit (INDEPENDENT_AMBULATORY_CARE_PROVIDER_SITE_OTHER): Payer: Medicare Other | Admitting: Endocrinology

## 2017-01-27 VITALS — BP 126/64 | HR 83 | Ht 71.0 in | Wt 245.0 lb

## 2017-01-27 DIAGNOSIS — N183 Chronic kidney disease, stage 3 unspecified: Secondary | ICD-10-CM

## 2017-01-27 DIAGNOSIS — Z794 Long term (current) use of insulin: Secondary | ICD-10-CM

## 2017-01-27 DIAGNOSIS — E1122 Type 2 diabetes mellitus with diabetic chronic kidney disease: Secondary | ICD-10-CM

## 2017-01-27 LAB — POCT GLYCOSYLATED HEMOGLOBIN (HGB A1C): HEMOGLOBIN A1C: 8.4

## 2017-01-27 MED ORDER — INSULIN NPH ISOPHANE & REGULAR (70-30) 100 UNIT/ML ~~LOC~~ SUSP: 30.0000 [IU] | Freq: Every day | SUBCUTANEOUS | 11 refills | Status: DC

## 2017-01-27 MED ORDER — INSULIN NPH (HUMAN) (ISOPHANE) 100 UNIT/ML ~~LOC~~ SUSP: 90.0000 [IU] | SUBCUTANEOUS | 2 refills | Status: DC

## 2017-01-27 NOTE — Patient Instructions (Addendum)
Please continue the same morning insulin: 90 units of NPH (50 if you are going to be active).   With supper, take just 30 units of "70/30."  On this type of insulin schedule, you should eat meals on a regular schedule.  If a meal is missed or significantly delayed, your blood sugar could go low.   The effects of the steroid shot will be gone in a few days.  Until then, take 10 extra units for any blood sugar over 200.  check your blood sugar twice a day.  vary the time of day when you check, between before the 3 meals, and at bedtime.  also check if you have symptoms of your blood sugar being too high or too low.  please keep a record of the readings and bring it to your next appointment here.  please call us sooner if your blood sugar goes below 70, or if you have a lot of readings over 200.   If you have any blood sugar under 80, please write on the paper why you think that was.   Please come back for a follow-up appointment in 2 months.

## 2017-01-27 NOTE — Progress Notes (Signed)
Subjective:    Patient ID: Justin Hill, male    DOB: 09-14-1941, 76 y.o.   MRN: [id number]  HPI Pt returns for f/u of diabetes mellitus: DM type: Insulin-requiring type 2. Dx'ed: 3785 Complications: CHF, toe ulcer, partial toe amputation, polyneuropathy, and renal insufficiency.  Therapy: insulin since 2012.  DKA: never.   Severe hypoglycemia: never.    Pancreatitis: never.   Other: he takes human insulin for cost reasons; in October of 2014, he was changed from multiple daily injections to BID, due to persistently high a1c.  The pattern of cbg's indicates he needs NPH in AM and 70/30 with dinner.  Interval history: pt states he feels well in general. He had a steroid injection last week.  This increased cbg's to 300's.  However, even prior to the steroid, cbg's were in the high-200's in the afternoon, and at hs. Past Medical History:  Diagnosis Date  . Cardiomyopathy   . CHF (congestive heart failure) ([place])   . Diabetes mellitus   . Diverticulosis   . Hypercholesterolemia   . Hypertension   . Paroxysmal atrial fibrillation (HCC)   . Personal history of kidney stones     Past Surgical History:  Procedure Laterality Date  . ATRIAL FLUTTER ABLATION N/A 01/19/2015   Procedure: ATRIAL FLUTTER ABLATION;  Surgeon: [name], MD;  Location: [facility] CATH LAB;  Service: Cardiovascular;  Laterality: N/A;  . CARDIAC CATHETERIZATION N/A 05/02/2016   Procedure: Right/Left Heart Cath and Coronary Angiography;  Surgeon: Larey Dresser, MD;  Location: [facility] CV LAB;  Service: Cardiovascular;  Laterality: N/A;  . COLONOSCOPY N/A 12/29/2013   Procedure: COLONOSCOPY;  Surgeon: Lafayette Dragon, MD;  Location: [facility] ENDOSCOPY;  Service: Endoscopy;  Laterality: N/A;  . EP IMPLANTABLE DEVICE N/A 06/05/2016   MDT Hillery Aldo XT CRTD implanted by Dr Rayann Heman   . [place]   left. Muscle reattachment  . TOE SURGERY Right 12/2012   2nd toe  . TONSILLECTOMY      Social History   Social  History  . Marital status: Married    Spouse name: Rosemarie Beath  . Number of children: 1  . Years of education: 58   Occupational History  . retired    Social History Main Topics  . Smoking status: Former Smoker    Types: Cigarettes  . Smokeless tobacco: Never Used  . Alcohol use No  . Drug use: No  . Sexual activity: Not on file   Other Topics Concern  . Not on file   Social History Narrative   Regular exercise-no          Current Outpatient Prescriptions on File Prior to Visit  Medication Sig Dispense Refill  . Ascorbic Acid (VITAMIN C) 500 MG CAPS Take 1 capsule by mouth 2 (two) times daily.     . benzonatate (TESSALON) 200 MG capsule Take 200 mg by mouth as directed.     . carvedilol (COREG) 25 MG tablet TAKE ONE TABLET BY MOUTH TWICE DAILY WITH MEALS 180 tablet 3  . CINNAMON PO Take 1 tablet by mouth every evening. Reported on 11/13/2015    . digoxin (LANOXIN) 0.125 MG tablet Take 0.125 mg by mouth daily.     . diphenhydramine-acetaminophen (TYLENOL PM) 25-500 MG TABS Take 2 tablets by mouth at bedtime as needed (sleep).     . folic acid (FOLVITE) 1 MG tablet Take 1 mg by mouth daily.    . Multiple Vitamin (MULTIVITAMIN) tablet Take 1  tablet by mouth daily.      . sacubitril-valsartan (ENTRESTO) 97-103 MG Take 1 tablet by mouth 2 (two) times daily. 60 tablet 3  . simvastatin (ZOCOR) 40 MG tablet Take 40 mg by mouth at bedtime.      . tamsulosin (FLOMAX) 0.4 MG CAPS capsule Take 0.4 mg by mouth daily.     Marland Kitchen torsemide (DEMADEX) 10 MG tablet Take 1 tablet (10 mg total) by mouth every other day. 30 tablet 3  . XARELTO 20 MG TABS tablet TAKE ONE TABLET BY MOUTH ONCE DAILY WITH  SUPPER 30 tablet 3  . methotrexate 2.5 MG tablet Take 2.5 mg by mouth See admin instructions. Take 5 mg every Wednesday morning ONLY     No current facility-administered medications on file prior to visit.     Allergies  Allergen Reactions  . Aspirin Other (See Comments)    Burps, burning, stomach  pains, etc  . Lisinopril Cough    Severe coughing  . Penicillins Other (See Comments)    syncope    Family History  Problem Relation Age of Onset  . Heart failure Maternal Grandmother   . Diabetes type II Sister 57  . Hyperlipidemia Other     Parent  . Stroke Other     Parent  . Hypertension Other     Parent  . Diabetes Other     Parent  . Colon cancer Neg Hx   . Heart attack Neg Hx     BP 126/64   Pulse 83   Ht 5\' 11"  (1.803 m)   Wt 245 lb (111.1 kg)   SpO2 96%   BMI 34.17 kg/m    Review of Systems He denies hypoglycemia    Objective:   Physical Exam VITAL SIGNS:  See vs page GENERAL: no distress EXTEMITIES: no deformity, except for hammer toes on the left foot. feet are of normal temp.  fistal right 2nd toe is surgically absent.   SKIN: no ulcer.   CV: 1+ bilat leg edema. There is bilateral onychomycosis, rust-colored discoloration, and varicosities.   PULSES: dorsalis pedis intact bilat.  NEURO: sensation is intact to touch on the feet, but decreased from normal.   Lab Results  Component Value Date   HGBA1C 8.4 01/27/2017      Assessment & Plan:  Insulin-requiring type 2 DM, with renal insuff: worse Back pain: steroid rx is affecting glycemic control.  Patient is advised the following: Patient Instructions  Please continue the same morning insulin: 90 units of NPH (50 if you are going to be active).   With supper, take just 30 units of "70/30."  On this type of insulin schedule, you should eat meals on a regular schedule.  If a meal is missed or significantly delayed, your blood sugar could go low.   The effects of the steroid shot will be gone in a few days.  Until then, take 10 extra units for any blood sugar over 200.  check your blood sugar twice a day.  vary the time of day when you check, between before the 3 meals, and at bedtime.  also check if you have symptoms of your blood sugar being too high or too low.  please keep a record of the  readings and bring it to your next appointment here.  please call us sooner if your blood sugar goes below 70, or if you have a lot of readings over 200.   If you have any blood sugar under 80,  please write on the paper why you think that was.   Please come back for a follow-up appointment in 2 months.

## 2017-02-02 ENCOUNTER — Ambulatory Visit: Payer: Medicare Other | Admitting: Podiatry

## 2017-02-03 ENCOUNTER — Other Ambulatory Visit: Payer: Self-pay | Admitting: Cardiology

## 2017-02-03 DIAGNOSIS — I5022 Chronic systolic (congestive) heart failure: Secondary | ICD-10-CM | POA: Diagnosis not present

## 2017-02-03 DIAGNOSIS — Z794 Long term (current) use of insulin: Secondary | ICD-10-CM | POA: Diagnosis not present

## 2017-02-03 DIAGNOSIS — I428 Other cardiomyopathies: Secondary | ICD-10-CM | POA: Diagnosis not present

## 2017-02-03 DIAGNOSIS — N183 Chronic kidney disease, stage 3 (moderate): Secondary | ICD-10-CM | POA: Diagnosis not present

## 2017-02-03 DIAGNOSIS — N401 Enlarged prostate with lower urinary tract symptoms: Secondary | ICD-10-CM | POA: Diagnosis not present

## 2017-02-03 DIAGNOSIS — E782 Mixed hyperlipidemia: Secondary | ICD-10-CM | POA: Diagnosis not present

## 2017-02-03 DIAGNOSIS — E1122 Type 2 diabetes mellitus with diabetic chronic kidney disease: Secondary | ICD-10-CM | POA: Diagnosis not present

## 2017-02-04 ENCOUNTER — Encounter (HOSPITAL_COMMUNITY): Payer: Self-pay | Admitting: General Practice

## 2017-02-04 ENCOUNTER — Observation Stay (HOSPITAL_COMMUNITY)
Admission: AD | Admit: 2017-02-04 | Discharge: 2017-02-05 | Disposition: A | Discharge status: Home / Self Care | Payer: Medicare Other | Source: Other Acute Inpatient Hospital | Attending: Family Medicine | Admitting: Family Medicine

## 2017-02-04 DIAGNOSIS — Z794 Long term (current) use of insulin: Secondary | ICD-10-CM

## 2017-02-04 DIAGNOSIS — R739 Hyperglycemia, unspecified: Secondary | ICD-10-CM | POA: Diagnosis not present

## 2017-02-04 DIAGNOSIS — N189 Chronic kidney disease, unspecified: Secondary | ICD-10-CM | POA: Insufficient documentation

## 2017-02-04 DIAGNOSIS — I480 Paroxysmal atrial fibrillation: Secondary | ICD-10-CM | POA: Diagnosis not present

## 2017-02-04 DIAGNOSIS — Z87442 Personal history of urinary calculi: Secondary | ICD-10-CM | POA: Diagnosis not present

## 2017-02-04 DIAGNOSIS — E1121 Type 2 diabetes mellitus with diabetic nephropathy: Secondary | ICD-10-CM | POA: Diagnosis not present

## 2017-02-04 DIAGNOSIS — I130 Hypertensive heart and chronic kidney disease with heart failure and stage 1 through stage 4 chronic kidney disease, or unspecified chronic kidney disease: Secondary | ICD-10-CM | POA: Diagnosis not present

## 2017-02-04 DIAGNOSIS — G9340 Encephalopathy, unspecified: Secondary | ICD-10-CM | POA: Diagnosis not present

## 2017-02-04 DIAGNOSIS — E7800 Pure hypercholesterolemia, unspecified: Secondary | ICD-10-CM | POA: Diagnosis not present

## 2017-02-04 DIAGNOSIS — Z7901 Long term (current) use of anticoagulants: Secondary | ICD-10-CM | POA: Diagnosis not present

## 2017-02-04 DIAGNOSIS — E1142 Type 2 diabetes mellitus with diabetic polyneuropathy: Secondary | ICD-10-CM | POA: Insufficient documentation

## 2017-02-04 DIAGNOSIS — G919 Hydrocephalus, unspecified: Secondary | ICD-10-CM

## 2017-02-04 DIAGNOSIS — I959 Hypotension, unspecified: Secondary | ICD-10-CM | POA: Insufficient documentation

## 2017-02-04 DIAGNOSIS — Z87891 Personal history of nicotine dependence: Secondary | ICD-10-CM | POA: Diagnosis not present

## 2017-02-04 DIAGNOSIS — E1165 Type 2 diabetes mellitus with hyperglycemia: Secondary | ICD-10-CM | POA: Insufficient documentation

## 2017-02-04 DIAGNOSIS — R079 Chest pain, unspecified: Secondary | ICD-10-CM | POA: Diagnosis not present

## 2017-02-04 DIAGNOSIS — E860 Dehydration: Secondary | ICD-10-CM | POA: Insufficient documentation

## 2017-02-04 DIAGNOSIS — E1122 Type 2 diabetes mellitus with diabetic chronic kidney disease: Secondary | ICD-10-CM | POA: Diagnosis not present

## 2017-02-04 DIAGNOSIS — I129 Hypertensive chronic kidney disease with stage 1 through stage 4 chronic kidney disease, or unspecified chronic kidney disease: Secondary | ICD-10-CM | POA: Diagnosis not present

## 2017-02-04 DIAGNOSIS — Z880 Allergy status to penicillin: Secondary | ICD-10-CM | POA: Insufficient documentation

## 2017-02-04 DIAGNOSIS — I5022 Chronic systolic (congestive) heart failure: Secondary | ICD-10-CM | POA: Insufficient documentation

## 2017-02-04 DIAGNOSIS — K529 Noninfective gastroenteritis and colitis, unspecified: Secondary | ICD-10-CM | POA: Insufficient documentation

## 2017-02-04 DIAGNOSIS — I2510 Atherosclerotic heart disease of native coronary artery without angina pectoris: Secondary | ICD-10-CM | POA: Insufficient documentation

## 2017-02-04 DIAGNOSIS — I672 Cerebral atherosclerosis: Secondary | ICD-10-CM | POA: Diagnosis not present

## 2017-02-04 DIAGNOSIS — G910 Communicating hydrocephalus: Secondary | ICD-10-CM | POA: Diagnosis not present

## 2017-02-04 DIAGNOSIS — R5383 Other fatigue: Secondary | ICD-10-CM | POA: Diagnosis present

## 2017-02-04 DIAGNOSIS — N183 Chronic kidney disease, stage 3 (moderate): Secondary | ICD-10-CM | POA: Diagnosis not present

## 2017-02-04 LAB — GLUCOSE, CAPILLARY: GLUCOSE-CAPILLARY: 185 mg/dL — AB (ref 65–99)

## 2017-02-04 MED ORDER — INSULIN ASPART 100 UNIT/ML ~~LOC~~ SOLN
0.0000 [IU] | SUBCUTANEOUS | Status: DC
  Administered 2017-02-05: 2 [IU] via SUBCUTANEOUS

## 2017-02-04 MED ORDER — ACETAMINOPHEN 325 MG PO TABS: 650.0000 mg | ORAL_TABLET | ORAL | Status: DC | PRN

## 2017-02-04 MED ORDER — SODIUM CHLORIDE 0.9 % IV SOLN
INTRAVENOUS | Status: DC
  Administered 2017-02-04: via INTRAVENOUS

## 2017-02-04 MED ORDER — ACETAMINOPHEN 160 MG/5ML PO SOLN: 650.0000 mg | ORAL | Status: DC | PRN

## 2017-02-04 MED ORDER — ACETAMINOPHEN 650 MG RE SUPP: 650.0000 mg | RECTAL | Status: DC | PRN

## 2017-02-04 NOTE — H&P (Signed)
Justin Hill GQQ:[id number] DOB: [birth year] DOA: 02/04/2017     PCP: Cyndy Freeze, MD   Outpatient Specialists: Rocco Pauls, Cardiology Zada Finders Patient coming from:   home Lives With family   Chief Complaint: Confusion, lethargy  HPI: Justin Hill is a 76 y.o. male with medical history significant of DM2. Chronic systolic CHF nonischemic cardiomyopathy partial toe amputation,  polyneuropathy, and CKD, hypercholesteremia paroxysmal atrial fibrillation on anticoagulation, LBBB BiVpacemaker insertion  Presented with  Some lethargy and not feeling well for the past 24 h. Wife took him to PCP to discuss slow progressive memory loss. They were given prescription for Nemenda. He have not been feeling well slept all day and had some dry heaves, no sick contacts, reports diarrhea started only today, no chest pain, no worsening cough no fever but had some chills. Wife took him to ER at [facility] and he was still sleepy. Decreased appetite. He was hypotensive in ER down to 90's He was given IV fluids. After he was given IV fluids he felt much better. She reports he only had shuffling gate for a bit today.  He is currently back to baseline and very alert  And oriented.   Deneis urinary incontinence.   Regarding pertinent Chronic problems: Echogram in 2016 showed EF of 25-30 percent, last left heart catheterization 2008 showed mild nonobstructive CAD has history of a flatter and A. fib initially required cardioversion but then recurred had undergone ablation in 2016 he is on the Xarelto and has been in sinus rhythm mainly but with occasional A. fib a flatter,  Regarding atrial fibrillation his currently on digoxinstatus post Medtronic CRT-D device by pacing improvement and EF up to 40-45 percent.   IN ER:  Temp (24hrs), Avg:97.1 F (36.2 C), Min:97.1 F (36.2 C), Max:97.1 F (36.2 C)    Afebrile pulse at maximal 108 respirations 18 blood pressure 120/53 oxygen saturation 92% WBC  10.5 hemoglobin 13.3 platelets 100 INR 1.1 sodium 139 potassium 4.6 creatinine 1.7 LFTs within normal limits troponin 0.05 cutoff is less than 0.04 TSH 1.29 ABG 7.44/34/68  ammonia less than 9 Blood glucose 187 Influenza negative Following Medications were ordered in ER: UA no evidence of infection Digoxin <0.4 ER provider discussed case with:  [facility] neurology stated that communicating hydrocephalus could be explanation for patient's altered mental status recommended UA and if negative obtain LP patient onset around October or need FFP transfusion prior to LP and LP by fluoroscopy hospitalist at [facility] was consulted but states that if the patient needed a shunt that they won't be able to do that at [facility] requested transfer to [facility] was called and spoke to Dr. Wendee Beavers at [facility] reading normal pressure hydrocephalus feels that patient does not need emergency LP recommended evaluation for other etiologies such as infection she accepted in transfer by Dr. Allyson Sabal  Chest x-ray showed no acute cardiopulmonary disease CT head showed diffuse prominence of ventricular system could be due to atrophy but worrisome for communicating hydrocephalus  Hospitalist was called for admission for altered mental status with CT scan showing normal pressure hydrocephalus  Review of Systems:    Pertinent positives include: chills,  fatigue,   Constitutional:  No weight loss, night sweats, Fevers,weight loss  HEENT:  No headaches, Difficulty swallowing,Tooth/dental problems,Sore throat,  No sneezing, itching, ear ache, nasal congestion, post nasal drip,  Cardio-vascular:  No chest pain, Orthopnea, PND, anasarca, dizziness, palpitations.no Bilateral lower extremity swelling  GI:  No heartburn, indigestion, abdominal pain, nausea,  vomiting, diarrhea, change in bowel habits, loss of appetite, melena, blood in stool, hematemesis Resp:  no shortness of breath at rest. No dyspnea on exertion, No excess  mucus, no productive cough, No non-productive cough, No coughing up of blood.No change in color of mucus.No wheezing. Skin:  no rash or lesions. No jaundice GU:  no dysuria, change in color of urine, no urgency or frequency. No straining to urinate.  No flank pain.  Musculoskeletal:  No joint pain or no joint swelling. No decreased range of motion. No back pain.  Psych:  No change in mood or affect. No depression or anxiety. No memory loss.  Neuro: no localizing neurological complaints, no tingling, no weakness, no double vision, no gait abnormality, no slurred speech, no confusion  As per HPI otherwise 10 point review of systems negative.   Past Medical History: Past Medical History:  Diagnosis Date  . Cardiomyopathy   . CHF (congestive heart failure) ([place])   . Diabetes mellitus   . Diverticulosis   . Hypercholesterolemia   . Hypertension   . Paroxysmal atrial fibrillation (HCC)   . Personal history of kidney stones    Past Surgical History:  Procedure Laterality Date  . ATRIAL FLUTTER ABLATION N/A 01/19/2015   Procedure: ATRIAL FLUTTER ABLATION;  Surgeon: [name], MD;  Location: [facility] CATH LAB;  Service: Cardiovascular;  Laterality: N/A;  . CARDIAC CATHETERIZATION N/A 05/02/2016   Procedure: Right/Left Heart Cath and Coronary Angiography;  Surgeon: Larey Dresser, MD;  Location: [facility] CV LAB;  Service: Cardiovascular;  Laterality: N/A;  . COLONOSCOPY N/A 12/29/2013   Procedure: COLONOSCOPY;  Surgeon: Lafayette Dragon, MD;  Location: [facility] ENDOSCOPY;  Service: Endoscopy;  Laterality: N/A;  . EP IMPLANTABLE DEVICE N/A 06/05/2016   MDT Hillery Aldo XT CRTD implanted by Dr Rayann Heman   . [place]   left. Muscle reattachment  . TOE SURGERY Right 12/2012   2nd toe  . TONSILLECTOMY       Social History:  Ambulatory   independently      reports that he has quit smoking. His smoking use included Cigarettes. He has never used smokeless tobacco. He reports that he does not  drink alcohol or use drugs.  Allergies:   Allergies  Allergen Reactions  . Aspirin Other (See Comments)    Burps, burning, stomach pains, etc  . Lisinopril Cough    Severe coughing  . Penicillins Other (See Comments)    syncope       Family History:   Family History  Problem Relation Age of Onset  . Heart failure Maternal Grandmother   . Diabetes type II Sister 81  . Hyperlipidemia Other     Parent  . Stroke Other     Parent  . Hypertension Other     Parent  . Diabetes Other     Parent  . Colon cancer Neg Hx   . Heart attack Neg Hx     Medications: Prior to Admission medications   Medication Sig Start Date End Date Taking? Authorizing Provider  Ascorbic Acid (VITAMIN C) 500 MG CAPS Take 1 capsule by mouth 2 (two) times daily.     Historical Provider, MD  azaTHIOprine (IMURAN) 50 MG tablet  01/22/17   Historical Provider, MD  benzonatate (TESSALON) 200 MG capsule Take 200 mg by mouth as directed.     Historical Provider, MD  Bilberry, Vaccinium myrtillus, (BILBERRY PO) Take by mouth.    Historical Provider, MD  carvedilol (COREG)  25 MG tablet TAKE ONE TABLET BY MOUTH TWICE DAILY WITH MEALS 06/30/16   Larey Dresser, MD  CINNAMON PO Take 1 tablet by mouth every evening. Reported on 11/13/2015    Historical Provider, MD  digoxin (LANOXIN) 0.125 MG tablet TAKE ONE TABLET BY MOUTH ONCE DAILY 02/04/17   Larey Dresser, MD  diphenhydramine-acetaminophen (TYLENOL PM) 25-500 MG TABS Take 2 tablets by mouth at bedtime as needed (sleep).     Historical Provider, MD  folic acid (FOLVITE) 1 MG tablet Take 1 mg by mouth daily.    Historical Provider, MD  insulin NPH Human (NOVOLIN N) 100 UNIT/ML injection Inject 0.9 mLs (90 Units total) into the skin every morning. 01/27/17   Renato Shin, MD  insulin NPH-regular Human (NOVOLIN 70/30) (70-30) 100 UNIT/ML injection Inject 30 Units into the skin daily with supper. 01/27/17   Renato Shin, MD  methotrexate 2.5 MG tablet Take 2.5 mg by mouth  See admin instructions. Take 5 mg every Wednesday morning ONLY    Historical Provider, MD  Multiple Vitamin (MULTIVITAMIN) tablet Take 1 tablet by mouth daily.      Historical Provider, MD  sacubitril-valsartan (ENTRESTO) 97-103 MG Take 1 tablet by mouth 2 (two) times daily. 12/02/16   Larey Dresser, MD  simvastatin (ZOCOR) 40 MG tablet Take 40 mg by mouth at bedtime.      Historical Provider, MD  tamsulosin (FLOMAX) 0.4 MG CAPS capsule Take 0.4 mg by mouth daily.     Historical Provider, MD  torsemide (DEMADEX) 10 MG tablet Take 1 tablet (10 mg total) by mouth every other day. 07/24/16   Amber Sena Slate, NP  XARELTO 20 MG TABS tablet TAKE ONE TABLET BY MOUTH ONCE DAILY WITH  SUPPER 02/04/17   Larey Dresser, MD    Physical Exam: Patient Vitals for the past 24 hrs:  BP Temp Temp src Pulse SpO2  02/04/17 2254 114/84 97.1 F (36.2 C) Oral 88 95 %    1. General:  in No Acute distress 2. Psychological: Alert and   Oriented 3. Head/ENT:     Dry Mucous Membranes                          Head Non traumatic, neck supple                           Poor Dentition 4. SKIN:   decreased Skin turgor,  Skin clean Dry and intact no rash 5. Heart: Regular rate and rhythm   Murmur, Rub or gallop 6. Lungs:   no wheezes or crackles   7. Abdomen: Soft,   non-tender, Non distended 8. Lower extremities: no clubbing, cyanosis, or edema 9. Neurologically  strength 5 out of 5 in all 4 extremities cranial nerves II through XII intact 10. MSK: Normal range of motion   body mass index is unknown because there is no height or weight on file.  Labs on Admission:   Labs on Admission: I have personally reviewed following labs and imaging studies  CBC: No results for input(s): WBC, NEUTROABS, HGB, HCT, MCV, PLT in the last 168 hours. Basic Metabolic Panel: No results for input(s): NA, K, CL, CO2, GLUCOSE, BUN, CREATININE, CALCIUM, MG, PHOS in the last 168 hours. GFR: CrCl cannot be calculated (Patient's most  recent lab result is older than the maximum 21 days allowed.). Liver Function Tests: No results for input(s): AST, ALT,  ALKPHOS, BILITOT, PROT, ALBUMIN in the last 168 hours. No results for input(s): LIPASE, AMYLASE in the last 168 hours. No results for input(s): AMMONIA in the last 168 hours. Coagulation Profile: No results for input(s): INR, PROTIME in the last 168 hours. Cardiac Enzymes: No results for input(s): CKTOTAL, CKMB, CKMBINDEX, TROPONINI in the last 168 hours. BNP (last 3 results) No results for input(s): PROBNP in the last 8760 hours. HbA1C: No results for input(s): HGBA1C in the last 72 hours. CBG: No results for input(s): GLUCAP in the last 168 hours. Lipid Profile: No results for input(s): CHOL, HDL, LDLCALC, TRIG, CHOLHDL, LDLDIRECT in the last 72 hours. Thyroid Function Tests: No results for input(s): TSH, T4TOTAL, FREET4, T3FREE, THYROIDAB in the last 72 hours. Anemia Panel: No results for input(s): VITAMINB12, FOLATE, FERRITIN, TIBC, IRON, RETICCTPCT in the last 72 hours. Urine analysis: No results found for: COLORURINE, APPEARANCEUR, LABSPEC, PHURINE, GLUCOSEU, HGBUR, BILIRUBINUR, KETONESUR, PROTEINUR, UROBILINOGEN, NITRITE, LEUKOCYTESUR Sepsis Labs: @LABRCNTIP (procalcitonin:4,lacticidven:4) )No results found for this or any previous visit (from the past 240 hour(s)).     UA   no evidence of UTI     Lab Results  Component Value Date   HGBA1C 8.4 01/27/2017    CrCl cannot be calculated (Patient's most recent lab result is older than the maximum 21 days allowed.).  BNP (last 3 results) No results for input(s): PROBNP in the last 8760 hours.   ECG REPORT  Independently reviewed Rate:96  Rhythm: Normal sinus rhythm ST&T Change: Appears to be paced QTC 402  There were no vitals filed for this visit.   Cultures: No results found for: [place], [place], [place], REPTSTATUS   Radiological Exams on Admission: No results found.  Chart has been  reviewed    Assessment/Plan   76 y.o. male with medical history significant of DM2. Chronic systolic CHF nonischemic cardiomyopathy partial toe amputation,  polyneuropathy, and CKD, hypercholesteremia paroxysmal atrial fibrillation on anticoagulation, LBBB BiV pacemaker insertion admitted for acute encephalopathy in the setting of dehydration now resolved  Present on Admission: . Acute encephalopathy now resolved Most likely secondary to dehydration and gastrointestinal illness currently resolving . Lethargy resolved . Hyperglycemia continue home regimen of insulin and order sliding scale patient has recently received steroid injections by his PCP and believes that that is why his blood sugar has been elevated . Dehydration -  gentle IV hydration . Gastroenteritis - Gastric panel hold Imuran Dermatologic rash patient has been on immunosuppressives hold Imuran for today likely contributing to patient decompensating given immunosuppression History of systolic CHF hold torsemide for today History of hypertension hold home medications given soft blood pressures hold Entresto for tonight restart as able to tolerate Memory problems mild follow up with PCP Possible NPH will need outpatient work up in the future no idication for emergent LP , discussed with Neurology apprecciate their consult Other plan as per orders.  DVT prophylaxis: xarelto    Code Status:  FULL CODE as per patient    Family Communication:   Family   at  Bedside  plan of care was discussed with  Wife   Disposition Plan:     To home once workup is complete and patient is stable                            Consults called: Neurology  Admission status:    obs expect DC in AM   Level of care     tele  I have spent a total of 66 min on this admission   Elanda Garmany 02/05/2017, 1:04 AM    [facility]  Pager [phone]   after 2 AM please page floor coverage PA If 7AM-7PM, please contact the day  team taking care of the patient  [email or web address]  Password [email or web address]

## 2017-02-04 NOTE — Progress Notes (Signed)
   76 yo with history CKD, HTN, diabetes, chronic LBBB, and nonischemic cardiomyopathy presents for cardiology followup.  His cardiomyopathy has been known for years.  Most recent echo was in 6/16 showed stable EF 25-30% with septal-lateral dyssynchrony. He had a LHC in 2008 showing mild nonobstructive CAD.    patient presented to [facility] ER with altered mental status, increased somnolence, per EDP physician at [facility], patient's CT of the head showed communicating hydrocephalus, concern for increased intracranial pressure. EDP would like to transfer the patient for possible lumbar puncture to rule out hydrocephalus. Vitals stable, no signs of infection, chest x-ray, UA, white count negative TSH within normal limits, troponin negative, Patient was discussed with Dr. Wendee Beavers, on call for neurology, was accepted to telemetry for evaluation of his neurological symptoms. Low concern for stroke at this time

## 2017-02-04 NOTE — Progress Notes (Signed)
  Pt admitted to the unit. Pt is stable, alert and oriented per baseline. Oriented to room, staff, and call bell. Educated to call for any assistance. Bed in lowest position, call bell within reach- will continue to monitor. 

## 2017-02-04 NOTE — Progress Notes (Addendum)
76 yo with history CKD, HTN, diabetes, chronic LBBB, and nonischemic cardiomyopathy presents for cardiology followup. His cardiomyopathy has been known for years. Most recent echo was in 6/16 showed stable EF 25-30% with septal-lateral dyssynchrony. He had a LHC in 2008 showing mild nonobstructive CAD.  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 EDP would like to transfer the patient for possible lumbar puncture to rule out hydrocephalus. Vitals stable, no signs of infection, chest x-ray, UA, white count negative TSH within normal limits, troponin negative, Patient was discussed with Dr. Wendee Beavers, on call for neurology, was accepted to telemetry for evaluation of his neurological symptoms. Low concern for stroke at this time

## 2017-02-05 ENCOUNTER — Telehealth: Payer: Self-pay

## 2017-02-05 ENCOUNTER — Other Ambulatory Visit: Payer: Self-pay

## 2017-02-05 DIAGNOSIS — N189 Chronic kidney disease, unspecified: Secondary | ICD-10-CM | POA: Diagnosis not present

## 2017-02-05 DIAGNOSIS — E1165 Type 2 diabetes mellitus with hyperglycemia: Secondary | ICD-10-CM | POA: Diagnosis not present

## 2017-02-05 DIAGNOSIS — I130 Hypertensive heart and chronic kidney disease with heart failure and stage 1 through stage 4 chronic kidney disease, or unspecified chronic kidney disease: Secondary | ICD-10-CM | POA: Diagnosis not present

## 2017-02-05 DIAGNOSIS — G9340 Encephalopathy, unspecified: Secondary | ICD-10-CM | POA: Diagnosis not present

## 2017-02-05 DIAGNOSIS — E860 Dehydration: Secondary | ICD-10-CM | POA: Diagnosis not present

## 2017-02-05 DIAGNOSIS — Z7901 Long term (current) use of anticoagulants: Secondary | ICD-10-CM | POA: Diagnosis not present

## 2017-02-05 DIAGNOSIS — E1122 Type 2 diabetes mellitus with diabetic chronic kidney disease: Secondary | ICD-10-CM | POA: Diagnosis not present

## 2017-02-05 DIAGNOSIS — K529 Noninfective gastroenteritis and colitis, unspecified: Secondary | ICD-10-CM | POA: Diagnosis present

## 2017-02-05 DIAGNOSIS — G919 Hydrocephalus, unspecified: Secondary | ICD-10-CM | POA: Diagnosis not present

## 2017-02-05 DIAGNOSIS — Z880 Allergy status to penicillin: Secondary | ICD-10-CM | POA: Diagnosis not present

## 2017-02-05 DIAGNOSIS — I480 Paroxysmal atrial fibrillation: Secondary | ICD-10-CM | POA: Diagnosis not present

## 2017-02-05 DIAGNOSIS — I5022 Chronic systolic (congestive) heart failure: Secondary | ICD-10-CM | POA: Diagnosis not present

## 2017-02-05 DIAGNOSIS — E1121 Type 2 diabetes mellitus with diabetic nephropathy: Secondary | ICD-10-CM

## 2017-02-05 DIAGNOSIS — R739 Hyperglycemia, unspecified: Secondary | ICD-10-CM | POA: Diagnosis present

## 2017-02-05 DIAGNOSIS — R5383 Other fatigue: Secondary | ICD-10-CM | POA: Diagnosis present

## 2017-02-05 DIAGNOSIS — E7800 Pure hypercholesterolemia, unspecified: Secondary | ICD-10-CM | POA: Diagnosis not present

## 2017-02-05 LAB — VITAMIN B12: VITAMIN B 12: 279 pg/mL (ref 180–914)

## 2017-02-05 LAB — GLUCOSE, CAPILLARY
GLUCOSE-CAPILLARY: 197 mg/dL — AB (ref 65–99)
GLUCOSE-CAPILLARY: 201 mg/dL — AB (ref 65–99)
GLUCOSE-CAPILLARY: 244 mg/dL — AB (ref 65–99)

## 2017-02-05 LAB — LIPID PANEL
CHOLESTEROL: 125 mg/dL (ref 0–200)
HDL: 40 mg/dL — ABNORMAL LOW (ref >40)
LDL Cholesterol: 55 mg/dL (ref 0–99)
Total CHOL/HDL Ratio: 3.1 RATIO
Triglycerides: 148 mg/dL (ref <150)
VLDL: 30 mg/dL (ref 0–40)

## 2017-02-05 LAB — TROPONIN I: Troponin I: 0.04 ng/mL (ref <0.03)

## 2017-02-05 LAB — TSH: TSH: 1.58 u[IU]/mL (ref 0.350–4.500)

## 2017-02-05 MED ORDER — DIGOXIN 125 MCG PO TABS: 125.0000 ug | ORAL_TABLET | Freq: Every day | ORAL | Status: DC

## 2017-02-05 MED ORDER — CARVEDILOL 3.125 MG PO TABS: 3.1250 mg | ORAL_TABLET | Freq: Two times a day (BID) | ORAL | 0 refills | Status: DC

## 2017-02-05 MED ORDER — DIGOXIN 125 MCG PO TABS
125.0000 ug | ORAL_TABLET | Freq: Every day | ORAL | Status: DC
  Administered 2017-02-05: 125 ug via ORAL
  Filled 2017-02-05 (×2): qty 1

## 2017-02-05 MED ORDER — RIVAROXABAN 20 MG PO TABS
20.0000 mg | ORAL_TABLET | Freq: Every day | ORAL | Status: DC
  Administered 2017-02-05: 20 mg via ORAL
  Filled 2017-02-05: qty 1

## 2017-02-05 MED ORDER — TORSEMIDE 10 MG PO TABS: 10.0000 mg | ORAL_TABLET | ORAL | 3 refills | Status: DC

## 2017-02-05 MED ORDER — INSULIN ASPART 100 UNIT/ML ~~LOC~~ SOLN
0.0000 [IU] | Freq: Three times a day (TID) | SUBCUTANEOUS | Status: DC
  Administered 2017-02-05: 2 [IU] via SUBCUTANEOUS

## 2017-02-05 MED ORDER — TAMSULOSIN HCL 0.4 MG PO CAPS
0.4000 mg | ORAL_CAPSULE | Freq: Every day | ORAL | Status: DC
  Administered 2017-02-05: 0.4 mg via ORAL
  Filled 2017-02-05: qty 1

## 2017-02-05 MED ORDER — SIMVASTATIN 40 MG PO TABS
40.0000 mg | ORAL_TABLET | Freq: Every day | ORAL | Status: DC
  Administered 2017-02-05: 40 mg via ORAL
  Filled 2017-02-05: qty 1

## 2017-02-05 MED ORDER — RIVAROXABAN 20 MG PO TABS: 20.0000 mg | ORAL_TABLET | Freq: Every day | ORAL | Status: DC

## 2017-02-05 MED ORDER — INSULIN NPH (HUMAN) (ISOPHANE) 100 UNIT/ML ~~LOC~~ SUSP
90.0000 [IU] | Freq: Every day | SUBCUTANEOUS | Status: DC
  Administered 2017-02-05: 90 [IU] via SUBCUTANEOUS
  Filled 2017-02-05: qty 10

## 2017-02-05 MED ORDER — SACUBITRIL-VALSARTAN 97-103 MG PO TABS: 1.0000 | ORAL_TABLET | Freq: Two times a day (BID) | ORAL | Status: DC

## 2017-02-05 NOTE — Care Management Obs Status (Signed)
[place] NOTIFICATION   Patient Details  Name: Justin Hill MRN: [id number] Date of Birth: [birth year]   Medicare Observation Status Notification Given:  Yes    Pollie Friar, RN 02/05/2017, 11:06 AM

## 2017-02-05 NOTE — Telephone Encounter (Signed)
Left message to call back  

## 2017-02-05 NOTE — Progress Notes (Signed)
CRITICAL VALUE ALERT  Critical value received:  Troponin 0.04  Date of notification:  3/15  Time of notification:  4:22am  Critical value read back:Yes.    Nurse who received alert:  Steffanie Dunn   MD notified (1st page):  NP Schorr  Time of first page:  4:24am  MD notified (2nd page):  Time of second page:  Responding MD:  Waiting response   Time MD responded:

## 2017-02-05 NOTE — Discharge Summary (Signed)
Physician Discharge Summary  Justin Hill CBJ:[id number] DOB: May 16, 1941 DOA: 02/04/2017  PCP: Cyndy Freeze, MD  Admit date: 02/04/2017 Discharge date: 02/05/2017  Time spent: > 35 minutes  Recommendations for Outpatient Follow-up:  1. Monitor blood pressures 2. Decreased B blocker 3. Held diuretic until 3/19 given recent reports of dehydration   Discharge Diagnoses:  Active Problems:   Hydrocephalus   Acute encephalopathy   Lethargy   Hyperglycemia   Dehydration   Gastroenteritis   Discharge Condition: stable  Diet recommendation: carb modified diet.  There were no vitals filed for this visit.  History of present illness:  76 y.o. male with medical history significant of DM2. Chronic systolic CHF nonischemic cardiomyopathy partial toe amputation,  polyneuropathy, and CKD, hypercholesteremia paroxysmal atrial fibrillation on anticoagulation, LBBB BiVpacemaker insertion  Presented with  Some lethargy and not feeling well for the past 24 h  Hospital Course:  Lethargy/dehydration -Was secondary to dehydration which improved with improvement in oral intake and IV fluids. Will hold Demadex until the Monday after discharge.  Systolic CHF -Given soft blood pressures will decrease beta blocker dose on discharge. Recommend patient follow-up with cardiologist  Enlarged ventricles reported on CT scan. - Patient does have not have any symptoms associated with hydrocephaly. Recommendations by neurology is for patient to follow-up with outpatient neurologist Procedures:   None  Consultations:   Neurology  Discharge Exam: Vitals:   02/05/17 0519 02/05/17 0826  BP: (!) 118/55   Pulse: 89 (!) 101  Resp: 20 20  Temp: 97.5 F (36.4 C) 98.2 F (36.8 C)    General: Patient in no acute distress, alert and awake Cardiovascular:  S1 and S2 present, no cyanosis Respiratory:  Clear to auscultation bilaterally, no wheezes  Discharge Instructions   Discharge  Instructions    Call MD for:  extreme fatigue    Complete by:  As directed    Call MD for:  temperature >100.4    Complete by:  As directed    Diet - low sodium heart healthy    Complete by:  As directed    Discharge instructions    Complete by:  As directed    Please monitor blood pressure ensure patient follows up with cardiologist as well as neurologist as outpatient. There is concern about enlarging ventricles and brain although patient has no symptoms associated with hydrocephaly   Increase activity slowly    Complete by:  As directed      Current Discharge Medication List    CONTINUE these medications which have CHANGED   Details  carvedilol (COREG) 3.125 MG tablet Take 1 tablet (3.125 mg total) by mouth 2 (two) times daily with a meal. Qty: 60 tablet, Refills: 0    torsemide (DEMADEX) 10 MG tablet Take 1 tablet (10 mg total) by mouth every other day. Qty: 30 tablet, Refills: 3      CONTINUE these medications which have NOT CHANGED   Details  Ascorbic Acid (VITAMIN C) 500 MG CAPS Take 1 capsule by mouth 2 (two) times daily.     azaTHIOprine (IMURAN) 50 MG tablet Take 50 mg by mouth daily.     Bilberry, Vaccinium myrtillus, (BILBERRY PO) Take by mouth.    insulin NPH-regular Human (NOVOLIN 70/30) (70-30) 100 UNIT/ML injection Inject 30 Units into the skin daily with supper. Qty: 20 mL, Refills: 11    Multiple Vitamin (MULTIVITAMIN) tablet Take 1 tablet by mouth daily.      sacubitril-valsartan (ENTRESTO) 97-103 MG Take 1 tablet by mouth 2 (  two) times daily. Qty: 60 tablet, Refills: 3    simvastatin (ZOCOR) 40 MG tablet Take 40 mg by mouth at bedtime.      tamsulosin (FLOMAX) 0.4 MG CAPS capsule Take 0.4 mg by mouth daily.     XARELTO 20 MG TABS tablet TAKE ONE TABLET BY MOUTH ONCE DAILY WITH  SUPPER Qty: 90 tablet, Refills: 3    digoxin (LANOXIN) 0.125 MG tablet TAKE ONE TABLET BY MOUTH ONCE DAILY Qty: 90 tablet, Refills: 3      STOP taking these medications      benzonatate (TESSALON) 200 MG capsule      CINNAMON PO      fexofenadine (ALLEGRA) 180 MG tablet      folic acid (FOLVITE) 1 MG tablet      insulin NPH Human (NOVOLIN N) 100 UNIT/ML injection      diphenhydramine-acetaminophen (TYLENOL PM) 25-500 MG TABS        Allergies  Allergen Reactions  . Aspirin Other (See Comments)    Burps, burning, stomach pains, etc  . Lisinopril Cough    Severe coughing  . Penicillins Other (See Comments)    syncope      The results of significant diagnostics from this hospitalization (including imaging, microbiology, ancillary and laboratory) are listed below for reference.    Significant Diagnostic Studies: No results found.  Microbiology: No results found for this or any previous visit (from the past 240 hour(s)).   Labs: Basic Metabolic Panel: No results for input(s): NA, K, CL, CO2, GLUCOSE, BUN, CREATININE, CALCIUM, MG, PHOS in the last 168 hours. Liver Function Tests: No results for input(s): AST, ALT, ALKPHOS, BILITOT, PROT, ALBUMIN in the last 168 hours. No results for input(s): LIPASE, AMYLASE in the last 168 hours. No results for input(s): AMMONIA in the last 168 hours. CBC: No results for input(s): WBC, NEUTROABS, HGB, HCT, MCV, PLT in the last 168 hours. Cardiac Enzymes:  Recent Labs Lab 02/05/17 0252  TROPONINI 0.04*   BNP: BNP (last 3 results)  Recent Labs  04/30/16 1000  BNP 45.6    ProBNP (last 3 results) No results for input(s): PROBNP in the last 8760 hours.  CBG:  Recent Labs Lab 02/04/17 2356 02/05/17 0601 02/05/17 0826 02/05/17 1138  GLUCAP 185* 197* 201* 244*    Signed:  Velvet Bathe MD.  [facility] 02/05/2017, 12:43 PM

## 2017-02-05 NOTE — Consult Note (Signed)
Neurology Consultation Reason for Consult: Abnormal CT scan Referring Physician: Roel Cluck, A  CC: Abnormal CT scan  History is obtained from: Patient  HPI: Justin Hill is a 76 y.o. male who presented to [facility] with lethargy. He was in his normal state of health until Monday when he went to bed. On Tuesday, his wife had difficulty arousing him and come to a regularly scheduled appointment with his PCP. He then left, but due to continued lethargy he was taken to the emergency room at [facility] where CT scan was performed which showed mildly enlarged ventricles.  Wife states that he got on "IV" and rapidly improved. He is now back to his baseline. He did develop some nausea Tuesday evening, and on Wednesday began having diarrhea.  Due to the CT scan which was read as " diffuse prominence of the ventricular system could be due to atrophy but is worrisome for communicating hydrocephalus" he was transferred to [facility] for further evaluation.  He has noticed no changes in his gait nor has he had any urinary incontinence. His wife has noticed some personality changes, meaning increased irritability now for a little bit more than a year. She has also noticed worsening memory over the past 6 months. She states that he will forget which doctor they're going to, and will forget about specific things that they are arguing about.    ROS: A 14 point ROS was performed and is negative except as noted in the HPI.   Past Medical History:  Diagnosis Date  . Cardiomyopathy   . CHF (congestive heart failure) ([place])   . Diabetes mellitus   . Diverticulosis   . Hypercholesterolemia   . Hypertension   . Paroxysmal atrial fibrillation (HCC)   . Personal history of kidney stones      Family History  Problem Relation Age of Onset  . Heart failure Maternal Grandmother   . Diabetes type II Sister 62  . Hyperlipidemia Other     Parent  . Stroke Other     Parent  . Hypertension  Other     Parent  . Diabetes Other     Parent  . Colon cancer Neg Hx   . Heart attack Neg Hx      Social History:  reports that he has quit smoking. His smoking use included Cigarettes. He has never used smokeless tobacco. He reports that he does not drink alcohol or use drugs.   Exam: Current vital signs: BP (!) 111/53 (BP Location: Right Arm)   Pulse 98   Temp 97.7 F (36.5 C) (Oral)   Resp 18   SpO2 97%  Vital signs in last 24 hours: Temp:  [97.1 F (36.2 C)-97.7 F (36.5 C)] 97.7 F (36.5 C) (03/15 0000) Pulse Rate:  [88-99] 98 (03/15 0128) Resp:  [18] 18 (03/15 0000) BP: (111-114)/(53-84) 111/53 (03/15 0000) SpO2:  [95 %-97 %] 97 % (03/15 0000) FiO2 (%):  [0 %] 0 % (03/14 2323)   Physical Exam  Constitutional: Appears well-developed and well-nourished.  Psych: Affect appropriate to situation Eyes: No scleral injection HENT: No OP obstrucion Head: Normocephalic.  Cardiovascular: Normal rate and regular rhythm.  Respiratory: Effort normal and breath sounds normal to anterior ascultation GI: Soft.  No distension. There is no tenderness.  Skin: WDI  Neuro: Mental Status: Patient is awake, alert, oriented to person, place, year, and situation. He is unable to give the month. World backwards is "D-L-O-R-W." he is able to give a number  of quarters in $2.75 Patient is able to give a clear and coherent history. No signs of aphasia or neglect Cranial Nerves: II: Visual Fields are full. Pupils are equal, round, and reactive to light.   III,IV, VI: EOMI without ptosis or diploplia.  V: Facial sensation is symmetric to temperature VII: Facial movement is symmetric.  VIII: hearing is intact to voice X: Uvula elevates symmetrically XI: Shoulder shrug is symmetric. XII: tongue is midline without atrophy or fasciculations.  Motor: Tone is normal. Bulk is normal. 5/5 strength was present in all four extremities.  Sensory: Sensation is symmetric to light touch and  temperature in the arms and legs. Cerebellar: FNF and HKS are intact bilaterally Gait: He has a narrow-based gait, has slightly everted feet bilaterally, he appears steady, no clear apraxia  I have reviewed labs in epic and the results pertinent to this consultation are: Mildly elevated creatinine  I have reviewed the images obtained: CT head-prominent ventricles bilaterally  Impression: 76 year old male with approximately 6 months of memory issues with prominent ventricles on CT. I do agree that these appear more prominent than I would expect it to solely due to atrophy, though this is not definite. Without a change or urinary incontinence, cognitive decline appears to be his only symptom which could be consistent with NPH.  Though I do think that further cognitive evaluation is indicated in his case, I'm not certain that in the setting of a GI illness is the appropriate time to pursue this.  The fact that he rebounded so quickly with IV fluids makes me think that this could've been due to mild dehydration, or could simply be due to a viral syndrome.  Recommendations: 1) TSH, B12 2) I would consider outpatient neurology follow-up with consideration of large volume LP done once acute medical issues are resolved.   Roland Rack, MD [facility] [phone]  If 7pm- 7am, please page neurology on call as listed in [place].

## 2017-02-05 NOTE — Telephone Encounter (Signed)
Take lower dose of Coreg for 2-3 days, then resume his prior dose when viral illness appears to have subsided.  Also ok to hold his torsemide for a couple of days but would resume when viral illness subsides.  If he has continuing dizziness, needs to see [place] in office.

## 2017-02-05 NOTE — Care Management Note (Signed)
Case Management Note  Patient Details  Name: Justin Hill MRN: [id number] Date of Birth: 1940-12-10  Subjective/Objective:                    Action/Plan: Patient discharging home with self care and his wife. Pt has insurance, PCP and transportation home. No further needs per CM.  Expected Discharge Date:  02/05/17               Expected Discharge Plan:  Home/Self Care  In-House Referral:     Discharge planning Services     Post Acute Care Choice:    Choice offered to:     DME Arranged:    DME Agency:     HH Arranged:    HH Agency:     Status of Service:  Completed, signed off  If discussed at [employer] of Stay Meetings, dates discussed:    Additional Comments:  Pollie Friar, RN 02/05/2017, 12:54 PM

## 2017-02-05 NOTE — Telephone Encounter (Signed)
Wife called.  She is concerned about medication changes done by the ER today.  Carvedilol was decreased from 25 mg twice a day to 3.125 mg twice a day by the ER physician.  Torsemide dosage is on hold until 3/19.    Patient was 1st seen at [facility] ER due to symptoms of dry heaves, lethargy, shuffling gain and BP was low in ER.   He was given IV fluids at [facility] and then was suggested he have a CT scan done at [facility] and was sent to [facility] ER yesterday.   The diagnosis was viral infection with dehydration (see ER notes).    Wife's question is should patient take Carvedilol 3.125 twice a day as ordered by ER physician or should he resume previous dosage of 25 mg bid.  Also wanted to confirm ok to hold Torsemide to 3/19.   She has been instructed to make a post hospital office visit with Chanetta Marshall, NP.    Advised would send to Dr Aundra Dubin for recommendation.

## 2017-02-06 ENCOUNTER — Telehealth: Payer: Self-pay | Admitting: Endocrinology

## 2017-02-06 LAB — HEMOGLOBIN A1C
Hgb A1c MFr Bld: 8.2 % — ABNORMAL HIGH (ref 4.8–5.6)
Mean Plasma Glucose: 189 mg/dL

## 2017-02-06 NOTE — Telephone Encounter (Signed)
Patient wife stated husband just got out of the hospital and Doctor told him to no longer take his insulin Novolin N Relion, she is confused about what to take. Please advised

## 2017-02-06 NOTE — Telephone Encounter (Signed)
Pt's wife called back this morning and I spoke with her about what Dr. Aundra Dubin recommends.  She understands and is agreeable with plan.  She will call back if she has any further questions.

## 2017-02-09 NOTE — Telephone Encounter (Signed)
Requested a call back to further discuss.  

## 2017-02-09 NOTE — Telephone Encounter (Signed)
Please resume the same insulins: 90 units of NPH each morning  With supper, take 30 units of "70/30."

## 2017-02-09 NOTE — Telephone Encounter (Signed)
Patient's wife called back and stated on 02/04/2017 the patient went to the ED and was taking off his Novolin NPH insulin. She stated since then his blood sugar has been ranging between 180-260. She wanted to verify if the patient should continue to stay off the insulin? Please advise, Thanks!

## 2017-02-09 NOTE — Telephone Encounter (Signed)
Pt's wife is returning your call, she requests call back.

## 2017-02-09 NOTE — Telephone Encounter (Signed)
Attempted to reach the pateint's wife. She was not available, will try again at a later time.

## 2017-02-10 ENCOUNTER — Ambulatory Visit: Payer: Medicare Other | Admitting: Podiatry

## 2017-02-10 NOTE — Telephone Encounter (Signed)
I contacted the patient's wife and advised of message. She voiced understanding and had no further questions at this time.

## 2017-02-11 ENCOUNTER — Encounter: Payer: Self-pay | Admitting: Nurse Practitioner

## 2017-02-11 DIAGNOSIS — E860 Dehydration: Secondary | ICD-10-CM | POA: Diagnosis not present

## 2017-02-11 DIAGNOSIS — G9341 Metabolic encephalopathy: Secondary | ICD-10-CM | POA: Diagnosis not present

## 2017-02-11 DIAGNOSIS — R413 Other amnesia: Secondary | ICD-10-CM | POA: Diagnosis not present

## 2017-02-11 DIAGNOSIS — G919 Hydrocephalus, unspecified: Secondary | ICD-10-CM | POA: Diagnosis not present

## 2017-02-12 ENCOUNTER — Other Ambulatory Visit (HOSPITAL_COMMUNITY): Payer: Self-pay

## 2017-02-12 ENCOUNTER — Telehealth (HOSPITAL_COMMUNITY): Payer: Self-pay

## 2017-02-12 ENCOUNTER — Ambulatory Visit (INDEPENDENT_AMBULATORY_CARE_PROVIDER_SITE_OTHER): Payer: Medicare Other | Admitting: Neurology

## 2017-02-12 ENCOUNTER — Encounter: Payer: Self-pay | Admitting: Neurology

## 2017-02-12 VITALS — BP 108/62 | HR 78 | Resp 16 | Ht 71.0 in | Wt 237.0 lb

## 2017-02-12 DIAGNOSIS — R400 Somnolence: Secondary | ICD-10-CM | POA: Diagnosis not present

## 2017-02-12 DIAGNOSIS — E1165 Type 2 diabetes mellitus with hyperglycemia: Secondary | ICD-10-CM | POA: Diagnosis not present

## 2017-02-12 DIAGNOSIS — R4182 Altered mental status, unspecified: Secondary | ICD-10-CM

## 2017-02-12 DIAGNOSIS — Z9289 Personal history of other medical treatment: Secondary | ICD-10-CM | POA: Diagnosis not present

## 2017-02-12 DIAGNOSIS — I4892 Unspecified atrial flutter: Secondary | ICD-10-CM

## 2017-02-12 DIAGNOSIS — I480 Paroxysmal atrial fibrillation: Secondary | ICD-10-CM

## 2017-02-12 MED ORDER — CARVEDILOL 25 MG PO TABS: 12.5000 mg | ORAL_TABLET | Freq: Every day | ORAL | 6 refills | Status: DC

## 2017-02-12 NOTE — Telephone Encounter (Signed)
Patient's wife called to update [place] on BP since last week. States PCP saw him and decreased coreg to 12.5 mg twice daily (we had advised him to take 3.125 mg BID for 3 days then go back to original dose of 25 mg BID). BP today is 106/58, no dizziness reported, however, has been a little confused and saw neurology for this and is pending a sleep study. Will forward to Dr. Aundra Dubin to make him aware of the carvedilol/coreg dose change per PCP to see if any further changes need to be made. Due to follow up in July.  Renee Pain, RN

## 2017-02-12 NOTE — Patient Instructions (Addendum)
Based on your symptoms and your exam I believe you are at risk for obstructive sleep apnea or OSA, and I think we should proceed with a sleep study to determine whether you do or do not have OSA and how severe it is. If you have more than mild OSA, I want you to consider treatment with CPAP. Please remember, the risks and ramifications of moderate to severe obstructive sleep apnea or OSA are: Cardiovascular disease, including congestive heart failure, stroke, difficult to control hypertension, arrhythmias, and even type 2 diabetes has been linked to untreated OSA. Sleep apnea causes disruption of sleep and sleep deprivation in most cases, which, in turn, can cause recurrent headaches, problems with memory, mood, concentration, focus, and vigilance. Most people with untreated sleep apnea report excessive daytime sleepiness, which can affect their ability to drive. Please do not drive if you feel sleepy.   Your mental state changes are likely due to a combination of things:   Diabetes, dehydration, kidney disease, poor sleep.   You are at fall risk.   I am not sure, why you are on Imuran, please find out.   I will likely see you back after your sleep study to go over the test results and where to go from there. We will call you after your sleep study to advise about the results (most likely, you will hear from Beverlee Nims, my nurse) and to set up an appointment at the time, as necessary.    Our sleep lab administrative assistant, Arrie Aran will meet with you or call you to schedule your sleep study. If you don't hear back from her by next week please feel free to call her at [phone]. This is her direct line and please leave a message with your phone number to call back if you get the voicemail box. She will call back as soon as possible.

## 2017-02-12 NOTE — Telephone Encounter (Signed)
Leave Coreg at 12.5 mg bid.

## 2017-02-12 NOTE — Progress Notes (Signed)
Subjective:    Patient ID: Justin Hill is a 76 y.o. male.  HPI     Star Age, MD, PhD [facility] [address] [place], [place] [address]  Dear Dr. Cathi Roan,   I saw your patient, Justin Hill, upon your kind request in my neurologic clinic today for initial consultation of his sleep disorder, in particular, concern for increase in daytime sleepiness. The patient is accompanied by his wife today. As you know, Justin Hill is a 76 year old right-handed gentleman with an underlying complex medical history of type 2 diabetes, memory loss, congestive heart failure, cardiomyopathy, memory loss, PAF, atrial flutter, s/p defib placement in 7/17, hypertension, hypothyroidism, chronic kidney disease, allergic rhinitis, asthma, history of pulmonary hypertension, peripheral neuropathy, and obesity, who reports some snoring and excessive daytime somnolence. I reviewed your office note from 02/03/2017 which you kindly included.  He has no AM HAs, has nocturia once per night. He used to snore when he was heavier per wife. He has started being more sleepy during the day in the past week or 2. He has been on Imuran for unclear reasons. Wife reports that he was on methotrexate but had side effects and was switched to Imuran. This is for a rash as I understand. He was recently also started on Namzaric but had significant side effects after 2 or 3 days including swelling of his legs and his wife stopped the medication after which the swelling improved.  He had a recent hospitalization on 02/04/17 to 02/05/2017 secondary to altered mental status. He was found to be dehydrated. CT head without contrast showed prominent ventricles and the context of atrophy. Some of his medications were discontinued. A1c was above 8, he had elevated glucose levels. He is creatinine level was elevated. TSH was normal, B12 and the lower end of normal. Lipid panel was  unremarkable.  His Epworth sleepiness score is 14 out of 24, fatigue score is 63 out of 63. He quit smoking over 40 years ago, does not drink alcohol and no daily caffeine. He is retired. He lives with his wife. They have one child.  His Past Medical History Is Significant For: Past Medical History:  Diagnosis Date  . Altered mental status   . Cardiomyopathy   . CHF (congestive heart failure) ([place])   . Chronic kidney disease (CKD), stage III (moderate)   . Communicating hydrocephalus   . Diabetes mellitus   . Diverticulosis   . Hypercholesterolemia   . Hyperglycemia   . Hypertension   . Paroxysmal atrial fibrillation (HCC)   . Personal history of kidney stones     His Past Surgical History Is Significant For: Past Surgical History:  Procedure Laterality Date  . ATRIAL FLUTTER ABLATION N/A 01/19/2015   Procedure: ATRIAL FLUTTER ABLATION;  Surgeon: [name], MD;  Location: [facility] CATH LAB;  Service: Cardiovascular;  Laterality: N/A;  . CARDIAC CATHETERIZATION N/A 05/02/2016   Procedure: Right/Left Heart Cath and Coronary Angiography;  Surgeon: Larey Dresser, MD;  Location: [facility] CV LAB;  Service: Cardiovascular;  Laterality: N/A;  . COLONOSCOPY N/A 12/29/2013   Procedure: COLONOSCOPY;  Surgeon: Lafayette Dragon, MD;  Location: [facility] ENDOSCOPY;  Service: Endoscopy;  Laterality: N/A;  . EP IMPLANTABLE DEVICE N/A 06/05/2016   MDT Hillery Aldo XT CRTD implanted by Dr Rayann Heman   . [place]   left. Muscle reattachment  . TOE SURGERY Right 12/2012   2nd toe  . TONSILLECTOMY  His Family History Is Significant For: Family History  Problem Relation Age of Onset  . Heart failure Maternal Grandmother   . Diabetes type II Sister 41  . Hyperlipidemia Other     Parent  . Stroke Other     Parent  . Hypertension Other     Parent  . Diabetes Other     Parent  . Colon cancer Neg Hx   . Heart attack Neg Hx     His Social History Is Significant For: Social History   Social  History  . Marital status: Married    Spouse name: Justin Hill  . Number of children: 1  . Years of education: 4   Occupational History  . retired    Social History Main Topics  . Smoking status: Former Smoker    Types: Cigarettes  . Smokeless tobacco: Never Used  . Alcohol use No  . Drug use: No  . Sexual activity: Not Asked   Other Topics Concern  . None   Social History Narrative   Regular exercise-no   Rare caffeine use        His Allergies Are:  Allergies  Allergen Reactions  . Aspirin Other (See Comments)    Burps, burning, stomach pains, etc  . Lisinopril Cough    Severe coughing  . Penicillins Other (See Comments)    syncope  :   His Current Medications Are:  Outpatient Encounter Prescriptions as of 02/12/2017  Medication Sig  . Ascorbic Acid (VITAMIN C) 500 MG CAPS Take 1 capsule by mouth 2 (two) times daily.   Marland Kitchen azaTHIOprine (IMURAN) 50 MG tablet Take 50 mg by mouth daily.   . Bilberry, Vaccinium myrtillus, (BILBERRY PO) Take by mouth.  . carvedilol (COREG) 25 MG tablet Take 25 mg by mouth daily.  . digoxin (LANOXIN) 0.125 MG tablet TAKE ONE TABLET BY MOUTH ONCE DAILY  . insulin NPH-regular Human (NOVOLIN 70/30) (70-30) 100 UNIT/ML injection Inject 30 Units into the skin daily with supper.  . Insulin Zinc Human (NOVOLIN L [place]) Inject 90 Units into the skin.  . Multiple Vitamin (MULTIVITAMIN) tablet Take 1 tablet by mouth daily.    . sacubitril-valsartan (ENTRESTO) 97-103 MG Take 1 tablet by mouth 2 (two) times daily.  . simvastatin (ZOCOR) 40 MG tablet Take 40 mg by mouth at bedtime.    . tamsulosin (FLOMAX) 0.4 MG CAPS capsule Take 0.4 mg by mouth daily.   Marland Kitchen torsemide (DEMADEX) 10 MG tablet Take 1 tablet (10 mg total) by mouth every other day.  Alveda Reasons 20 MG TABS tablet TAKE ONE TABLET BY MOUTH ONCE DAILY WITH  SUPPER  . [DISCONTINUED] carvedilol (COREG) 3.125 MG tablet Take 1 tablet (3.125 mg total) by mouth 2 (two) times daily with a meal.   No  facility-administered encounter medications on file as of 02/12/2017.   :  Review of Systems:  Out of a complete 14 point review of systems, all are reviewed and negative with the exception of these symptoms as listed below: Review of Systems  Neurological:       Wife states that patient has had increased fatigue in the last 2 weeks. Snores, wakes up feeling tired, daytime fatigue, takes naps.    Epworth Sleepiness Scale 0= would never doze 1= slight chance of dozing 2= moderate chance of dozing 3= high chance of dozing  Sitting and reading:0 Watching TV:3 Sitting inactive in a public place (ex. Theater or meeting):2 As a passenger in a car for an  hour without a break:1 Lying down to rest in the afternoon:3 Sitting and talking to someone:1 Sitting quietly after lunch (no alcohol):3 In a car, while stopped in traffic:1 Total:14  Objective:  Neurologic Exam  Physical Exam Physical Examination:   Vitals:   02/12/17 1048  BP: 108/62  Pulse: 78  Resp: 16    General Examination: The patient is a very pleasant 76 y.o. male in no acute distress. He appears frail and deconditioned. He is adequately groomed.   HEENT: Normocephalic, atraumatic, pupils are equal, round and reactive to light and accommodation. Extraocular tracking is mildly difficult for him. Hearing is grossly intact. Face is symmetric with normal facial animation and normal facial sensation. Speech is clear with no dysarthria noted. There is no hypophonia. There is no lip, neck/head, jaw or voice tremor. Neck is supple with full range of passive and active motion. There are no carotid bruits on auscultation. Oropharynx exam reveals: moderate mouth dryness, adequate dental hygiene and mild airway crowding, due to redundant soft palate. Mallampati is class II. Neck circumference is 17-3/4 inches. Tongue protrudes centrally and palate elevates symmetrically. Tonsils are absent.   Chest: Clear to auscultation without  wheezing, rhonchi or crackles noted.  Heart: S1+S2+0, regular and normal without murmurs, rubs or gallops noted.   Abdomen: Soft, non-tender and non-distended with normal bowel sounds appreciated on auscultation.  Extremities: There is trace pitting edema in the distal lower extremities bilaterally.  Skin: Warm and dry without trophic changes noted.  Musculoskeletal: exam reveals no obvious joint deformities, tenderness or joint swelling or erythema.   Neurologically:  Mental status: The patient is awake, alert and oriented in all 4 spheres. His immediate and remote memory, attention, language skills and fund of knowledge are mildly impaired.  mood appears to be normal, affect mildly blunted. Cranial nerves are under HEENT exam. Motor exam reveals normal bulk, global strength of 4+ out of 5, Romberg is not testable safely, reflexes are 1+. Fine motor skills are mildly impaired globally, sensory exam is intact to light touch, pinprick, temperature and vibration in the upper extremities but decrease to pinprick and temperature sensation in the distal lower extremities bilaterally.  Cerebellar testing: No dysmetria or intention tremor.  Gait, station and balance: He stands with difficulty. He walks slowly and cautiously, tandem walk is not possible. Balance is mildly impaired.   Assessment and Plan:  In summary, Justin Hill is a very pleasant 76 y.o.-year old male with an underlying complex medical history of type 2 diabetes, memory loss, congestive heart failure, cardiomyopathy, memory loss, PAF, atrial flutter, s/p defib placement in 7/17, hypertension, hypothyroidism, chronic kidney disease, allergic rhinitis, asthma, history of pulmonary hypertension, peripheral neuropathy, and obesity, whose history and physical exam are concerning for obstructive sleep apnea (OSA).he had a recent hospitalization for altered mental status and workup for this. He was found to be dehydrated, in addition, he  has chronic kidney disease, poorly controlled diabetes, evidence of diabetic neuropathy, had a recent fall and is at fall risk. We talked about his head CT results, these showed nonspecific findings and prominent ventricles, likely in the context of atrophy. I explained to the patient and his wife that his mental status changes are most likely secondary to multiple issues including polypharmacy, aging, dehydration, worsening kidney disease, suboptimally controlled diabetes. He is at fall risk secondary to these issues as well. I suggested we proceed with sleep study testing at this time to rule out obstructive sleep apnea and consider treatment for  this to help with daytime somnolence. He is advised to talk to his dermatologist about the need or indication for Imuran.  I explained the sleep test procedure to the patient and also outlined possible surgical and non-surgical treatment options of OSA.  I also explained the CPAP treatment option to the patient.  I answered all their questions today and the patient and his wife were in agreement. I would like to see him back after the sleep study is completed and encouraged him to call with any interim questions, concerns, problems or updates.   Thank you very much for allowing me to participate in the care of this nice patient. If I can be of any further assistance to you please do not hesitate to call me at [phone].  Sincerely,   Star Age, MD, PhD

## 2017-02-13 MED ORDER — CARVEDILOL 25 MG PO TABS: 12.5000 mg | ORAL_TABLET | Freq: Two times a day (BID) | ORAL | 6 refills | Status: DC

## 2017-02-13 NOTE — Telephone Encounter (Signed)
Pt's wife aware and agreeable 

## 2017-02-19 ENCOUNTER — Telehealth: Payer: Self-pay | Admitting: *Deleted

## 2017-02-19 ENCOUNTER — Ambulatory Visit (INDEPENDENT_AMBULATORY_CARE_PROVIDER_SITE_OTHER): Payer: Medicare Other | Admitting: Neurology

## 2017-02-19 ENCOUNTER — Ambulatory Visit (INDEPENDENT_AMBULATORY_CARE_PROVIDER_SITE_OTHER): Payer: Medicare Other

## 2017-02-19 DIAGNOSIS — I5022 Chronic systolic (congestive) heart failure: Secondary | ICD-10-CM | POA: Diagnosis not present

## 2017-02-19 DIAGNOSIS — G4733 Obstructive sleep apnea (adult) (pediatric): Secondary | ICD-10-CM | POA: Diagnosis not present

## 2017-02-19 DIAGNOSIS — G4720 Circadian rhythm sleep disorder, unspecified type: Secondary | ICD-10-CM

## 2017-02-19 DIAGNOSIS — G479 Sleep disorder, unspecified: Secondary | ICD-10-CM

## 2017-02-19 DIAGNOSIS — Z95810 Presence of automatic (implantable) cardiac defibrillator: Secondary | ICD-10-CM | POA: Diagnosis not present

## 2017-02-19 DIAGNOSIS — R9431 Abnormal electrocardiogram [ECG] [EKG]: Secondary | ICD-10-CM

## 2017-02-19 NOTE — Telephone Encounter (Signed)
LMOVM for patient's wife (DPR).  Advised that I reviewed the ATP episode with Dr. Curt Bears, who recommended that he keep his 03/12/17 f/u appointment with Chanetta Marshall, NP.  He did not recommend additional changes prior to this appointment.  [facility] phone number for any additional questions or concerns.  Per Dr. Curt Bears, episode appears AF w/RVR rather than VT/VF.    Episode placed in Dr. Jackalyn Lombard red folder for review.

## 2017-02-19 NOTE — Telephone Encounter (Signed)
Spoke with patient's wife regarding "VF" episode on 02/06/17, treated with ATP during charging.  Rate fell below detection so shock was aborted.  AT/AF burden increased to 10.5% since last interrogation on 01/19/17, BiV pacing decreased to 89.9%.  As/Vp presenting.  Will review episode with EP.  Per patient's wife, patient was admitted to [facility] on 3/14 for dehydration, then transferred to [facility] on 3/15 and subsequently discharged.  She states he was asymptomatic with treated episode on 3/16 and has been feeling well since discharge.  She reports that he has not reported any chest discomfort, palpitations, dizziness, or other cardiac symptoms.  His carvedilol was decreased to 12.5mg  BID and his BP is now running 105/50s-140/58 per his wife.    Advised patient's wife that I will review episode with EP and call her back with any additional recommendations.  She verbalizes understanding and denies additional questions at this time.

## 2017-02-19 NOTE — Progress Notes (Signed)
EPIC Encounter for ICM Monitoring  Patient Name: Justin Hill is a 76 y.o. male Date: 02/19/2017 Primary Care Physican: Cyndy Freeze, MD Primary Cardiologist:[place] Electrophysiologist: Allred Dry Weight:unknown  Bi-V Pacing: 89.9%  Clinical Status Since 19-Jan-2017 Treated VF 1  Monitored VT (171-200 bpm) 8 VT-NS (>4 beats, >200 bpm) 9 High Rate-NS 14 SVT: VT/VF Rx Withheld 0 V. Oversensing-TWave Rx Withheld 0 V. Oversensing-Noise Rx Withheld 0 AT/AF 409 Time in AT/AF 2.5 hr/day (10.5%) Longest AT/AF 3 hours   Spoke with wife.  Heart Failure questions reviewed, pt asymptomatic.   Thoracic impedance normal but was abnormal suggesting fluid accumulation 01/31/2017 to 02/14/2017.  Prescribed and confirmed dosage: Torsemide 10 mg 1 tablet every other day.   Labs: 01/09/2017 Creatinine 1.42, BUN 15, Potassium 4.6, Sodium 136, EGFR 47-55 12/22/2016 Creatinine 1.56, BUN 22, Potassium 5.0, Sodium 138, EGFR 43-50 12/02/2016 Creatinine 1.20, BUN 14, Potassium 4.9, Sodium 138, EGFR >60 10/03/2016 Creatinine 1.27, BUN 16, Potassium 4.9, Sodium 135, EGFR 54->60  08/01/2016 Creatinine 1.21, BUN 16, Potassium 5.0, Sodium 138, EGFR 59-68  07/23/2016 Creatinine 1.52, BUN 19, Potassium 4.5, Sodium 136  05/29/2016 Creatinine 1.26, BUN 15, Potassium 4.6, Sodium 141  04/30/2016 Creatinine 1.17, BUN 9, Potassium 4.9, Sodium 138  12/28/2015 Creatinine 1.35, BUN 13, Potassium 4.9, Sodium 142  11/30/2016Creatinine 1.37, BUN 20, Potassium 4.3, Sodium 139   Recommendations:  Patient's wife received call today from Levander Campion, device RN regarding VT.  No changes. Reminded to limit dietary salt intake to 2000 mg/day and fluid intake to < 2 liters/day. Encouraged to call for fluid symptoms.  Follow-up plan: ICM clinic phone appointment on 04/13/2017.  Defib office check scheduled 03/12/2017.  Copy of ICM check sent to primary cardiologist and device  physician.   3 month ICM trend: 02/19/2017      1 Year ICM trend:      Rosalene Billings, RN 02/19/2017 9:12 AM

## 2017-02-20 ENCOUNTER — Ambulatory Visit: Payer: Medicare Other | Admitting: Podiatry

## 2017-02-20 NOTE — Addendum Note (Signed)
Addended by: Star Age on: 02/20/2017 01:57 PM   Modules accepted: Orders

## 2017-02-20 NOTE — Procedures (Signed)
PATIENT'S NAME:  Justin Hill, Justin Hill DOB:      [birth year]      MR#:    [id number]     DATE OF RECORDING: 02/19/2017 REFERRING M.D.:  Cyndy Freeze, MD Study Performed:  Split-Night Titration Study HISTORY: 76 year old man with a complex medical history of type 2 diabetes, memory loss, congestive heart failure, cardiomyopathy, memory loss, PAF, atrial flutter, s/p defib placement in 7/17, hypertension, hypothyroidism, chronic kidney disease, allergic rhinitis, asthma, history of pulmonary hypertension, peripheral neuropathy, and obesity, who reports some snoring and excessive daytime somnolence. The patient endorsed the Epworth Sleepiness Scale at 14 points. The patient's weight 237 pounds with a height of 71 (inches), resulting in a BMI of 33.3 kg/m2. The patient's neck circumference measured 17.8 inches.  CURRENT MEDICATIONS: Vitamin C, Imuran, Bilberry, Coreg, Lanoxin, Novolin, Multivitamin, Entresto, Zocor, Flomax, Demadex  PROCEDURE:  This is a multichannel digital polysomnogram utilizing the Somnostar 11.2 system.  Electrodes and sensors were applied and monitored per AASM Specifications.   EEG, EOG, Chin and Limb EMG, were sampled at 200 Hz.  ECG, Snore and Nasal Pressure, Thermal Airflow, Respiratory Effort, CPAP Flow and Pressure, Oximetry was sampled at 50 Hz. Digital video and audio were recorded.      BASELINE STUDY WITHOUT CPAP RESULTS:  Lights Out was at 22:09 and Lights On at 04:59 for the night. Total recording time (TRT) was 165, with a total sleep time (TST) of 125 minutes. The patient's sleep latency was 20 minutes.  REM latency was 107.5 minutes.  The sleep efficiency was 75.8 %.    SLEEP ARCHITECTURE: WASO (Wake after sleep onset) was 26 minutes with moderate to severe sleep fragmentation noted, Stage N1 was 12.5 minutes, Stage N2 was 86.5 minutes, Stage N3 was 3 minutes and Stage R (REM sleep) was 23 minutes.  The percentages were Stage N1 10.%, which is increased, Stage N2  69.2%, which is increased, Stage N3 2.4%, Stage R (REM sleep) 18.4%.   The arousals were noted as: 35 were spontaneous, 0 were associated with PLMs, 80 were associated with respiratory events.   RESPIRATORY ANALYSIS:  There were a total of 106 respiratory events:  40 obstructive apneas, 1 central apneas and 2 mixed apneas with a total of 43 apneas and an apnea index (AI) of 20.6. There were 63 hypopneas with a hypopnea index of 30.2. The patient also had 0 respiratory event related arousals (RERAs).  Snoring was noted.     The total APNEA/HYPOPNEA INDEX (AHI) was 50.9 /hour and the total RESPIRATORY DISTURBANCE INDEX was 50.9 /hour.  18 events occurred in REM sleep and 102 events in NREM. The REM AHI was 47., /hour versus a non-REM AHI of 51.8 /hour. The patient spent 125.5 minutes sleep time in the supine position 62 minutes in non-supine. The supine AHI was 50.8 /hour versus a non-supine AHI of 0.0 /hour.  OXYGEN SATURATION & C02:  The wake baseline 02 saturation was 92%, with the lowest being 82%. Time spent below 89% saturation equaled 18 minutes.  PERIODIC LIMB MOVEMENTS: The patient had a total of 0 Periodic Limb Movements.  The Periodic Limb Movement (PLM) index was 0 /hour and the PLM Arousal index was 0 /hour.  TITRATION STUDY WITH CPAP RESULTS:   The patient was fitted with a medium Simplus FFM. CPAP was initiated at 5 cmH20 with heated humidity per AASM split night standards and pressure was advanced to 8 cmH20 because of hypopneas, apneas and desaturations. Unfortunately, the patient achieved with little  amount of sleep post CPAP and an optimal pressure was not reliably obtained. At a PAP pressure of 8 cmH20, the AHI was 0/hour, with minimal non supine and non REM sleep achieved.   Total recording time (TRT) was 245.5 minutes, with a total sleep time (TST) of 62.5 minutes. The patient's sleep latency was 43 minutes. REM latency was 0 minutes.  The sleep efficiency was 25.5 %, which is  severely decreased.    SLEEP ARCHITECTURE: Wake after sleep was 130.5 minutes, Stage N1 9.5 minutes, Stage N2 53 minutes, Stage N3 0 minutes and Stage R (REM sleep) 0 minutes. The percentages were: Stage N1 15.2%, which is increased, Stage N2 84.8%, which is markedly increased, Stage N3 and Stage R (REM sleep) were absent.   The arousals were noted as: 11 were spontaneous, 0 were associated with PLMs, 3 were associated with respiratory events.  RESPIRATORY ANALYSIS:  There were a total of 9 respiratory events: 1 obstructive apneas, 3 central apneas and 1 mixed apneas with a total of 5 apneas and an apnea index (AI) of 4.8. There were 4 hypopneas with a hypopnea index of 3.8 /hour. The patient also had 0 respiratory event related arousals (RERAs).      The total APNEA/HYPOPNEA INDEX  (AHI) was 8.6 /hour and the total RESPIRATORY DISTURBANCE INDEX was 8.6 /hour.  0 events occurred in REM sleep and 9 events in NREM. The REM AHI was 0 /hour versus a non-REM AHI of 8.6 /hour. REM sleep was achieved on a pressure of  cm/h2o (AHI was  .) The patient spent 1% of total sleep time in the supine position. The supine AHI was 0.0 /hour, versus a non-supine AHI of 8.7/hour.  OXYGEN SATURATION & C02:  The wake baseline 02 saturation was 94%, with the lowest being 88%. Time spent below 89% saturation equaled 1 minutes.  PERIODIC LIMB MOVEMENTS:    The patient had a total of 0 Periodic Limb Movements. The Periodic Limb Movement (PLM) index was 0 /hour and the PLM Arousal index was 0 /hour.   Post-study, the patient indicated that sleep was better than usual.  POLYSOMNOGRAPHY IMPRESSION :   1. Obstructive Sleep Apnea (OSA)  2. Nonspecific abnormal EKG  3. Dysfunctions associated with sleep stages or arousals from sleep 4. Repetitive Intrusions of Sleep  RECOMMENDATIONS:  1. This patient has severe obstructive sleep apnea but an optimal CPAP pressure was not determined during this study, due to only minimal  sleep achieved during the titration portion of the study. He will be advised to return for a full night PAP titration study to optimize treatment and ensure tolerance of treatment.  2. Please note that untreated obstructive sleep apnea carries additional perioperative morbidity. Patients with significant obstructive sleep apnea should receive perioperative PAP therapy and the surgeons and particularly the anesthesiologist should be informed of the diagnosis and the severity of the sleep disordered breathing. 3. This study shows sleep fragmentation and abnormal sleep stage percentages; these are nonspecific findings and per se do not signify an intrinsic sleep disorder or a cause for the patient's sleep-related symptoms. Causes include (but are not limited to) the first night effect of the sleep study, circadian rhythm disturbances, medication effect or an underlying mood disorder or medical problem.  4. The study showed occasional PVCs on single lead EKG; clinical correlation is recommended.  5. The patient should be cautioned not to drive, work at heights, or operate dangerous or heavy equipment when tired or sleepy. Review and  reiteration of good sleep hygiene measures should be pursued with any patient. 6. The patient will be seen in follow-up by Dr. Rexene Alberts at [facility] for discussion of the test results and further management strategies. The referring provider will be notified of the test results.   I certify that I have reviewed the entire raw data recording prior to the issuance of this report in accordance with the Standards of Accreditation of the American Academy of Sleep Medicine (AASM)       Star Age, MD, PhD Diplomat, American Board of Psychiatry and Neurology (Neurology and Sleep Medicine)

## 2017-02-20 NOTE — Progress Notes (Signed)
Patient referred by Dr. Cathi Roan, seen by me on 02/12/17, diagnostic PSG on 02/19/17.    Please call and notify the patient that the recent sleep study did confirm the diagnosis of severe obstructive sleep apnea and that I recommend treatment for this in the form of CPAP. He did not sleep enough during the attempted split/the titration portion of the study. Therefore, a second sleep study for proper titration and mask fitting is needed, so we can optimize treatment and ensure tolerance of treatment. Please explain to patient and arrange for a CPAP titration study. I have placed an order in the chart. Thanks, and please route to [facility] for scheduling next sleep study.  Star Age, MD, PhD [facility])

## 2017-02-22 ENCOUNTER — Encounter: Payer: Self-pay | Admitting: Neurology

## 2017-02-24 ENCOUNTER — Telehealth: Payer: Self-pay

## 2017-02-24 NOTE — Telephone Encounter (Signed)
In MyChart message patient also asked about being seen for memory issues. I call patient's PCP and advised them if this is ok with PCP, we need a new referral. Nurse voiced understanding and will clear it through PCP.

## 2017-02-24 NOTE — Telephone Encounter (Signed)
Wife called back and I gave results and recommendations. She states that she will try to talk patient into coming back for the sleep study, she said that he really does not want too. I explained to her the purpose and why this is important. She voiced understanding.  I advised her that I called PCP's office and said that if patient needs to be seen for memory, we need a new referral. This wife and I talked about side effects of OSA including memory loss.

## 2017-02-24 NOTE — Telephone Encounter (Signed)
LM for patient to call back. I also advised that I will respond back to MyChart message.

## 2017-02-26 ENCOUNTER — Telehealth: Payer: Self-pay | Admitting: Neurology

## 2017-02-26 NOTE — Telephone Encounter (Signed)
I received a referral from Dr Leonette Most office for this patient for memory loss. I called and spoke to pt wife to schedule appt and she said he has the appt for 03/11/17 and wants  to know if she should wait till after that appt to schedule because she thought that his sleep issues could cause some memory issues can you please call her back? Thanks dg

## 2017-02-26 NOTE — Telephone Encounter (Signed)
The memory referral was initiated because of the wife's request. So it is ok to hold off and complete the sleep studies before we see him back.

## 2017-02-27 NOTE — Telephone Encounter (Signed)
I called patient wife back to let her know she can wait on the appt for memory until after the sleep study dg

## 2017-03-05 DIAGNOSIS — L300 Nummular dermatitis: Secondary | ICD-10-CM | POA: Diagnosis not present

## 2017-03-05 DIAGNOSIS — L299 Pruritus, unspecified: Secondary | ICD-10-CM | POA: Diagnosis not present

## 2017-03-06 ENCOUNTER — Ambulatory Visit (INDEPENDENT_AMBULATORY_CARE_PROVIDER_SITE_OTHER): Payer: Medicare Other | Admitting: Neurology

## 2017-03-06 DIAGNOSIS — G4733 Obstructive sleep apnea (adult) (pediatric): Secondary | ICD-10-CM

## 2017-03-06 DIAGNOSIS — G479 Sleep disorder, unspecified: Secondary | ICD-10-CM

## 2017-03-06 DIAGNOSIS — R9431 Abnormal electrocardiogram [ECG] [EKG]: Secondary | ICD-10-CM

## 2017-03-06 DIAGNOSIS — G4720 Circadian rhythm sleep disorder, unspecified type: Secondary | ICD-10-CM

## 2017-03-10 NOTE — Progress Notes (Deleted)
Electrophysiology Office Note Date: 03/10/2017  ID:  Justin Hill, DOB [birth year], MRN [id number]  PCP: Cyndy Freeze, MD Primary Cardiologist: Aundra Dubin Electrophysiologist: Allred  CC: Routine ICD follow-up  Justin Hill is a 76 y.o. male seen today for Dr Rayann Heman.  He presents today for routine electrophysiology followup.  He was hospitalized 01/2017 for lethargy felt to be 2/2 dehydration. Since last being seen in our clinic, the patient reports doing very well. He denies chest pain, palpitations, dyspnea, PND, orthopnea, nausea, vomiting, dizziness, syncope, edema, weight gain, or early satiety.  He has not had ICD shocks.   Device History: MDT CRTD implanted 2017 for ICM,CHF History of appropriate therapy: No - inappropriate ATP therapy for *** History of AAD therapy: No   Past Medical History:  Diagnosis Date  . Altered mental status   . Cardiomyopathy   . CHF (congestive heart failure) ([place])   . Chronic kidney disease (CKD), stage III (moderate)   . Communicating hydrocephalus   . Diabetes mellitus   . Diverticulosis   . Hypercholesterolemia   . Hyperglycemia   . Hypertension   . Paroxysmal atrial fibrillation ([facility])   . Personal history of kidney stones    Past Surgical History:  Procedure Laterality Date  . ATRIAL FLUTTER ABLATION N/A 01/19/2015   Procedure: ATRIAL FLUTTER ABLATION;  Surgeon: [name], MD;  Location: [facility] CATH LAB;  Service: Cardiovascular;  Laterality: N/A;  . CARDIAC CATHETERIZATION N/A 05/02/2016   Procedure: Right/Left Heart Cath and Coronary Angiography;  Surgeon: Larey Dresser, MD;  Location: [facility] CV LAB;  Service: Cardiovascular;  Laterality: N/A;  . COLONOSCOPY N/A 12/29/2013   Procedure: COLONOSCOPY;  Surgeon: Lafayette Dragon, MD;  Location: [facility] ENDOSCOPY;  Service: Endoscopy;  Laterality: N/A;  . EP IMPLANTABLE DEVICE N/A 06/05/2016   MDT Hillery Aldo XT CRTD implanted by Dr Rayann Heman   . [place]   left. Muscle  reattachment  . TOE SURGERY Right 12/2012   2nd toe  . TONSILLECTOMY      Current Outpatient Prescriptions  Medication Sig Dispense Refill  . Ascorbic Acid (VITAMIN C) 500 MG CAPS Take 1 capsule by mouth 2 (two) times daily.     . Bilberry, Vaccinium myrtillus, (BILBERRY PO) Take by mouth.    . carvedilol (COREG) 25 MG tablet Take 0.5 tablets (12.5 mg total) by mouth 2 (two) times daily with a meal. 30 tablet 6  . digoxin (LANOXIN) 0.125 MG tablet TAKE ONE TABLET BY MOUTH ONCE DAILY 90 tablet 3  . insulin NPH-regular Human (NOVOLIN 70/30) (70-30) 100 UNIT/ML injection Inject 30 Units into the skin daily with supper. 20 mL 11  . Insulin Zinc Human (NOVOLIN L [place]) Inject 90 Units into the skin.    . Multiple Vitamin (MULTIVITAMIN) tablet Take 1 tablet by mouth daily.      . sacubitril-valsartan (ENTRESTO) 97-103 MG Take 1 tablet by mouth 2 (two) times daily. 60 tablet 3  . simvastatin (ZOCOR) 40 MG tablet Take 40 mg by mouth at bedtime.      . tamsulosin (FLOMAX) 0.4 MG CAPS capsule Take 0.4 mg by mouth daily.     Marland Kitchen torsemide (DEMADEX) 10 MG tablet Take 1 tablet (10 mg total) by mouth every other day. 30 tablet 3  . XARELTO 20 MG TABS tablet TAKE ONE TABLET BY MOUTH ONCE DAILY WITH  SUPPER 90 tablet 3   No current facility-administered medications for this visit.     Allergies:   Aspirin;  Lisinopril; and Penicillins   Social History: Social History   Social History  . Marital status: Married    Spouse name: Rosemarie Beath  . Number of children: 1  . Years of education: 48   Occupational History  . retired    Social History Main Topics  . Smoking status: Former Smoker    Types: Cigarettes  . Smokeless tobacco: Never Used  . Alcohol use No  . Drug use: No  . Sexual activity: Not on file   Other Topics Concern  . Not on file   Social History Narrative   Regular exercise-no   Rare caffeine use        Family History: Family History  Problem Relation Age of Onset  . Heart  failure Maternal Grandmother   . Diabetes type II Sister 54  . Hyperlipidemia Other     Parent  . Stroke Other     Parent  . Hypertension Other     Parent  . Diabetes Other     Parent  . Colon cancer Neg Hx   . Heart attack Neg Hx     Review of Systems: All other systems reviewed and are otherwise negative except as noted above.   Physical Exam: VS:  There were no vitals taken for this visit. , BMI There is no height or weight on file to calculate BMI.  GEN- The patient is well appearing, alert and oriented x 3 today.   HEENT: normocephalic, atraumatic; sclera clear, conjunctiva pink; hearing intact; oropharynx clear; neck supple, no JVP Lymph- no cervical lymphadenopathy Lungs- Clear to ausculation bilaterally, normal work of breathing.  No wheezes, rales, rhonchi Heart- Regular rate and rhythm, no murmurs, rubs or gallops, PMI not laterally displaced GI- soft, non-tender, non-distended, bowel sounds present, no hepatosplenomegaly Extremities- no clubbing, cyanosis, or edema; DP/PT/radial pulses 2+ bilaterally MS- no significant deformity or atrophy Skin- warm and dry, no rash or lesion; ICD pocket well healed Psych- euthymic mood, full affect Neuro- strength and sensation are intact  ICD interrogation- reviewed in detail today,  See PACEART report  EKG:  EKG is ordered today. The ekg ordered today shows ***  Recent Labs: 04/30/2016: B Natriuretic Peptide 45.6 12/02/2016: Hemoglobin 13.5; Platelets 126 01/09/2017: BUN 15; Creatinine, Ser 1.42; Potassium 4.6; Sodium 136 02/05/2017: TSH 1.580   Wt Readings from Last 3 Encounters:  02/12/17 237 lb (107.5 kg)  01/27/17 245 lb (111.1 kg)  12/02/16 246 lb 12 oz (111.9 kg)     Other studies Reviewed: Additional studies/ records that were reviewed today include: AHF notes, Dr Jackalyn Lombard notes, hospital records   Assessment and Plan:  1.  Chronic systolic dysfunction euvolemic today EF improved with CRT Stable on an  appropriate medical regimen Normal ICD function See Pace Art report No changes today BMET today   2.  Paroxysmal atrial fibrillation Burden by device interrogation ***% V rates *** Continue Xarelto for CHADS2VASC of 3  3. OSA Compliance with CPAP encouraged  Followed by neurology    Current medicines are reviewed at length with the patient today.   The patient does not have concerns regarding his medicines.  The following changes were made today:  none  Labs/ tests ordered today include: BMET No orders of the defined types were placed in this encounter.    Disposition:   Follow up with ICM clinic, Dr Rayann Heman 6 months, AHF clinic 6 weeks      Signed, Chanetta Marshall, NP 03/10/2017 10:22 AM  [facility] [address] [place] [address] [phone] (office) [phone] (fax)

## 2017-03-11 NOTE — Addendum Note (Signed)
Addended by: Star Age on: 03/11/2017 08:37 AM   Modules accepted: Orders

## 2017-03-11 NOTE — Progress Notes (Signed)
Patient referred by Dr. Cathi Roan, seen by me on 02/12/17, diagnostic PSG on 02/19/17 (attempted split study, but did not sleep on CPAP), CPAP study on 03/06/17. Please call and inform patient that I have entered an order for treatment with positive airway pressure (PAP) treatment of obstructive sleep apnea (OSA). He did not sleep very well again during this latest sleep study with CPAP and also tried BiPAP. Nevertheless, given his medical and cardiac Hx, I would like to try him on home CPAP of 7 cm (empirically). We will, therefore, arrange for a machine for home use through a DME (durable medical equipment) company of His choice; and I will see the patient back in follow-up in about 10 weeks. Please also explain to the patient that I will be looking out for compliance data, which can be downloaded from the machine (stored on an SD card, that is inserted in the machine) or via remote access through a modem, that is built into the machine. At the time of the followup appointment we will discuss sleep study results and how it is going with PAP treatment at home. Please advise patient to bring His machine at the time of the first FU visit, even though this is cumbersome. Bringing the machine for every visit after that will likely not be needed, but often helps for the first visit to troubleshoot if needed. Please re-enforce the importance of compliance with treatment and the need for [place] to monitor compliance data - often an insurance requirement and actually good feedback for the patient as far as how they are doing.  Also remind patient, that any interim PAP machine or mask issues should be first addressed with the DME company, as they can often help better with technical and mask fit issues. Please ask if patient has a preference regarding DME company.  Please also make sure, the patient has a follow-up appointment with me in about 10 weeks from the setup date, thanks.  Once you have spoken to the patient - and  faxed/routed report to PCP and referring MD (if other than PCP), you can close this encounter, thanks,   Star Age, MD, PhD [facility] ([place])

## 2017-03-11 NOTE — Procedures (Signed)
PATIENT'S NAME:  Justin Hill, [place] DOB:      1941-02-17      MR#:    [id number]     DATE OF RECORDING: 03/06/2017 REFERRING M.D.:  Cyndy Freeze, MD Study Performed:   CPAP  Titration HISTORY: 76 year old man with a complex medical history of type 2 diabetes, memory loss, congestive heart failure, cardiomyopathy, memory loss, PAF, atrial flutter, s/p defib placement in 7/17, hypertension, hypothyroidism, chronic kidney disease, allergic rhinitis, asthma, history of pulmonary hypertension, peripheral neuropathy, and obesity, who returns for a full night PAP titration study. His attempted SPLIT night titration performed on 02/19/2017 was inconclusive, as patient did not have enough sleep time on pressure to recommend an effective treatment pressure. Baseline AHI was 50.9/hour, REM AHI 47/hour, supine AHI 50.8/hour, O2 nadir of 82%. The patient's weight 237 pounds with a height of 71 (inches), resulting in a BMI of 33.3 kg/m2. The patient's neck circumference measured 17.8 inches.  CURRENT MEDICATIONS: Vitamin C, Imuran, Bilberry, Coreg, Lanoxin, Novolin, Multivitamin, Entresto, Zocor, Flomax, Demadex  PROCEDURE:  This is a multichannel digital polysomnogram utilizing the SomnoStar 11.2 system.  Electrodes and sensors were applied and monitored per AASM Specifications.   EEG, EOG, Chin and Limb EMG, were sampled at 200 Hz.  ECG, Snore and Nasal Pressure, Thermal Airflow, Respiratory Effort, CPAP Flow and Pressure, Oximetry was sampled at 50 Hz. Digital video and audio were recorded.      The patient was fitted with a medium Eson nasal mask which was later changed to a small Simplus FFM in an attempt to help with PAP tolerance. CPAP was initiated at 5 cmH20 with heated humidity per AASM standards and pressure was advanced to 10 cmH20 because of hypopneas, apneas and desaturations. But patient had significant difficulty maintaining sleep and was also tried on BiPAP of 10/5 cm for tolerance, but slept very  little.  At a PAP pressure of 7 cmH20, there was a reduction of the AHI to 22.7/hour, with 47.5 minutes of sleep.  Supine Non-REM sleep was achieved, O2 nadir of 91%.   Lights Out was at 00:03 and Lights On at 06:56. Total recording time (TRT) was 405.5 minutes, with a total sleep time (TST) of 87.5 minutes. The patient's sleep latency was normal and REM sleep was absent. The sleep efficiency was 21.6 %, which is poor.    SLEEP ARCHITECTURE: WASO (Wake after sleep onset)  was 294.5 minutes with moderate sleep fragmentation noted and very little sleep after 02:54 AM.  There were 23 minutes in Stage N1, 64.5 minutes Stage N2, 0 minutes Stage N3 and 0 minutes in Stage REM.  The percentage of Stage N1 was 26.3%, which is highly increased, Stage N2 was 73.7%, which is increased, Stage N3 and Stage R (REM sleep) were absent.  The arousals were noted as: 47 were spontaneous, 0 were associated with PLMs, 26 were associated with respiratory events.  Audio and video analysis did not show any abnormal or unusual movements, behaviors, phonations or vocalizations.  The patient took 1 bathroom break. The EKG showed occasional PVCs.    RESPIRATORY ANALYSIS:  There was a total of 49 respiratory events: 18 obstructive apneas, 0 central apneas and 0 mixed apneas with a total of 18 apneas and an apnea index (AI) of 12.3 /hour. There were 31 hypopneas with a hypopnea index of 21.3/hour. The patient also had 0 respiratory event related arousals (RERAs).      The total APNEA/HYPOPNEA INDEX  (AHI) was 33.6 /hour and the  total RESPIRATORY DISTURBANCE INDEX was 33.6 .hour  0 events occurred in REM sleep and 49 events in NREM. The REM AHI was 0 /hour versus a non-REM AHI of 33.6 /hour.  The patient spent 67.5 minutes of total sleep time in the supine position and 20 minutes in non-supine. The supine AHI was 36.4, versus a non-supine AHI of 24.0.  OXYGEN SATURATION & C02:  The baseline 02 saturation was 96%, with the lowest  being 86%. Time spent below 89% saturation equaled 0 minutes.  PERIODIC LIMB MOVEMENTS: The patient had a total of 0 Periodic Limb Movements.   DIAGNOSIS 1. Obstructive Sleep Apnea (OSA) 2. Nonspecific abnormal EKG    3. Dysfunctions associated with sleep stages or arousals from sleep   4. Repetitive Intrusions of Sleep    PLANS/RECOMMENDATIONS: 1. This patient has a history of severe obstructive sleep apnea but an optimal CPAP pressure was not determined during this study, due to poor sleep efficiency, absence of REM sleep and minimal sleep only after 02:54 AM. He slept the most on a CPAP pressure of 7 cm with supine NREM sleep achieved, O2 nadir of 91%, AHI at 22.7/hour. I will ask patient to start home CPAP therapy empirically on a pressure of 7 cm via medium nasal mask with heated humidity. The patient should be reminded to be fully compliant with PAP therapy to improve sleep related symptoms and decrease long term cardiovascular risks. The patient should be reminded, that it may take up to 3 months to get fully used to using PAP with all planned sleep. The earlier full compliance is achieved, the better long term compliance tends to be. Please note that untreated obstructive sleep apnea carries additional perioperative morbidity. Patients with significant obstructive sleep apnea should receive perioperative PAP therapy and the surgeons and particularly the anesthesiologist should be informed of the diagnosis and the severity of the sleep disordered breathing. 2. This study shows significant sleep fragmentation and abnormal sleep stage percentages; these are nonspecific findings and per se do not signify an intrinsic sleep disorder or a cause for the patient's sleep-related symptoms. Causes include (but are not limited to) the first night effect of the sleep study, circadian rhythm disturbances, medication effect or an underlying mood disorder or medical problem. 3. The study showed occasional PVCs  on single lead EKG; clinical correlation is recommended.  4. The patient should be cautioned not to drive, work at heights, or operate dangerous or heavy equipment when tired or sleepy. Review and reiteration of good sleep hygiene measures should be pursued with any patient. 5. The patient will be seen in follow-up by Dr. Rexene Alberts at [facility] for discussion of the test results and further management strategies. The referring provider will be notified of the test results.     I certify that I have reviewed the entire raw data recording prior to the issuance of this report in accordance with the Standards of Accreditation of the American Academy of Sleep Medicine (AASM)   Star Age, MD, PhD Diplomat, American Board of Psychiatry and Neurology (Neurology and Sleep Medicine)

## 2017-03-12 ENCOUNTER — Encounter: Payer: Medicare Other | Admitting: Nurse Practitioner

## 2017-03-12 ENCOUNTER — Encounter: Payer: Self-pay | Admitting: Nurse Practitioner

## 2017-03-16 ENCOUNTER — Telehealth: Payer: Self-pay

## 2017-03-16 NOTE — Telephone Encounter (Signed)
Patient called office returning RN's call.  Please call °

## 2017-03-16 NOTE — Telephone Encounter (Signed)
I called pt/pt's wife again to discuss pt's sleep study results. No answer, left a message asking them to call me back.

## 2017-03-16 NOTE — Telephone Encounter (Signed)
I called pt, spoke to pt's wife, Rosemarie Beath, (per DPR), who is in the care right now and asked me to call he back after 1pm.

## 2017-03-16 NOTE — Telephone Encounter (Signed)
Pt's wife returned RN's call °

## 2017-03-16 NOTE — Progress Notes (Signed)
Electrophysiology Office Note Date: 03/24/2017  ID:  Justin Hill, DOB 19-Oct-1941, MRN [id number]  PCP: Cyndy Freeze, MD Primary Cardiologist: Aundra Dubin Electrophysiologist: Allred  CC: Routine ICD follow-up  Justin Hill is a 76 y.o. male seen today for Dr Rayann Heman.  He presents today for routine electrophysiology followup.  He was hospitalized 01/2017 for lethargy felt to be 2/2 dehydration. Since last being seen in our clinic, the patient reports doing relatively well. He denies chest pain, palpitations, dyspnea, PND, orthopnea, nausea, vomiting, dizziness, syncope, edema, weight gain, or early satiety.  He has not had ICD shocks.   He has been seen by neurology and diagnosed with OSA. He is picking up CPAP today. His wife is also concerned about memory issues and he has appt with neurology for further evaluation.   Device History: MDT CRTD implanted 2017 for ICM,CHF History of appropriate therapy: No - inappropriate ATP therapy for AF with RVR History of AAD therapy: No   Past Medical History:  Diagnosis Date  . Altered mental status   . Cardiomyopathy   . CHF (congestive heart failure) ([place])   . Chronic kidney disease (CKD), stage III (moderate)   . Communicating hydrocephalus   . Diabetes mellitus   . Diverticulosis   . Hypercholesterolemia   . Hyperglycemia   . Hypertension   . Paroxysmal atrial fibrillation (HCC)   . Personal history of kidney stones    Past Surgical History:  Procedure Laterality Date  . ATRIAL FLUTTER ABLATION N/A 01/19/2015   Procedure: ATRIAL FLUTTER ABLATION;  Surgeon: [name], MD;  Location: [facility] CATH LAB;  Service: Cardiovascular;  Laterality: N/A;  . CARDIAC CATHETERIZATION N/A 05/02/2016   Procedure: Right/Left Heart Cath and Coronary Angiography;  Surgeon: Larey Dresser, MD;  Location: [facility] CV LAB;  Service: Cardiovascular;  Laterality: N/A;  . COLONOSCOPY N/A 12/29/2013   Procedure: COLONOSCOPY;  Surgeon: Lafayette Dragon,  MD;  Location: [facility] ENDOSCOPY;  Service: Endoscopy;  Laterality: N/A;  . EP IMPLANTABLE DEVICE N/A 06/05/2016   MDT Hillery Aldo XT CRTD implanted by Dr Rayann Heman   . [place]   left. Muscle reattachment  . TOE SURGERY Right 12/2012   2nd toe  . TONSILLECTOMY      Current Outpatient Prescriptions  Medication Sig Dispense Refill  . Ascorbic Acid (VITAMIN C) 500 MG CAPS Take 1 capsule by mouth 2 (two) times daily.     . Bilberry, Vaccinium myrtillus, (BILBERRY PO) Take by mouth.    . carvedilol (COREG) 25 MG tablet Take 0.5 tablets (12.5 mg total) by mouth 2 (two) times daily with a meal. 30 tablet 6  . digoxin (LANOXIN) 0.125 MG tablet TAKE ONE TABLET BY MOUTH ONCE DAILY 90 tablet 3  . insulin NPH-regular Human (NOVOLIN 70/30) (70-30) 100 UNIT/ML injection Inject 30 Units into the skin daily with supper. 20 mL 11  . Insulin Zinc Human (NOVOLIN L [place]) Inject 90 Units into the skin.    . Multiple Vitamin (MULTIVITAMIN) tablet Take 1 tablet by mouth daily.      . sacubitril-valsartan (ENTRESTO) 97-103 MG Take 1 tablet by mouth 2 (two) times daily. 60 tablet 3  . simvastatin (ZOCOR) 40 MG tablet Take 40 mg by mouth at bedtime.      . tamsulosin (FLOMAX) 0.4 MG CAPS capsule Take 0.4 mg by mouth daily.     Marland Kitchen torsemide (DEMADEX) 10 MG tablet Take 1 tablet (10 mg total) by mouth every other day. 30 tablet 3  .  torsemide (DEMADEX) 10 MG tablet TAKE ONE TABLET BY MOUTH ONCE DAILY 30 tablet 3  . XARELTO 20 MG TABS tablet TAKE ONE TABLET BY MOUTH ONCE DAILY WITH  SUPPER 90 tablet 3   No current facility-administered medications for this visit.     Allergies:   Aspirin; Lisinopril; and Penicillins   Social History: Social History   Social History  . Marital status: Married    Spouse name: Justin Hill  . Number of children: 1  . Years of education: 30   Occupational History  . retired    Social History Main Topics  . Smoking status: Former Smoker    Types: Cigarettes  . Smokeless  tobacco: Never Used  . Alcohol use No  . Drug use: No  . Sexual activity: Not on file   Other Topics Concern  . Not on file   Social History Narrative   Regular exercise-no   Rare caffeine use        Family History: Family History  Problem Relation Age of Onset  . Heart failure Maternal Grandmother   . Diabetes type II Sister 61  . Hyperlipidemia Other     Parent  . Stroke Other     Parent  . Hypertension Other     Parent  . Diabetes Other     Parent  . Colon cancer Neg Hx   . Heart attack Neg Hx     Review of Systems: All other systems reviewed and are otherwise negative except as noted above.   Physical Exam: VS:  BP 110/60   Pulse 98   Ht 5\' 11"  (1.803 m)   Wt 241 lb (109.3 kg)   SpO2 92%   BMI 33.61 kg/m  , BMI Body mass index is 33.61 kg/m.  GEN- The patient is elderly and obese appearing, alert and oriented x 3 today.   HEENT: normocephalic, atraumatic; sclera clear, conjunctiva pink; hearing intact; oropharynx clear; neck supple  Lungs- Clear to ausculation bilaterally, normal work of breathing.  No wheezes, rales, rhonchi Heart- Regular rate and rhythm (paced) GI- soft, non-tender, non-distended, bowel sounds present  Extremities- no clubbing, cyanosis, trace BLE edema MS- no significant deformity or atrophy Skin- warm and dry, no rash or lesion; ICD pocket well healed Psych- euthymic mood, full affect Neuro- strength and sensation are intact  ICD interrogation- reviewed in detail today,  See PACEART report  EKG:  EKG is ordered today. The ekg ordered today shows sinus rhythm with V pacing   Recent Labs: 04/30/2016: B Natriuretic Peptide 45.6 12/02/2016: Hemoglobin 13.5; Platelets 126 01/09/2017: BUN 15; Creatinine, Ser 1.42; Potassium 4.6; Sodium 136 02/05/2017: TSH 1.580   Wt Readings from Last 3 Encounters:  03/24/17 241 lb (109.3 kg)  02/12/17 237 lb (107.5 kg)  01/27/17 245 lb (111.1 kg)     Other studies Reviewed: Additional studies/  records that were reviewed today include: AHF notes, Dr Jackalyn Lombard notes, hospital records   Assessment and Plan:  1.  Chronic systolic dysfunction euvolemic today EF improved with CRT Stable on an appropriate medical regimen Normal ICD function See Pace Art report No changes today BMET today   2.  Paroxysmal atrial fibrillation Burden by device interrogation low V rates elevated. Increase Coreg to 25mg  twice daily today  Continue Xarelto for CHADS2VASC of 3  3. OSA Compliance with CPAP encouraged  Followed by neurology    Current medicines are reviewed at length with the patient today.   The patient does not have concerns  regarding his medicines.  The following changes were made today:  Increase Coreg to 25mg  twice daily   Labs/ tests ordered today include: BMET Orders Placed This Encounter  Procedures  . Basic Metabolic Panel (BMET)  . CUP PACEART [place]  . EKG 12-Lead     Disposition:   Follow up with ICM clinic, Dr Rayann Heman 6 months, AHF clinic 6 weeks      Signed, Chanetta Marshall, NP 03/24/2017 1:41 PM  [facility] [address] [place] [address] [phone] (office) [phone] (fax)

## 2017-03-16 NOTE — Telephone Encounter (Signed)
-----   Message from Star Age, MD sent at 03/11/2017  8:37 AM EDT ----- Patient referred by Dr. Cathi Roan, seen by me on 02/12/17, diagnostic PSG on 02/19/17 (attempted split study, but did not sleep on CPAP), CPAP study on 03/06/17. Please call and inform patient that I have entered an order for treatment with positive airway pressure (PAP) treatment of obstructive sleep apnea (OSA). He did not sleep very well again during this latest sleep study with CPAP and also tried BiPAP. Nevertheless, given his medical and cardiac Hx, I would like to try him on home CPAP of 7 cm (empirically). We will, therefore, arrange for a machine for home use through a DME (durable medical equipment) company of His choice; and I will see the patient back in follow-up in about 10 weeks. Please also explain to the patient that I will be looking out for compliance data, which can be downloaded from the machine (stored on an SD card, that is inserted in the machine) or via remote access through a modem, that is built into the machine. At the time of the followup appointment we will discuss sleep study results and how it is going with PAP treatment at home. Please advise patient to bring His machine at the time of the first FU visit, even though this is cumbersome. Bringing the machine for every visit after that will likely not be needed, but often helps for the first visit to troubleshoot if needed. Please re-enforce the importance of compliance with treatment and the need for [place] to monitor compliance data - often an insurance requirement and actually good feedback for the patient as far as how they are doing.  Also remind patient, that any interim PAP machine or mask issues should be first addressed with the DME company, as they can often help better with technical and mask fit issues. Please ask if patient has a preference regarding DME company.  Please also make sure, the patient has a follow-up appointment with me in about 10 weeks  from the setup date, thanks.  Once you have spoken to the patient - and faxed/routed report to PCP and referring MD (if other than PCP), you can close this encounter, thanks,   Star Age, MD, PhD [facility] ([place])

## 2017-03-16 NOTE — Telephone Encounter (Signed)
I called pt, spoke to pt's wife, Rosemarie Beath, per [facility] I advised pt's wife that Dr. Rexene Alberts reviewed pt's sleep study results and found that pt did not sleep very well again during the study with the cpap and bipap. Dr. Rexene Alberts recommends that pt start a cpap at home empirically, given pt's medical history. I reviewed PAP compliance expectations with the pt's wife. Pt's wife is agreeable to pt starting a CPAP. I advised pt's wife that an order will be sent to a DME, [facility], and [facility] will call the pt within about one week after they file with the pt's insurance. [facility] will show the pt how to use the machine, fit for masks, and troubleshoot the CPAP if needed. A follow up appt was made for insurance purposes with Dr. Rexene Alberts on July 5th, 2018 at 2:00pm. Pt's wife verbalized understanding to arrive 15 minutes early and bring their CPAP. A letter with all of this information in it will be mailed to the pt as a reminder. I verified with the pt's wife that the address we have on file is correct. Pt's wifeverbalized understanding of results. Pt's wife had no questions at this time but was encouraged to call back if questions arise.

## 2017-03-17 ENCOUNTER — Other Ambulatory Visit: Payer: Self-pay | Admitting: Cardiology

## 2017-03-18 ENCOUNTER — Ambulatory Visit: Payer: Medicare Other | Admitting: Podiatry

## 2017-03-23 DIAGNOSIS — L603 Nail dystrophy: Secondary | ICD-10-CM | POA: Diagnosis not present

## 2017-03-23 DIAGNOSIS — M2042 Other hammer toe(s) (acquired), left foot: Secondary | ICD-10-CM | POA: Diagnosis not present

## 2017-03-23 DIAGNOSIS — E1142 Type 2 diabetes mellitus with diabetic polyneuropathy: Secondary | ICD-10-CM | POA: Diagnosis not present

## 2017-03-23 DIAGNOSIS — L84 Corns and callosities: Secondary | ICD-10-CM | POA: Diagnosis not present

## 2017-03-23 DIAGNOSIS — M2041 Other hammer toe(s) (acquired), right foot: Secondary | ICD-10-CM | POA: Diagnosis not present

## 2017-03-24 ENCOUNTER — Ambulatory Visit (INDEPENDENT_AMBULATORY_CARE_PROVIDER_SITE_OTHER): Payer: Medicare Other | Admitting: Nurse Practitioner

## 2017-03-24 ENCOUNTER — Encounter: Payer: Self-pay | Admitting: Nurse Practitioner

## 2017-03-24 ENCOUNTER — Telehealth (HOSPITAL_COMMUNITY): Payer: Self-pay | Admitting: Vascular Surgery

## 2017-03-24 VITALS — BP 110/60 | HR 98 | Ht 71.0 in | Wt 241.0 lb

## 2017-03-24 DIAGNOSIS — I480 Paroxysmal atrial fibrillation: Secondary | ICD-10-CM

## 2017-03-24 DIAGNOSIS — G4733 Obstructive sleep apnea (adult) (pediatric): Secondary | ICD-10-CM

## 2017-03-24 DIAGNOSIS — Z9989 Dependence on other enabling machines and devices: Secondary | ICD-10-CM

## 2017-03-24 DIAGNOSIS — I5022 Chronic systolic (congestive) heart failure: Secondary | ICD-10-CM | POA: Diagnosis not present

## 2017-03-24 LAB — CUP PACEART INCLINIC DEVICE CHECK
Implantable Lead Implant Date: 20170713
Implantable Lead Implant Date: 20170713
Implantable Lead Location: 753859
Implantable Lead Location: 753860
Implantable Lead Model: 5076
MDC IDC LEAD IMPLANT DT: 20170713
MDC IDC LEAD LOCATION: 753858
MDC IDC PG IMPLANT DT: 20170713
MDC IDC SESS DTM: 20180501125504

## 2017-03-24 NOTE — Telephone Encounter (Signed)
Left pt message to make f/u appt w/ [facility]clean in July

## 2017-03-24 NOTE — Patient Instructions (Signed)
Medication Instructions: Your physician has recommended you make the following change in your medication: INCREASE COREG 25 mg TWICE A DAY   Labwork: Your physician recommends that you return for lab work today BMET  Procedures/Testing: None ordered   Follow-Up: You have been referred to Heart failure clinic  Your physician recommends that you schedule a follow-up appointment in: Dr. Aundra Dubin in July  Your physician recommends that you schedule a follow-up appointment in: 6 months with Dr. Rayann Heman  Any Additional Special Instructions Will Be Listed Below (If Applicable).     If you need a refill on your cardiac medications before your next appointment, please call your pharmacy.

## 2017-03-25 ENCOUNTER — Encounter: Payer: Self-pay | Admitting: Neurology

## 2017-03-25 ENCOUNTER — Ambulatory Visit (INDEPENDENT_AMBULATORY_CARE_PROVIDER_SITE_OTHER): Payer: Medicare Other | Admitting: Neurology

## 2017-03-25 VITALS — BP 110/48 | HR 80 | Resp 18 | Ht 71.0 in | Wt 240.0 lb

## 2017-03-25 DIAGNOSIS — G4733 Obstructive sleep apnea (adult) (pediatric): Secondary | ICD-10-CM | POA: Diagnosis not present

## 2017-03-25 DIAGNOSIS — R413 Other amnesia: Secondary | ICD-10-CM

## 2017-03-25 LAB — BASIC METABOLIC PANEL
BUN / CREAT RATIO: 17 (ref 10–24)
BUN: 25 mg/dL (ref 8–27)
CHLORIDE: 91 mmol/L — AB (ref 96–106)
CO2: 25 mmol/L (ref 18–29)
Calcium: 9.1 mg/dL (ref 8.6–10.2)
Creatinine, Ser: 1.47 mg/dL — ABNORMAL HIGH (ref 0.76–1.27)
GFR calc non Af Amer: 46 mL/min/{1.73_m2} — ABNORMAL LOW (ref >59)
GFR, EST AFRICAN AMERICAN: 54 mL/min/{1.73_m2} — AB (ref >59)
GLUCOSE: 383 mg/dL — AB (ref 65–99)
POTASSIUM: 5.2 mmol/L (ref 3.5–5.2)
Sodium: 133 mmol/L — ABNORMAL LOW (ref 134–144)

## 2017-03-25 NOTE — Patient Instructions (Addendum)
You have complaints of memory loss: memory loss or changes in cognitive function can have many reasons and does not always mean you have dementia. Conditions that can contribute to subjective or objective memory loss include: depression, stress, poor sleep from insomnia or sleep apnea, dehydration, fluctuation in blood sugar values, thyroid or electrolyte dysfunction and certain vitamin deficiencies. Dementia can be caused by stroke, brain atherosclerosis or brain vascular disease due to vascular risk factors (smoking, high blood pressure, high cholesterol, obesity and uncontrolled diabetes), certain degenerative brain disorders (including Parkinson's disease and Multiple sclerosis) and by Alzheimer's disease or other, more rare and sometimes hereditary causes. We may do some additional testing: we will consider a repeat CT head and a formal cognitive test called neuropsychological evaluation which is done by a licensed neuropsychologist. We will make a referral in that regard. We will not start medication as yet. We will have to await how you do on the CPAP, as you have severe sleep apnea.    We will see you back in July for sleep apnea.

## 2017-03-25 NOTE — Progress Notes (Signed)
Subjective:    Patient ID: Justin Hill is a 76 y.o. male.  HPI     Star Age, MD, PhD [facility] [address].O. [place], [place] [address]  Dear Dr. Cathi Roan,    I saw your patient, Justin Hill, upon your kind request in my neurologic clinic today for initial consultation of his memory loss. The patient is accompanied by his wife today. I have met him in March 2018 for sleep related issues and concern for sleep apnea. He has since then had 2 sleep studies. His baseline sleep study confirmed severe obstructive sleep apnea and then he came back for a full night CPAP titration study, but did not have optimal results, no optimal pressure found, no optimal pressure found during his attempted split night study before. We are going to start him on him. CPAP therapy at 7 cm.  As you know, Justin Hill is a 76 year old right-handed gentleman with an underlying complex medical history of poorly controlled type 2 diabetes, memory loss, congestive heart failure, cardiomyopathy, memory loss, PAF, atrial flutter, s/p defib placement in 7/17, hypertension, hypothyroidism, chronic kidney disease, allergic rhinitis, asthma, history of pulmonary hypertension, peripheral neuropathy, obesity, recent Dx of severe OSA, recent hospitalization in March 2018 for dehydration, acute on chronic kidney injury and obesity, who reports memory loss for the past month or so, however, his wife reports a more longer issue with memory, worse over the past 6 months. He has had some associated irritability, no anger outbursts, but he is verbally loudly to her at times. She does become tearful intermittently. They have been married for over 77 years and had dating several years before that, she reports that she knows him very well and some of his reports are heard full. He has had short-term memory issues, some confusion, still drives but mostly local and familiar roads and mostly  she is with him at the time. He does not have a family history of Alzheimer's disease. I reviewed your office note from 02/11/2017. Of note, he is not yet on CPAP therapy, received the machine yesterday.  Blood work from 02/11/2017 showed blood sugar level of 234, BUN of 40, creatinine of 1.62.  BMP on 03/24/17: showed glu 383, creatinine at 1.47, which is around his baseline and Na and Cl borderline low.   Previously (copied from previous notes for reference):   02/12/2017: He reports snoring and excessive daytime somnolence. I reviewed your office note from 02/03/2017 which you kindly included.   He has no AM HAs, has nocturia once per night. He used to snore when he was heavier per wife. He has started being more sleepy during the day in the past week or 2. He has been on Imuran for unclear reasons. Wife reports that he was on methotrexate but had side effects and was switched to Imuran. This is for a rash as I understand. He was recently also started on Namzaric but had significant side effects after 2 or 3 days including swelling of his legs and his wife stopped the medication after which the swelling improved.   He had a recent hospitalization on 02/04/17 to 02/05/2017 secondary to altered mental status. He was found to be dehydrated. CT head without contrast showed prominent ventricles in the context of atrophy. Some of his medications were discontinued. A1c was above 8, he had elevated glucose levels. He is creatinine level was elevated. TSH was normal, B12 and the lower end of normal. Lipid panel  was unremarkable.   His Epworth sleepiness score is 14 out of 24, fatigue score is 63 out of 63. He quit smoking over 40 years ago, does not drink alcohol and no daily caffeine. He is retired. He lives with his wife. They have one child.  His Past Medical History Is Significant For: Past Medical History:  Diagnosis Date  . Altered mental status   . Cardiomyopathy   . CHF (congestive heart failure)  ([place])   . Chronic kidney disease (CKD), stage III (moderate)   . Communicating hydrocephalus   . Diabetes mellitus   . Diverticulosis   . Hypercholesterolemia   . Hyperglycemia   . Hypertension   . Paroxysmal atrial fibrillation (HCC)   . Personal history of kidney stones     His Past Surgical History Is Significant For: Past Surgical History:  Procedure Laterality Date  . ATRIAL FLUTTER ABLATION N/A 01/19/2015   Procedure: ATRIAL FLUTTER ABLATION;  Surgeon: [name], MD;  Location: [facility] CATH LAB;  Service: Cardiovascular;  Laterality: N/A;  . CARDIAC CATHETERIZATION N/A 05/02/2016   Procedure: Right/Left Heart Cath and Coronary Angiography;  Surgeon: Larey Dresser, MD;  Location: [place] CV LAB;  Service: Cardiovascular;  Laterality: N/A;  . COLONOSCOPY N/A 12/29/2013   Procedure: COLONOSCOPY;  Surgeon: Lafayette Dragon, MD;  Location: [facility] ENDOSCOPY;  Service: Endoscopy;  Laterality: N/A;  . EP IMPLANTABLE DEVICE N/A 06/05/2016   MDT Hillery Aldo XT CRTD implanted by Dr Rayann Heman   . [place]   left. Muscle reattachment  . TOE SURGERY Right 12/2012   2nd toe  . TONSILLECTOMY      His Family History Is Significant For: Family History  Problem Relation Age of Onset  . Heart failure Maternal Grandmother   . Diabetes type II Sister 31  . Hyperlipidemia Other     Parent  . Stroke Other     Parent  . Hypertension Other     Parent  . Diabetes Other     Parent  . Colon cancer Neg Hx   . Heart attack Neg Hx     His Social History Is Significant For: Social History   Social History  . Marital status: Married    Spouse name: Rosemarie Beath  . Number of children: 1  . Years of education: 5   Occupational History  . retired    Social History Main Topics  . Smoking status: Former Smoker    Types: Cigarettes  . Smokeless tobacco: Never Used  . Alcohol use No  . Drug use: No  . Sexual activity: Not Asked   Other Topics Concern  . None   Social History Narrative    Regular exercise-no   Rare caffeine use        His Allergies Are:  Allergies  Allergen Reactions  . Aspirin Other (See Comments)    Burps, burning, stomach pains, etc  . Lisinopril Cough    Severe coughing  . Penicillins Other (See Comments)    syncope  :   His Current Medications Are:  Outpatient Encounter Prescriptions as of 03/25/2017  Medication Sig  . Ascorbic Acid (VITAMIN C) 500 MG CAPS Take 1 capsule by mouth 2 (two) times daily.   . Bilberry, Vaccinium myrtillus, (BILBERRY PO) Take by mouth.  . carvedilol (COREG) 25 MG tablet Take 25 mg by mouth 2 (two) times daily with a meal.  . digoxin (LANOXIN) 0.125 MG tablet TAKE ONE TABLET BY MOUTH ONCE DAILY  .  insulin NPH-regular Human (NOVOLIN 70/30) (70-30) 100 UNIT/ML injection Inject 30 Units into the skin daily with supper.  . Insulin Zinc Human (NOVOLIN L [place]) Inject 90 Units into the skin.  . Multiple Vitamin (MULTIVITAMIN) tablet Take 1 tablet by mouth daily.    . sacubitril-valsartan (ENTRESTO) 97-103 MG Take 1 tablet by mouth 2 (two) times daily.  . simvastatin (ZOCOR) 40 MG tablet Take 40 mg by mouth at bedtime.    . tamsulosin (FLOMAX) 0.4 MG CAPS capsule Take 0.4 mg by mouth daily.   Marland Kitchen torsemide (DEMADEX) 10 MG tablet Take 1 tablet (10 mg total) by mouth every other day.  Alveda Reasons 20 MG TABS tablet TAKE ONE TABLET BY MOUTH ONCE DAILY WITH  SUPPER  . [DISCONTINUED] carvedilol (COREG) 25 MG tablet Take 0.5 tablets (12.5 mg total) by mouth 2 (two) times daily with a meal.  . [DISCONTINUED] torsemide (DEMADEX) 10 MG tablet TAKE ONE TABLET BY MOUTH ONCE DAILY   No facility-administered encounter medications on file as of 03/25/2017.   : Review of Systems:  Out of a complete 14 point review of systems, all are reviewed and negative with the exception of these symptoms as listed below: Review of Systems  Neurological:       Wife states that that patient's memory loss has increased in the last 6 months. (Short term  memory)    Objective:  Neurologic Exam  Physical Exam Physical Examination:   Vitals:   03/25/17 0951  BP: (!) 110/48  Pulse: 80  Resp: 18   General Examination: The patient is a very pleasant 76 y.o. male in no acute distress. He appears well-developed and well-nourished and adequately groomed. He is rather quiet. He is able to follow commands. He is cooperative with the exam.  HEENT: Normocephalic, atraumatic, pupils are equal, round and reactive to light and accommodation. Extraocular tracking is mildly difficult for him. Hearing is grossly intact. Face is symmetric with normal facial animation and normal facial sensation. Speech is clear with no dysarthria noted. There is no hypophonia. There is no lip, neck/head, jaw or voice tremor. Neck is supple with full range of passive and active motion. There are no carotid bruits on auscultation. Oropharynx exam reveals: Mild to moderate mouth dryness, adequate dental hygiene and mild airway crowding, due to redundant soft palate. Mallampati is class II. Tongue protrudes centrally and palate elevates symmetrically. Tonsils are absent.   Chest: Clear to auscultation without wheezing, rhonchi or crackles noted.  Heart: S1+S2+0, regular and normal without murmurs, rubs or gallops noted.   Abdomen: Soft, non-tender and non-distended with normal bowel sounds appreciated on auscultation.  Extremities: There is no pitting edema in the distal lower extremities bilaterally.  Skin: Warm and considerably dry.   Musculoskeletal: exam reveals no obvious joint deformities, tenderness or joint swelling or erythema.   Neurologically:  Mental status: The patient is awake, alert and oriented in all 4 spheres. His immediate and remote memory, attention, language skills and fund of knowledge are impaired. Mood appears to be normal, affect is blunted.   On 03/25/2017: MMSE: 22/30, CDT: 3/4, AFT: 15/min.  Cranial nerves are under HEENT exam. Motor  exam reveals normal bulk, global strength of 4+ out of 5, Romberg is not testable safely, reflexes are 1+. Fine motor skills are mildly impaired globally, sensory exam is intact to light touch, pinprick, temperature and vibration in the upper extremities but decrease all modalities in the distal lower extremities bilaterally.  Cerebellar testing: No dysmetria or  intention tremor.  Gait, station and balance: He stands with difficulty. He walks slowly and cautiously, tandem walk is not possible. Balance is mildly impaired.    Assessment and Plan:   In summary, Justin Hill is a very pleasant 76 y.o.-year old male with an underlying complex medical history of poorly controlled type 2 diabetes, memory loss, congestive heart failure, cardiomyopathy, memory loss, PAF, atrial flutter, s/p defib placement in 7/17, hypertension, hypothyroidism, chronic kidney disease, allergic rhinitis, asthma, history of pulmonary hypertension, peripheral neuropathy, obesity, recent Dx of severe OSA, recent hospitalization in March 2018 for dehydration, acute on chronic kidney injury and obesity, who Presents for initial consultation of his memory loss of several months duration, worse in the past 6 months. His MMSE is 22 out of 30. Of note, he is at risk for vascular dementia, has multiple vascular risk factors including heart disease, hypertension, and severe obstructive sleep apnea for which he is going to start treatment. In addition, memory loss can be secondary to recent illness which includes acute on chronic kidney injury, recent hospitalization, abnormal sugar values, e-lyte disturbance. He had a head CT when he was in the hospital in March. We may repeat in the future, we will monitor his memory his memory scores. I may refer him to formal cognitive testing before we resort to putting him on dementia medication which is not without potential side effect risk. For now, I am hoping he will establish therapy for his  severe obstructive sleep apnea. Unfortunately, things are complicated and we will have to be aware of the multiple factors at play.   I had a long chat with the patient and his wife about my findings and the diagnosis of memory loss and dementia, its prognosis and treatment options. We talked about maintaining a healthy lifestyle in general and staying active mentally and physically. I encouraged the patient to eat healthy, exercise daily and keep well hydrated, to keep a scheduled bedtime and wake time routine, to not skip any meals and eat healthy snacks in between meals and to have protein with every meal. I stressed the importance of regular exercise, within of course the patient's own mobility limitations.   I encouraged the patient to keep up with current events by reading the news paper or watching the news and to do word puzzles, or if feasible, to go on [email or web address].   As far as further diagnostic testing is concerned, I suggested the following: we will consider repeat head CT without contrast in the near future and formal memory testing in the form of neuropsychological evaluation.  As far as medications are concerned, I recommended the following at this time: He is working on optimizing patient of his diabetes control. He also has borderline low sodium and chloride levels as of yesterday, creatinine and BUN appear to be at baseline for him. He does have chronic kidney disease. We may consider medication for memory loss and the near future.  He has an appointment with me in July for sleep apnea checkup and we will monitor his download and see how he does at the time.  I answered all their questions today and the patient and his wife were in agreement with the above outlined plan.   Thank you very much for allowing me to participate in the care of this nice patient. If I can be of any further assistance to you please do not hesitate to call me at [phone].  Sincerely,   Star Age, MD,  PhD  I spent 40 minutes in total face-to-face time with the patient, more than 50% of which was spent in counseling and coordination of care, reviewing test results, reviewing medication and discussing or reviewing the diagnosis of memory loss, OSA, and dementia, its prognosis and treatment options. Pertinent laboratory and imaging test results that were available during this visit with the patient were reviewed by me and considered in my medical decision making (see chart for details).

## 2017-03-30 ENCOUNTER — Encounter: Payer: Self-pay | Admitting: Endocrinology

## 2017-03-30 ENCOUNTER — Ambulatory Visit (INDEPENDENT_AMBULATORY_CARE_PROVIDER_SITE_OTHER): Payer: Medicare Other | Admitting: Endocrinology

## 2017-03-30 VITALS — BP 102/58 | HR 92 | Ht 71.0 in | Wt 242.0 lb

## 2017-03-30 DIAGNOSIS — E1122 Type 2 diabetes mellitus with diabetic chronic kidney disease: Secondary | ICD-10-CM | POA: Diagnosis not present

## 2017-03-30 DIAGNOSIS — N183 Chronic kidney disease, stage 3 unspecified: Secondary | ICD-10-CM

## 2017-03-30 DIAGNOSIS — Z794 Long term (current) use of insulin: Secondary | ICD-10-CM

## 2017-03-30 LAB — POCT GLYCOSYLATED HEMOGLOBIN (HGB A1C): HEMOGLOBIN A1C: 8.4

## 2017-03-30 MED ORDER — INSULIN NPH (HUMAN) (ISOPHANE) 100 UNIT/ML ~~LOC~~ SUSP: 90.0000 [IU] | SUBCUTANEOUS | 11 refills | Status: DC

## 2017-03-30 MED ORDER — INSULIN NPH ISOPHANE & REGULAR (70-30) 100 UNIT/ML ~~LOC~~ SUSP: 40.0000 [IU] | Freq: Every day | SUBCUTANEOUS | 11 refills | Status: DC

## 2017-03-30 NOTE — Patient Instructions (Addendum)
Please continue the same morning insulin: 90 units of NPH (50 if you are going to be active).   With supper, please increase "70/30" to 40 units.   On this type of insulin schedule, you should eat meals on a regular schedule.  If a meal is missed or significantly delayed, your blood sugar could go low.   The effects of the steroid shot will be gone in a few days.  Until then, take 10 extra units for any blood sugar over 200.  check your blood sugar twice a day.  vary the time of day when you check, between before the 3 meals, and at bedtime.  also check if you have symptoms of your blood sugar being too high or too low.  please keep a record of the readings and bring it to your next appointment here.  please call us sooner if your blood sugar goes below 70, or if you have a lot of readings over 200.   If you have any blood sugar under 80, please write on the paper why you think that was.   Please come back for a follow-up appointment in 2 months.

## 2017-03-30 NOTE — Progress Notes (Signed)
Subjective:    Patient ID: Justin Hill, male    DOB: [birth year], 76 y.o.   MRN: [id number]  HPI Pt returns for f/u of diabetes mellitus: DM type: Insulin-requiring type 2. Dx'ed: 0814 Complications: CHF, toe ulcer, partial toe amputation, polyneuropathy, and renal insufficiency.  Therapy: insulin since 2012.  DKA: never.   Severe hypoglycemia: never.    Pancreatitis: never.   Other: he takes human insulin for cost reasons; in October of 2014, he was changed from multiple daily injections to BID, due to persistently high a1c.  The pattern of cbg's indicates he needs NPH in AM and 70/30 with dinner.  Interval history: No recent steroids.  no cbg record, but states cbg's vary from 139-260.  It is highest at hs.  Wife says she does not miss any insulin injections.   Past Medical History:  Diagnosis Date  . Altered mental status   . Cardiomyopathy   . CHF (congestive heart failure) ([place])   . Chronic kidney disease (CKD), stage III (moderate)   . Communicating hydrocephalus   . Diabetes mellitus   . Diverticulosis   . Hypercholesterolemia   . Hyperglycemia   . Hypertension   . Paroxysmal atrial fibrillation (HCC)   . Personal history of kidney stones     Past Surgical History:  Procedure Laterality Date  . ATRIAL FLUTTER ABLATION N/A 01/19/2015   Procedure: ATRIAL FLUTTER ABLATION;  Surgeon: [name], MD;  Location: [facility] CATH LAB;  Service: Cardiovascular;  Laterality: N/A;  . CARDIAC CATHETERIZATION N/A 05/02/2016   Procedure: Right/Left Heart Cath and Coronary Angiography;  Surgeon: Larey Dresser, MD;  Location: [place] CV LAB;  Service: Cardiovascular;  Laterality: N/A;  . COLONOSCOPY N/A 12/29/2013   Procedure: COLONOSCOPY;  Surgeon: Lafayette Dragon, MD;  Location: [facility] ENDOSCOPY;  Service: Endoscopy;  Laterality: N/A;  . EP IMPLANTABLE DEVICE N/A 06/05/2016   MDT Hillery Aldo XT CRTD implanted by Dr Rayann Heman   . [place]   left. Muscle reattachment  . TOE  SURGERY Right 12/2012   2nd toe  . TONSILLECTOMY      Social History   Social History  . Marital status: Married    Spouse name: Rosemarie Beath  . Number of children: 1  . Years of education: 29   Occupational History  . retired    Social History Main Topics  . Smoking status: Former Smoker    Types: Cigarettes  . Smokeless tobacco: Never Used  . Alcohol use No  . Drug use: No  . Sexual activity: Not on file   Other Topics Concern  . Not on file   Social History Narrative   Regular exercise-no   Rare caffeine use        Current Outpatient Prescriptions on File Prior to Visit  Medication Sig Dispense Refill  . Ascorbic Acid (VITAMIN C) 500 MG CAPS Take 1 capsule by mouth 2 (two) times daily.     . Bilberry, Vaccinium myrtillus, (BILBERRY PO) Take by mouth.    . carvedilol (COREG) 25 MG tablet Take 25 mg by mouth 2 (two) times daily with a meal.    . digoxin (LANOXIN) 0.125 MG tablet TAKE ONE TABLET BY MOUTH ONCE DAILY 90 tablet 3  . Multiple Vitamin (MULTIVITAMIN) tablet Take 1 tablet by mouth daily.      . sacubitril-valsartan (ENTRESTO) 97-103 MG Take 1 tablet by mouth 2 (two) times daily. 60 tablet 3  . simvastatin (ZOCOR) 40 MG tablet Take  40 mg by mouth at bedtime.      . tamsulosin (FLOMAX) 0.4 MG CAPS capsule Take 0.4 mg by mouth daily.     Marland Kitchen torsemide (DEMADEX) 10 MG tablet Take 1 tablet (10 mg total) by mouth every other day. 30 tablet 3  . XARELTO 20 MG TABS tablet TAKE ONE TABLET BY MOUTH ONCE DAILY WITH  SUPPER 90 tablet 3   No current facility-administered medications on file prior to visit.     Allergies  Allergen Reactions  . Aspirin Other (See Comments)    Burps, burning, stomach pains, etc  . Lisinopril Cough    Severe coughing  . Penicillins Other (See Comments)    syncope    Family History  Problem Relation Age of Onset  . Heart failure Maternal Grandmother   . Diabetes type II Sister 55  . Hyperlipidemia Other     Parent  . Stroke Other       Parent  . Hypertension Other     Parent  . Diabetes Other     Parent  . Colon cancer Neg Hx   . Heart attack Neg Hx     BP (!) 102/58   Pulse 92   Ht 5\' 11"  (1.803 m)   Wt 242 lb (109.8 kg)   SpO2 93%   BMI 33.75 kg/m   Review of Systems He has lost 3 lbs, since last ov.      Objective:   Physical Exam VITAL SIGNS:  See vs page GENERAL: no distress EXTEMITIES: no deformity, except for hammer toes on the left foot. feet are of normal temp.  right 2nd toe is surgically absent.   SKIN: no ulcer.   CV: trace bilat leg edema. There is bilateral onychomycosis, rust-colored discoloration, and varicosities.   PULSES: dorsalis pedis intact bilat.  NEURO: sensation is intact to touch on the feet, but decreased from normal.   a1c=8.4%    Assessment & Plan:  Insulin-requiring type 2 DM, with polyneuropathy: Worse.  Noncompliance with cbg recording: this complicates the rx of DM Renal failure: this insulin schedule appears to best address his needs.    Patient Instructions  Please continue the same morning insulin: 90 units of NPH (50 if you are going to be active).   With supper, please increase "70/30" to 40 units.   On this type of insulin schedule, you should eat meals on a regular schedule.  If a meal is missed or significantly delayed, your blood sugar could go low.   The effects of the steroid shot will be gone in a few days.  Until then, take 10 extra units for any blood sugar over 200.  check your blood sugar twice a day.  vary the time of day when you check, between before the 3 meals, and at bedtime.  also check if you have symptoms of your blood sugar being too high or too low.  please keep a record of the readings and bring it to your next appointment here.  please call us sooner if your blood sugar goes below 70, or if you have a lot of readings over 200.   If you have any blood sugar under 80, please write on the paper why you think that was.   Please come back for  a follow-up appointment in 2 months.

## 2017-04-13 ENCOUNTER — Ambulatory Visit (INDEPENDENT_AMBULATORY_CARE_PROVIDER_SITE_OTHER): Payer: Medicare Other

## 2017-04-13 DIAGNOSIS — I5022 Chronic systolic (congestive) heart failure: Secondary | ICD-10-CM

## 2017-04-13 DIAGNOSIS — Z95810 Presence of automatic (implantable) cardiac defibrillator: Secondary | ICD-10-CM | POA: Diagnosis not present

## 2017-04-13 NOTE — Progress Notes (Signed)
EPIC Encounter for ICM Monitoring  Patient Name: Justin Hill is a 76 y.o. male Date: 04/13/2017 Primary Care Physican: Cyndy Freeze, MD Primary Cardiologist:[place] Electrophysiologist: Allred Dry Weight:unknown  Bi-V Pacing: 96.9%      Spoke with wife and patient.  Heart Failure questions reviewed, pt asymptomatic.  He is feeling fine.    Thoracic impedance normal.  Prescribed dosage: Torsemide 10 mg 1 tablet every other day.   Labs: 01/09/2017 Creatinine 1.42, BUN 15, Potassium 4.6, Sodium 136, EGFR 47-55 12/22/2016 Creatinine 1.56, BUN 22, Potassium 5.0, Sodium 138, EGFR 43-50 12/02/2016 Creatinine 1.20, BUN 14, Potassium 4.9, Sodium 138, EGFR >60 10/03/2016 Creatinine 1.27, BUN 16, Potassium 4.9, Sodium 135, EGFR 54->60  08/01/2016 Creatinine 1.21, BUN 16, Potassium 5.0, Sodium 138, EGFR 59-68  07/23/2016 Creatinine 1.52, BUN 19, Potassium 4.5, Sodium 136  05/29/2016 Creatinine 1.26, BUN 15, Potassium 4.6, Sodium 141  04/30/2016 Creatinine 1.17, BUN 9,   Potassium 4.9, Sodium 138  12/28/2015 Creatinine 1.35, BUN 13, Potassium 4.9, Sodium 142  11/30/2016Creatinine 1.37, BUN 20, Potassium 4.3, Sodium 139   Recommendations: No changes. Discussed to limit salt intake to 2000 mg/day and fluid intake to < 2 liters/day.  Encouraged to call for fluid symptoms or use local ER for any urgent symptoms.  Follow-up plan: ICM clinic phone appointment on 05/14/2017. Office appointment with Dr Aundra Dubin on 05/29/2017  Copy of ICM check sent to device physician.   3 month ICM trend: 04/13/2017              1 Year ICM trend:      Rosalene Billings, RN 04/13/2017 10:33 AM

## 2017-04-17 ENCOUNTER — Other Ambulatory Visit (HOSPITAL_COMMUNITY): Payer: Self-pay | Admitting: Cardiology

## 2017-04-22 ENCOUNTER — Encounter: Payer: Medicare Other | Admitting: Nurse Practitioner

## 2017-04-29 ENCOUNTER — Ambulatory Visit (INDEPENDENT_AMBULATORY_CARE_PROVIDER_SITE_OTHER): Payer: Medicare Other | Admitting: *Deleted

## 2017-04-29 ENCOUNTER — Telehealth: Payer: Self-pay

## 2017-04-29 DIAGNOSIS — R000 Tachycardia, unspecified: Secondary | ICD-10-CM

## 2017-04-29 DIAGNOSIS — L97522 Non-pressure chronic ulcer of other part of left foot with fat layer exposed: Secondary | ICD-10-CM | POA: Diagnosis not present

## 2017-04-29 DIAGNOSIS — I428 Other cardiomyopathies: Secondary | ICD-10-CM

## 2017-04-29 DIAGNOSIS — E1142 Type 2 diabetes mellitus with diabetic polyneuropathy: Secondary | ICD-10-CM | POA: Diagnosis not present

## 2017-04-29 DIAGNOSIS — I472 Ventricular tachycardia: Secondary | ICD-10-CM

## 2017-04-29 NOTE — Telephone Encounter (Signed)
Spoke with pts wife informed her that an alert had been received from his home monitor and his device needed to be checked in office, pts wife agreeable to apt at 3:00 today.

## 2017-04-29 NOTE — Progress Notes (Signed)
Pt seen d/t LV lead impedance alert >1000ohms. LV lead trend stable. LV threshold chronically elevated. Reprogrammed LV and RV impedance per protocol.

## 2017-05-14 ENCOUNTER — Telehealth: Payer: Self-pay

## 2017-05-14 ENCOUNTER — Ambulatory Visit (INDEPENDENT_AMBULATORY_CARE_PROVIDER_SITE_OTHER): Payer: Medicare Other

## 2017-05-14 DIAGNOSIS — I5022 Chronic systolic (congestive) heart failure: Secondary | ICD-10-CM | POA: Diagnosis not present

## 2017-05-14 DIAGNOSIS — Z95810 Presence of automatic (implantable) cardiac defibrillator: Secondary | ICD-10-CM | POA: Diagnosis not present

## 2017-05-14 NOTE — Telephone Encounter (Signed)
Remote ICM transmission received.  Attempted patient call and left message to return call.   

## 2017-05-14 NOTE — Progress Notes (Addendum)
Wife left voice mail message to return call.  Call back to wife.  She stated patient is doing well.  Transmission reviewed and no changes.  Encouraged to call for any fluid symptoms.  Next ICM remote 06/16/2017.

## 2017-05-14 NOTE — Progress Notes (Signed)
EPIC Encounter for ICM Monitoring  Patient Name: Justin Hill is a 76 y.o. male Date: 05/14/2017 Primary Care Physican: Cyndy Freeze, MD Primary Cardiologist:McLean Electrophysiologist: Allred Dry Weight:unknown Bi-V Pacing: 96.5%                                                 Attempted call to patient and wife and unable to reach.  Left message to return call.  Transmission reviewed.    Thoracic impedance normal.  Prescribed dosage: Torsemide 10 mg 1 tablet every other day.   Labs: 03/24/2017 Creatinine 1.47, BUN 25, Potassium 5.2, Sodium 133, EGFR 46-54 01/09/2017 Creatinine 1.42, BUN 15, Potassium 4.6, Sodium 136, EGFR 47-55 12/22/2016 Creatinine 1.56, BUN 22, Potassium 5.0, Sodium 138, EGFR 43-50 12/02/2016 Creatinine 1.20, BUN 14, Potassium 4.9, Sodium 138, EGFR >60 10/03/2016 Creatinine 1.27, BUN 16, Potassium 4.9, Sodium 135, EGFR 54->60  08/01/2016 Creatinine 1.21, BUN 16, Potassium 5.0, Sodium 138, EGFR 59-68  07/23/2016 Creatinine 1.52, BUN 19, Potassium 4.5, Sodium 136  05/29/2016 Creatinine 1.26, BUN 15, Potassium 4.6, Sodium 141  04/30/2016 Creatinine 1.17, BUN 9,   Potassium 4.9, Sodium 138  12/28/2015 Creatinine 1.35, BUN 13, Potassium 4.9, Sodium 142  11/30/2016Creatinine 1.37, BUN 20, Potassium 4.3, Sodium 139   Recommendations: NONE - Unable to reach patient   Follow-up plan: ICM clinic phone appointment on 06/16/2017.  Office appointment scheduled on 05/29/2017 with Dr. Aundra Dubin.  Copy of ICM check sent to device physician.   3 month ICM trend: 02/11/2017   1 Year ICM trend:      Rosalene Billings, RN 05/14/2017 3:00 PM

## 2017-05-20 DIAGNOSIS — L97521 Non-pressure chronic ulcer of other part of left foot limited to breakdown of skin: Secondary | ICD-10-CM | POA: Diagnosis not present

## 2017-05-20 DIAGNOSIS — E1142 Type 2 diabetes mellitus with diabetic polyneuropathy: Secondary | ICD-10-CM | POA: Diagnosis not present

## 2017-05-28 ENCOUNTER — Ambulatory Visit (INDEPENDENT_AMBULATORY_CARE_PROVIDER_SITE_OTHER): Payer: Medicare Other | Admitting: Neurology

## 2017-05-28 ENCOUNTER — Encounter: Payer: Self-pay | Admitting: Neurology

## 2017-05-28 VITALS — BP 120/65 | HR 77 | Ht 71.0 in | Wt 245.0 lb

## 2017-05-28 DIAGNOSIS — G4733 Obstructive sleep apnea (adult) (pediatric): Secondary | ICD-10-CM | POA: Diagnosis not present

## 2017-05-28 DIAGNOSIS — Z9989 Dependence on other enabling machines and devices: Secondary | ICD-10-CM | POA: Diagnosis not present

## 2017-05-28 DIAGNOSIS — R413 Other amnesia: Secondary | ICD-10-CM

## 2017-05-28 NOTE — Patient Instructions (Addendum)
Please continue using your CPAP regularly. While your insurance requires that you use CPAP at least 4 hours each night on 70% of the nights, I recommend, that you not skip any nights and use it throughout the night if you can. Getting used to CPAP and staying with the treatment long term does take time and patience and discipline. Untreated obstructive sleep apnea when it is moderate to severe can have an adverse impact on cardiovascular health and raise her risk for heart disease, arrhythmias, hypertension, congestive heart failure, stroke and diabetes. Untreated obstructive sleep apnea causes sleep disruption, nonrestorative sleep, and sleep deprivation. This can have an impact on your day to day functioning and cause daytime sleepiness and impairment of cognitive function, memory loss, mood disturbance, and problems focussing. Using CPAP regularly can improve these symptoms.  We will recheck memory and CPAP compliance in about 4 month, you can see either Jinny Blossom or Hoyle Sauer. We will increase your pressure to 8 cm at this time.

## 2017-05-28 NOTE — Progress Notes (Signed)
Subjective:    Patient ID: Justin Hill is a 76 y.o. male.  HPI     Interim history:   Justin Hill is a 76 year old right-handed gentleman with an underlying complex medical history of poorly controlled type 2 diabetes, memory loss, congestive heart failure, cardiomyopathy, memory loss, PAF, atrial flutter, s/p defib placement in 7/17, hypertension, hypothyroidism, chronic kidney disease, allergic rhinitis, asthma, history of pulmonary hypertension, peripheral neuropathy, obesity, recent Dx of severe OSA, recent hospitalization in March 2018 for dehydration, acute on chronic kidney injury and obesity, who presents for follow-up consultation of his obstructive sleep apnea and memory loss. The patient is accompanied by his wife again today. I last saw him on 03/25/2017, at which time he was re-referred for memory loss. He had not yet started his CPAP treatment for his severe obstructive sleep apnea. I suggested we monitor his memory scores. His MMSE was 22 out of 30 at the time. He was explained that he was at risk for vascular dementia. He was advised that memory loss can be seen in the context of untreated sleep apnea. In addition, memory loss contributor's include acute on chronic kidney injury, recent hospitalization, abnormal sugar values, electrolyte disturbance and I suggested that we hold off on any other medication trials. He had side effects on Namzaric.  I did consider referring him to neuropsychological evaluation after he establishes CPAP therapy.  He had a split-night sleep study on 02/19/2017 and then returned for a full night CPAP titration study on 03/06/2017. I went over his test results with him in detail today. His split-night sleep study from 02/19/2017 showed a sleep efficiency at baseline of 75.8%, sleep latency was 20 minutes, REM latency was 107.5 minutes. He had an overall AHI of 50.9 per hour, average oxygen saturation of 92%, nadir was 82%. He had no significant PLMS at  baseline. He was fitted with a full face mask. CPAP was titrated from 5 cm to 8 cm. He did not achieve significant amount of sleep post CPAP initiation and an optimal pressure was therefore not obtained. EKG showed occasional PVCs. He return for a full night CPAP titration study on 03/06/2017. Sleep efficiency was very low at 21.6%, REM sleep was absent. Slow-wave sleep was absent as well, he had an increased percentage of stage I and stage II sleep. He was tried on a nasal mask and then switched to a full facemask. CPAP was titrated from 5 cm to 10 cm. He was having difficulty maintaining sleep, was tried on BiPAP of 10/5 cm for better tolerance but did not achieve any significant amount of sleep. I suggested we start him on him. CPAP therapy at home at a pressure of 7 cm.  Today, 05/28/2017 (all dictated new, as well as above notes, some dictation done in note pad or Word, outside of chart, may appear as copied):  I reviewed his CPAP compliance data from 04/28/2017 through 05/27/2017, which is a total of 30 days, during which time he used his machine every night with percent used days greater than 4 hours at 97%, indicating excellent compliance with an average usage of 9 hours and 23 minutes, residual AHI suboptimal at 8.7 per hour, leak on the high side with the 95th percentile at 23.6 L/m on a pressure of 7 cm with EPR of 3. He reports doing okay, sleeps better, still some nocturia, but better per wife. He is using a nasal pillows interface, did not do well with the FFM, per wife. Memory stable,  maybe initially after CPAP start, but plateaued. Blood sugar still high, in 200s for fasting at home. Has appt with Dr. Loanne Drilling and sees the cardiologist too next week. Some mornings he is slow to rise, grumpy, some are better. Going on a cruise soon.    The patient's allergies, current medications, family history, past medical history, past social history, past surgical history and problem list were reviewed and  updated as appropriate.   Previously (copied from previous notes for reference):   03/25/17: He reports memory loss for the past month or so, however, his wife reports a more longer issue with memory, worse over the past 6 months. He has had some associated irritability, no anger outbursts, but he is verbally loudly to her at times. She does become tearful intermittently. They have been married for over 64 years and had dating several years before that, she reports that she knows him very well and some of his reports are heard full. He has had short-term memory issues, some confusion, still drives but mostly local and familiar roads and mostly she is with him at the time. He does not have a family history of Alzheimer's disease. I reviewed your office note from 02/11/2017. Of note, he is not yet on CPAP therapy, received the machine yesterday.   Blood work from 02/11/2017 showed blood sugar level of 234, BUN of 40, creatinine of 1.62.  BMP on 03/24/17: showed glu 383, creatinine at 1.47, which is around his baseline and Na and Cl borderline low.    02/12/2017: He reports snoring and excessive daytime somnolence. I reviewed your office note from 02/03/2017 which you kindly included.   He has no AM HAs, has nocturia once per night. He used to snore when he was heavier per wife. He has started being more sleepy during the day in the past week or 2. He has been on Imuran for unclear reasons. Wife reports that he was on methotrexate but had side effects and was switched to Imuran. This is for a rash as I understand. He was recently also started on Namzaric but had significant side effects after 2 or 3 days including swelling of his legs and his wife stopped the medication after which the swelling improved.   He had a recent hospitalization on 02/04/17 to 02/05/2017 secondary to altered mental status. He was found to be dehydrated. CT head without contrast showed prominent ventricles in the context of atrophy.  Some of his medications were discontinued. A1c was above 8, he had elevated glucose levels. He is creatinine level was elevated. TSH was normal, B12 and the lower end of normal. Lipid panel was unremarkable.   His Epworth sleepiness score is 14 out of 24, fatigue score is 63 out of 63. He quit smoking over 40 years ago, does not drink alcohol and no daily caffeine. He is retired. He lives with his wife. They have one child.  His Past Medical History Is Significant For: Past Medical History:  Diagnosis Date  . Altered mental status   . Cardiomyopathy   . CHF (congestive heart failure) ([place])   . Chronic kidney disease (CKD), stage III (moderate)   . Communicating hydrocephalus   . Diabetes mellitus   . Diverticulosis   . Hypercholesterolemia   . Hyperglycemia   . Hypertension   . Paroxysmal atrial fibrillation ([facility])   . Personal history of kidney stones     His Past Surgical History Is Significant For: Past Surgical History:  Procedure Laterality Date  .  ATRIAL FLUTTER ABLATION N/A 01/19/2015   Procedure: ATRIAL FLUTTER ABLATION;  Surgeon: [name], MD;  Location: [facility] CATH LAB;  Service: Cardiovascular;  Laterality: N/A;  . CARDIAC CATHETERIZATION N/A 05/02/2016   Procedure: Right/Left Heart Cath and Coronary Angiography;  Surgeon: Larey Dresser, MD;  Location: [place] CV LAB;  Service: Cardiovascular;  Laterality: N/A;  . COLONOSCOPY N/A 12/29/2013   Procedure: COLONOSCOPY;  Surgeon: Lafayette Dragon, MD;  Location: [facility] ENDOSCOPY;  Service: Endoscopy;  Laterality: N/A;  . EP IMPLANTABLE DEVICE N/A 06/05/2016   MDT Hillery Aldo XT CRTD implanted by Dr Rayann Heman   . [place]   left. Muscle reattachment  . TOE SURGERY Right 12/2012   2nd toe  . TONSILLECTOMY      His Family History Is Significant For: Family History  Problem Relation Age of Onset  . Heart failure Maternal Grandmother   . Diabetes type II Sister 49  . Hyperlipidemia Other        Parent  . Stroke Other         Parent  . Hypertension Other        Parent  . Diabetes Other        Parent  . Colon cancer Neg Hx   . Heart attack Neg Hx     His Social History Is Significant For: Social History   Social History  . Marital status: Married    Spouse name: Rosemarie Beath  . Number of children: 1  . Years of education: 48   Occupational History  . retired    Social History Main Topics  . Smoking status: Former Smoker    Types: Cigarettes  . Smokeless tobacco: Never Used  . Alcohol use No  . Drug use: No  . Sexual activity: Not Asked   Other Topics Concern  . None   Social History Narrative   Regular exercise-no   Rare caffeine use        His Allergies Are:  Allergies  Allergen Reactions  . Aspirin Other (See Comments)    Burps, burning, stomach pains, etc  . Lisinopril Cough    Severe coughing  . Penicillins Other (See Comments)    syncope  :   His Current Medications Are:  Outpatient Encounter Prescriptions as of 05/28/2017  Medication Sig  . Ascorbic Acid (VITAMIN C) 500 MG CAPS Take 1 capsule by mouth 2 (two) times daily.   . Bilberry, Vaccinium myrtillus, (BILBERRY PO) Take by mouth.  . carvedilol (COREG) 25 MG tablet Take 25 mg by mouth 2 (two) times daily with a meal.  . digoxin (LANOXIN) 0.125 MG tablet TAKE ONE TABLET BY MOUTH ONCE DAILY  . ENTRESTO 97-103 MG TAKE ONE TABLET BY MOUTH TWICE DAILY  . insulin NPH Human (NOVOLIN N) 100 UNIT/ML injection Inject 0.9 mLs (90 Units total) into the skin every morning.  . insulin NPH-regular Human (NOVOLIN 70/30) (70-30) 100 UNIT/ML injection Inject 40 Units into the skin daily with supper.  . Multiple Vitamin (MULTIVITAMIN) tablet Take 1 tablet by mouth daily.    . simvastatin (ZOCOR) 40 MG tablet Take 40 mg by mouth at bedtime.    . tamsulosin (FLOMAX) 0.4 MG CAPS capsule Take 0.4 mg by mouth daily.   Marland Kitchen torsemide (DEMADEX) 10 MG tablet Take 1 tablet (10 mg total) by mouth every other day.  Alveda Reasons 20 MG TABS tablet TAKE  ONE TABLET BY MOUTH ONCE DAILY WITH  SUPPER   No facility-administered encounter medications  on file as of 05/28/2017.   :  Review of Systems:  Out of a complete 14 point review of systems, all are reviewed and negative with the exception of these symptoms as listed below: Review of Systems  Neurological:       Pt presents today to discuss his cpap compliance. Pt strives to use his cpap every night, and reports that it is going well.    Objective:  Neurological Exam  Physical Exam Physical Examination:   Vitals:   05/28/17 1346  BP: 120/65  Pulse: 77   General Examination: The patient is a very pleasant 76 y.o. male in no acute distress. He appears in better spirits, more alert, but minimally verbal.   HEENT:Normocephalic, atraumatic, pupils are equal, round and reactive to light and accommodation. Extraocular tracking is mildly difficult for him. Hearing is grossly intact. Face is symmetric with normal facial animation and normal facial sensation. Speech is clear with no dysarthria noted, short sentences. There is no hypophonia. There is no lip, neck/head, jaw or voice tremor. Neck is supple with full range of passive and active motion. There are no carotid bruits on auscultation. Oropharynx exam reveals: Mild to moderate mouth dryness, adequatedental hygiene and mildairway crowding. Mallampati is class II. Tongue protrudes centrally and palate elevates symmetrically. Tonsils are absent.   Chest:Clear to auscultation without wheezing, rhonchi or crackles noted.  Heart:S1+S2+0, regular and normal without murmurs, rubs or gallops noted.   Abdomen:Soft, non-tender and non-distended with normal bowel sounds appreciated on auscultation.  Extremities:There is no pitting edema in the distal lower extremities bilaterally.  Skin: Warm and considerably dry.   Musculoskeletal: exam reveals no obvious joint deformities, tenderness or joint swelling or erythema.    Neurologically:  Mental status: The patient is awake, alert and oriented in all 4 spheres. Hisimmediate and remote memory, attention, language skills and fund of knowledge are impaired. Mood appears to be normal, affect is blunted.   On 03/25/2017: MMSE: 22/30, CDT: 3/4, AFT: 15/min.  Cranial nerves are under HEENT exam. Motor exam reveals normal bulk, global strength of 4+ out of 5, Romberg is not testable safely, reflexes are 1+, absent in the ankles. Fine motor skills are mildly impaired globally, sensory exam is intact to light touch.  Gait, station and balance: Hestands with difficulty and pushes himself up. He walks slowly and cautiously, tandem walk is not possible. Balance is mildly impaired.    Assessment and Plan:   In summary, DAICHI MORIS is a very pleasant 76 year old male with an underlying complex medical history of poorly controlled type 2 diabetes, memory loss, congestive heart failure, cardiomyopathy, PAF/atrial flutter, s/p defib placement in 7/17, hypertension, hypothyroidism, chronic kidney disease, allergic rhinitis, asthma, history of pulmonary hypertension, peripheral neuropathy, obesity, recent Dx of severe OSA, hospitalization in March 2018 for dehydration, acute on chronic kidney injury and obesity, who presents for follow-up consultation of his severe obstructive sleep apnea. He has established treatment with CPAP therapy and is compliant with treatment for which he is commended. He reports improvement in his sleep quality but most history is provided by his wife who reports that he seems to sleep better throughout the night, less sleep disruption, some improvement noted generally speaking and perhaps even in his memory in the beginning when he first started treatment but memory loss is still apparent. She is advised that his situation is complicated. We will continue to monitor his memory score. His MMSE was 22 out of 30 in May 2018. He  has multiple vascular risk  factors and is indeed at risk for vascular dementia, unfortunately had side effects with Namzaric, which was prescribed by PCP.  He had a head CT when he was in the hospital in March 2018. We may repeat in the future, we may also consider a formal cognitive evaluation before we resort to putting him on dementia medication.   I had a long chat with the patient and his wife about my findings and the diagnosis of memory loss and dementia, its prognosis and treatment options. We talked about maintaining a healthy lifestyle in general and staying active mentally and physically. He is advised to stay well hydrated with water. Especially, in light of recent admission when he was found to be dehydrated and had acute on chronic kidney impairment. He is advised to be physically active as best as possible. He is encouraged to continue with CPAP with full compliance. For his mild residual sleep disordered breathing I suggested we increase his CPAP pressure to 8 cm. He was not able to tolerate the full facemask and is currently using what sounds like nasal pillows. We will do a four-month checkup with one of our nurse practitioners at which time we can recheck his MMSE as well for comparison.  I answered all their questions today and the patient and his wife were in agreement with the above outlined plan.  I spent 30 minutes in total face-to-face time with the patient, more than 50% of which was spent in counseling and coordination of care, reviewing test results, reviewing medication and discussing or reviewing the diagnosis of OSA and memory loss, the prognosis and treatment options. Pertinent laboratory and imaging test results that were available during this visit with the patient were reviewed by me and considered in my medical decision making (see chart for details).

## 2017-05-29 ENCOUNTER — Encounter: Payer: Self-pay | Admitting: Cardiology

## 2017-05-29 ENCOUNTER — Encounter (HOSPITAL_COMMUNITY): Payer: Self-pay | Admitting: Cardiology

## 2017-05-29 ENCOUNTER — Ambulatory Visit (HOSPITAL_COMMUNITY)
Admission: RE | Admit: 2017-05-29 | Discharge: 2017-05-29 | Disposition: A | Discharge status: Home / Self Care | Payer: Medicare Other | Source: Ambulatory Visit | Attending: Cardiology | Admitting: Cardiology

## 2017-05-29 VITALS — BP 104/54 | HR 88 | Wt 250.5 lb

## 2017-05-29 DIAGNOSIS — I480 Paroxysmal atrial fibrillation: Secondary | ICD-10-CM | POA: Diagnosis not present

## 2017-05-29 DIAGNOSIS — Z794 Long term (current) use of insulin: Secondary | ICD-10-CM | POA: Diagnosis not present

## 2017-05-29 DIAGNOSIS — I130 Hypertensive heart and chronic kidney disease with heart failure and stage 1 through stage 4 chronic kidney disease, or unspecified chronic kidney disease: Secondary | ICD-10-CM | POA: Insufficient documentation

## 2017-05-29 DIAGNOSIS — N189 Chronic kidney disease, unspecified: Secondary | ICD-10-CM | POA: Diagnosis not present

## 2017-05-29 DIAGNOSIS — E785 Hyperlipidemia, unspecified: Secondary | ICD-10-CM | POA: Insufficient documentation

## 2017-05-29 DIAGNOSIS — I4891 Unspecified atrial fibrillation: Secondary | ICD-10-CM | POA: Insufficient documentation

## 2017-05-29 DIAGNOSIS — Z7901 Long term (current) use of anticoagulants: Secondary | ICD-10-CM | POA: Diagnosis not present

## 2017-05-29 DIAGNOSIS — I2510 Atherosclerotic heart disease of native coronary artery without angina pectoris: Secondary | ICD-10-CM | POA: Diagnosis not present

## 2017-05-29 DIAGNOSIS — I429 Cardiomyopathy, unspecified: Secondary | ICD-10-CM | POA: Diagnosis not present

## 2017-05-29 DIAGNOSIS — E669 Obesity, unspecified: Secondary | ICD-10-CM | POA: Diagnosis not present

## 2017-05-29 DIAGNOSIS — I5022 Chronic systolic (congestive) heart failure: Secondary | ICD-10-CM | POA: Diagnosis not present

## 2017-05-29 DIAGNOSIS — I447 Left bundle-branch block, unspecified: Secondary | ICD-10-CM | POA: Diagnosis not present

## 2017-05-29 DIAGNOSIS — I4892 Unspecified atrial flutter: Secondary | ICD-10-CM | POA: Diagnosis not present

## 2017-05-29 DIAGNOSIS — Z87891 Personal history of nicotine dependence: Secondary | ICD-10-CM | POA: Insufficient documentation

## 2017-05-29 DIAGNOSIS — E1122 Type 2 diabetes mellitus with diabetic chronic kidney disease: Secondary | ICD-10-CM | POA: Insufficient documentation

## 2017-05-29 LAB — BASIC METABOLIC PANEL
Anion gap: 9 (ref 5–15)
BUN: 13 mg/dL (ref 6–20)
CALCIUM: 8.8 mg/dL — AB (ref 8.9–10.3)
CO2: 26 mmol/L (ref 22–32)
CREATININE: 1.32 mg/dL — AB (ref 0.61–1.24)
Chloride: 99 mmol/L — ABNORMAL LOW (ref 101–111)
GFR calc non Af Amer: 51 mL/min — ABNORMAL LOW (ref >60)
GFR, EST AFRICAN AMERICAN: 60 mL/min — AB (ref >60)
Glucose, Bld: 314 mg/dL — ABNORMAL HIGH (ref 65–99)
Potassium: 4.7 mmol/L (ref 3.5–5.1)
SODIUM: 134 mmol/L — AB (ref 135–145)

## 2017-05-29 LAB — CBC
HCT: 39.1 % (ref 39.0–52.0)
HEMOGLOBIN: 12.8 g/dL — AB (ref 13.0–17.0)
MCH: 29.3 pg (ref 26.0–34.0)
MCHC: 32.7 g/dL (ref 30.0–36.0)
MCV: 89.5 fL (ref 78.0–100.0)
Platelets: 122 10*3/uL — ABNORMAL LOW (ref 150–400)
RBC: 4.37 MIL/uL (ref 4.22–5.81)
RDW: 14.6 % (ref 11.5–15.5)
WBC: 6.5 10*3/uL (ref 4.0–10.5)

## 2017-05-29 LAB — LIPID PANEL
CHOL/HDL RATIO: 3.6 ratio
Cholesterol: 137 mg/dL (ref 0–200)
HDL: 38 mg/dL — ABNORMAL LOW (ref >40)
LDL Cholesterol: 26 mg/dL (ref 0–99)
Triglycerides: 363 mg/dL — ABNORMAL HIGH (ref <150)
VLDL: 73 mg/dL — AB (ref 0–40)

## 2017-05-29 LAB — DIGOXIN LEVEL: Digoxin Level: 0.3 ng/mL — ABNORMAL LOW (ref 0.8–2.0)

## 2017-05-29 NOTE — Patient Instructions (Signed)
Routine lab work today. Will notify you of abnormal results, otherwise no news is good news!  No changes to medication at this time.  Follow up with Dr. McLean in 3 months. We will call you closer to this time, or you may call our office to schedule 1 month before you are due to be seen. Take all medication as prescribed the day of your appointment. Bring all medications with you to your appointment.  Do the following things EVERYDAY: 1) Weigh yourself in the morning before breakfast. Write it down and keep it in a log. 2) Take your medicines as prescribed 3) Eat low salt foods-Limit salt (sodium) to 2000 mg per day.  4) Stay as active as you can everyday 5) Limit all fluids for the day to less than 2 liters   

## 2017-05-31 NOTE — Progress Notes (Signed)
Patient ID: Justin Hill, male   DOB: June 25, 1941, 76 y.o.   MRN: [id number] PCP: Dr. Camillia Herter Cardiology: Dr. Aundra Dubin  76 yo with history CKD, HTN, diabetes, chronic LBBB, and nonischemic cardiomyopathy presents for cardiology followup.  His cardiomyopathy has been known for years.  He had a LHC in 2008 showing mild nonobstructive CAD.     Justin Hill was admitted in 2/16 with atrial flutter degenerating into atrial fibrillation.  He was initially cardioverted, then atrial flutter recurred.  He had atrial flutter ablation by Dr Rayann Heman in 2/16.  He thinks that he has been in NSR since that time, no further palpitations. He is on Xarelto now with no melena or BRBPR.    He is s/p placement of Medtronic CRT-D device.  Echo in 1/18 showed improvement in EF to 40-45%.    He has been stable in terms of cardiac symptoms. No dyspnea with his normal activities.  No chest pain.  No lightheadedness, no orthopnea/PND.  He is in NSR today, has felt no palpitations.  No BRBPR/melena.   [name].  He has been to see a neurologist and cognitive testing is planned.   Optivol (personally reviewed): fluid index < threshold, stable impedance, no VT.  He has rare short runs of atrial fibrillation.     Labs (5/14): K 4, creatinine 1.4, LFTs normal, LDL 46, HDL 37 Labs (1/15): K 4.4, creatinine 1.46, LDL 74, HDL 35 Labs (4/15); LDL 96, HDL 44, K 5.2, creatinine 1.4 Labs (1/16): K 4.9, creatinine 1.36 Labs (2/16): K 4.5, creatinine 1.56, BNP 412 Labs (9/16): K 4.8, creatinine 1.33, hgb 14.7 Labs (11/16): K 4.3, creatinine 1.37, BNP 52 Labs (1/17): K 4.5 => 5, creatinine 1.35 => 1.17, HCT 47.4 Labs (2/17): K 4.9, creatinine 1.35 Labs (5/17): LDL 48, LFTs normal Labs (9/17): K 5, creatinine 1.21 Labs (11/17): HCT 41.4, digoxin 0.3, K 4.9, creatinine 1.27 Labs (5/18): K 5.2, creatinine 1.47  PMH: 1. Type II diabetes with peripheral neuropathy.  2. CKD 3.  Hyperlipidemia 4. HTN: ACEI cough.  5. Nonischemic cardiomyopathy: This was diagnosed prior to 2008.  In 2008, patient had LHC with only mild nonobstructive coronary disease.  Echo in 1/14 showed EF 25-30% with normal RV.  Patient has not tolerated ACEI due to hyperkalemia and AKI. ICD was discussed (Dr Lovena Le) and decided against given NYHA class I symptoms.  Echo (7/15) with EF 25-30%, septal-lateral dyssynchrony, mild LV dilation. Echo (6/16) with EF 25-30%, mild LVH, septal-lateral dyssynchrony, normal RV size and systolic function. CPX (12/16) with peak VO2 14.5 (69% predicted), VE/VCO2 34, RER 1.19 => mild functional impairment from heart failure, restrictive lung function likely due to body habitus.  - LHC/RHC (6/17): 30% mid LAD stenosis; mean RA 5, PA 33/10, PCWP mean 16, CI 1.95.  - Medtronic CRT-D placed 7/17.  - Echo (1/18): EF 40-45%, mild LVH.  6. Chronic LBBB 7. Obesity 8. Atrial flutter and atrial fibrillation: S/p atrial flutter ablation in 2/16 (Justin Hill).  9. Memory difficulty.  SH: Lives in [place], married, prior smoker, no ETOH.  1 child.   FH: Grandmother with CHF.  Mother with CVA.   ROS: All systems reviewed and negative except as per HPI.    Current Outpatient Prescriptions  Medication Sig Dispense Refill  . Ascorbic Acid (VITAMIN C) 500 MG CAPS Take 1 capsule by mouth 2 (two) times daily.     . Bilberry, Vaccinium myrtillus, (BILBERRY PO) Take by mouth.    Marland Kitchen  carvedilol (COREG) 25 MG tablet Take 25 mg by mouth 2 (two) times daily with a meal.    . digoxin (LANOXIN) 0.125 MG tablet TAKE ONE TABLET BY MOUTH ONCE DAILY 90 tablet 3  . ENTRESTO 97-103 MG TAKE ONE TABLET BY MOUTH TWICE DAILY 180 tablet 3  . insulin NPH Human (NOVOLIN N) 100 UNIT/ML injection Inject 0.9 mLs (90 Units total) into the skin every morning. 30 mL 11  . insulin NPH-regular Human (NOVOLIN 70/30) (70-30) 100 UNIT/ML injection Inject 40 Units into the skin daily with supper. 20 mL 11  . Multiple  Vitamin (MULTIVITAMIN) tablet Take 1 tablet by mouth daily.      . simvastatin (ZOCOR) 40 MG tablet Take 40 mg by mouth at bedtime.      . tamsulosin (FLOMAX) 0.4 MG CAPS capsule Take 0.4 mg by mouth daily.     Marland Kitchen torsemide (DEMADEX) 10 MG tablet Take 1 tablet (10 mg total) by mouth every other day. 30 tablet 3  . XARELTO 20 MG TABS tablet TAKE ONE TABLET BY MOUTH ONCE DAILY WITH  SUPPER 90 tablet 3   No current facility-administered medications for this encounter.     BP (!) 104/54 (BP Location: Right Arm, Patient Position: Sitting, Cuff Size: Normal)   Pulse 88   Wt 250 lb 8 oz (113.6 kg)   SpO2 93%   BMI 34.94 kg/m  General: NAD, obese Neck: Thick, no JVD, no thyromegaly or thyroid nodule.  Lungs: CTAB CV: Nondisplaced PMI.  Heart regular S1/S2, no S3/S4, no murmur.  1+ ankle edema.  No carotid bruit.  Normal pedal pulses.  Abdomen: Soft, nontender, no hepatosplenomegaly, no distention.  Skin: Intact without lesions or rashes.  Neurologic: Alert and oriented x 3.  Psych: Normal affect. Extremities: No clubbing or cyanosis.   Assessment/Plan: 1. Cardiomyopathy: Nonischemic.  Low EF x years. Echo 6/16 showed stable EF 25-30% with septal-lateral dyssynchrony.  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  Interestingly, it also appears that he has a long-standing LBBB.  A chronic LBBB itself can potentially cause a cardiomyopathy and may be the source of his low EF.   Coronary angiography in 6/17 with nonobstructive disease. Now s/p Medtronic CRT-D device placement, feels better.  Repeat echo in 1/18 with increase in EF to 40-45%.  NYHA class II symptoms, not volume overload by exam or Optivol (checked today). Stable from cardiac standpoint.  - Continue Coreg, he is at goal dose. - Continue Entresto 97/103 bid.  BMET today.  - K has been high with spironolactone so now off.   - Continue torsemide 10 mg every other day.    -  Continue digoxin, check level today.  2. Hyperlipidemia: Patient is on simvastatin.  Check lipids today.  3. CKD: BMET today.  4. Atrial flutter: s/p ablation, in NSR today.  5. Atrial fibrillation: Patient had afib noted as well as flutter.  Therefore, I think that he should stay on Xarelto long-term.  CBC today.  Optivol interrogation shows occasional, short runs of atrial fibrillation.  He does not feel them.  6. Memory difficulty: Followup with neurology for neurocognitive testing.   Followup in 3 months.  Loralie Champagne 05/31/2017

## 2017-06-01 ENCOUNTER — Ambulatory Visit (INDEPENDENT_AMBULATORY_CARE_PROVIDER_SITE_OTHER): Payer: Medicare Other | Admitting: Endocrinology

## 2017-06-01 ENCOUNTER — Encounter: Payer: Self-pay | Admitting: Endocrinology

## 2017-06-01 VITALS — BP 122/62 | HR 83 | Ht 71.0 in | Wt 249.0 lb

## 2017-06-01 DIAGNOSIS — N183 Chronic kidney disease, stage 3 (moderate): Secondary | ICD-10-CM

## 2017-06-01 DIAGNOSIS — E1122 Type 2 diabetes mellitus with diabetic chronic kidney disease: Secondary | ICD-10-CM

## 2017-06-01 DIAGNOSIS — Z794 Long term (current) use of insulin: Secondary | ICD-10-CM | POA: Diagnosis not present

## 2017-06-01 LAB — POCT GLYCOSYLATED HEMOGLOBIN (HGB A1C): Hemoglobin A1C: 8.9

## 2017-06-01 NOTE — Progress Notes (Signed)
Subjective:    Patient ID: Justin Hill, male    DOB: Jun 27, 1941, 76 y.o.   MRN: [id number]  HPI Pt returns for f/u of diabetes mellitus: DM type: Insulin-requiring type 2. Dx'ed: 2671 Complications: CHF, toe ulcer, partial toe amputation, polyneuropathy, and renal insufficiency.  Therapy: insulin since 2012.  DKA: never.   Severe hypoglycemia: never.    Pancreatitis: never.   Other: he takes human insulin for cost reasons; in October of 2014, he was changed from multiple daily injections to BID, due to persistently high a1c.  The pattern of cbg's indicates he needs NPH in AM and 70/30 with dinner.  Interval history: He has been taking the NPH, 90 units every morning (he has not had to reduce for exercise).  no cbg record, but states cbg's vary from 150-200's.  It is in general higher as the day goes on.  No recent steroids.   Past Medical History:  Diagnosis Date  . Altered mental status   . Cardiomyopathy   . CHF (congestive heart failure) ([place])   . Chronic kidney disease (CKD), stage III (moderate)   . Communicating hydrocephalus   . Diabetes mellitus   . Diverticulosis   . Hypercholesterolemia   . Hyperglycemia   . Hypertension   . Paroxysmal atrial fibrillation ([facility])   . Personal history of kidney stones     Past Surgical History:  Procedure Laterality Date  . ATRIAL FLUTTER ABLATION N/A 01/19/2015   Procedure: ATRIAL FLUTTER ABLATION;  Surgeon: [name], MD;  Location[facility] C[facility] LAB;  Service: Cardiovascular;  Laterality: N/A;  . CARDIAC CATHETERIZATION N/A 05/02/2016   Procedure: Right/Left Heart Cath and Coronary Angiography;  Surgeon: Larey Dresser, MD;  Location: [facility] [facility] CV LAB;  Service: Cardiovascular;  Laterality: N/A;  . COLONOSCOPY N/A 12/29/2013   Procedure: COLONOSCOPY;  Surgeon: Lafayette Dragon, MD;  Location: [facility] ENDOSCOPY;  Service: Endoscopy;  Laterality: N/A;  . EP IMPLANTABLE DEVICE N/A 06/05/2016   MDT Hillery Aldo XT CRTD implanted by Dr Rayann Heman   .  [place]   left. Muscle reattachment  . TOE SURGERY Right 12/2012   2nd toe  . TONSILLECTOMY      Social History   Social History  . Marital status: Married    Spouse name: Rosemarie Beath  . Number of children: 1  . Years of education: 53   Occupational History  . retired    Social History Main Topics  . Smoking status: Former Smoker    Types: Cigarettes  . Smokeless tobacco: Never Used  . Alcohol use No  . Drug use: No  . Sexual activity: Not on file   Other Topics Concern  . Not on file   Social History Narrative   Regular exercise-no   Rare caffeine use        Current Outpatient Prescriptions on File Prior to Visit  Medication Sig Dispense Refill  . Ascorbic Acid (VITAMIN C) 500 MG CAPS Take 1 capsule by mouth 2 (two) times daily.     . Bilberry, Vaccinium myrtillus, (BILBERRY PO) Take by mouth.    . carvedilol (COREG) 25 MG tablet Take 25 mg by mouth 2 (two) times daily with a meal.    . digoxin (LANOXIN) 0.125 MG tablet TAKE ONE TABLET BY MOUTH ONCE DAILY 90 tablet 3  . ENTRESTO 97-103 MG TAKE ONE TABLET BY MOUTH TWICE DAILY 180 tablet 3  . insulin NPH Human (NOVOLIN N) 100 UNIT/ML injection Inject 0.9 mLs (  90 Units total) into the skin every morning. 30 mL 11  . insulin NPH-regular Human (NOVOLIN 70/30) (70-30) 100 UNIT/ML injection Inject 40 Units into the skin daily with supper. 20 mL 11  . Multiple Vitamin (MULTIVITAMIN) tablet Take 1 tablet by mouth daily.      . simvastatin (ZOCOR) 40 MG tablet Take 40 mg by mouth at bedtime.      . tamsulosin (FLOMAX) 0.4 MG CAPS capsule Take 0.4 mg by mouth daily.     Marland Kitchen torsemide (DEMADEX) 10 MG tablet Take 1 tablet (10 mg total) by mouth every other day. 30 tablet 3  . XARELTO 20 MG TABS tablet TAKE ONE TABLET BY MOUTH ONCE DAILY WITH  SUPPER 90 tablet 3   No current facility-administered medications on file prior to visit.     Allergies  Allergen Reactions  . Aspirin Other (See Comments)    Burps,  burning, stomach pains, etc  . Lisinopril Cough    Severe coughing  . Penicillins Other (See Comments)    syncope    Family History  Problem Relation Age of Onset  . Heart failure Maternal Grandmother   . Diabetes type II Sister 66  . Hyperlipidemia Other        Parent  . Stroke Other        Parent  . Hypertension Other        Parent  . Diabetes Other        Parent  . Colon cancer Neg Hx   . Heart attack Neg Hx     BP 122/62   Pulse 83   Ht 5\' 11"  (1.803 m)   Wt 249 lb (112.9 kg)   SpO2 94%   BMI 34.73 kg/m   Review of Systems He denies hypoglycemia    Objective:   Physical Exam VITAL SIGNS:  See vs page GENERAL: no distress EXTEMITIES: no deformity, except for hammer toes on the left foot. feet are of normal temp.  right 2nd toe is surgically absent.   SKIN: healing ulcer at the left bunion area CV: trace bilat leg edema. There is bilateral onychomycosis, rust-colored discoloration, and varicosities.   PULSES: dorsalis pedis intact bilat.   NEURO: sensation is intact to touch on the feet, but decreased from normal.    Lab Results  Component Value Date   HGBA1C 8.9 06/01/2017      Assessment & Plan:  Insulin-requiring type 2 DM, with polyneuropathy: worse Renal insuff: in this setting (and confirmed by pattern of cbg's), she needs 70/30 with supper, rather than NPH.    Patient Instructions  Please continue the same morning insulin: 110 units of NPH (50 if you are going to be active).   With supper, please continue "70/30,"  40 units.   Please call us next week, to tell us how the blood sugar is doing.   On this type of insulin schedule, you should eat meals on a regular schedule.  If a meal is missed or significantly delayed, your blood sugar could go low.   check your blood sugar twice a day.  vary the time of day when you check, between before the 3 meals, and at bedtime.  also check if you have symptoms of your blood sugar being too high or too low.   please keep a record of the readings and bring it to your next appointment here.  please call us sooner if your blood sugar goes below 70, or if you have a lot of  readings over 200.   If you have any blood sugar under 80, please write on the paper why you think that was.   Please come back for a follow-up appointment in 2 months.

## 2017-06-01 NOTE — Patient Instructions (Addendum)
Please continue the same morning insulin: 110 units of NPH (50 if you are going to be active).   With supper, please continue "70/30,"  40 units.   Please call us next week, to tell us how the blood sugar is doing.   On this type of insulin schedule, you should eat meals on a regular schedule.  If a meal is missed or significantly delayed, your blood sugar could go low.   check your blood sugar twice a day.  vary the time of day when you check, between before the 3 meals, and at bedtime.  also check if you have symptoms of your blood sugar being too high or too low.  please keep a record of the readings and bring it to your next appointment here.  please call us sooner if your blood sugar goes below 70, or if you have a lot of readings over 200.   If you have any blood sugar under 80, please write on the paper why you think that was.   Please come back for a follow-up appointment in 2 months.

## 2017-06-02 ENCOUNTER — Telehealth (HOSPITAL_COMMUNITY): Payer: Self-pay | Admitting: *Deleted

## 2017-06-02 DIAGNOSIS — I5022 Chronic systolic (congestive) heart failure: Secondary | ICD-10-CM

## 2017-06-02 MED ORDER — FENOFIBRATE 54 MG PO TABS: 54.0000 mg | ORAL_TABLET | Freq: Every day | ORAL | 3 refills | Status: DC

## 2017-06-02 NOTE — Telephone Encounter (Signed)
-----   Message from Larey Dresser, MD sent at 05/29/2017  4:30 PM EDT ----- Triglycerides very high.  Would suggest he start taking fenofibrate 54 mg daily, repeat lipids in 2 months.

## 2017-06-02 NOTE — Telephone Encounter (Signed)
Spoke with patients wife she is aware and agreeable.

## 2017-06-05 ENCOUNTER — Telehealth: Payer: Self-pay | Admitting: Endocrinology

## 2017-06-05 NOTE — Telephone Encounter (Signed)
Patients wife notified

## 2017-06-05 NOTE — Telephone Encounter (Signed)
Patient's blood sugar reached 450 yesterday. The blood sugar right now is at 290. Call patient's wife as soon as possible to advise.

## 2017-06-05 NOTE — Telephone Encounter (Signed)
See message and please advise, Thanks!  

## 2017-06-05 NOTE — Telephone Encounter (Signed)
please call patient: Any idea why it is so high (steroids, illness, etc?) Then please increase the morning insulin to 150 units of NPH (50 if you are going to be active).   With supper, please increase "70/30,"  insulin to 50 units.  For this morning, take an extra 30 units of 70/30, just this one time.  Please call us next week, to tell us how the blood sugar is doing

## 2017-06-05 NOTE — Telephone Encounter (Signed)
No, don't worry.  This is not causing the problem

## 2017-06-05 NOTE — Telephone Encounter (Signed)
I contacted the patient's wife. She stated the only new medication he has started taking is fenofibrate. She wanted to know if this could be causing the high blood sugar readings? She verbalized understanding on new instructions and will call back next week to report blood sugar readings.

## 2017-06-10 NOTE — Telephone Encounter (Signed)
Please increase the morning insulin to: 170 units of NPH (50 if you are going to be active).   With supper, please increase to "70/30,"  60 units.  Please call or message [place] next week, to tell us how the blood sugar is doing.

## 2017-06-10 NOTE — Telephone Encounter (Signed)
See message and please advise, Thanks!  

## 2017-06-10 NOTE — Telephone Encounter (Signed)
Called and talked to patients wife. She wrote down his new dosages and understood instructions with out any questions.

## 2017-06-10 NOTE — Telephone Encounter (Signed)
Patient calling to report BS levels: 7/13: 304 morning, 220 afternoon,  7/14: 279 morning. 396 afternoon, 248 nighttime 7/15: 194 morning. 282 afternoon, 240 nighttime 7/16: 200 morning. 184 afternoon, 282 nighttime 7/17: 205 morning. 200 afternoon 7/18: 205 morning   Asked for return call when possible.

## 2017-06-16 ENCOUNTER — Ambulatory Visit (INDEPENDENT_AMBULATORY_CARE_PROVIDER_SITE_OTHER): Payer: Medicare Other

## 2017-06-16 DIAGNOSIS — I5022 Chronic systolic (congestive) heart failure: Secondary | ICD-10-CM

## 2017-06-16 DIAGNOSIS — Z95810 Presence of automatic (implantable) cardiac defibrillator: Secondary | ICD-10-CM

## 2017-06-16 NOTE — Progress Notes (Signed)
EPIC Encounter for ICM Monitoring  Patient Name: Justin Hill is a 76 y.o. male Date: 06/16/2017 Primary Care Physican: Cyndy Freeze, MD Primary Cardiologist:[place] Electrophysiologist: Allred Dry Weight:unknown Bi-V Pacing: 96.5%      Spoke with wife. Heart Failure questions reviewed, pt asymptomatic.  Patient is feeling fine.    Thoracic impedance abnormal suggesting fluid accumulation since 06/12/2017.  Patient and wife eat out all the time and advised restaurant foods are high sodium.   Prescribed dosage: Torsemide 10 mg 1 tablet every other day.   Labs: 05/29/2017 Creatinine 1.32, BUN 13, Potassium 4.7, Sodium 134, EGFR 51-60 03/24/2017 Creatinine 1.47, BUN 25, Potassium 5.2, Sodium 133, EGFR 46-54 01/09/2017 Creatinine 1.42, BUN 15, Potassium 4.6, Sodium 136, EGFR 47-55 12/22/2016 Creatinine 1.56, BUN 22, Potassium 5.0, Sodium 138, EGFR 43-50 12/02/2016 Creatinine 1.20, BUN 14, Potassium 4.9, Sodium 138, EGFR >60 10/03/2016 Creatinine 1.27, BUN 16, Potassium 4.9, Sodium 135, EGFR 54->60  08/01/2016 Creatinine 1.21, BUN 16, Potassium 5.0, Sodium 138, EGFR 59-68  07/23/2016 Creatinine 1.52, BUN 19, Potassium 4.5, Sodium 136  05/29/2016 Creatinine 1.26, BUN 15, Potassium 4.6, Sodium 141  04/30/2016 Creatinine 1.17, BUN 9, Potassium 4.9, Sodium 138  12/28/2015 Creatinine 1.35, BUN 13, Potassium 4.9, Sodium 142  11/30/2016Creatinine 1.37, BUN 20, Potassium 4.3, Sodium 139   Recommendations: Copy of ICM check sent to Dr Aundra Dubin and Dr Rayann Heman and if any recommendations will call back.  Patient will be taking a cruise from August 5th - 11th.    Follow-up plan: ICM clinic phone appointment on 06/23/2017 to recheck fluid levels.    Wife asked about what should patient do regarding going through airport security with defibrillator and advised wife to call [employer] for precautions.      3 month ICM trend: 06/16/2017   1 Year ICM trend:      Rosalene Billings,  RN 06/16/2017 11:53 AM

## 2017-06-23 ENCOUNTER — Ambulatory Visit (INDEPENDENT_AMBULATORY_CARE_PROVIDER_SITE_OTHER): Payer: Self-pay

## 2017-06-23 DIAGNOSIS — Z95810 Presence of automatic (implantable) cardiac defibrillator: Secondary | ICD-10-CM

## 2017-06-23 DIAGNOSIS — I5022 Chronic systolic (congestive) heart failure: Secondary | ICD-10-CM

## 2017-06-23 NOTE — Progress Notes (Signed)
EPIC Encounter for ICM Monitoring  Patient Name: Justin Hill is a 76 y.o. male Date: 06/23/2017 Primary Care Physican: Cyndy Freeze, MD Primary Cardiologist:McLean Electrophysiologist: Allred Dry Weight:unknown Bi-V Pacing: 96.9%        Spoke with wife. Heart Failure questions reviewed, pt has swelling in feet.  Patient will be on a cruise August 4th - 11th and wife asked if patient can take any extra Torsemide if needed during cruise.    Thoracic impedance slightly below baseline suggesting fluid accumulation since 06/13/2017.    Prescribed dosage: Torsemide 10 mg 1 tablet every other day.   Labs: 05/29/2017 Creatinine 1.32, BUN 13, Potassium 4.7, Sodium 134, EGFR 51-60 03/24/2017 Creatinine 1.47, BUN 25, Potassium 5.2, Sodium 133, EGFR 46-54 01/09/2017 Creatinine 1.42, BUN 15, Potassium 4.6, Sodium 136, EGFR 47-55 12/22/2016 Creatinine 1.56, BUN 22, Potassium 5.0, Sodium 138, EGFR 43-50 12/02/2016 Creatinine 1.20, BUN 14, Potassium 4.9, Sodium 138, EGFR >60 10/03/2016 Creatinine 1.27, BUN 16, Potassium 4.9, Sodium 135, EGFR 54->60  08/01/2016 Creatinine 1.21, BUN 16, Potassium 5.0, Sodium 138, EGFR 59-68  07/23/2016 Creatinine 1.52, BUN 19, Potassium 4.5, Sodium 136  05/29/2016 Creatinine 1.26, BUN 15, Potassium 4.6, Sodium 141  04/30/2016 Creatinine 1.17, BUN 9, Potassium 4.9, Sodium 138  12/28/2015 Creatinine 1.35, BUN 13, Potassium 4.9, Sodium 142  11/30/2016Creatinine 1.37, BUN 20, Potassium 4.3, Sodium 139   Recommendations:  Copy of ICM check sent to Dr Aundra Dubin and Dr Rayann Heman for review and will call back if any recommendations.   Wife is also asking if patient can take extra Torsemide during the cruise if needed.    Follow-up plan: ICM clinic phone appointment on 07/09/2017.      3 month ICM trend: 06/23/2017   1 Year ICM trend:      Rosalene Billings, RN 06/23/2017 11:58 AM

## 2017-06-25 DIAGNOSIS — L299 Pruritus, unspecified: Secondary | ICD-10-CM | POA: Diagnosis not present

## 2017-06-25 DIAGNOSIS — L300 Nummular dermatitis: Secondary | ICD-10-CM | POA: Diagnosis not present

## 2017-06-25 NOTE — Progress Notes (Signed)
Called wife and reported no recommendations at this time.  Advised if patient needs medical attention during cruise to seek the medical staff on the ship.  Advised to travel with extra Torsemide in the event the medical personnel on board advises to take any extra.  Advised to call when she returns if needed other wise will recheck fluid levels on 07/09/2017.  She stated patient still has some swelling of the feet.

## 2017-06-28 NOTE — Progress Notes (Signed)
Increase torsemide to to 10 mg daily for 4 days then back to every other day.  During cruise, would recommend taking torsemide 10 mg daily as he will be getting more sodium in his diet.

## 2017-06-29 NOTE — Progress Notes (Signed)
Attempted call to wife and patient.  Cruise was to start on 06/27/2017.  Left voice mail message stating Dr Claris Gladden recommendation is to take Torsemide 10 mg daily during the time of cruise and to go back to every other day once he returns home.  Advised to call if patient has any fluid symptoms when he returns from his trip.

## 2017-07-09 ENCOUNTER — Ambulatory Visit (INDEPENDENT_AMBULATORY_CARE_PROVIDER_SITE_OTHER): Payer: Self-pay

## 2017-07-09 ENCOUNTER — Telehealth: Payer: Self-pay

## 2017-07-09 DIAGNOSIS — I5022 Chronic systolic (congestive) heart failure: Secondary | ICD-10-CM

## 2017-07-09 DIAGNOSIS — Z95810 Presence of automatic (implantable) cardiac defibrillator: Secondary | ICD-10-CM

## 2017-07-09 NOTE — Progress Notes (Signed)
EPIC Encounter for ICM Monitoring  Patient Name: Justin Hill is a 76 y.o. male Date: 07/09/2017 Primary Care Physican: Cyndy Freeze, MD Primary Cardiologist:[place]Lean Electrophysiologist: Allred Dry Weight:unknown Bi-V Pacing: 96.6%       Attempted call to wife/patient. Left message to return call.  Transmission reviewed.    Thoracic impedance returned to normal but was abnormal suggesting fluid accumulation from 8/4 to 8/9.  Patient was on cruise from 8/4 to 8/11   Prescribed dosage: Torsemide 10 mg 1 tablet every other day.   Labs: 05/29/2017 Creatinine 1.32, BUN 13, Potassium 4.7, Sodium 134, EGFR 51-60 03/24/2017 Creatinine 1.47, BUN 25, Potassium 5.2, Sodium 133, EGFR 46-54 01/09/2017 Creatinine 1.42, BUN 15, Potassium 4.6, Sodium 136, EGFR 47-55 12/22/2016 Creatinine 1.56, BUN 22, Potassium 5.0, Sodium 138, EGFR 43-50 12/02/2016 Creatinine 1.20, BUN 14, Potassium 4.9, Sodium 138, EGFR >60 10/03/2016 Creatinine 1.27, BUN 16, Potassium 4.9, Sodium 135, EGFR 54->60  08/01/2016 Creatinine 1.21, BUN 16, Potassium 5.0, Sodium 138, EGFR 59-68  07/23/2016 Creatinine 1.52, BUN 19, Potassium 4.5, Sodium 136  05/29/2016 Creatinine 1.26, BUN 15, Potassium 4.6, Sodium 141  04/30/2016 Creatinine 1.17, BUN 9, Potassium 4.9, Sodium 138  12/28/2015 Creatinine 1.35, BUN 13, Potassium 4.9, Sodium 142  11/30/2016Creatinine 1.37, BUN 20, Potassium 4.3, Sodium 139   Recommendations: NONE - Unable to reach patient   Follow-up plan: ICM clinic phone appointment on 08/04/2017.  He is due to make follow up appointments with Dr Aundra Dubin and Dr Rayann Heman for October.   Copy of ICM check sent to device physician.   3 month ICM trend: 07/09/2017   1 Year ICM trend:      Rosalene Billings, RN 07/09/2017 11:29 AM

## 2017-07-09 NOTE — Telephone Encounter (Signed)
Remote ICM transmission received.  Attempted patient call and left message to return call.   

## 2017-07-10 NOTE — Progress Notes (Addendum)
Wife returned call and stated they had a great time on the cruise.  She said patient fell and hit his head after slipping on some water.  Patient did not have any swelling of feet during cruise.  He did very well on the cruise and the only problem is his leg is sore from the fall. Reviewed transmission.  Next ICM transmission 08/04/2017

## 2017-07-13 ENCOUNTER — Telehealth: Payer: Self-pay

## 2017-07-13 NOTE — Telephone Encounter (Signed)
Received voice mail message from wife asking for Xarelto 20 mg samples for patient.  She requested call back to let her know if samples are available.  Message routed to HF clinic.

## 2017-07-14 NOTE — Telephone Encounter (Signed)
Attempted call back to wife. Left voice mail that I forwarded her message to the HF clinic regarding samples and she can contact the office for follow up if needed.

## 2017-07-15 ENCOUNTER — Encounter: Payer: Self-pay | Admitting: Nurse Practitioner

## 2017-07-15 NOTE — Telephone Encounter (Signed)
Per chart samples were left at [facility] [address] office

## 2017-07-16 DIAGNOSIS — L300 Nummular dermatitis: Secondary | ICD-10-CM | POA: Diagnosis not present

## 2017-07-16 DIAGNOSIS — L299 Pruritus, unspecified: Secondary | ICD-10-CM | POA: Diagnosis not present

## 2017-07-31 ENCOUNTER — Telehealth (HOSPITAL_COMMUNITY): Payer: Self-pay | Admitting: *Deleted

## 2017-07-31 NOTE — Telephone Encounter (Signed)
Pt's wife called to request pt have labwork (L/L) done at [facility] in [place] on Monday so they don't have to drive here.  Order faxed to [place] at [phone]

## 2017-08-03 ENCOUNTER — Encounter: Payer: Self-pay | Admitting: Endocrinology

## 2017-08-03 ENCOUNTER — Other Ambulatory Visit: Payer: Self-pay | Admitting: Cardiology

## 2017-08-03 ENCOUNTER — Ambulatory Visit (INDEPENDENT_AMBULATORY_CARE_PROVIDER_SITE_OTHER): Payer: Medicare Other | Admitting: Endocrinology

## 2017-08-03 ENCOUNTER — Other Ambulatory Visit (HOSPITAL_COMMUNITY): Payer: Medicare Other

## 2017-08-03 VITALS — BP 128/82 | HR 89 | Wt 255.0 lb

## 2017-08-03 DIAGNOSIS — N183 Chronic kidney disease, stage 3 unspecified: Secondary | ICD-10-CM

## 2017-08-03 DIAGNOSIS — E784 Other hyperlipidemia: Secondary | ICD-10-CM | POA: Diagnosis not present

## 2017-08-03 DIAGNOSIS — Z794 Long term (current) use of insulin: Secondary | ICD-10-CM | POA: Diagnosis not present

## 2017-08-03 DIAGNOSIS — E1122 Type 2 diabetes mellitus with diabetic chronic kidney disease: Secondary | ICD-10-CM | POA: Diagnosis not present

## 2017-08-03 LAB — POCT GLYCOSYLATED HEMOGLOBIN (HGB A1C): Hemoglobin A1C: 8.5

## 2017-08-03 MED ORDER — INSULIN NPH ISOPHANE & REGULAR (70-30) 100 UNIT/ML ~~LOC~~ SUSP: 60.0000 [IU] | Freq: Every day | SUBCUTANEOUS | 11 refills | Status: DC

## 2017-08-03 MED ORDER — INSULIN NPH (HUMAN) (ISOPHANE) 100 UNIT/ML ~~LOC~~ SUSP: 110.0000 [IU] | SUBCUTANEOUS | 11 refills | Status: DC

## 2017-08-03 NOTE — Progress Notes (Signed)
Subjective:    Patient ID: Justin Hill, male    DOB: 1941/01/03, 76 y.o.   MRN: [id number]  HPI Pt returns for f/u of diabetes mellitus: DM type: Insulin-requiring type 2. Dx'ed: 4098 Complications: CHF, toe ulcer, partial toe amputation, polyneuropathy, and renal insufficiency.  Therapy: insulin since 2012.  DKA: never.   Severe hypoglycemia: never.    Pancreatitis: never.   Other: he takes human insulin for cost reasons; in October of 2014, he was changed from multiple daily injections to BID, due to persistently high a1c.  The pattern of cbg's indicates he needs NPH in AM and 70/30 with dinner; wife gives insulin, due to memory loss Interval history: He has been taking the NPH, 100 units every morning (he has not had to reduce for exercise), and 60 units of 70/30 with supper.  no cbg record, but states cbg's vary from 124-300.  It is in general higher as the day goes on.  He had another strtoid injection last month (for itching).    Past Medical History:  Diagnosis Date  . Altered mental status   . Cardiomyopathy   . CHF (congestive heart failure) ([place])   . Chronic kidney disease (CKD), stage III (moderate)   . Communicating hydrocephalus   . Diabetes mellitus   . Diverticulosis   . Hypercholesterolemia   . Hyperglycemia   . Hypertension   . Paroxysmal atrial fibrillation (H[facility])   . Personal history of kidney stones     Past Surgical History:  Procedure Laterality Date  . ATRIAL FLUTTER ABLATION N/A 01/19/2015   Procedure: ATRIAL FLUTTER ABLATION;  Surgeon: [name], MD;  Location: [facility] CATH LAB;  Service: Cardiovascular;  Laterality: N/A;  . CARDIAC CATHETERIZATION N/A 05/02/2016   Procedure: Right/Left Heart Cath and Coronary Angiography;  Surgeon: Larey Dresser, MD;  Location: [place] CV LAB;  Service: Cardiovascular;  Laterality: N/A;  . COLONOSCOPY N/A 12/29/2013   Procedure: COLONOSCOPY;  Surgeon: Lafayette Dragon, MD;  Location: [facility] ENDOSCOPY;  Service:  Endoscopy;  Laterality: N/A;  . EP IMPLANTABLE DEVICE N/A 06/05/2016   MDT Hillery Aldo XT CRTD implanted by Dr Rayann Heman   . [place]   left. Muscle reattachment  . TOE SURGERY Right 12/2012   2nd toe  . TONSILLECTOMY      Social History   Social History  . Marital status: Married    Spouse name: Rosemarie Beath  . Number of children: 1  . Years of education: 31   Occupational History  . retired    Social History Main Topics  . Smoking status: Former Smoker    Types: Cigarettes  . Smokeless tobacco: Never Used  . Alcohol use No  . Drug use: No  . Sexual activity: Not on file   Other Topics Concern  . Not on file   Social History Narrative   Regular exercise-no   Rare caffeine use        Current Outpatient Prescriptions on File Prior to Visit  Medication Sig Dispense Refill  . Ascorbic Acid (VITAMIN C) 500 MG CAPS Take 1 capsule by mouth 2 (two) times daily.     . carvedilol (COREG) 25 MG tablet Take 25 mg by mouth 2 (two) times daily with a meal.    . digoxin (LANOXIN) 0.125 MG tablet TAKE ONE TABLET BY MOUTH ONCE DAILY 90 tablet 3  . ENTRESTO 97-103 MG TAKE ONE TABLET BY MOUTH TWICE DAILY 180 tablet 3  . fenofibrate 54 MG  tablet Take 1 tablet (54 mg total) by mouth daily. 30 tablet 3  . Multiple Vitamin (MULTIVITAMIN) tablet Take 1 tablet by mouth daily.      . simvastatin (ZOCOR) 40 MG tablet Take 40 mg by mouth at bedtime.      . tamsulosin (FLOMAX) 0.4 MG CAPS capsule Take 0.4 mg by mouth daily.     Marland Kitchen torsemide (DEMADEX) 10 MG tablet Take 1 tablet (10 mg total) by mouth every other day. 30 tablet 3  . XARELTO 20 MG TABS tablet TAKE ONE TABLET BY MOUTH ONCE DAILY WITH  SUPPER 90 tablet 3  . Bilberry, Vaccinium myrtillus, (BILBERRY PO) Take by mouth.     No current facility-administered medications on file prior to visit.     Allergies  Allergen Reactions  . Aspirin Other (See Comments)    Burps, burning, stomach pains, etc  . Lisinopril Cough    Severe  coughing  . Penicillins Other (See Comments)    syncope    Family History  Problem Relation Age of Onset  . Heart failure Maternal Grandmother   . Diabetes type II Sister 17  . Hyperlipidemia Other        Parent  . Stroke Other        Parent  . Hypertension Other        Parent  . Diabetes Other        Parent  . Colon cancer Neg Hx   . Heart attack Neg Hx    BP 128/82 (BP Location: Left Arm, Patient Position: Sitting)   Pulse 89   Wt 255 lb (115.7 kg)   SpO2 96%   BMI 35.57 kg/m   Review of Systems He denies hypoglycemia.     Objective:   Physical Exam VITAL SIGNS:  See vs page GENERAL: no distress Pulses: foot pulses are intact bilaterally.   MSK: no deformity, except for hammer toes on the left foot. right 2nd toe is surgically absent CV: trace bilat edema of the legs.  Skin:  no ulcer on the feet or ankles, but the skin is dry.  normal temp on the feet and ankles. There is bilateral onychomycosis, rust-colored discoloration, and varicosities.  Neuro: sensation is intact to touch on the feet and ankles, but decreased from normal.  A1c=8.5%  Lab Results  Component Value Date   CREATININE 1.32 (H) 05/29/2017   BUN 13 05/29/2017   NA 134 (L) 05/29/2017   K 4.7 05/29/2017   CL 99 (L) 05/29/2017   CO2 26 05/29/2017      Assessment & Plan:  Memory loss: in this setting, he is no a candidate for aggressive glycemic control Insulin-requiring type 2 DM, with renal insuff: he needs increased rx Itching: steroids prob account for some of this evel a1c  Patient Instructions  Please continue the same morning insulin: 110 units of NPH.   With supper, please continue "70/30,"  60 units.   On this type of insulin schedule, you should eat meals on a regular schedule.  If a meal is missed or significantly delayed, your blood sugar could go low.   check your blood sugar twice a day.  vary the time of day when you check, between before the 3 meals, and at bedtime.  also  check if you have symptoms of your blood sugar being too high or too low.  please keep a record of the readings and bring it to your next appointment here.  please call us sooner if  your blood sugar goes below 70, or if you have a lot of readings over 200.   If you have any blood sugar under 80, please write on the paper why you think that was.   Please come back for a follow-up appointment in 2 months.

## 2017-08-03 NOTE — Patient Instructions (Addendum)
Please continue the same morning insulin: 110 units of NPH.   With supper, please continue "70/30,"  60 units.   On this type of insulin schedule, you should eat meals on a regular schedule.  If a meal is missed or significantly delayed, your blood sugar could go low.   check your blood sugar twice a day.  vary the time of day when you check, between before the 3 meals, and at bedtime.  also check if you have symptoms of your blood sugar being too high or too low.  please keep a record of the readings and bring it to your next appointment here.  please call us sooner if your blood sugar goes below 70, or if you have a lot of readings over 200.   If you have any blood sugar under 80, please write on the paper why you think that was.   Please come back for a follow-up appointment in 2 months.

## 2017-08-04 ENCOUNTER — Ambulatory Visit (INDEPENDENT_AMBULATORY_CARE_PROVIDER_SITE_OTHER): Payer: Medicare Other | Admitting: *Deleted

## 2017-08-04 DIAGNOSIS — Z95810 Presence of automatic (implantable) cardiac defibrillator: Secondary | ICD-10-CM

## 2017-08-04 DIAGNOSIS — I428 Other cardiomyopathies: Secondary | ICD-10-CM | POA: Diagnosis not present

## 2017-08-04 DIAGNOSIS — I5022 Chronic systolic (congestive) heart failure: Secondary | ICD-10-CM | POA: Diagnosis not present

## 2017-08-04 LAB — LIPID PANEL W/O CHOL/HDL RATIO
Cholesterol, Total: 173 mg/dL (ref 100–199)
HDL: 49 mg/dL (ref >39)
LDL CALC: 82 mg/dL (ref 0–99)
Triglycerides: 208 mg/dL — ABNORMAL HIGH (ref 0–149)
VLDL Cholesterol Cal: 42 mg/dL — ABNORMAL HIGH (ref 5–40)

## 2017-08-04 LAB — AMBIG ABBREV LP DEFAULT

## 2017-08-04 LAB — HEPATIC FUNCTION PANEL
ALT: 20 IU/L (ref 0–44)
AST: 19 IU/L (ref 0–40)
Albumin: 4.3 g/dL (ref 3.5–4.8)
Alkaline Phosphatase: 47 IU/L (ref 39–117)
Bilirubin Total: 0.3 mg/dL (ref 0.0–1.2)
Bilirubin, Direct: 0.12 mg/dL (ref 0.00–0.40)
Total Protein: 6.6 g/dL (ref 6.0–8.5)

## 2017-08-04 LAB — AMBIG ABBREV HFP7 DEFAULT

## 2017-08-04 NOTE — Progress Notes (Signed)
EPIC Encounter for ICM Monitoring  Patient Name: Justin Hill is a 76 y.o. male Date: 08/04/2017 Primary Care Physican: Cyndy Freeze, MD Primary Cardiologist:[place] Electrophysiologist: Allred Dry Weight:255 lbs  Bi-V Pacing: 96.4%     Clinical Status (09-Jul-2017 to 04-Aug-2017) Treated VT/VF 0 episodes  V. Pacing 96.4%  Atrial Pacing 4.1% Upper Rate 130 bpm Lower Rate 60 bpm Time in AT/AF 0.2 hr/day (0.6%)  AT/AF 7 episodes  Longest AT/AF 3 hours VT-NS (>4 beats, >200 bpm) 1     Spoke with wife.  Heart Failure questions reviewed, pt has some occasional swelling in feet.  She said she thinks patient is having some signs of dementia and have physicians appointment for evaluation   Thoracic impedance normal.  Prescribed dosage: Torsemide 10 mg 1 tablet every other day.   Labs: 05/29/2017 Creatinine 1.32, BUN 13, Potassium 4.7, Sodium 134, EGFR 51-60 03/24/2017 Creatinine 1.47, BUN 25, Potassium 5.2, Sodium 133, EGFR 46-54 01/09/2017 Creatinine 1.42, BUN 15, Potassium 4.6, Sodium 136, EGFR 47-55 12/22/2016 Creatinine 1.56, BUN 22, Potassium 5.0, Sodium 138, EGFR 43-50 12/02/2016 Creatinine 1.20, BUN 14, Potassium 4.9, Sodium 138, EGFR >60 10/03/2016 Creatinine 1.27, BUN 16, Potassium 4.9, Sodium 135, EGFR 54->60  08/01/2016 Creatinine 1.21, BUN 16, Potassium 5.0, Sodium 138, EGFR 59-68  07/23/2016 Creatinine 1.52, BUN 19, Potassium 4.5, Sodium 136  05/29/2016 Creatinine 1.26, BUN 15, Potassium 4.6, Sodium 141  04/30/2016 Creatinine 1.17, BUN 9, Potassium 4.9, Sodium 138  12/28/2015 Creatinine 1.35, BUN 13, Potassium 4.9, Sodium 142  11/30/2016Creatinine 1.37, BUN 20, Potassium 4.3, Sodium 139   Recommendations: No changes.  Advised to limit salt intake to 2000 mg/day and fluid intake to < 2 liters/day.  Encouraged to call for fluid symptoms.  Follow-up plan: ICM clinic phone appointment on 09/03/2017.  Office appointment scheduled 09/07/2017 with Dr.  Aundra Dubin.  Copy of ICM check sent to Dr. Rayann Heman.   3 month ICM trend: 08/04/2017   1 Year ICM trend:      Rosalene Billings, RN 08/04/2017 3:52 PM

## 2017-08-04 NOTE — Progress Notes (Signed)
Remote ICD transmission.   

## 2017-08-05 ENCOUNTER — Encounter: Payer: Self-pay | Admitting: Cardiology

## 2017-08-05 LAB — CUP PACEART REMOTE DEVICE CHECK
Battery Remaining Longevity: 58 mo
Battery Voltage: 2.98 V
Brady Statistic AP VP Percent: 4.1 %
Brady Statistic AP VS Percent: 0.09 %
Brady Statistic AS VS Percent: 1.59 %
Date Time Interrogation Session: 20180911092606
HIGH POWER IMPEDANCE MEASURED VALUE: 83 Ohm
Implantable Lead Implant Date: 20170713
Implantable Lead Implant Date: 20170713
Implantable Lead Location: 753858
Implantable Lead Location: 753859
Implantable Lead Model: 4598
Implantable Lead Model: 5076
Lead Channel Impedance Value: 399 Ohm
Lead Channel Impedance Value: 418 Ohm
Lead Channel Impedance Value: 418 Ohm
Lead Channel Impedance Value: 475 Ohm
Lead Channel Impedance Value: 532 Ohm
Lead Channel Impedance Value: 703 Ohm
Lead Channel Impedance Value: 893 Ohm
Lead Channel Impedance Value: 931 Ohm
Lead Channel Pacing Threshold Amplitude: 3.25 V
Lead Channel Pacing Threshold Pulse Width: 0.8 ms
Lead Channel Sensing Intrinsic Amplitude: 14.875 mV
Lead Channel Setting Pacing Amplitude: 2.5 V
Lead Channel Setting Pacing Amplitude: 4.25 V
MDC IDC LEAD IMPLANT DT: 20170713
MDC IDC LEAD LOCATION: 753860
MDC IDC MSMT LEADCHNL LV IMPEDANCE VALUE: 456 Ohm
MDC IDC MSMT LEADCHNL LV IMPEDANCE VALUE: 589 Ohm
MDC IDC MSMT LEADCHNL LV IMPEDANCE VALUE: 703 Ohm
MDC IDC MSMT LEADCHNL LV IMPEDANCE VALUE: 893 Ohm
MDC IDC MSMT LEADCHNL RA PACING THRESHOLD AMPLITUDE: 0.5 V
MDC IDC MSMT LEADCHNL RA PACING THRESHOLD PULSEWIDTH: 0.4 ms
MDC IDC MSMT LEADCHNL RA SENSING INTR AMPL: 1 mV
MDC IDC MSMT LEADCHNL RA SENSING INTR AMPL: 1 mV
MDC IDC MSMT LEADCHNL RV IMPEDANCE VALUE: 513 Ohm
MDC IDC MSMT LEADCHNL RV PACING THRESHOLD AMPLITUDE: 0.5 V
MDC IDC MSMT LEADCHNL RV PACING THRESHOLD PULSEWIDTH: 0.4 ms
MDC IDC MSMT LEADCHNL RV SENSING INTR AMPL: 14.875 mV
MDC IDC PG IMPLANT DT: 20170713
MDC IDC SET LEADCHNL LV PACING PULSEWIDTH: 0.8 ms
MDC IDC SET LEADCHNL RA PACING AMPLITUDE: 2 V
MDC IDC SET LEADCHNL RV PACING PULSEWIDTH: 0.4 ms
MDC IDC SET LEADCHNL RV SENSING SENSITIVITY: 0.3 mV
MDC IDC STAT BRADY AS VP PERCENT: 94.22 %
MDC IDC STAT BRADY RA PERCENT PACED: 4.09 %
MDC IDC STAT BRADY RV PERCENT PACED: 12.12 %

## 2017-08-07 ENCOUNTER — Other Ambulatory Visit (HOSPITAL_COMMUNITY): Payer: Self-pay | Admitting: Cardiology

## 2017-08-19 ENCOUNTER — Encounter: Payer: Self-pay | Admitting: Cardiology

## 2017-08-26 DIAGNOSIS — R413 Other amnesia: Secondary | ICD-10-CM | POA: Diagnosis not present

## 2017-08-26 DIAGNOSIS — G919 Hydrocephalus, unspecified: Secondary | ICD-10-CM | POA: Diagnosis not present

## 2017-08-26 DIAGNOSIS — Z79899 Other long term (current) drug therapy: Secondary | ICD-10-CM | POA: Diagnosis not present

## 2017-08-26 DIAGNOSIS — E782 Mixed hyperlipidemia: Secondary | ICD-10-CM | POA: Diagnosis not present

## 2017-08-26 DIAGNOSIS — Z23 Encounter for immunization: Secondary | ICD-10-CM | POA: Diagnosis not present

## 2017-09-01 DIAGNOSIS — R413 Other amnesia: Secondary | ICD-10-CM | POA: Diagnosis not present

## 2017-09-01 DIAGNOSIS — G919 Hydrocephalus, unspecified: Secondary | ICD-10-CM | POA: Diagnosis not present

## 2017-09-02 ENCOUNTER — Other Ambulatory Visit: Payer: Self-pay | Admitting: Cardiology

## 2017-09-03 ENCOUNTER — Ambulatory Visit (INDEPENDENT_AMBULATORY_CARE_PROVIDER_SITE_OTHER): Payer: Medicare Other

## 2017-09-03 DIAGNOSIS — Z95810 Presence of automatic (implantable) cardiac defibrillator: Secondary | ICD-10-CM

## 2017-09-03 DIAGNOSIS — I5022 Chronic systolic (congestive) heart failure: Secondary | ICD-10-CM

## 2017-09-03 NOTE — Progress Notes (Signed)
EPIC Encounter for ICM Monitoring  Patient Name: Justin Hill is a 76 y.o. male Date: 09/03/2017 Primary Care Physican: Cyndy Freeze, MD Primary Cardiologist:[place] Electrophysiologist: Allred Dry Weight:Previous weight 255 lbs  Bi-V Pacing: 96.7%  Since 04-Aug-2017 VT-NS (>4 beats, >200 bpm) 1      Spoke with wife and patient.  Heart Failure questions reviewed, pt asymptomatic .   Thoracic impedance normal.  Prescribed dosage: Torsemide 10 mg 1 tablet every other day.   Labs: 05/29/2017 Creatinine 1.32, BUN 13, Potassium 4.7, Sodium 134, EGFR 51-60 03/24/2017 Creatinine 1.47, BUN 25, Potassium 5.2, Sodium 133, EGFR 46-54 01/09/2017 Creatinine 1.42, BUN 15, Potassium 4.6, Sodium 136, EGFR 47-55 12/22/2016 Creatinine 1.56, BUN 22, Potassium 5.0, Sodium 138, EGFR 43-50 12/02/2016 Creatinine 1.20, BUN 14, Potassium 4.9, Sodium 138, EGFR >60 10/03/2016 Creatinine 1.27, BUN 16, Potassium 4.9, Sodium 135, EGFR 54->60  08/01/2016 Creatinine 1.21, BUN 16, Potassium 5.0, Sodium 138, EGFR 59-68  07/23/2016 Creatinine 1.52, BUN 19, Potassium 4.5, Sodium 136  05/29/2016 Creatinine 1.26, BUN 15, Potassium 4.6, Sodium 141  04/30/2016 Creatinine 1.17, BUN 9, Potassium 4.9, Sodium 138  12/28/2015 Creatinine 1.35, BUN 13, Potassium 4.9, Sodium 142  11/30/2016Creatinine 1.37, BUN 20, Potassium 4.3, Sodium 139   Recommendations: No changes.   Encouraged to call for fluid symptoms.  Follow-up plan: ICM clinic phone appointment on 11/03/2017.  Office appointment scheduled 09/07/2017 with Dr. Aundra Dubin and 09/21/2017 with Dr Rayann Heman  Copy of ICM check sent to Dr. Rayann Heman.   3 month ICM trend: 09/03/2017   1 Year ICM trend:      Rosalene Billings, RN 09/03/2017 2:13 PM

## 2017-09-07 ENCOUNTER — Encounter (HOSPITAL_COMMUNITY): Payer: Self-pay | Admitting: Cardiology

## 2017-09-07 ENCOUNTER — Ambulatory Visit (HOSPITAL_COMMUNITY)
Admission: RE | Admit: 2017-09-07 | Discharge: 2017-09-07 | Disposition: A | Discharge status: Home / Self Care | Payer: Medicare Other | Source: Ambulatory Visit | Attending: Cardiology | Admitting: Cardiology

## 2017-09-07 VITALS — BP 113/59 | HR 75 | Wt 260.0 lb

## 2017-09-07 DIAGNOSIS — N189 Chronic kidney disease, unspecified: Secondary | ICD-10-CM | POA: Insufficient documentation

## 2017-09-07 DIAGNOSIS — I429 Cardiomyopathy, unspecified: Secondary | ICD-10-CM | POA: Insufficient documentation

## 2017-09-07 DIAGNOSIS — I4891 Unspecified atrial fibrillation: Secondary | ICD-10-CM | POA: Insufficient documentation

## 2017-09-07 DIAGNOSIS — Z7901 Long term (current) use of anticoagulants: Secondary | ICD-10-CM | POA: Diagnosis not present

## 2017-09-07 DIAGNOSIS — I4892 Unspecified atrial flutter: Secondary | ICD-10-CM | POA: Insufficient documentation

## 2017-09-07 DIAGNOSIS — Z9889 Other specified postprocedural states: Secondary | ICD-10-CM | POA: Diagnosis not present

## 2017-09-07 DIAGNOSIS — Z794 Long term (current) use of insulin: Secondary | ICD-10-CM | POA: Diagnosis not present

## 2017-09-07 DIAGNOSIS — Z87891 Personal history of nicotine dependence: Secondary | ICD-10-CM | POA: Diagnosis not present

## 2017-09-07 DIAGNOSIS — I447 Left bundle-branch block, unspecified: Secondary | ICD-10-CM | POA: Diagnosis not present

## 2017-09-07 DIAGNOSIS — I2510 Atherosclerotic heart disease of native coronary artery without angina pectoris: Secondary | ICD-10-CM | POA: Insufficient documentation

## 2017-09-07 DIAGNOSIS — E1122 Type 2 diabetes mellitus with diabetic chronic kidney disease: Secondary | ICD-10-CM | POA: Insufficient documentation

## 2017-09-07 DIAGNOSIS — E785 Hyperlipidemia, unspecified: Secondary | ICD-10-CM | POA: Diagnosis not present

## 2017-09-07 DIAGNOSIS — Z79899 Other long term (current) drug therapy: Secondary | ICD-10-CM | POA: Insufficient documentation

## 2017-09-07 DIAGNOSIS — I480 Paroxysmal atrial fibrillation: Secondary | ICD-10-CM | POA: Diagnosis not present

## 2017-09-07 DIAGNOSIS — Z8249 Family history of ischemic heart disease and other diseases of the circulatory system: Secondary | ICD-10-CM | POA: Diagnosis not present

## 2017-09-07 DIAGNOSIS — E669 Obesity, unspecified: Secondary | ICD-10-CM | POA: Diagnosis not present

## 2017-09-07 DIAGNOSIS — G4733 Obstructive sleep apnea (adult) (pediatric): Secondary | ICD-10-CM | POA: Insufficient documentation

## 2017-09-07 DIAGNOSIS — I5022 Chronic systolic (congestive) heart failure: Secondary | ICD-10-CM | POA: Diagnosis not present

## 2017-09-07 DIAGNOSIS — I130 Hypertensive heart and chronic kidney disease with heart failure and stage 1 through stage 4 chronic kidney disease, or unspecified chronic kidney disease: Secondary | ICD-10-CM | POA: Diagnosis not present

## 2017-09-07 DIAGNOSIS — Z823 Family history of stroke: Secondary | ICD-10-CM | POA: Diagnosis not present

## 2017-09-07 DIAGNOSIS — R9431 Abnormal electrocardiogram [ECG] [EKG]: Secondary | ICD-10-CM | POA: Insufficient documentation

## 2017-09-07 LAB — DIGOXIN LEVEL: Digoxin Level: 0.3 ng/mL — ABNORMAL LOW (ref 0.8–2.0)

## 2017-09-07 NOTE — Progress Notes (Signed)
Medication Samples have been provided to the patient.  Drug name: Lennette Bihari       Strength: 20 mg        Qty: 1  LOT: [id number]  Exp.Date: 12/20  Dosing instructions: Take 1 tab daily at supper time   The patient has been instructed regarding the correct time, dose, and frequency of taking this medication, including desired effects and most common side effects.   Shirley Muscat 3:17 PM 09/07/2017

## 2017-09-07 NOTE — Patient Instructions (Signed)
Labs drawn today (if we do not call you, then your lab work was stable)   Your physician recommends that you schedule a follow-up appointment in: 4 months

## 2017-09-08 NOTE — Progress Notes (Signed)
Patient ID: Justin Hill, male   DOB: Feb 21, 1941, 76 y.o.   MRN: [id number] PCP: Dr. Camillia Herter Cardiology: Dr. Aundra Dubin  76 yo with history CKD, HTN, diabetes, chronic LBBB, and nonischemic cardiomyopathy presents for followup of CHF.  His cardiomyopathy has been known for years.  He had a LHC in 2008 showing mild nonobstructive CAD.     Justin Hill was admitted in 2/16 with atrial flutter degenerating into atrial fibrillation.  He was initially cardioverted, then atrial flutter recurred.  He had atrial flutter ablation by Dr Rayann Heman in 2/16.  He thinks that he has been in NSR since that time, no further palpitations. He is on Xarelto now with no melena or BRBPR.    He is s/p placement of Medtronic CRT-D device.  Echo in 1/18 showed improvement in EF to 40-45%.    [name].  His gait is now shuffling. There is some concern for normal pressure hydrocephalus and he has been referred to a neurosurgeon.  Per his wife (who gives most of history), he is slowing down.  Not very active though he denies significant exertional dyspnea or chest pain. Weight is up 10 lbs.  No orthopnea/PND.    Optivol (10/18): Not volume overloaded, stable impedance.   Labs (5/14): K 4, creatinine 1.4, LFTs normal, LDL 46, HDL 37 Labs (1/15): K 4.4, creatinine 1.46, LDL 74, HDL 35 Labs (4/15); LDL 96, HDL 44, K 5.2, creatinine 1.4 Labs (1/16): K 4.9, creatinine 1.36 Labs (2/16): K 4.5, creatinine 1.56, BNP 412 Labs (9/16): K 4.8, creatinine 1.33, hgb 14.7 Labs (11/16): K 4.3, creatinine 1.37, BNP 52 Labs (1/17): K 4.5 => 5, creatinine 1.35 => 1.17, HCT 47.4 Labs (2/17): K 4.9, creatinine 1.35 Labs (5/17): LDL 48, LFTs normal Labs (9/17): K 5, creatinine 1.21 Labs (11/17): HCT 41.4, digoxin 0.3, K 4.9, creatinine 1.27 Labs (5/18): K 5.2, creatinine 1.47 Labs (7/18): digoxin 0.3 Labs (9/18): LDL 82, HDL 49, K 4.5, creatinine 1.34  PMH: 1. Type II diabetes with peripheral  neuropathy.  2. CKD 3. Hyperlipidemia 4. HTN: ACEI cough.  5. Nonischemic cardiomyopathy: This was diagnosed prior to 2008.  In 2008, patient had LHC with only mild nonobstructive coronary disease.  Echo in 1/14 showed EF 25-30% with normal RV.  Patient has not tolerated ACEI due to hyperkalemia and AKI. ICD was discussed (Dr Lovena Le) and decided against given NYHA class I symptoms.  Echo (7/15) with EF 25-30%, septal-lateral dyssynchrony, mild LV dilation. Echo (6/16) with EF 25-30%, mild LVH, septal-lateral dyssynchrony, normal RV size and systolic function. CPX (12/16) with peak VO2 14.5 (69% predicted), VE/VCO2 34, RER 1.19 => mild functional impairment from heart failure, restrictive lung function likely due to body habitus.  - LHC/RHC (6/17): 30% mid LAD stenosis; mean RA 5, PA 33/10, PCWP mean 16, CI 1.95.  - Medtronic CRT-D placed 7/17.  - Echo (1/18): EF 40-45%, mild LVH.  6. Chronic LBBB 7. Obesity 8. Atrial flutter and atrial fibrillation: S/p atrial flutter ablation in 2/16 (Allred).  9. Memory difficulty: Possible normal pressure hydrocephalus. 10. OSA: Uses CPAP.   SH: Lives in [place], married, prior smoker, no ETOH.  1 child.   FH: Grandmother with CHF.  Mother with CVA.   ROS: All systems reviewed and negative except as per HPI.    Current Outpatient Prescriptions  Medication Sig Dispense Refill  . Ascorbic Acid (VITAMIN C) 500 MG CAPS Take 1 capsule by mouth 2 (two) times daily.     Marland Kitchen  Bilberry, Vaccinium myrtillus, (BILBERRY PO) Take by mouth.    . carvedilol (COREG) 25 MG tablet Take 25 mg by mouth 2 (two) times daily with a meal.    . digoxin (LANOXIN) 0.125 MG tablet TAKE ONE TABLET BY MOUTH ONCE DAILY 90 tablet 3  . ENTRESTO 97-103 MG TAKE ONE TABLET BY MOUTH TWICE DAILY 180 tablet 3  . fenofibrate 54 MG tablet Take 1 tablet (54 mg total) by mouth daily. 30 tablet 3  . insulin NPH Human (NOVOLIN N) 100 UNIT/ML injection Inject 1.1 mLs (110 Units total) into the skin  every morning. 40 mL 11  . insulin NPH-regular Human (NOVOLIN 70/30) (70-30) 100 UNIT/ML injection Inject 60 Units into the skin daily with supper. 20 mL 11  . Loratadine (CLARITIN PO) Take by mouth 2 (two) times daily.    . Multiple Vitamin (MULTIVITAMIN) tablet Take 1 tablet by mouth daily.      . simvastatin (ZOCOR) 40 MG tablet Take 40 mg by mouth at bedtime.      . tamsulosin (FLOMAX) 0.4 MG CAPS capsule Take 0.4 mg by mouth daily.     Marland Kitchen torsemide (DEMADEX) 10 MG tablet Take 1 tablet (10 mg total) by mouth every other day. 30 tablet 3  . XARELTO 20 MG TABS tablet TAKE ONE TABLET BY MOUTH ONCE DAILY WITH  SUPPER 90 tablet 3   No current facility-administered medications for this encounter.     BP (!) 113/59   Pulse 75   Wt 260 lb (117.9 kg)   SpO2 98%   BMI 36.26 kg/m  General: NAD, obese Neck: No JVD, no thyromegaly or thyroid nodule.  Lungs: Clear to auscultation bilaterally with normal respiratory effort. CV: Nondisplaced PMI.  Heart regular S1/S2, no S3/S4, no murmur.  1+ ankle edema.  No carotid bruit.  Normal pedal pulses.  Abdomen: Soft, nontender, no hepatosplenomegaly, no distention.  Skin: Intact without lesions or rashes.  Neurologic: Alert and oriented x 3.  Psych: Normal affect. Extremities: No clubbing or cyanosis.  HEENT: Normal.   Assessment/Plan: 1. Cardiomyopathy: Nonischemic.  Low EF x years. Echo 6/16 showed stable EF 25-30% with septal-lateral dyssynchrony.  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  Interestingly, it also appears that he has a long-standing LBBB.  A chronic LBBB itself can potentially cause a cardiomyopathy and may be the source of his low EF.   Coronary angiography in 6/17 with nonobstructive disease. Now s/p Medtronic CRT-D device placement, feels better.  Repeat echo in 1/18 with increase in EF to 40-45%.  NYHA class II symptoms, not volume overload by exam or Optivol. He is less  active and I think that the 10 lb weight gain is caloric.  - Continue Coreg, he is at goal dose. - Continue Entresto 97/103 bid.   - K has been high with spironolactone so now off.   - Continue torsemide 10 mg every other day.    - Continue digoxin, check level today.  2. Hyperlipidemia: Patient is on simvastatin.  9/18 lipids ok.  3. CKD: BMET today.  4. Atrial flutter: s/p ablation, in NSR today.  5. Atrial fibrillation: Patient had afib noted as well as flutter.  Therefore, I think that he should stay on Xarelto long-term.  NSR today.  6. Memory difficulty: Working up for normal pressure hydrocephalus.  7. OSA: Continue to use CPAP.   Followup in 4 months.  Loralie Champagne 09/08/2017

## 2017-09-21 ENCOUNTER — Encounter: Payer: Self-pay | Admitting: Internal Medicine

## 2017-09-21 ENCOUNTER — Ambulatory Visit (INDEPENDENT_AMBULATORY_CARE_PROVIDER_SITE_OTHER): Payer: Medicare Other | Admitting: Internal Medicine

## 2017-09-21 VITALS — BP 110/60 | HR 81 | Ht 71.0 in | Wt 261.0 lb

## 2017-09-21 DIAGNOSIS — I480 Paroxysmal atrial fibrillation: Secondary | ICD-10-CM

## 2017-09-21 DIAGNOSIS — I5022 Chronic systolic (congestive) heart failure: Secondary | ICD-10-CM | POA: Diagnosis not present

## 2017-09-21 LAB — CUP PACEART INCLINIC DEVICE CHECK
Battery Voltage: 2.98 V
Brady Statistic AP VS Percent: 0.09 %
Brady Statistic RA Percent Paced: 3.96 %
Brady Statistic RV Percent Paced: 10.11 %
HighPow Impedance: 87 Ohm
Implantable Lead Implant Date: 20170713
Implantable Lead Implant Date: 20170713
Implantable Lead Location: 753858
Implantable Lead Location: 753860
Implantable Pulse Generator Implant Date: 20170713
Lead Channel Impedance Value: 399 Ohm
Lead Channel Impedance Value: 418 Ohm
Lead Channel Impedance Value: 475 Ohm
Lead Channel Impedance Value: 475 Ohm
Lead Channel Impedance Value: 551 Ohm
Lead Channel Impedance Value: 722 Ohm
Lead Channel Pacing Threshold Amplitude: 0.5 V
Lead Channel Pacing Threshold Pulse Width: 0.4 ms
Lead Channel Pacing Threshold Pulse Width: 0.4 ms
Lead Channel Sensing Intrinsic Amplitude: 0.875 mV
Lead Channel Sensing Intrinsic Amplitude: 0.875 mV
Lead Channel Sensing Intrinsic Amplitude: 12.125 mV
Lead Channel Sensing Intrinsic Amplitude: 12.875 mV
Lead Channel Setting Pacing Amplitude: 2 V
Lead Channel Setting Pacing Amplitude: 4 V
Lead Channel Setting Pacing Pulse Width: 0.8 ms
MDC IDC LEAD IMPLANT DT: 20170713
MDC IDC LEAD LOCATION: 753859
MDC IDC MSMT BATTERY REMAINING LONGEVITY: 60 mo
MDC IDC MSMT LEADCHNL LV IMPEDANCE VALUE: 589 Ohm
MDC IDC MSMT LEADCHNL LV IMPEDANCE VALUE: 665 Ohm
MDC IDC MSMT LEADCHNL LV IMPEDANCE VALUE: 836 Ohm
MDC IDC MSMT LEADCHNL LV IMPEDANCE VALUE: 950 Ohm
MDC IDC MSMT LEADCHNL LV IMPEDANCE VALUE: 988 Ohm
MDC IDC MSMT LEADCHNL LV PACING THRESHOLD AMPLITUDE: 3 V
MDC IDC MSMT LEADCHNL LV PACING THRESHOLD PULSEWIDTH: 0.8 ms
MDC IDC MSMT LEADCHNL RA PACING THRESHOLD AMPLITUDE: 0.5 V
MDC IDC MSMT LEADCHNL RV IMPEDANCE VALUE: 456 Ohm
MDC IDC MSMT LEADCHNL RV IMPEDANCE VALUE: 532 Ohm
MDC IDC SESS DTM: 20181029165818
MDC IDC SET LEADCHNL RV PACING AMPLITUDE: 2.5 V
MDC IDC SET LEADCHNL RV PACING PULSEWIDTH: 0.4 ms
MDC IDC SET LEADCHNL RV SENSING SENSITIVITY: 0.3 mV
MDC IDC STAT BRADY AP VP PERCENT: 3.94 %
MDC IDC STAT BRADY AS VP PERCENT: 94.56 %
MDC IDC STAT BRADY AS VS PERCENT: 1.4 %

## 2017-09-21 NOTE — Patient Instructions (Signed)
Medication Instructions:  Your physician recommends that you continue on your current medications as directed. Please refer to the Current Medication list given to you today.  -- If you need a refill on your cardiac medications before your next appointment, please call your pharmacy. --  Labwork: None ordered  Testing/Procedures: None ordered  Follow-Up: Your physician wants you to follow-up in: 1 year with Chanetta Marshall NP.  You will receive a reminder letter in the mail two months in advance. If you don't receive a letter, please call our office to schedule the follow-up appointment.  Remote monitoring is used to monitor your ICD from home. This monitoring reduces the number of office visits required to check your device to one time per year. It allows [place] to keep an eye on the functioning of your device to ensure it is working properly. You are scheduled for a device check from home on 11/03/2017. You may send your transmission at any time that day. If you have a wireless device, the transmission will be sent automatically. After your physician reviews your transmission, you will receive a postcard with your next transmission date.    Thank you for choosing [facility]!!   Frederik Schmidt, RN [phone]  Any Other Special Instructions Will Be Listed Below (If Applicable).

## 2017-09-21 NOTE — Progress Notes (Signed)
PCP: Cyndy Freeze, MD Primary Cardiologist: Primary EP: Justin Hill is a 76 y.o. male who presents today for routine electrophysiology followup.  Since last being seen in our clinic, the patient reports doing reasonably well.  His wife reports that his memory is declining.  He also has shuffling gait now.  Today, he denies symptoms of palpitations, chest pain, shortness of breath,  lower extremity edema, dizziness, presyncope, syncope, or ICD shocks.  The patient is otherwise without complaint today.   Past Medical History:  Diagnosis Date  . Altered mental status   . Cardiomyopathy   . CHF (congestive heart failure) ([place])   . Chronic kidney disease (CKD), stage III (moderate) ([facility])   . Communicating hydrocephalus   . Diabetes mellitus   . Diverticulosis   . Hypercholesterolemia   . Hyperglycemia   . Hypertension   . Paroxysmal atrial fibrillation ([facility])   . Personal history of kidney stones    Past Surgical History:  Procedure Laterality Date  . ATRIAL FLUTTER ABLATION N/A 01/19/2015   Procedure: ATRIAL FLUTTER ABLATION;  Surgeon: Justin Grayer, MD;  Location: [facility] CATH LAB;  Service: Cardiovascular;  Laterality: N/A;  . CARDIAC CATHETERIZATION N/A 05/02/2016   Procedure: Right/Left Heart Cath and Coronary Angiography;  Surgeon: Larey Dresser, MD;  Location: [place] CV LAB;  Service: Cardiovascular;  Laterality: N/A;  . COLONOSCOPY N/A 12/29/2013   Procedure: COLONOSCOPY;  Surgeon: Lafayette Dragon, MD;  Location: [facility] ENDOSCOPY;  Service: Endoscopy;  Laterality: N/A;  . EP IMPLANTABLE DEVICE N/A 06/05/2016   MDT Hillery Aldo XT CRTD implanted by Justin Rayann Heman   . [place]   left. Muscle reattachment  . TOE SURGERY Right 12/2012   2nd toe  . TONSILLECTOMY      ROS- all systems are reviewed and negative except as per HPI above  Current Outpatient Prescriptions  Medication Sig Dispense Refill  . Ascorbic Acid (VITAMIN C) 500 MG CAPS Take 1 capsule by  mouth 2 (two) times daily.     . carvedilol (COREG) 25 MG tablet Take 25 mg by mouth 2 (two) times daily with a meal.    . digoxin (LANOXIN) 0.125 MG tablet TAKE ONE TABLET BY MOUTH ONCE DAILY 90 tablet 3  . ENTRESTO 97-103 MG TAKE ONE TABLET BY MOUTH TWICE DAILY 180 tablet 3  . fenofibrate 54 MG tablet Take 1 tablet (54 mg total) by mouth daily. 30 tablet 3  . insulin NPH Human (NOVOLIN N) 100 UNIT/ML injection Inject 1.1 mLs (110 Units total) into the skin every morning. 40 mL 11  . insulin NPH-regular Human (NOVOLIN 70/30) (70-30) 100 UNIT/ML injection Inject 60 Units into the skin daily with supper. 20 mL 11  . Loratadine (CLARITIN PO) Take 1 tablet by mouth 2 (two) times daily.     . Multiple Vitamin (MULTIVITAMIN) tablet Take 1 tablet by mouth daily.      . simvastatin (ZOCOR) 40 MG tablet Take 40 mg by mouth at bedtime.      . tamsulosin (FLOMAX) 0.4 MG CAPS capsule Take 0.4 mg by mouth daily.     Marland Kitchen torsemide (DEMADEX) 10 MG tablet Take 1 tablet (10 mg total) by mouth every other day. 30 tablet 3  . XARELTO 20 MG TABS tablet TAKE ONE TABLET BY MOUTH ONCE DAILY WITH  SUPPER 90 tablet 3   No current facility-administered medications for this visit.     Physical Exam: Vitals:   09/21/17 1519  BP: (!) 98/46  Pulse: 81  SpO2: 93%  Weight: 261 lb (118.4 kg)  Height: 5\' 11"  (1.803 m)    GEN- The patient is overweight appearing, alert and oriented x 3 today.   Head- normocephalic, atraumatic Eyes-  Sclera clear, conjunctiva pink Ears- hearing intact Oropharynx- clear Lungs- Clear to ausculation bilaterally, normal work of breathing Chest- ICD pocket is well healed Heart- Regular rate and rhythm, no murmurs, rubs or gallops, PMI not laterally displaced GI- soft, NT, ND, + BS Extremities- no clubbing, cyanosis, or edema  ICD interrogation- reviewed in detail today,  See PACEART report  ekg tracing  09/07/17 is personally reviewed and reveals sinus rhythm with BiV  Pacing  Assessment and Plan:  1.  Chronic systolic dysfunction euvolemic today Stable on an appropriate medical regimen Normal ICD function See Pace Art report No changes today EF has improved to 45% with CRT Followed by Sharman Cheek in [facility] device clinic  2. Paroxysmal atrial fibrillation Well controlled (AF burden is 0.3 %) Continue xarelto  3. OSA On CPAP  [facility] Return to see EP NP in a year  Justin Grayer MD, [facility] 09/21/2017 3:33 PM

## 2017-09-28 DIAGNOSIS — M2042 Other hammer toe(s) (acquired), left foot: Secondary | ICD-10-CM | POA: Diagnosis not present

## 2017-09-28 DIAGNOSIS — L84 Corns and callosities: Secondary | ICD-10-CM | POA: Diagnosis not present

## 2017-09-28 DIAGNOSIS — E1142 Type 2 diabetes mellitus with diabetic polyneuropathy: Secondary | ICD-10-CM | POA: Diagnosis not present

## 2017-09-28 DIAGNOSIS — L603 Nail dystrophy: Secondary | ICD-10-CM | POA: Diagnosis not present

## 2017-09-28 DIAGNOSIS — M2041 Other hammer toe(s) (acquired), right foot: Secondary | ICD-10-CM | POA: Diagnosis not present

## 2017-09-29 ENCOUNTER — Ambulatory Visit: Payer: Medicare Other | Admitting: Adult Health

## 2017-09-29 DIAGNOSIS — G912 (Idiopathic) normal pressure hydrocephalus: Secondary | ICD-10-CM | POA: Diagnosis not present

## 2017-09-29 DIAGNOSIS — Z6836 Body mass index (BMI) 36.0-36.9, adult: Secondary | ICD-10-CM | POA: Diagnosis not present

## 2017-10-01 DIAGNOSIS — N183 Chronic kidney disease, stage 3 (moderate): Secondary | ICD-10-CM | POA: Diagnosis not present

## 2017-10-01 DIAGNOSIS — G919 Hydrocephalus, unspecified: Secondary | ICD-10-CM | POA: Diagnosis not present

## 2017-10-01 DIAGNOSIS — E782 Mixed hyperlipidemia: Secondary | ICD-10-CM | POA: Diagnosis not present

## 2017-10-01 DIAGNOSIS — N401 Enlarged prostate with lower urinary tract symptoms: Secondary | ICD-10-CM | POA: Diagnosis not present

## 2017-10-01 DIAGNOSIS — I5022 Chronic systolic (congestive) heart failure: Secondary | ICD-10-CM | POA: Diagnosis not present

## 2017-10-01 DIAGNOSIS — Z0000 Encounter for general adult medical examination without abnormal findings: Secondary | ICD-10-CM | POA: Diagnosis not present

## 2017-10-02 ENCOUNTER — Other Ambulatory Visit: Payer: Self-pay | Admitting: Neurosurgery

## 2017-10-04 ENCOUNTER — Encounter: Payer: Self-pay | Admitting: Adult Health

## 2017-10-05 ENCOUNTER — Encounter: Payer: Self-pay | Admitting: Adult Health

## 2017-10-05 ENCOUNTER — Ambulatory Visit (INDEPENDENT_AMBULATORY_CARE_PROVIDER_SITE_OTHER): Payer: Medicare Other | Admitting: Adult Health

## 2017-10-05 ENCOUNTER — Telehealth: Payer: Self-pay

## 2017-10-05 VITALS — BP 106/64 | HR 86 | Wt 263.0 lb

## 2017-10-05 DIAGNOSIS — R413 Other amnesia: Secondary | ICD-10-CM | POA: Diagnosis not present

## 2017-10-05 DIAGNOSIS — Z9989 Dependence on other enabling machines and devices: Secondary | ICD-10-CM | POA: Diagnosis not present

## 2017-10-05 DIAGNOSIS — G4733 Obstructive sleep apnea (adult) (pediatric): Secondary | ICD-10-CM | POA: Diagnosis not present

## 2017-10-05 NOTE — Telephone Encounter (Signed)
OK to hold Xarelto for VP shunt.

## 2017-10-05 NOTE — Telephone Encounter (Signed)
    Chart reviewed as part of pre-operative coverage.  Patient is followed in CHF clinic. Per our protocol, these clearances are handled directly by the Advanced Heart Failure team. Will forward to Kevan Rosebush, RN, to review with primary cardiologist. Encounter will be removed from APP preop pool.   Charlie Pitter, PA-C  10/05/2017, 3:48 PM

## 2017-10-05 NOTE — Progress Notes (Addendum)
PATIENT: Justin Hill DOB: [birth year]  REASON FOR VISIT: follow up- osa on cpap, memory HISTORY FROM: patient  HISTORY OF PRESENT ILLNESS: Today 10/05/17   Justin Hill is a 76 year old male with a history of obstructive sleep apnea on CPAP and memory disturbance.  He returns today for follow-up.  His CPAP download indicates that he use his machine 30 out of 30 days for compliance of 100%.  He uses machine greater than 4 hours each night.  On average he uses his machine 9 hours and 20 minutes.  His residual AHI is 6.1 on 8 cm of water with EPR of 3.  The patient does have a leak in the 95th percentile at 26.8 L/min.  His wife states that occasionally he will pull the mask off while he is sleeping.  She does instruct him to put it back on.  She feels that his memory has gotten worse.  They have seen their primary care who repeated a CT scan of the head.  According to the wife it revealed  hydrocephalus.  He was sent to neurosurgery where he saw Dr. Arnoldo Morale.  He was diagnosed with normal pressure hydrocephalus and was scheduled to have a shunt placed in December.  Patient returns today for an evaluation.  HISTORY  05/28/2017 [facility]): I reviewed his CPAP compliance data from 04/28/2017 through 05/27/2017, which is a total of 30 days, during which time he used his machine every night with percent used days greater than 4 hours at 97%, indicating excellent compliance with an average usage of 9 hours and 23 minutes, residual AHI suboptimal at 8.7 per hour, leak on the high side with the 95th percentile at 23.6 L/m on a pressure of 7 cm with EPR of 3. He reports doing okay, sleeps better, still some nocturia, but better per wife. He is using a nasal pillows interface, did not do well with the FFM, per wife. Memory stable, maybe initially after CPAP start, but plateaued. Blood sugar still high, in 200s for fasting at home. Has appt with Dr. Loanne Drilling and sees the cardiologist too next week. Some mornings  he is slow to rise, grumpy, some are better. Going on a cruise soon.    The patient's allergies, current medications, family history, past medical history, past social history, past surgical history and problem list were reviewed and updated as appropriate.   REVIEW OF SYSTEMS: Out of a complete 14 system review of symptoms, the patient complains only of the following symptoms, and all other reviewed systems are negative. See HPI  ALLERGIES: Allergies  Allergen Reactions  . Aspirin Other (See Comments)    Burps, burning, stomach pains, etc  . Lisinopril Cough    Severe coughing  . Penicillins Other (See Comments)    syncope    HOME MEDICATIONS: Outpatient Medications Prior to Visit  Medication Sig Dispense Refill  . Ascorbic Acid (VITAMIN C) 500 MG CAPS Take 1 capsule by mouth 2 (two) times daily.     . carvedilol (COREG) 25 MG tablet Take 25 mg by mouth 2 (two) times daily with a meal.    . digoxin (LANOXIN) 0.125 MG tablet TAKE ONE TABLET BY MOUTH ONCE DAILY 90 tablet 3  . ENTRESTO 97-103 MG TAKE ONE TABLET BY MOUTH TWICE DAILY 180 tablet 3  . fenofibrate 54 MG tablet Take 1 tablet (54 mg total) by mouth daily. 30 tablet 3  . insulin NPH Human (NOVOLIN N) 100 UNIT/ML injection Inject 1.1 mLs (110 Units total)  into the skin every morning. 40 mL 11  . insulin NPH-regular Human (NOVOLIN 70/30) (70-30) 100 UNIT/ML injection Inject 60 Units into the skin daily with supper. 20 mL 11  . Loratadine (CLARITIN PO) Take 1 tablet by mouth 2 (two) times daily.     . Multiple Vitamin (MULTIVITAMIN) tablet Take 1 tablet by mouth daily.      . simvastatin (ZOCOR) 40 MG tablet Take 40 mg by mouth at bedtime.      . tamsulosin (FLOMAX) 0.4 MG CAPS capsule Take 0.4 mg by mouth daily.     Marland Kitchen torsemide (DEMADEX) 10 MG tablet Take 1 tablet (10 mg total) by mouth every other day. 30 tablet 3  . XARELTO 20 MG TABS tablet TAKE ONE TABLET BY MOUTH ONCE DAILY WITH  SUPPER 90 tablet 3   No  facility-administered medications prior to visit.     PAST MEDICAL HISTORY: Past Medical History:  Diagnosis Date  . Altered mental status   . Cardiomyopathy   . CHF (congestive heart failure) ([place])   . Chronic kidney disease (CKD), stage III (moderate) (HCC)   . Communicating hydrocephalus   . Diabetes mellitus   . Diverticulosis   . Hypercholesterolemia   . Hyperglycemia   . Hypertension   . Paroxysmal atrial fibrillation (HCC)   . Personal history of kidney stones     PAST SURGICAL HISTORY: Past Surgical History:  Procedure Laterality Date  . [place]   left. Muscle reattachment  . TOE SURGERY Right 12/2012   2nd toe  . TONSILLECTOMY      FAMILY HISTORY: Family History  Problem Relation Age of Onset  . Heart failure Maternal Grandmother   . Diabetes type II Sister 43  . Cancer Sister   . Hyperlipidemia Other        Parent  . Stroke Other        Parent  . Hypertension Other        Parent  . Diabetes Other        Parent  . Colon cancer Neg Hx   . Heart attack Neg Hx     SOCIAL HISTORY: Social History   Socioeconomic History  . Marital status: Married    Spouse name: Rosemarie Beath  . Number of children: 1  . Years of education: 51  . Highest education level: Not on file  Social Needs  . Financial resource strain: Not on file  . Food insecurity - worry: Not on file  . Food insecurity - inability: Not on file  . Transportation needs - medical: Not on file  . Transportation needs - non-medical: Not on file  Occupational History  . Occupation: retired  Tobacco Use  . Smoking status: Former Smoker    Types: Cigarettes  . Smokeless tobacco: Never Used  Substance and Sexual Activity  . Alcohol use: No  . Drug use: No  . Sexual activity: Not on file  Other Topics Concern  . Not on file  Social History Narrative   Regular exercise-no   Rare caffeine use       PHYSICAL EXAM  Vitals:   10/05/17 0951  BP: 106/64  Pulse: 86  Weight:  263 lb (119.3 kg)   Body mass index is 36.68 kg/m.  MMSE - Mini Mental State Exam 10/05/2017 03/25/2017  Orientation to time 4 2  Orientation to Place 4 3  Registration 3 3  Attention/ Calculation 1 4  Recall 1 2  Language- name 2 objects  2 2  Language- repeat 0 1  Language- follow 3 step command 3 2  Language- read & follow direction 1 1  Write a sentence 1 1  Copy design 0 1  Total score 20 22     Generalized: Well developed, in no acute distress   Neurological examination  Mentation: Alert. Follows all commands speech and language fluent Cranial nerve II-XII: Pupils were equal round reactive to light. Extraocular movements were full, visual field were full on confrontational test. Facial sensation and strength were normal. Uvula tongue midline. Head turning and shoulder shrug  were normal and symmetric. Motor: The motor testing reveals 5 over 5 strength of all 4 extremities. Good symmetric motor tone is noted throughout.  Sensory: Sensory testing is intact to soft touch on all 4 extremities. No evidence of extinction is noted.  Coordination: Cerebellar testing reveals good finger-nose-finger and heel-to-shin bilaterally.  Gait and station: Patient's gait is slightly unsteady.  Patient has decreased stride.  Tandem gait not attempted. Reflexes: Deep tendon reflexes are symmetric and normal bilaterally.   DIAGNOSTIC DATA (LABS, IMAGING, TESTING) - I reviewed patient records, labs, notes, testing and imaging myself where available.  Lab Results  Component Value Date   WBC 6.5 05/29/2017   HGB 12.8 (L) 05/29/2017   HCT 39.1 05/29/2017   MCV 89.5 05/29/2017   PLT 122 (L) 05/29/2017      Component Value Date/Time   NA 134 (L) 05/29/2017 1346   NA 133 (L) 03/24/2017 1343   K 4.7 05/29/2017 1346   CL 99 (L) 05/29/2017 1346   CO2 26 05/29/2017 1346   GLUCOSE 314 (H) 05/29/2017 1346   BUN 13 05/29/2017 1346   BUN 25 03/24/2017 1343   CREATININE 1.32 (H) 05/29/2017 1346    CREATININE 1.52 (H) 07/23/2016 0930   CALCIUM 8.8 (L) 05/29/2017 1346   PROT 6.6 08/03/2017 0959   ALBUMIN 4.3 08/03/2017 0959   AST 19 08/03/2017 0959   ALT 20 08/03/2017 0959   ALKPHOS 47 08/03/2017 0959   BILITOT 0.3 08/03/2017 0959   GFRNONAA 51 (L) 05/29/2017 1346   GFRAA 60 (L) 05/29/2017 1346   Lab Results  Component Value Date   CHOL 173 08/03/2017   HDL 49 08/03/2017   LDLCALC 82 08/03/2017   TRIG 208 (H) 08/03/2017   CHOLHDL 3.6 05/29/2017   Lab Results  Component Value Date   HGBA1C 8.5 08/03/2017   Lab Results  Component Value Date   VITAMINB12 279 02/05/2017   Lab Results  Component Value Date   TSH 1.580 02/05/2017      ASSESSMENT AND PLAN 76 y.o. year old male  has a past medical history of Altered mental status, Cardiomyopathy, CHF (congestive heart failure) ([place]), Chronic kidney disease (CKD), stage III (moderate) ([place]), Communicating hydrocephalus, Diabetes mellitus, Diverticulosis, Hypercholesterolemia, Hyperglycemia, Hypertension, Paroxysmal atrial fibrillation ([place]), and Personal history of kidney stones. here with:  1.  Obstructive sleep apnea on CPAP 2.  Memory disturbance  The patient CPAP download shows good compliance.  The patient's residual AHI is slightly elevated at 6.1.  This could be potentially due to his leak.  His wife states that she will make sure his straps are tight and encouraged him to leave the mask on at night.  In the future we may need to increase his pressure.  I did advise the patient and his wife that his memory disturbance could be due to normal pressure hydrocephalus.  They voiced understanding.  We will continue to monitor.  He will follow-up in 6 months with Dr. Rexene Alberts.  Ward Givens, MSN, NP-C 10/05/2017, 10:04 AM [facility] [address] [place], [place] [address] [phone]  I reviewed the above note and documentation by the Nurse Practitioner and agree with the history, physical exam,  assessment and plan as outlined above. I was immediately available for face-to-face consultation. Star Age, MD, PhD [facility])

## 2017-10-05 NOTE — Patient Instructions (Signed)
Your Plan:  Continue using CPAP nightly  Try to keep mask on If your symptoms worsen or you develop new symptoms please let us know.   Thank you for coming to see [place] at [facility]. I hope we have been able to provide you high quality care today.  You may receive a patient satisfaction survey over the next few weeks. We would appreciate your feedback and comments so that we may continue to improve ourselves and the health of our patients.

## 2017-10-05 NOTE — Telephone Encounter (Signed)
   [facility] Pre-operative Risk Assessment    Request for surgical clearance:  1. What type of surgery is being performed? VP shunt replacement  2. When is this surgery scheduled? 11-05-17   3. Are there any medications that need to be held prior to surgery and how long?Xarelto 5 days prior   4. Practice name and name of physician performing surgery? Dr Newman Pies at [facility]   5. What is your office phone and fax number? Phone: [phone] ext [phone] Fax: [phone]  6. Anesthesia type (None, local, MAC, general) ? Unspecified    ________________________________________   (provider comments below)

## 2017-10-05 NOTE — Telephone Encounter (Signed)
Pt sch for VP shunt replacement on 11/05/17 with Dr Arnoldo Morale and needs clearance for surgery and clearance to be off Xarelto 5 days prior.  Will send to Dr Aundra Dubin for review.

## 2017-10-06 NOTE — Telephone Encounter (Signed)
Note faxed to [facility] at [phone]

## 2017-10-07 ENCOUNTER — Ambulatory Visit: Payer: Medicare Other | Admitting: Endocrinology

## 2017-10-08 ENCOUNTER — Other Ambulatory Visit (HOSPITAL_COMMUNITY): Payer: Self-pay | Admitting: Cardiology

## 2017-10-19 DIAGNOSIS — L820 Inflamed seborrheic keratosis: Secondary | ICD-10-CM | POA: Diagnosis not present

## 2017-10-28 ENCOUNTER — Encounter: Payer: Self-pay | Admitting: Endocrinology

## 2017-10-28 ENCOUNTER — Ambulatory Visit (INDEPENDENT_AMBULATORY_CARE_PROVIDER_SITE_OTHER): Payer: Medicare Other | Admitting: Endocrinology

## 2017-10-28 VITALS — BP 142/60 | HR 84 | Wt 262.2 lb

## 2017-10-28 DIAGNOSIS — E1122 Type 2 diabetes mellitus with diabetic chronic kidney disease: Secondary | ICD-10-CM | POA: Diagnosis not present

## 2017-10-28 DIAGNOSIS — N189 Chronic kidney disease, unspecified: Secondary | ICD-10-CM | POA: Diagnosis not present

## 2017-10-28 DIAGNOSIS — Z794 Long term (current) use of insulin: Secondary | ICD-10-CM

## 2017-10-28 DIAGNOSIS — N183 Chronic kidney disease, stage 3 (moderate): Principal | ICD-10-CM

## 2017-10-28 LAB — POCT GLYCOSYLATED HEMOGLOBIN (HGB A1C): Hemoglobin A1C: 9.3

## 2017-10-28 MED ORDER — INSULIN NPH ISOPHANE & REGULAR (70-30) 100 UNIT/ML ~~LOC~~ SUSP: SUBCUTANEOUS | 11 refills | Status: DC

## 2017-10-28 NOTE — Patient Instructions (Addendum)
Please change the insulin to 70/30 only.  Take 90 units with breakfast, and 60 units with supper.  This is probably not enough, so Please call or message [place] next week, to tell us how the blood sugar is doing.   On this type of insulin schedule, you should eat meals on a regular schedule.  If a meal is missed or significantly delayed, your blood sugar could go low.    check your blood sugar twice a day.  vary the time of day when you check, between before the 3 meals, and at bedtime.  also check if you have symptoms of your blood sugar being too high or too low.  please keep a record of the readings and bring it to your next appointment here.  please call us sooner if your blood sugar goes below 70, or if you have a lot of readings over 200.   If you have any blood sugar under 80, please write on the paper why you think that was.   Please come back for a follow-up appointment in 2 months.

## 2017-10-28 NOTE — Progress Notes (Signed)
Subjective:    Patient ID: Justin Hill, male    DOB: Jan 29, 1941, 76 y.o.   MRN: [id number]  HPI Pt returns for f/u of diabetes mellitus: DM type: Insulin-requiring type 2. Dx'ed: 0254 Complications: CHF, toe ulcer, partial toe amputation, polyneuropathy, and renal insufficiency.  Therapy: insulin since 2012.  DKA: never.   Severe hypoglycemia: never.    Pancreatitis: never.   Other: he takes human insulin for cost reasons; in October of 2014, he was changed from multiple daily injections to BID, due to persistently high a1c.  The pattern of cbg's indicates he needs NPH in AM and 70/30 with dinner; wife gives insulin, due to memory loss Interval history: no cbg record, but states cbg's vary from 148-284.  It is highest at lunch, and lowest fasting.  pt states he feels well in general.  No recent steroids.   Past Medical History:  Diagnosis Date  . Altered mental status   . Cardiomyopathy   . CHF (congestive heart failure) ([place])   . Chronic kidney disease (CKD), stage III (moderate) (HCC)   . Communicating hydrocephalus   . Diabetes mellitus   . Diverticulosis   . Hypercholesterolemia   . Hyperglycemia   . Hypertension   . Paroxysmal atrial fibrillation (HCC)   . Personal history of kidney stones     Past Surgical History:  Procedure Laterality Date  . ATRIAL FLUTTER ABLATION N/A 01/19/2015   Procedure: ATRIAL FLUTTER ABLATION;  Surgeon: [name], MD;  Location: [facility] CATH LAB;  Service: Cardiovascular;  Laterality: N/A;  . CARDIAC CATHETERIZATION N/A 05/02/2016   Procedure: Right/Left Heart Cath and Coronary Angiography;  Surgeon: Larey Dresser, MD;  Location: [facility] CV LAB;  Service: Cardiovascular;  Laterality: N/A;  . COLONOSCOPY N/A 12/29/2013   Procedure: COLONOSCOPY;  Surgeon: Lafayette Dragon, MD;  Location: [facility] ENDOSCOPY;  Service: Endoscopy;  Laterality: N/A;  . EP IMPLANTABLE DEVICE N/A 06/05/2016   MDT Hillery Aldo XT CRTD implanted by Dr Rayann Heman   . [place]   left. Muscle reattachment  . TOE SURGERY Right 12/2012   2nd toe  . TONSILLECTOMY      Social History   Socioeconomic History  . Marital status: Married    Spouse name: Rosemarie Beath  . Number of children: 1  . Years of education: 25  . Highest education level: Not on file  Social Needs  . Financial resource strain: Not on file  . Food insecurity - worry: Not on file  . Food insecurity - inability: Not on file  . Transportation needs - medical: Not on file  . Transportation needs - non-medical: Not on file  Occupational History  . Occupation: retired  Tobacco Use  . Smoking status: Former Smoker    Types: Cigarettes  . Smokeless tobacco: Never Used  Substance and Sexual Activity  . Alcohol use: No  . Drug use: No  . Sexual activity: Not on file  Other Topics Concern  . Not on file  Social History Narrative   Regular exercise-no   Rare caffeine use     Current Outpatient Medications on File Prior to Visit  Medication Sig Dispense Refill  . Ascorbic Acid (VITAMIN C) 1000 MG tablet Take 1 tablet by mouth 2 (two) times daily.     . carvedilol (COREG) 25 MG tablet Take 25 mg by mouth 2 (two) times daily with a meal.    . digoxin (LANOXIN) 0.125 MG tablet TAKE ONE TABLET BY MOUTH  ONCE DAILY 90 tablet 3  . ENTRESTO 97-103 MG TAKE ONE TABLET BY MOUTH TWICE DAILY 180 tablet 3  . fenofibrate 54 MG tablet TAKE 1 TABLET BY MOUTH ONCE DAILY 30 tablet 3  . loratadine (CLARITIN) 10 MG tablet Take 1 tablet by mouth 2 (two) times daily.     . Multiple Vitamin (MULTIVITAMIN) tablet Take 1 tablet by mouth daily.      . simvastatin (ZOCOR) 40 MG tablet Take 40 mg by mouth at bedtime.      . tamsulosin (FLOMAX) 0.4 MG CAPS capsule Take 0.4 mg by mouth daily.     Marland Kitchen torsemide (DEMADEX) 10 MG tablet Take 1 tablet (10 mg total) by mouth every other day. 30 tablet 3  . XARELTO 20 MG TABS tablet TAKE ONE TABLET BY MOUTH ONCE DAILY WITH  SUPPER 90 tablet 3   No current  facility-administered medications on file prior to visit.     Allergies  Allergen Reactions  . Aspirin Other (See Comments)    Burps, burning, stomach pains, etc  . Lisinopril Cough    Severe coughing  . Penicillins Other (See Comments)    Syncope Has patient had a PCN reaction causing immediate rash, facial/tongue/throat swelling, SOB or lightheadedness with hypotension: yes Has patient had a PCN reaction causing severe rash involving mucus membranes or skin necrosis: no Has patient had a PCN reaction that required hospitalization: yes Has patient had a PCN reaction occurring within the last 10 years: no If all of the above answers are "NO", then may proceed with Cephalosporin use.     Family History  Problem Relation Age of Onset  . Heart failure Maternal Grandmother   . Diabetes type II Sister 92  . Cancer Sister   . Hyperlipidemia Other        Parent  . Stroke Other        Parent  . Hypertension Other        Parent  . Diabetes Other        Parent  . Colon cancer Neg Hx   . Heart attack Neg Hx     BP (!) 142/60 (BP Location: Left Arm, Patient Position: Sitting, Cuff Size: Normal)   Pulse 84   Wt 262 lb 3.2 oz (118.9 kg)   SpO2 96%   BMI 36.57 kg/m   Review of Systems He denies hypoglycemia.      Objective:   Physical Exam VITAL SIGNS:  See vs page. GENERAL: no distress.  Pulses: foot pulses are intact bilaterally.   MSK: no deformity, except for hammer toes on the left foot. right 2nd toe is surgically absent.  CV: 1+ bilat edema of the legs.  Skin:  no ulcer on the feet or ankles, but the skin is very dry.  normal temp on the feet and ankles. There is bilateral onychomycosis, rust-colored discoloration, and varicosities.  Neuro: sensation is intact to touch on the feet and ankles, but decreased from normal.    Lab Results  Component Value Date   HGBA1C 9.3 10/28/2017       Assessment & Plan:  Insulin-requiring type 2 DM: The pattern of his cbg's  indicates he needs some adjustment in his therapy   Patient Instructions  Please change the insulin to 70/30 only.  Take 90 units with breakfast, and 60 units with supper.  This is probably not enough, so Please call or message [place] next week, to tell us how the blood sugar is doing.  On this type of insulin schedule, you should eat meals on a regular schedule.  If a meal is missed or significantly delayed, your blood sugar could go low.    check your blood sugar twice a day.  vary the time of day when you check, between before the 3 meals, and at bedtime.  also check if you have symptoms of your blood sugar being too high or too low.  please keep a record of the readings and bring it to your next appointment here.  please call us sooner if your blood sugar goes below 70, or if you have a lot of readings over 200.   If you have any blood sugar under 80, please write on the paper why you think that was.   Please come back for a follow-up appointment in 2 months.

## 2017-11-03 ENCOUNTER — Ambulatory Visit (INDEPENDENT_AMBULATORY_CARE_PROVIDER_SITE_OTHER): Payer: Medicare Other | Admitting: *Deleted

## 2017-11-03 DIAGNOSIS — I428 Other cardiomyopathies: Secondary | ICD-10-CM

## 2017-11-03 DIAGNOSIS — Z95810 Presence of automatic (implantable) cardiac defibrillator: Secondary | ICD-10-CM | POA: Diagnosis not present

## 2017-11-03 DIAGNOSIS — I5022 Chronic systolic (congestive) heart failure: Secondary | ICD-10-CM

## 2017-11-03 NOTE — Progress Notes (Signed)
Remote ICD transmission.   

## 2017-11-04 ENCOUNTER — Telehealth: Payer: Self-pay | Admitting: Endocrinology

## 2017-11-04 NOTE — Telephone Encounter (Signed)
Patient wife is calling to give you patient b/s reading

## 2017-11-05 ENCOUNTER — Encounter (HOSPITAL_COMMUNITY): Admission: RE | Payer: Self-pay | Source: Ambulatory Visit

## 2017-11-05 ENCOUNTER — Inpatient Hospital Stay (HOSPITAL_COMMUNITY): Admission: RE | Admit: 2017-11-05 | Payer: Medicare Other | Source: Ambulatory Visit | Admitting: Neurosurgery

## 2017-11-05 LAB — CUP PACEART REMOTE DEVICE CHECK
Brady Statistic AP VS Percent: 0.02 %
Brady Statistic AS VP Percent: 96.84 %
Brady Statistic RA Percent Paced: 1.92 %
Brady Statistic RV Percent Paced: 3.46 %
HighPow Impedance: 89 Ohm
Implantable Lead Implant Date: 20170713
Implantable Lead Implant Date: 20170713
Implantable Lead Model: 5076
Implantable Pulse Generator Implant Date: 20170713
Lead Channel Impedance Value: 399 Ohm
Lead Channel Impedance Value: 475 Ohm
Lead Channel Impedance Value: 475 Ohm
Lead Channel Impedance Value: 646 Ohm
Lead Channel Impedance Value: 722 Ohm
Lead Channel Pacing Threshold Amplitude: 0.5 V
Lead Channel Pacing Threshold Pulse Width: 0.4 ms
Lead Channel Sensing Intrinsic Amplitude: 0.375 mV
Lead Channel Sensing Intrinsic Amplitude: 11.875 mV
Lead Channel Sensing Intrinsic Amplitude: 11.875 mV
Lead Channel Setting Pacing Amplitude: 2 V
Lead Channel Setting Pacing Amplitude: 4 V
Lead Channel Setting Pacing Pulse Width: 0.8 ms
MDC IDC LEAD IMPLANT DT: 20170713
MDC IDC LEAD LOCATION: 753858
MDC IDC LEAD LOCATION: 753859
MDC IDC LEAD LOCATION: 753860
MDC IDC MSMT BATTERY REMAINING LONGEVITY: 59 mo
MDC IDC MSMT BATTERY VOLTAGE: 2.98 V
MDC IDC MSMT LEADCHNL LV IMPEDANCE VALUE: 1007 Ohm
MDC IDC MSMT LEADCHNL LV IMPEDANCE VALUE: 456 Ohm
MDC IDC MSMT LEADCHNL LV IMPEDANCE VALUE: 551 Ohm
MDC IDC MSMT LEADCHNL LV IMPEDANCE VALUE: 722 Ohm
MDC IDC MSMT LEADCHNL LV IMPEDANCE VALUE: 893 Ohm
MDC IDC MSMT LEADCHNL LV IMPEDANCE VALUE: 893 Ohm
MDC IDC MSMT LEADCHNL LV PACING THRESHOLD AMPLITUDE: 3 V
MDC IDC MSMT LEADCHNL LV PACING THRESHOLD PULSEWIDTH: 0.8 ms
MDC IDC MSMT LEADCHNL RA PACING THRESHOLD AMPLITUDE: 0.5 V
MDC IDC MSMT LEADCHNL RA PACING THRESHOLD PULSEWIDTH: 0.4 ms
MDC IDC MSMT LEADCHNL RA SENSING INTR AMPL: 0.375 mV
MDC IDC MSMT LEADCHNL RV IMPEDANCE VALUE: 418 Ohm
MDC IDC MSMT LEADCHNL RV IMPEDANCE VALUE: 532 Ohm
MDC IDC SESS DTM: 20181211062205
MDC IDC SET LEADCHNL RV PACING AMPLITUDE: 2.5 V
MDC IDC SET LEADCHNL RV PACING PULSEWIDTH: 0.4 ms
MDC IDC SET LEADCHNL RV SENSING SENSITIVITY: 0.3 mV
MDC IDC STAT BRADY AP VP PERCENT: 1.91 %
MDC IDC STAT BRADY AS VS PERCENT: 1.22 %

## 2017-11-05 SURGERY — SHUNT INSERTION VENTRICULAR-PERITONEAL
Anesthesia: General | Laterality: Right

## 2017-11-06 ENCOUNTER — Other Ambulatory Visit: Payer: Self-pay | Admitting: Cardiology

## 2017-11-06 ENCOUNTER — Encounter: Payer: Self-pay | Admitting: Cardiology

## 2017-11-06 NOTE — Telephone Encounter (Signed)
This is limited information, and I am doing my best.  Please increase the insulin to 100 units with breakfast, and 70 units with supper.

## 2017-11-06 NOTE — Telephone Encounter (Signed)
Patient wife need for you to call her today.

## 2017-11-06 NOTE — Telephone Encounter (Signed)
Patient's wife is concerned that his blood sugars are still anywhere from the 170's to 200's before & after breakfast. Patient's surgery however has been postponed until the 28th. Patient's wife is worried his numbers are to high & said that you talked about adjusting his 70/30. Please advise?

## 2017-11-09 NOTE — Telephone Encounter (Signed)
Patient's wife notified of instructions and voiced understanding. She had no further questions at this time.

## 2017-11-09 NOTE — Progress Notes (Addendum)
EPIC Encounter for ICM Monitoring  Patient Name: GENEROSO CROPPER is a 76 y.o. male Date: 11/09/2017 Primary Care Physican: Cyndy Freeze, MD Primary Cardiologist:[place] Electrophysiologist: Allred Dry Weight:Previous weight 255 lbs  Bi-V Pacing: 96.8%   Transmission reviewed   Thoracic impedance normal.  Prescribed dosage: Torsemide 10 mg 1 tablet every other day.   Labs: 05/29/2017 Creatinine 1.32, BUN 13, Potassium 4.7, Sodium 134, EGFR 51-60 03/24/2017 Creatinine 1.47, BUN 25, Potassium 5.2, Sodium 133, EGFR 46-54 01/09/2017 Creatinine 1.42, BUN 15, Potassium 4.6, Sodium 136, EGFR 47-55 12/22/2016 Creatinine 1.56, BUN 22, Potassium 5.0, Sodium 138, EGFR 43-50 12/02/2016 Creatinine 1.20, BUN 14, Potassium 4.9, Sodium 138, EGFR >60 10/03/2016 Creatinine 1.27, BUN 16, Potassium 4.9, Sodium 135, EGFR 54->60  08/01/2016 Creatinine 1.21, BUN 16, Potassium 5.0, Sodium 138, EGFR 59-68  07/23/2016 Creatinine 1.52, BUN 19, Potassium 4.5, Sodium 136  05/29/2016 Creatinine 1.26, BUN 15, Potassium 4.6, Sodium 141  04/30/2016 Creatinine 1.17, BUN 9, Potassium 4.9, Sodium 138  12/28/2015 Creatinine 1.35, BUN 13, Potassium 4.9, Sodium 142  11/30/2016Creatinine 1.37, BUN 20, Potassium 4.3, Sodium 139   Recommendations: No changes.     Follow-up plan: ICM clinic phone appointment on 12/07/2017.    Copy of ICM check sent to Dr. Rayann Heman.   3 month ICM trend: 11/03/2017   1 Year ICM trend:      Rosalene Billings, RN 11/09/2017 2:23 PM

## 2017-11-12 ENCOUNTER — Telehealth: Payer: Self-pay

## 2017-11-12 ENCOUNTER — Other Ambulatory Visit: Payer: Self-pay | Admitting: Neurosurgery

## 2017-11-12 NOTE — Telephone Encounter (Signed)
Please increase the insulin to 110 units with breakfast, and 80 units with supper Please call or message [place] next week, to tell us how the blood sugar is doing

## 2017-11-12 NOTE — Telephone Encounter (Signed)
I spoke with patient's wife & she is still very concerned about blood sugar readings. They are still all over 200. She read me readings from morning & evening the lowest being right at 200 regardless of if eh has or has not eaten. Please advise?

## 2017-11-12 NOTE — Telephone Encounter (Signed)
Patient's wife called back & I gave her new insulin dosages. She stated that she would call back on Monday to let us know how blood sugars were running since patient is scheduled for surgery the 28th.

## 2017-11-12 NOTE — Telephone Encounter (Signed)
I called and left patient's wife a VM to call back so I could give her insulin dosage increase.

## 2017-11-16 ENCOUNTER — Other Ambulatory Visit (HOSPITAL_COMMUNITY): Payer: Self-pay | Admitting: *Deleted

## 2017-11-16 NOTE — Pre-Procedure Instructions (Addendum)
Justin Hill  11/16/2017    Your procedure is scheduled on Friday, November 20, 2017 at 2:25 PM.   Report to [facility] Entrance "A" Admitting Office at 12:30 PM.   Call this number if you have problems the morning of surgery: [phone]   Questions prior to day of surgery, please call [phone] between 8 & 4 PM.   Remember:  Do not eat food or drink liquids after midnight Thursday, 11/19/17.  Take these medicines the morning of surgery with A SIP OF WATER: Carvedilol (Coreg), Digoxin (Lanoxin), Tamsulosin (Flomax)  Stop Xarelto as instructed by surgeon. Stop Multivitamins as of today.   How to Manage Your Diabetes Before Surgery   Why is it important to control my blood sugar before and after surgery?   Improving blood sugar levels before and after surgery helps healing and can limit problems.  A way of improving blood sugar control is eating a healthy diet by:  - Eating less sugar and carbohydrates  - Increasing activity/exercise  - Talk with your doctor about reaching your blood sugar goals  High blood sugars (greater than 180 mg/dL) can raise your risk of infections and slow down your recovery so you will need to focus on controlling your diabetes during the weeks before surgery.  Make sure that the doctor who takes care of your diabetes knows about your planned surgery including the date and location.  How do I manage my blood sugars before surgery?   Check your blood sugar at least 4 times a day, 2 days before surgery to make sure that they are not too high or low.  Check your blood sugar the morning of your surgery when you wake up and every 2 hours until you get to the Short-Stay unit.  Treat a low blood sugar (less than 70 mg/dL) with 1/2 cup of clear juice (cranberry or apple), 4 glucose tablets, OR glucose gel.  Recheck blood sugar in 15 minutes after treatment (to make sure it is greater than 70 mg/dL).  If blood sugar is not greater than  70 mg/dL on re-check, call [phone] for further instructions.   Report your blood sugar to the Short-Stay nurse when you get to Short-Stay.  References:  [facility], 2007 "How to Manage your Diabetes Before and After Surgery".  What do I do about my diabetes medications?   THE NIGHT BEFORE SURGERY, take 56 units of Novolin 70/30 Insulin. The day of surgery do not take your Novolin 70/30 Insulin   Do not wear jewelry.  Do not wear lotions, powders, cologne or deodorant.  Men may shave face and neck.  Do not bring valuables to the hospital.  [facility] is not responsible for any belongings or valuables.  Contacts, dentures or bridgework may not be worn into surgery.  Leave your suitcase in the car.  After surgery it may be brought to your room.  For patients admitted to the hospital, discharge time will be determined by your treatment team.  [facility] - Preparing for Surgery  Before surgery, you can play an important role.  Because skin is not sterile, your skin needs to be as free of germs as possible.  You can reduce the number of germs on you skin by washing with CHG (chlorahexidine gluconate) soap before surgery.  CHG is an antiseptic cleaner which kills germs and bonds with the skin to continue killing germs even after washing.  Please DO NOT use if you have  an allergy to CHG or antibacterial soaps.  If your skin becomes reddened/irritated stop using the CHG and inform your nurse when you arrive at Short Stay.  Do not shave (including legs and underarms) for at least 48 hours prior to the first CHG shower.  You may shave your face.  Please follow these instructions carefully:   1.  Shower with CHG Soap the night before surgery and the                    morning of Surgery.  2.  If you choose to wash your hair, wash your hair first as usual with your       normal shampoo.  3.  After you shampoo, rinse your hair and body thoroughly to remove the  shampoo.  4.  Use CHG as you would any other liquid soap.  You can apply chg directly       to the skin and wash gently with scrungie or a clean washcloth.  5.  Apply the CHG Soap to your body ONLY FROM THE NECK DOWN.        Do not use on open wounds or open sores.  Avoid contact with your eyes, ears, mouth and genitals (private parts).  Wash genitals (private parts) with your normal soap.  6.  Wash thoroughly, paying special attention to the area where your surgery        will be performed.  7.  Thoroughly rinse your body with warm water from the neck down.  8.  DO NOT shower/wash with your normal soap after using and rinsing off       the CHG Soap.  9.  Pat yourself dry with a clean towel.            10.  Wear clean pajamas.            11.  Place clean sheets on your bed the night of your first shower and do not        sleep with pets.  Day of Surgery  Do not apply any lotions/deodorants the morning of surgery.  Please wear clean clothes to the hospital.   Please read over the fact sheets that you were given.

## 2017-11-18 ENCOUNTER — Encounter (HOSPITAL_COMMUNITY): Payer: Self-pay

## 2017-11-18 ENCOUNTER — Other Ambulatory Visit: Payer: Self-pay

## 2017-11-18 ENCOUNTER — Encounter (HOSPITAL_COMMUNITY)
Admission: RE | Admit: 2017-11-18 | Discharge: 2017-11-18 | Disposition: A | Discharge status: Home / Self Care | Payer: Medicare Other | Source: Ambulatory Visit | Attending: Neurosurgery | Admitting: Neurosurgery

## 2017-11-18 ENCOUNTER — Encounter: Payer: Self-pay | Admitting: Endocrinology

## 2017-11-18 ENCOUNTER — Telehealth: Payer: Self-pay

## 2017-11-18 HISTORY — DX: Pneumonia, unspecified organism: J18.9

## 2017-11-18 HISTORY — DX: Personal history of urinary calculi: Z87.442

## 2017-11-18 HISTORY — DX: Sleep apnea, unspecified: G47.30

## 2017-11-18 LAB — CBC
HEMATOCRIT: 42.8 % (ref 39.0–52.0)
HEMOGLOBIN: 14.3 g/dL (ref 13.0–17.0)
MCH: 30.9 pg (ref 26.0–34.0)
MCHC: 33.4 g/dL (ref 30.0–36.0)
MCV: 92.4 fL (ref 78.0–100.0)
Platelets: 142 10*3/uL — ABNORMAL LOW (ref 150–400)
RBC: 4.63 MIL/uL (ref 4.22–5.81)
RDW: 14.4 % (ref 11.5–15.5)
WBC: 6.4 10*3/uL (ref 4.0–10.5)

## 2017-11-18 LAB — BASIC METABOLIC PANEL
ANION GAP: 10 (ref 5–15)
BUN: 12 mg/dL (ref 6–20)
CALCIUM: 9.2 mg/dL (ref 8.9–10.3)
CHLORIDE: 107 mmol/L (ref 101–111)
CO2: 22 mmol/L (ref 22–32)
Creatinine, Ser: 1.45 mg/dL — ABNORMAL HIGH (ref 0.61–1.24)
GFR calc non Af Amer: 46 mL/min — ABNORMAL LOW (ref >60)
GFR, EST AFRICAN AMERICAN: 53 mL/min — AB (ref >60)
GLUCOSE: 118 mg/dL — AB (ref 65–99)
POTASSIUM: 4.2 mmol/L (ref 3.5–5.1)
Sodium: 139 mmol/L (ref 135–145)

## 2017-11-18 LAB — GLUCOSE, CAPILLARY: Glucose-Capillary: 154 mg/dL — ABNORMAL HIGH (ref 65–99)

## 2017-11-18 NOTE — Telephone Encounter (Signed)
Ok, Please increase the insulin to 110 units with breakfast, and 70 units with supper. Please continue the same advice for surgery day.

## 2017-11-18 NOTE — Progress Notes (Signed)
Pt has hx of A-fib and has an ICD/Pacer. He sees Dr. Aundra Dubin and Dr. Rayann Heman. Pt has cardiac clearance noted 10/05/17 in Epic. Pt denies any recent chest pain or sob. Pt has some altered mental status, pt's wife answered most of the questions. Pt is a type 2 Diabetic. Last A1C was 9.3 on 10/28/17. Mrs. Jaskowiak states Dr. Loanne Drilling adjusted pt's Novolin 70/30 insulin and his fasting blood sugar is now running in the 170's. Today it was 150. She is to call Dr. Loanne Drilling today to let him know how his blood sugar is doing and she thinks that he may adjust the dosage again. I instructed her if he does change the dose, the night before surgery pt is to take 70% of his regular dose of Novolin 70/30 and none the day of surgery. She voiced understanding.

## 2017-11-18 NOTE — Telephone Encounter (Signed)
Patient's wife called & stated that at pre-op blood work patient's blood sugar was 154. That's with out breakfast & the lowest number he has been. All other numbers have been between 170-200. She is supposed to give patient 70% of insulin dosage on Friday morning before surgery. She has those dosage calculations unless they need to be changed. Please advise if you want to keep dosage the same based on current cbg's.

## 2017-11-18 NOTE — Progress Notes (Signed)
Anesthesia Chart Review:  Pt is a 76 year old male scheduled for R shunt placement for normal pressure hydrocephalus on 11/20/2017 with Newman Pies, MD  Care providers:  - PCP is Cyndy Freeze, MD  - HF cardiologist is Loralie Champagne, MD who is aware of upcoming surgery and gave ok to hold xarelto before surgery. Last office visit 09/07/17  - EP cardiologist is [name], MD. Last office visit 09/21/17  - Endocrinologist is Renato Shin, MD.  Pt's blood glucose has been too high lately, pt's wife called Dr. Loanne Drilling 11/12/17, and insulin dose increased to treat this. At pre-admission testing, wife reports pt's fasting glucose now in 170's.   PMH includes:  Nonischemic cardiomyopathy, AICD (CRT-D), CHF, PAF, HTN, DM, hyperlipidemia, CKD (stage 3), communicating hydrocephalus. Former smoker. BMI 36.5  Medications include: carvedilol, digoxin, entresto, fenofibrate, novolin 70/30, simvastatin, torsemide, xarelto. Last dose xarelto 11/14/17  BP (!) 105/54   Pulse 80   Temp 36.6 C (Oral)   Resp 18   Ht 5\' 11"  (1.803 m)   Wt 262 lb 5 oz (119 kg)   SpO2 96%   BMI 36.59 kg/m   Preoperative labs reviewed.   - Glucose 118. HbA1c was 9.3 on 10/28/17 - PT will be obtained day of surgery  EKG 09/07/17: Atrial-sensed ventricular-paced rhythm  Echo 11/25/16:  - Left ventricle: The cavity size was normal. There was mild concentric hypertrophy. Systolic function was mildly to moderately reduced. The estimated ejection fraction was in the range of 40% to 45%. Wall motion was normal; there were no regional wall motion abnormalities. Doppler parameters are consistent with abnormal left ventricular relaxation (grade 1 diastolic dysfunction). - Ventricular septum: Septal motion showed paradox.  Cardiopulmonary exercise test 10/26/15:  - Exercise testing with gas exchange demonstrates a mild functional impairment when compared to matched sedentary norms. Pre-exercise spirometry suggests restrictive  lung physiology related to his body habitus. Patient appears to have mild to moderate HF limitation, however primarily limited due to his body - Habitus and restrictive lung physiology. At peak exercise patient primarily limited by his ventilation. Recommend aggressive weight loss efforts.  Cardiac cath 07/06/07:  1. Nonischemic cardiomyopathy with improved hemodynamics from the original presentation at [facility].  2. No significant coronary obstruction.  3. No step-up between the superior vena cava and pulmonary artery.  4. Borderline cardiac output.  Perioperative prescription for AICD indicates procedure should not interfere with device function.   If no changes, I anticipate pt can proceed with surgery as scheduled.   Willeen Cass, FNP-BC [facility]/Anesthesiology Phone: [phone] 11/18/2017 1:21 PM

## 2017-11-19 LAB — HM DIABETES EYE EXAM

## 2017-11-19 MED ORDER — VANCOMYCIN HCL 10 G IV SOLR
1500.0000 mg | INTRAVENOUS | Status: AC
  Administered 2017-11-20: 1500 mg via INTRAVENOUS
  Filled 2017-11-19: qty 1500

## 2017-11-19 NOTE — Telephone Encounter (Signed)
I called & patient's wife stated that was the dose he was already taking in the morning. He has been doing the 110 in the morning & 80 at night. His blood sugar last night was 264 & this morning before breakfast 194. Please advise? Patient surgery is scheduled for tomorrow.

## 2017-11-19 NOTE — Telephone Encounter (Signed)
I called an spoke with patient's wife & she stated that after surgery she would let us know how blood sugars were doing so possible adjustments could be made then.

## 2017-11-19 NOTE — Telephone Encounter (Signed)
As surgery is tomorrow, Please continue the same for now.  Please call or message [place] next week, to tell us how the blood sugar is doing

## 2017-11-20 ENCOUNTER — Other Ambulatory Visit: Payer: Self-pay

## 2017-11-20 ENCOUNTER — Inpatient Hospital Stay (HOSPITAL_COMMUNITY): Payer: Medicare Other | Admitting: Vascular Surgery

## 2017-11-20 ENCOUNTER — Encounter (HOSPITAL_COMMUNITY)
Admission: RE | Disposition: A | Discharge status: Home / Self Care | Payer: Self-pay | Source: Ambulatory Visit | Attending: Neurosurgery

## 2017-11-20 ENCOUNTER — Encounter (HOSPITAL_COMMUNITY): Payer: Self-pay | Admitting: Surgery

## 2017-11-20 ENCOUNTER — Inpatient Hospital Stay (HOSPITAL_COMMUNITY)
Admission: RE | Admit: 2017-11-20 | Discharge: 2017-11-21 | DRG: 032 | Disposition: A | Discharge status: Home / Self Care | Payer: Medicare Other | Source: Ambulatory Visit | Attending: Neurosurgery | Admitting: Neurosurgery

## 2017-11-20 DIAGNOSIS — G919 Hydrocephalus, unspecified: Secondary | ICD-10-CM | POA: Diagnosis not present

## 2017-11-20 DIAGNOSIS — Z794 Long term (current) use of insulin: Secondary | ICD-10-CM

## 2017-11-20 DIAGNOSIS — Z87891 Personal history of nicotine dependence: Secondary | ICD-10-CM | POA: Diagnosis not present

## 2017-11-20 DIAGNOSIS — Z79899 Other long term (current) drug therapy: Secondary | ICD-10-CM

## 2017-11-20 DIAGNOSIS — G4730 Sleep apnea, unspecified: Secondary | ICD-10-CM | POA: Diagnosis present

## 2017-11-20 DIAGNOSIS — I480 Paroxysmal atrial fibrillation: Secondary | ICD-10-CM | POA: Diagnosis present

## 2017-11-20 DIAGNOSIS — E1122 Type 2 diabetes mellitus with diabetic chronic kidney disease: Secondary | ICD-10-CM | POA: Diagnosis present

## 2017-11-20 DIAGNOSIS — I5022 Chronic systolic (congestive) heart failure: Secondary | ICD-10-CM | POA: Diagnosis present

## 2017-11-20 DIAGNOSIS — N183 Chronic kidney disease, stage 3 (moderate): Secondary | ICD-10-CM | POA: Diagnosis present

## 2017-11-20 DIAGNOSIS — I429 Cardiomyopathy, unspecified: Secondary | ICD-10-CM | POA: Diagnosis present

## 2017-11-20 DIAGNOSIS — Z7901 Long term (current) use of anticoagulants: Secondary | ICD-10-CM | POA: Diagnosis not present

## 2017-11-20 DIAGNOSIS — E7800 Pure hypercholesterolemia, unspecified: Secondary | ICD-10-CM | POA: Diagnosis present

## 2017-11-20 DIAGNOSIS — G912 (Idiopathic) normal pressure hydrocephalus: Secondary | ICD-10-CM | POA: Diagnosis present

## 2017-11-20 DIAGNOSIS — I130 Hypertensive heart and chronic kidney disease with heart failure and stage 1 through stage 4 chronic kidney disease, or unspecified chronic kidney disease: Secondary | ICD-10-CM | POA: Diagnosis present

## 2017-11-20 HISTORY — PX: VENTRICULOPERITONEAL SHUNT: SHX204

## 2017-11-20 LAB — GLUCOSE, CAPILLARY
GLUCOSE-CAPILLARY: 231 mg/dL — AB (ref 65–99)
GLUCOSE-CAPILLARY: 276 mg/dL — AB (ref 65–99)
Glucose-Capillary: 197 mg/dL — ABNORMAL HIGH (ref 65–99)
Glucose-Capillary: 189 mg/dL — ABNORMAL HIGH (ref 65–99)

## 2017-11-20 LAB — HEMOGLOBIN A1C
Hgb A1c MFr Bld: 9.2 % — ABNORMAL HIGH (ref 4.8–5.6)
Mean Plasma Glucose: 217.34 mg/dL

## 2017-11-20 LAB — MRSA PCR SCREENING: MRSA by PCR: NEGATIVE

## 2017-11-20 LAB — PROTIME-INR
INR: 0.98
PROTHROMBIN TIME: 12.9 s (ref 11.4–15.2)

## 2017-11-20 SURGERY — SHUNT INSERTION VENTRICULAR-PERITONEAL
Anesthesia: General | Laterality: Right

## 2017-11-20 MED ORDER — EPHEDRINE SULFATE 50 MG/ML IJ SOLN
INTRAMUSCULAR | Status: DC | PRN
  Administered 2017-11-20 (×3): 10 mg via INTRAVENOUS
  Administered 2017-11-20: 5 mg via INTRAVENOUS

## 2017-11-20 MED ORDER — LABETALOL HCL 5 MG/ML IV SOLN: 10.0000 mg | INTRAVENOUS | Status: DC | PRN

## 2017-11-20 MED ORDER — HYDROCODONE-ACETAMINOPHEN 5-325 MG PO TABS: 1.0000 | ORAL_TABLET | ORAL | Status: DC | PRN

## 2017-11-20 MED ORDER — MORPHINE SULFATE (PF) 4 MG/ML IV SOLN: 4.0000 mg | INTRAVENOUS | Status: DC | PRN

## 2017-11-20 MED ORDER — SODIUM CHLORIDE 0.9 % IR SOLN
Status: DC | PRN
  Administered 2017-11-20: 17:00:00

## 2017-11-20 MED ORDER — CARVEDILOL 25 MG PO TABS
25.0000 mg | ORAL_TABLET | Freq: Two times a day (BID) | ORAL | Status: DC
  Administered 2017-11-21: 25 mg via ORAL
  Filled 2017-11-20: qty 1

## 2017-11-20 MED ORDER — CHLORHEXIDINE GLUCONATE CLOTH 2 % EX PADS: 6.0000 | MEDICATED_PAD | Freq: Once | CUTANEOUS | Status: DC

## 2017-11-20 MED ORDER — PHENYLEPHRINE 40 MCG/ML (10ML) SYRINGE FOR IV PUSH (FOR BLOOD PRESSURE SUPPORT)
PREFILLED_SYRINGE | INTRAVENOUS | Status: DC | PRN
  Administered 2017-11-20 (×3): 80 ug via INTRAVENOUS

## 2017-11-20 MED ORDER — SUGAMMADEX SODIUM 200 MG/2ML IV SOLN
INTRAVENOUS | Status: DC | PRN
  Administered 2017-11-20: 500 mg via INTRAVENOUS

## 2017-11-20 MED ORDER — ONDANSETRON HCL 4 MG PO TABS: 4.0000 mg | ORAL_TABLET | ORAL | Status: DC | PRN

## 2017-11-20 MED ORDER — ESMOLOL HCL 100 MG/10ML IV SOLN
INTRAVENOUS | Status: DC | PRN
  Administered 2017-11-20: 30 mg via INTRAVENOUS

## 2017-11-20 MED ORDER — DEXAMETHASONE SODIUM PHOSPHATE 10 MG/ML IJ SOLN
INTRAMUSCULAR | Status: AC
  Filled 2017-11-20: qty 1

## 2017-11-20 MED ORDER — THROMBIN (RECOMBINANT) 5000 UNITS EX SOLR
CUTANEOUS | Status: AC
  Filled 2017-11-20: qty 5000

## 2017-11-20 MED ORDER — ONDANSETRON HCL 4 MG/2ML IJ SOLN: 4.0000 mg | INTRAMUSCULAR | Status: DC | PRN

## 2017-11-20 MED ORDER — PROPOFOL 10 MG/ML IV BOLUS
INTRAVENOUS | Status: DC | PRN
  Administered 2017-11-20: 30 mg via INTRAVENOUS
  Administered 2017-11-20: 150 mg via INTRAVENOUS

## 2017-11-20 MED ORDER — TAMSULOSIN HCL 0.4 MG PO CAPS
0.4000 mg | ORAL_CAPSULE | Freq: Every day | ORAL | Status: DC
  Administered 2017-11-21: 0.4 mg via ORAL
  Filled 2017-11-20: qty 1

## 2017-11-20 MED ORDER — LACTATED RINGERS IV SOLN
INTRAVENOUS | Status: DC
  Administered 2017-11-20: 23:00:00 via INTRAVENOUS

## 2017-11-20 MED ORDER — LIDOCAINE 2% (20 MG/ML) 5 ML SYRINGE
INTRAMUSCULAR | Status: DC | PRN
  Administered 2017-11-20: 100 mg via INTRAVENOUS

## 2017-11-20 MED ORDER — BUPIVACAINE HCL (PF) 0.5 % IJ SOLN
INTRAMUSCULAR | Status: AC
  Filled 2017-11-20: qty 30

## 2017-11-20 MED ORDER — DOCUSATE SODIUM 100 MG PO CAPS
100.0000 mg | ORAL_CAPSULE | Freq: Two times a day (BID) | ORAL | Status: DC
  Administered 2017-11-20 – 2017-11-21 (×2): 100 mg via ORAL
  Filled 2017-11-20 (×2): qty 1

## 2017-11-20 MED ORDER — TORSEMIDE 20 MG PO TABS: 10.0000 mg | ORAL_TABLET | ORAL | Status: DC

## 2017-11-20 MED ORDER — FENOFIBRATE 54 MG PO TABS
54.0000 mg | ORAL_TABLET | Freq: Every day | ORAL | Status: DC
  Administered 2017-11-21: 54 mg via ORAL
  Filled 2017-11-20: qty 1

## 2017-11-20 MED ORDER — ONDANSETRON HCL 4 MG/2ML IJ SOLN
INTRAMUSCULAR | Status: DC | PRN
  Administered 2017-11-20: 4 mg via INTRAVENOUS

## 2017-11-20 MED ORDER — DIGOXIN 125 MCG PO TABS
125.0000 ug | ORAL_TABLET | Freq: Every day | ORAL | Status: DC
  Administered 2017-11-21: 125 ug via ORAL
  Filled 2017-11-20: qty 1

## 2017-11-20 MED ORDER — DEXMEDETOMIDINE HCL IN NACL 200 MCG/50ML IV SOLN
INTRAVENOUS | Status: DC | PRN
  Administered 2017-11-20 (×2): 12 ug via INTRAVENOUS
  Administered 2017-11-20: 16 ug via INTRAVENOUS

## 2017-11-20 MED ORDER — ONDANSETRON HCL 4 MG/2ML IJ SOLN
INTRAMUSCULAR | Status: AC
  Filled 2017-11-20: qty 4

## 2017-11-20 MED ORDER — LORATADINE 10 MG PO TABS
10.0000 mg | ORAL_TABLET | Freq: Two times a day (BID) | ORAL | Status: DC
  Administered 2017-11-20 – 2017-11-21 (×2): 10 mg via ORAL
  Filled 2017-11-20 (×2): qty 1

## 2017-11-20 MED ORDER — THROMBIN (RECOMBINANT) 5000 UNITS EX SOLR
CUTANEOUS | Status: DC | PRN
  Administered 2017-11-20: 5000 [IU] via TOPICAL

## 2017-11-20 MED ORDER — PROPOFOL 10 MG/ML IV BOLUS
INTRAVENOUS | Status: AC
  Filled 2017-11-20: qty 20

## 2017-11-20 MED ORDER — PANTOPRAZOLE SODIUM 40 MG IV SOLR
40.0000 mg | Freq: Every day | INTRAVENOUS | Status: DC
  Administered 2017-11-20: 40 mg via INTRAVENOUS
  Filled 2017-11-20: qty 40

## 2017-11-20 MED ORDER — INSULIN ASPART 100 UNIT/ML ~~LOC~~ SOLN
0.0000 [IU] | SUBCUTANEOUS | Status: DC
  Administered 2017-11-20: 11 [IU] via SUBCUTANEOUS
  Administered 2017-11-21 (×2): 7 [IU] via SUBCUTANEOUS

## 2017-11-20 MED ORDER — HEMOSTATIC AGENTS (NO CHARGE) OPTIME
TOPICAL | Status: DC | PRN
  Administered 2017-11-20: 1 via TOPICAL

## 2017-11-20 MED ORDER — BACITRACIN ZINC 500 UNIT/GM EX OINT
TOPICAL_OINTMENT | CUTANEOUS | Status: AC
  Filled 2017-11-20: qty 28.35

## 2017-11-20 MED ORDER — SUCCINYLCHOLINE CHLORIDE 200 MG/10ML IV SOSY
PREFILLED_SYRINGE | INTRAVENOUS | Status: DC | PRN
  Administered 2017-11-20: 100 mg via INTRAVENOUS

## 2017-11-20 MED ORDER — SACUBITRIL-VALSARTAN 97-103 MG PO TABS
1.0000 | ORAL_TABLET | Freq: Two times a day (BID) | ORAL | Status: DC
  Administered 2017-11-21: 1 via ORAL
  Filled 2017-11-20: qty 1

## 2017-11-20 MED ORDER — PHENYLEPHRINE 40 MCG/ML (10ML) SYRINGE FOR IV PUSH (FOR BLOOD PRESSURE SUPPORT)
PREFILLED_SYRINGE | INTRAVENOUS | Status: AC
  Filled 2017-11-20: qty 20

## 2017-11-20 MED ORDER — FENTANYL CITRATE (PF) 100 MCG/2ML IJ SOLN
INTRAMUSCULAR | Status: DC | PRN
  Administered 2017-11-20 (×2): 50 ug via INTRAVENOUS
  Administered 2017-11-20: 25 ug via INTRAVENOUS

## 2017-11-20 MED ORDER — 0.9 % SODIUM CHLORIDE (POUR BTL) OPTIME
TOPICAL | Status: DC | PRN
  Administered 2017-11-20: 1000 mL

## 2017-11-20 MED ORDER — SIMVASTATIN 40 MG PO TABS
40.0000 mg | ORAL_TABLET | Freq: Every day | ORAL | Status: DC
  Administered 2017-11-20: 40 mg via ORAL
  Filled 2017-11-20: qty 1

## 2017-11-20 MED ORDER — LACTATED RINGERS IV SOLN
INTRAVENOUS | Status: DC
  Administered 2017-11-20: 13:00:00 via INTRAVENOUS

## 2017-11-20 MED ORDER — ACETAMINOPHEN 650 MG RE SUPP: 650.0000 mg | RECTAL | Status: DC | PRN

## 2017-11-20 MED ORDER — TORSEMIDE 20 MG PO TABS
10.0000 mg | ORAL_TABLET | ORAL | Status: DC
  Administered 2017-11-21: 10 mg via ORAL
  Filled 2017-11-20: qty 1

## 2017-11-20 MED ORDER — BUPIVACAINE HCL 0.5 % IJ SOLN
INTRAMUSCULAR | Status: DC | PRN
  Administered 2017-11-20: 10 mL

## 2017-11-20 MED ORDER — ROCURONIUM BROMIDE 10 MG/ML (PF) SYRINGE
PREFILLED_SYRINGE | INTRAVENOUS | Status: DC | PRN
  Administered 2017-11-20: 10 mg via INTRAVENOUS
  Administered 2017-11-20: 50 mg via INTRAVENOUS

## 2017-11-20 MED ORDER — VANCOMYCIN HCL IN DEXTROSE 1-5 GM/200ML-% IV SOLN
1000.0000 mg | Freq: Two times a day (BID) | INTRAVENOUS | Status: DC
  Administered 2017-11-21: 1000 mg via INTRAVENOUS
  Filled 2017-11-20 (×2): qty 200

## 2017-11-20 MED ORDER — DEXAMETHASONE SODIUM PHOSPHATE 10 MG/ML IJ SOLN
INTRAMUSCULAR | Status: DC | PRN
  Administered 2017-11-20: 10 mg via INTRAVENOUS

## 2017-11-20 MED ORDER — LIDOCAINE 2% (20 MG/ML) 5 ML SYRINGE
INTRAMUSCULAR | Status: AC
  Filled 2017-11-20: qty 5

## 2017-11-20 MED ORDER — PROMETHAZINE HCL 25 MG PO TABS: 12.5000 mg | ORAL_TABLET | ORAL | Status: DC | PRN

## 2017-11-20 MED ORDER — ACETAMINOPHEN 325 MG PO TABS: 650.0000 mg | ORAL_TABLET | ORAL | Status: DC | PRN

## 2017-11-20 MED ORDER — FENTANYL CITRATE (PF) 250 MCG/5ML IJ SOLN
INTRAMUSCULAR | Status: AC
Start: 2017-11-20 — End: ?
  Filled 2017-11-20: qty 5

## 2017-11-20 SURGICAL SUPPLY — 67 items
BAG DECANTER FOR FLEXI CONT (MISCELLANEOUS) ×3 IMPLANT
BENZOIN TINCTURE PRP APPL 2/3 (GAUZE/BANDAGES/DRESSINGS) ×3 IMPLANT
BLADE CLIPPER SURG (BLADE) ×6 IMPLANT
BOOT SUTURE AID YELLOW STND (SUTURE) ×3 IMPLANT
BUR ACORN 6.0 PRECISION (BURR) ×2 IMPLANT
BUR ACORN 6.0MM PRECISION (BURR) ×1
BUR PRECISION FLUTE 6.0 (BURR) ×3 IMPLANT
CANISTER SUCT 3000ML PPV (MISCELLANEOUS) ×3 IMPLANT
CARTRIDGE OIL MAESTRO DRILL (MISCELLANEOUS) ×1 IMPLANT
CLIP RANEY DISP (INSTRUMENTS) IMPLANT
CLOSURE WOUND 1/2 X4 (GAUZE/BANDAGES/DRESSINGS) ×1
DIFFUSER DRILL AIR PNEUMATIC (MISCELLANEOUS) ×3 IMPLANT
DRAPE INCISE IOBAN 85X60 (DRAPES) ×3 IMPLANT
DRAPE ORTHO SPLIT 77X108 STRL (DRAPES) ×4
DRAPE POUCH INSTRU U-SHP 10X18 (DRAPES) ×3 IMPLANT
DRAPE SURG 17X23 STRL (DRAPES) ×18 IMPLANT
DRAPE SURG ORHT 6 SPLT 77X108 (DRAPES) ×2 IMPLANT
ELECT BLADE 4.0 EZ CLEAN MEGAD (MISCELLANEOUS) ×3
ELECT REM PT RETURN 9FT ADLT (ELECTROSURGICAL) ×3
ELECTRODE BLDE 4.0 EZ CLN MEGD (MISCELLANEOUS) ×1 IMPLANT
ELECTRODE REM PT RTRN 9FT ADLT (ELECTROSURGICAL) ×1 IMPLANT
GAUZE SPONGE 4X4 12PLY STRL (GAUZE/BANDAGES/DRESSINGS) ×3 IMPLANT
GAUZE SPONGE 4X4 16PLY XRAY LF (GAUZE/BANDAGES/DRESSINGS) IMPLANT
GLOVE BIO SURGEON STRL SZ8 (GLOVE) ×6 IMPLANT
GLOVE BIO SURGEON STRL SZ8.5 (GLOVE) ×3 IMPLANT
GLOVE BIOGEL PI IND STRL 7.5 (GLOVE) ×1 IMPLANT
GLOVE BIOGEL PI INDICATOR 7.5 (GLOVE) ×2
GLOVE EXAM NITRILE LRG STRL (GLOVE) IMPLANT
GLOVE EXAM NITRILE XL STR (GLOVE) IMPLANT
GLOVE EXAM NITRILE XS STR PU (GLOVE) IMPLANT
GLOVE INDICATOR 8.5 STRL (GLOVE) ×6 IMPLANT
GLOVE SS BIOGEL STRL SZ 7 (GLOVE) ×1 IMPLANT
GLOVE SUPERSENSE BIOGEL SZ 7 (GLOVE) ×2
GOWN STRL REUS W/ TWL LRG LVL3 (GOWN DISPOSABLE) ×1 IMPLANT
GOWN STRL REUS W/ TWL XL LVL3 (GOWN DISPOSABLE) ×3 IMPLANT
GOWN STRL REUS W/TWL LRG LVL3 (GOWN DISPOSABLE) ×2
GOWN STRL REUS W/TWL XL LVL3 (GOWN DISPOSABLE) ×6
HEMOSTAT POWDER SURGIFOAM 1G (HEMOSTASIS) ×3 IMPLANT
KIT BASIN OR (CUSTOM PROCEDURE TRAY) ×3 IMPLANT
KIT ROOM TURNOVER OR (KITS) ×3 IMPLANT
NEEDLE HYPO 22GX1.5 SAFETY (NEEDLE) ×3 IMPLANT
NS IRRIG 1000ML POUR BTL (IV SOLUTION) ×3 IMPLANT
OIL CARTRIDGE MAESTRO DRILL (MISCELLANEOUS) ×3
PACK LAMINECTOMY NEURO (CUSTOM PROCEDURE TRAY) ×3 IMPLANT
PAD ARMBOARD 7.5X6 YLW CONV (MISCELLANEOUS) ×9 IMPLANT
SHEATH PERITONEAL INTRO 46 (MISCELLANEOUS) IMPLANT
SHEATH PERITONEAL INTRO 61 (MISCELLANEOUS) ×3 IMPLANT
SPONGE LAP 4X18 X RAY DECT (DISPOSABLE) IMPLANT
SPONGE SURGIFOAM ABS GEL SZ50 (HEMOSTASIS) IMPLANT
STAPLER SKIN PROX WIDE 3.9 (STAPLE) ×3 IMPLANT
STRIP CLOSURE SKIN 1/2X4 (GAUZE/BANDAGES/DRESSINGS) ×2 IMPLANT
SUT ETHILON 3 0 PS 1 (SUTURE) ×3 IMPLANT
SUT NURALON 4 0 TR CR/8 (SUTURE) ×3 IMPLANT
SUT SILK 0 TIES 10X30 (SUTURE) ×3 IMPLANT
SUT SILK 3 0 SH 30 (SUTURE) ×3 IMPLANT
SUT VIC AB 1 CT1 18XBRD ANBCTR (SUTURE) ×1 IMPLANT
SUT VIC AB 1 CT1 8-18 (SUTURE) ×2
SUT VIC AB 2-0 CP2 18 (SUTURE) ×3 IMPLANT
SUT VIC AB 3-0 SH 8-18 (SUTURE) ×3 IMPLANT
SYR CONTROL 10ML LL (SYRINGE) ×3 IMPLANT
TAPE CLOTH SURG 4X10 WHT LF (GAUZE/BANDAGES/DRESSINGS) ×3 IMPLANT
TOWEL GREEN STERILE (TOWEL DISPOSABLE) ×3 IMPLANT
TOWEL GREEN STERILE FF (TOWEL DISPOSABLE) ×3 IMPLANT
TRAY FOLEY W/METER SILVER 16FR (SET/KITS/TRAYS/PACK) IMPLANT
UNDERPAD 30X30 (UNDERPADS AND DIAPERS) ×3 IMPLANT
VALVE RT ANGLE UNITIZE DIST (Valve) ×3 IMPLANT
WATER STERILE IRR 1000ML POUR (IV SOLUTION) ×3 IMPLANT

## 2017-11-20 NOTE — Op Note (Signed)
Brief history: The patient is a 76 year old white male who has developed memory difficulties, gait ataxia and urinary incontinence.  He was worked up with a head CT which demonstrated hydrocephalus.  I discussed the various treatment options with the patient and his wife including surgery.  He has weighed the risks, benefits, and alternatives of surgery and decided to proceed with placement of a ventriculoperitoneal shunt.  Preop diagnosis: Hydrocephalus  Postop diagnosis: The same  Procedure: Placement of a right retroauricular ventriculoperitoneal shunt (Codman programmable valve set at 80 mmHg)  Surgeon: Dr. Earle Gell  Assistant: Dr. Kary Kos  Anesthesia: General tracheal  Estimated blood loss: Minimal  Specimens: None  Complications: None  Description of procedure: The patient was brought to the operating room by the anesthesia team.  General endotracheal anesthesia was induced.  The patient remained in the supine position.  A roll was placed on his right shoulder.  The patient's head was turned to the left exposing his right scalp.  His right scalp was then shaved with clippers.  This region as well as his neck thorax and abdomen were prepared with Betadine scrub and Betadine solution.  Sterile drapes were applied.  I then injected the area to be incised with Marcaine with epinephrine solution.  I used a scalpel to make a incision in the patient's right upper quadrant of his abdomen.  I used the wheat Charity fundraiser for exposure.  I dissected down to the anterior rectus sheath.  I divided with electrocautery.  I then dissected through the rectus muscle identifying the posterior rectus sheath.  I divided it with the Metzenbaum scissors.  I then identified the peritoneum.  I sized this with the Metzenbaum scissors enter into the peritoneal cavity.  I then made a second incision the patient's right retroauricular scalp.  I used the cerebellar retractor for exposure.  I then created a  subcutaneous pocket for the valve using the forceps.  I then used the shunt passer to pass the peritoneal catheter from the scalp incision down to the abdominal incision.  I then used a high-speed drill to create a right retroauricular bur hole.  I coagulated the underlying dura.  I incised the dura.  I then cannulated the patient's ventricular system with a ventricular catheter on the first pass.  I removed the stylette and threaded the catheter in a bit deeper.  I then shortened the catheter to the appropriate length, approximately 9 cm.  I connected it to the [place] reservoir.  I secured the catheter with a silk suture.  We then pulled the slack out of the peritoneal catheter.  There was good flow of cerebrospinal fluid through the distal end of the peritoneal catheter.  I secured the valve in place with a figure-of-eight 0 Vicryl suture.  We then placed the distal end of the peritoneal catheter into the peritoneum.  We then removed the retractors.  We reapproximated the patient's anterior and posterior rectus sheath with interrupted 0 Vicryl suture.  We reapproximated the patient's subcutaneous tissue with interrupted 2-0 Vicryl suture.  We reapproximate the galea with interrupted 2-0 Vicryl suture.  We reapproximated the skin with Steri-Strips and benzoin in the abdominal incision and staples in the scalp incision.  The wounds were then coated with bacitracin ointment.  A sterile dressing was applied.  The drapes were removed.  By report all sponge, instrument, and needle counts were correct at the end this case.

## 2017-11-20 NOTE — Progress Notes (Signed)
Pharmacy Antibiotic Note  Justin Hill is a 76 y.o. male admitted on 11/20/2017 with hydrocephalus. Now s/p VP shunt. To start vancomycin for 24 hours as post-op prophylaxis. SCr 1.45, afebrile.  Plan: -vancomycin 1 g IV q12h x 2 doses -monitor renal fx, cultures   Height: 5\' 11"  (180.3 cm) Weight: 262 lb 5 oz (119 kg) IBW/kg (Calculated) : 75.3  Temp (24hrs), Avg:97.6 F (36.4 C), Min:97.6 F (36.4 C), Max:97.6 F (36.4 C)  Recent Labs  Lab 11/18/17 1118  WBC 6.4  CREATININE 1.45*    Estimated Creatinine Clearance: 58.7 mL/min (A) (by C-G formula based on SCr of 1.45 mg/dL (H)).    Allergies  Allergen Reactions  . Penicillins Anaphylaxis and Other (See Comments)    SYNCOPE PATIENT HAS HAD A PCN REACTION WITH IMMEDIATE RASH, FACIAL/TONGUE/THROAT SWELLING, SOB, OR LIGHTHEADEDNESS WITH HYPOTENSION:  #  #  #  YES  #  #  #   Has patient had a PCN reaction causing severe rash involving mucus membranes or skin necrosis: no PATIENT HAS HAD A PCN REACTION THAT REQUIRED HOSPITALIZATION:  #  #  #  YES  #  #  #  Has patient had a PCN reaction occurring within the last 10 years: no    . Aspirin Other (See Comments)    Burps, burning, stomach pains, etc  . Lisinopril Cough    Severe coughing    Antimicrobials this admission: 12/28 vancomycin > 12/29  Dose adjustments this admission: N/A  Microbiology results: 12/28 mrsa pcr: ip   Justin Hill 11/20/2017 9:53 PM

## 2017-11-20 NOTE — H&P (Signed)
Subjective: The patient is a 76 year old white male who has had a history of unsteady gait, urinary incontinence, and memory difficulties.  He was worked up with a head CT which demonstrated findings consistent with normal pressure hydrocephalus.  I discussed the various treatment options with the patient and his wife.  He has decided to proceed with placement of a ventriculoperitoneal shunt  Past Medical History:  Diagnosis Date  . Altered mental status   . Cardiomyopathy   . CHF (congestive heart failure) ([place])   . Chronic kidney disease (CKD), stage III (moderate) (HCC)   . Communicating hydrocephalus   . Diabetes mellitus    type 2  . Diverticulosis   . History of kidney stones   . Hypercholesterolemia   . Hyperglycemia   . Hypertension   . Paroxysmal atrial fibrillation (HCC)   . Personal history of kidney stones   . Pneumonia   . Sleep apnea    uses cpap    Past Surgical History:  Procedure Laterality Date  . ATRIAL FLUTTER ABLATION N/A 01/19/2015   Procedure: ATRIAL FLUTTER ABLATION;  Surgeon: [name], MD;  Location: [facility] CATH LAB;  Service: Cardiovascular;  Laterality: N/A;  . CARDIAC CATHETERIZATION N/A 05/02/2016   Procedure: Right/Left Heart Cath and Coronary Angiography;  Surgeon: Larey Dresser, MD;  Location: [facility] CV LAB;  Service: Cardiovascular;  Laterality: N/A;  . COLONOSCOPY N/A 12/29/2013   Procedure: COLONOSCOPY;  Surgeon: Lafayette Dragon, MD;  Location: [facility] ENDOSCOPY;  Service: Endoscopy;  Laterality: N/A;  . EP IMPLANTABLE DEVICE N/A 06/05/2016   MDT Hillery Aldo XT CRTD implanted by Dr Rayann Heman   . EYE SURGERY     cataract surgery (not sure which eye)  . [place]   left. Muscle reattachment  . TOE SURGERY Right 12/2012   2nd toe  . TONSILLECTOMY      Allergies  Allergen Reactions  . Penicillins Anaphylaxis and Other (See Comments)    SYNCOPE PATIENT HAS HAD A PCN REACTION WITH IMMEDIATE RASH, FACIAL/TONGUE/THROAT SWELLING, SOB, OR  LIGHTHEADEDNESS WITH HYPOTENSION:  #  #  #  YES  #  #  #   Has patient had a PCN reaction causing severe rash involving mucus membranes or skin necrosis: no PATIENT HAS HAD A PCN REACTION THAT REQUIRED HOSPITALIZATION:  #  #  #  YES  #  #  #  Has patient had a PCN reaction occurring within the last 10 years: no    . Aspirin Other (See Comments)    Burps, burning, stomach pains, etc  . Lisinopril Cough    Severe coughing    Social History   Tobacco Use  . Smoking status: Former Smoker    Types: Cigarettes  . Smokeless tobacco: Never Used  Substance Use Topics  . Alcohol use: No    Family History  Problem Relation Age of Onset  . Heart failure Maternal Grandmother   . Diabetes type II Sister 35  . Cancer Sister   . Hyperlipidemia Other        Parent  . Stroke Other        Parent  . Hypertension Other        Parent  . Diabetes Other        Parent  . Colon cancer Neg Hx   . Heart attack Neg Hx    Prior to Admission medications   Medication Sig Start Date End Date Taking? Authorizing Provider  Ascorbic Acid (VITAMIN C) 1000  MG tablet Take 1 tablet by mouth 2 (two) times daily.    Yes [provider]  carvedilol (COREG) 25 MG tablet Take 25 mg by mouth 2 (two) times daily with a meal.   Yes [provider]  digoxin (LANOXIN) 0.125 MG tablet TAKE ONE TABLET BY MOUTH ONCE DAILY 02/04/17  Yes Larey Dresser, MD  ENTRESTO 97-103 MG TAKE ONE TABLET BY MOUTH TWICE DAILY 04/21/17  Yes Chanetta Marshall K, NP  fenofibrate 54 MG tablet TAKE 1 TABLET BY MOUTH ONCE DAILY 10/09/17  Yes Larey Dresser, MD  insulin NPH-regular Human (NOVOLIN 70/30) (70-30) 100 UNIT/ML injection 90 units with breakfast, and 60 units with supper, and syringes 2/day 10/28/17  Yes Renato Shin, MD  loratadine (CLARITIN) 10 MG tablet Take 1 tablet by mouth 2 (two) times daily.    Yes [provider]  Multiple Vitamin (MULTIVITAMIN) tablet Take 1 tablet by mouth daily.     Yes [provider]  simvastatin (ZOCOR) 40 MG tablet Take 40 mg by mouth at bedtime.     Yes [provider]  tamsulosin (FLOMAX) 0.4 MG CAPS capsule Take 0.4 mg by mouth daily.    Yes [provider]  torsemide (DEMADEX) 10 MG tablet Take 1 tablet (10 mg total) by mouth every other day. 02/09/17  Yes Velvet Bathe, MD  torsemide (DEMADEX) 10 MG tablet Take 1 tablet (10 mg total) by mouth every other day. 11/06/17  Yes Bensimhon, Shaune Pascal, MD  XARELTO 20 MG TABS tablet TAKE ONE TABLET BY MOUTH ONCE DAILY WITH  SUPPER 02/04/17   Larey Dresser, MD     Review of Systems  Positive ROS: As above  All other systems have been reviewed and were otherwise negative with the exception of those mentioned in the HPI and as above.  Objective: Vital signs in last 24 hours: Pulse Rate:  [72] 72 (12/28 1249) Resp:  [18] 18 (12/28 1249) BP: (131)/(59) 131/59 (12/28 1249) SpO2:  [98 %] 98 % (12/28 1249) Weight:  [119 kg (262 lb 5 oz)] 119 kg (262 lb 5 oz) (12/28 1249)  General Appearance: Alert Head: Normocephalic, without obvious abnormality, atraumatic Eyes: PERRL, conjunctiva/corneas clear, EOM's intact,    Ears: Normal  Throat: Normal  Neck: Supple, Back: unremarkable Lungs: Clear to auscultation bilaterally, respirations unlabored Heart: Regular rate and rhythm, no murmur, rub or gallop Abdomen: Soft, non-tender Extremities: Extremities normal, atraumatic, no cyanosis or edema Skin: unremarkable  NEUROLOGIC:   Mental status: alert and oriented, poor memory Motor Exam - grossly normal Sensory Exam - grossly normal Reflexes:  Coordination - grossly normal Gait -unsteady  balance - grossly normal Cranial Nerves: I: smell Not tested  II: visual acuity  OS: Normal  OD: Normal   II: visual fields Full to confrontation  II: pupils Equal, round, reactive to light  III,VII: ptosis None  III,IV,VI: extraocular muscles  Full ROM  V: mastication Normal  V: facial light touch  sensation  Normal  V,VII: corneal reflex  Present  VII: facial muscle function - upper  Normal  VII: facial muscle function - lower Normal  VIII: hearing Not tested  IX: soft palate elevation  Normal  IX,X: gag reflex Present  XI: trapezius strength  5/5  XI: sternocleidomastoid strength 5/5  XI: neck flexion strength  5/5  XII: tongue strength  Normal    Data Review Lab Results  Component Value Date   WBC 6.4 11/18/2017   HGB 14.3 11/18/2017  HCT 42.8 11/18/2017   MCV 92.4 11/18/2017   PLT 142 (L) 11/18/2017   Lab Results  Component Value Date   NA 139 11/18/2017   K 4.2 11/18/2017   CL 107 11/18/2017   CO2 22 11/18/2017   BUN 12 11/18/2017   CREATININE 1.45 (H) 11/18/2017   GLUCOSE 118 (H) 11/18/2017   Lab Results  Component Value Date   INR 0.98 11/20/2017    Assessment/Plan: Normal pressure hydrocephalus: I have discussed the situation with the patient and his wife.  We have discussed the various treatment options including surgery.  I have described the surgical treatment option of placement of a ventriculoperitoneal shunt.  I have given him a hydrocephalus pamphlet.  We have discussed the risks, benefits, alternatives, expected postoperative course, and likelihood of achieving our goals with surgery.  I have answered all their questions.  He has decided to proceed with surgery.   Ophelia Charter 11/20/2017 3:06 PM

## 2017-11-20 NOTE — Anesthesia Procedure Notes (Signed)
Procedure Name: Intubation Date/Time: 11/20/2017 3:56 PM Performed by: Imagene Riches, CRNA Pre-anesthesia Checklist: Patient identified, Emergency Drugs available, Suction available and Patient being monitored Patient Re-evaluated:Patient Re-evaluated prior to induction Oxygen Delivery Method: Circle System Utilized Preoxygenation: Pre-oxygenation with 100% oxygen Induction Type: IV induction Ventilation: Mask ventilation without difficulty Laryngoscope Size: 4 and Glidescope Grade View: Grade I Tube type: Oral Tube size: 7.5 mm Number of attempts: 1 Airway Equipment and Method: Stylet and Oral airway Placement Confirmation: ETT inserted through vocal cords under direct vision,  positive ETCO2 and breath sounds checked- equal and bilateral Secured at: 24 cm Tube secured with: Tape Dental Injury: Teeth and Oropharynx as per pre-operative assessment  Comments: First DL by Imagene Riches, CRNA with miller 2, grade 4 view. Patient mask ventilated. Second DL by Dr. Orene Desanctis, miller 2, grade 4 view, attempt to pass ETT unsuccessful. Patient mask ventilated. Third DL with Glide scope 4, grade 1 view, ETT easily passed through cords. VSS throughout.

## 2017-11-20 NOTE — Anesthesia Preprocedure Evaluation (Addendum)
Anesthesia Evaluation  Patient identified by MRN, date of birth, ID band Patient awake    Reviewed: Allergy & Precautions, NPO status , Patient's Chart, lab work & pertinent test results  Airway Mallampati: II  TM Distance: <3 FB Neck ROM: Full    Dental  (+) Missing, Dental Advisory Given, Caps   Pulmonary sleep apnea , former smoker,    breath sounds clear to auscultation       Cardiovascular hypertension, + CAD and +CHF  + dysrhythmias  Rhythm:Regular Rate:Normal  - Left ventricle: The cavity size was normal. There was mild   concentric hypertrophy. Systolic function was mildly to   moderately reduced. The estimated ejection fraction was in the   range of 40% to 45%. Wall motion was normal; there were no   regional wall motion abnormalities. Doppler parameters are   consistent with abnormal left ventricular relaxation (grade 1   diastolic dysfunction). - Ventricular septum: Septal motion showed paradox.  ------------------------------------------------------------------- Labs, prior tests, procedures, and surgery   Neuro/Psych    GI/Hepatic negative GI ROS, Neg liver ROS,   Endo/Other  diabetesMorbid obesity  Renal/GU Renal InsufficiencyRenal disease     Musculoskeletal   Abdominal (+) + obese,   Peds  Hematology   Anesthesia Other Findings   Reproductive/Obstetrics                           Anesthesia Physical Anesthesia Plan  ASA: IV  Anesthesia Plan: General   Post-op Pain Management:    Induction: Intravenous  PONV Risk Score and Plan: 3 and Treatment may vary due to age or medical condition, Dexamethasone and Ondansetron  Airway Management Planned: Oral ETT and Video Laryngoscope Planned  Additional Equipment:   Intra-op Plan:   Post-operative Plan: Extubation in OR and Possible Post-op intubation/ventilation  Informed Consent: I have reviewed the patients History  and Physical, chart, labs and discussed the procedure including the risks, benefits and alternatives for the proposed anesthesia with the patient or authorized representative who has indicated his/her understanding and acceptance.   Dental advisory given  Plan Discussed with: CRNA  Anesthesia Plan Comments:         Anesthesia Quick Evaluation

## 2017-11-20 NOTE — Transfer of Care (Signed)
Immediate Anesthesia Transfer of Care Note  Patient: Justin Hill  Procedure(s) Performed: SHUNT PLACEMENT RIGHT (Right )  Patient Location: PACU  Anesthesia Type:General  Level of Consciousness: drowsy and patient cooperative  Airway & Oxygen Therapy: Patient Spontanous Breathing and Patient connected to nasal cannula oxygen  Post-op Assessment: Report given to RN and Post -op Vital signs reviewed and stable  Post vital signs: Reviewed and stable  Last Vitals:  Vitals:   11/20/17 1249  BP: (!) 131/59  Pulse: 72  Resp: 18  SpO2: 98%    Last Pain:  Vitals:   11/20/17 1249  TempSrc: Oral      Patients Stated Pain Goal: 3 (23/76/28 3151)  Complications: No apparent anesthesia complications

## 2017-11-20 NOTE — Progress Notes (Signed)
Received patient via bed from PACU, patient is alert and orientated and is orientated to the room and call system. Patient orders are reviewed with patient and family, Kathlee Nations wife. Patient and wife verbalized understanding.

## 2017-11-21 LAB — GLUCOSE, CAPILLARY
GLUCOSE-CAPILLARY: 242 mg/dL — AB (ref 65–99)
GLUCOSE-CAPILLARY: 207 mg/dL — AB (ref 65–99)

## 2017-11-21 MED ORDER — HYDROCODONE-ACETAMINOPHEN 5-325 MG PO TABS
1.0000 | ORAL_TABLET | ORAL | 0 refills | Status: DC | PRN
Start: 2017-11-21 — End: 2018-04-08

## 2017-11-21 NOTE — Discharge Summary (Signed)
Date of Admission: 11/20/2017  Date of Discharge: 11/21/17  Admission Diagnosis: Normal pressure hydrocephalus  Discharge Diagnosis: Same  Procedure Performed: Ventriculoperitoneal shunt placement  Attending: Newman Pies, MD  Hospital Course:  The patient was admitted for the above listed operation and had an uncomplicated post-operative course.  They were discharged in stable condition.  Follow up: 3 weeks  Patient instructed to resume Xarelto in 5 days  Allergies as of 11/21/2017      Reactions   Penicillins Anaphylaxis, Other (See Comments)   SYNCOPE PATIENT HAS HAD A PCN REACTION WITH IMMEDIATE RASH, FACIAL/TONGUE/THROAT SWELLING, SOB, OR LIGHTHEADEDNESS WITH HYPOTENSION:  #  #  #  YES  #  #  #   Has patient had a PCN reaction causing severe rash involving mucus membranes or skin necrosis: no PATIENT HAS HAD A PCN REACTION THAT REQUIRED HOSPITALIZATION:  #  #  #  YES  #  #  #  Has patient had a PCN reaction occurring within the last 10 years: no   Aspirin Other (See Comments)   Burps, burning, stomach pains, etc   Lisinopril Cough   Severe coughing      Medication List    TAKE these medications   carvedilol 25 MG tablet Commonly known as:  COREG Take 25 mg by mouth 2 (two) times daily with a meal.   CLARITIN 10 MG tablet Generic drug:  loratadine Take 1 tablet by mouth 2 (two) times daily.   digoxin 0.125 MG tablet Commonly known as:  LANOXIN TAKE ONE TABLET BY MOUTH ONCE DAILY   ENTRESTO 97-103 MG Generic drug:  sacubitril-valsartan TAKE ONE TABLET BY MOUTH TWICE DAILY   fenofibrate 54 MG tablet TAKE 1 TABLET BY MOUTH ONCE DAILY   HYDROcodone-acetaminophen 5-325 MG tablet Commonly known as:  NORCO/VICODIN Take 1 tablet by mouth every 4 (four) hours as needed for moderate pain.   insulin NPH-regular Human (70-30) 100 UNIT/ML injection Commonly known as:  NOVOLIN 70/30 90 units with breakfast, and 60 units with supper, and syringes 2/day    multivitamin tablet Take 1 tablet by mouth daily.   simvastatin 40 MG tablet Commonly known as:  ZOCOR Take 40 mg by mouth at bedtime.   tamsulosin 0.4 MG Caps capsule Commonly known as:  FLOMAX Take 0.4 mg by mouth daily.   torsemide 10 MG tablet Commonly known as:  DEMADEX Take 1 tablet (10 mg total) by mouth every other day.   torsemide 10 MG tablet Commonly known as:  DEMADEX Take 1 tablet (10 mg total) by mouth every other day.   vitamin C 1000 MG tablet Take 1 tablet by mouth 2 (two) times daily.   XARELTO 20 MG Tabs tablet Generic drug:  rivaroxaban TAKE ONE TABLET BY MOUTH ONCE DAILY WITH  SUPPER

## 2017-11-21 NOTE — Progress Notes (Signed)
Patient stated that he can see pattern in wall paper moving. Patient is alert and orientated. Paged neurosurgery on call regarding change in vision.

## 2017-11-21 NOTE — Progress Notes (Addendum)
Pt seen and examined. No issues overnight.  EXAM: Temp:  [97.5 F (36.4 C)-97.9 F (36.6 C)] 97.6 F (36.4 C) (12/29 0800) Pulse Rate:  [66-88] 88 (12/29 0800) Resp:  [14-24] 24 (12/29 0800) BP: (97-143)/(45-76) 120/64 (12/29 0800) SpO2:  [88 %-99 %] 94 % (12/29 0800) Weight:  [118.9 kg (262 lb 2 oz)-119 kg (262 lb 5 oz)] 118.9 kg (262 lb 2 oz) (12/28 1940) Intake/Output      12/28 0701 - 12/29 0700 12/29 0701 - 12/30 0700   I.V. (mL/kg) 1280 (10.8)    IV Piggyback 200    Total Intake(mL/kg) 1480 (12.4)    Urine (mL/kg/hr) 1675 425 (1.5)   Blood 75    Total Output 1750 425   Net -270 -425         Awake and alert Follows commands throughout Full strength Cranial incision looks good, abdominal bandage clean and dry  Stable D/c Instructed patient to resume Xarelto in 5 days

## 2017-11-21 NOTE — Anesthesia Postprocedure Evaluation (Signed)
Anesthesia Post Note  Patient: Justin Hill  Procedure(s) Performed: SHUNT PLACEMENT RIGHT (Right )     Patient location during evaluation: PACU Anesthesia Type: General Level of consciousness: awake and alert Pain management: pain level controlled Vital Signs Assessment: post-procedure vital signs reviewed and stable Respiratory status: spontaneous breathing, nonlabored ventilation, respiratory function stable and patient connected to nasal cannula oxygen Cardiovascular status: blood pressure returned to baseline and stable Postop Assessment: no apparent nausea or vomiting Anesthetic complications: no    Last Vitals:  Vitals:   11/21/17 0358 11/21/17 0800  BP: (!) 124/56 120/64  Pulse: 80 88  Resp: 15 (!) 24  Temp: 36.6 C   SpO2: 98% 94%    Last Pain:  Vitals:   11/21/17 0358  TempSrc: Oral  PainSc:                  Audry Pili

## 2017-11-23 ENCOUNTER — Encounter (HOSPITAL_COMMUNITY): Payer: Self-pay | Admitting: Neurosurgery

## 2017-11-25 ENCOUNTER — Encounter (HOSPITAL_COMMUNITY): Payer: Self-pay | Admitting: Neurosurgery

## 2017-12-07 ENCOUNTER — Ambulatory Visit (INDEPENDENT_AMBULATORY_CARE_PROVIDER_SITE_OTHER): Payer: Medicare Other

## 2017-12-07 DIAGNOSIS — I5022 Chronic systolic (congestive) heart failure: Secondary | ICD-10-CM | POA: Diagnosis not present

## 2017-12-07 DIAGNOSIS — Z95810 Presence of automatic (implantable) cardiac defibrillator: Secondary | ICD-10-CM | POA: Diagnosis not present

## 2017-12-08 ENCOUNTER — Telehealth: Payer: Self-pay

## 2017-12-08 NOTE — Telephone Encounter (Signed)
Remote ICM transmission received.  Attempted call to patient and left detailed message per DPR regarding transmission and next ICM scheduled for 01/07/2018.  Advised to return call for any fluid symptoms or questions.

## 2017-12-08 NOTE — Progress Notes (Signed)
EPIC Encounter for ICM Monitoring  Patient Name: Justin Hill is a 77 y.o. male Date: 12/08/2017 Primary Care Physican: Cyndy Freeze, MD Primary Cardiologist:[place] Electrophysiologist: Allred Dry Weight:Previous weight 255 lbs  Bi-V Pacing: 96.8%   Clinical Status (03-Nov-2017 to 07-Dec-2017) Treated VT/VF 0 episodes  AT/AF 84 episodes  Time in AT/AF 0.4 hr/day (1.6%)      Attempted call to patient and unable to reach.  Left detailed message regarding transmission.  Transmission reviewed.  Patient had surgery for insertion of VP shunt.    Thoracic impedance normal.  Prescribed dosage: Torsemide 10 mg 1 tablet every other day.   Labs: 11/18/2017 Creatinine 1.45, BUN 12, Potassium 4.2, Sodium 139, EGFR 46-53 05/29/2017 Creatinine 1.32, BUN 13, Potassium 4.7, Sodium 134, EGFR 51-60 03/24/2017 Creatinine 1.47, BUN 25, Potassium 5.2, Sodium 133, EGFR 46-54 01/09/2017 Creatinine 1.42, BUN 15, Potassium 4.6, Sodium 136, EGFR 47-55 12/22/2016 Creatinine 1.56, BUN 22, Potassium 5.0, Sodium 138, EGFR 43-50 12/02/2016 Creatinine 1.20, BUN 14, Potassium 4.9, Sodium 138, EGFR >60 10/03/2016 Creatinine 1.27, BUN 16, Potassium 4.9, Sodium 135, EGFR 54->60  08/01/2016 Creatinine 1.21, BUN 16, Potassium 5.0, Sodium 138, EGFR 59-68  07/23/2016 Creatinine 1.52, BUN 19, Potassium 4.5, Sodium 136  05/29/2016 Creatinine 1.26, BUN 15, Potassium 4.6, Sodium 141  04/30/2016 Creatinine 1.17, BUN 9, Potassium 4.9, Sodium 138  12/28/2015 Creatinine 1.35, BUN 13, Potassium 4.9, Sodium 142  11/30/2016Creatinine 1.37, BUN 20, Potassium 4.3, Sodium 139   Recommendations: Left voice mail with ICM number and encouraged to call if experiencing any fluid symptoms.  Follow-up plan: ICM clinic phone appointment on 01/07/2018.  Office appointment scheduled 01/18/2018 with Dr. Aundra Dubin.  Copy of ICM check sent to Dr. Rayann Heman.   3 month ICM trend: 12/07/2017    AT/AF    1 Year ICM trend:        Rosalene Billings, RN 12/08/2017 11:59 AM

## 2018-01-01 ENCOUNTER — Ambulatory Visit: Payer: Medicare Other | Admitting: Endocrinology

## 2018-01-06 ENCOUNTER — Ambulatory Visit (INDEPENDENT_AMBULATORY_CARE_PROVIDER_SITE_OTHER): Payer: Medicare Other | Admitting: Endocrinology

## 2018-01-06 ENCOUNTER — Encounter: Payer: Self-pay | Admitting: Endocrinology

## 2018-01-06 VITALS — BP 138/74 | HR 83 | Wt 263.0 lb

## 2018-01-06 DIAGNOSIS — Z794 Long term (current) use of insulin: Secondary | ICD-10-CM | POA: Diagnosis not present

## 2018-01-06 DIAGNOSIS — E1122 Type 2 diabetes mellitus with diabetic chronic kidney disease: Secondary | ICD-10-CM | POA: Diagnosis not present

## 2018-01-06 DIAGNOSIS — N183 Chronic kidney disease, stage 3 unspecified: Secondary | ICD-10-CM

## 2018-01-06 LAB — POCT GLYCOSYLATED HEMOGLOBIN (HGB A1C): HEMOGLOBIN A1C: 7.8

## 2018-01-06 MED ORDER — INSULIN NPH ISOPHANE & REGULAR (70-30) 100 UNIT/ML ~~LOC~~ SUSP: SUBCUTANEOUS | 11 refills | Status: DC

## 2018-01-06 NOTE — Patient Instructions (Addendum)
Please continue 110 units with breakfast, and 80 units with supper.    On this type of insulin schedule, you should eat meals on a regular schedule.  If a meal is missed or significantly.  delayed, your blood sugar could go low.  check your blood sugar twice a day.  vary the time of day when you check, between before the 3 meals, and at bedtime.  also check if you have symptoms of your blood sugar being too high or too low.  please keep a record of the readings and bring it to your next appointment here.  please call us sooner if your blood sugar goes below 70, or if you have a lot of readings over 200.   If you have any blood sugar under 80, please write on the paper why you think that was.   Please come back for a follow-up appointment in 3 months.

## 2018-01-06 NOTE — Progress Notes (Signed)
Subjective:    Patient ID: Justin Hill, male    DOB: 1940/12/17, 77 y.o.   MRN: [id number]  HPI Pt returns for f/u of diabetes mellitus: DM type: Insulin-requiring type 2. Dx'ed: 0175 Complications: CHF, toe ulcer, partial toe amputation, polyneuropathy, and renal insufficiency.  Therapy: insulin since 2012.  DKA: never.   Severe hypoglycemia: never.    Pancreatitis: never.   Other: he takes human insulin for cost reasons; in October of 2014, he was changed from multiple daily injections to BID, due to persistently high a1c.  The pattern of cbg's indicates he needs NPH in AM and 70/30 with dinner; wife gives insulin, due to memory loss Interval history: he takes 110 units qam and 80 units qpm.  wife brings a record of her cbg's which I have reviewed today. It varies from 94-200.  There is no trend throughout the day.  pt states he feels well in general.  No recent steroids.  He recently had a V-P shunt placed, which has helped memory somewhat.   Past Medical History:  Diagnosis Date  . Altered mental status   . Cardiomyopathy   . CHF (congestive heart failure) ([place])   . Chronic kidney disease (CKD), stage III (moderate) ([facility])   . Communicating hydrocephalus   . Diabetes mellitus    type 2  . Diverticulosis   . History of kidney stones   . Hypercholesterolemia   . Hyperglycemia   . Hypertension   . Paroxysmal atrial fibrillation ([facility])   . Personal history of kidney stones   . Pneumonia   . Sleep apnea    uses cpap    Past Surgical History:  Procedure Laterality Date  . ATRIAL FLUTTER ABLATION N/A 01/19/2015   Procedure: ATRIAL FLUTTER ABLATION;  Surgeon: [name], MD;  Location: [facility] CATH LAB;  Service: Cardiovascular;  Laterality: N/A;  . CARDIAC CATHETERIZATION N/A 05/02/2016   Procedure: Right/Left Heart Cath and Coronary Angiography;  Surgeon: Larey Dresser, MD;  Location: [facility] CV LAB;  Service: Cardiovascular;  Laterality: N/A;  . COLONOSCOPY N/A 12/29/2013    Procedure: COLONOSCOPY;  Surgeon: Lafayette Dragon, MD;  Location: [facility] ENDOSCOPY;  Service: Endoscopy;  Laterality: N/A;  . EP IMPLANTABLE DEVICE N/A 06/05/2016   MDT Hillery Aldo XT CRTD implanted by Dr Rayann Heman   . EYE SURGERY     cataract surgery (not sure which eye)  . [place]   left. Muscle reattachment  . TOE SURGERY Right 12/2012   2nd toe  . TONSILLECTOMY    . VENTRICULOPERITONEAL SHUNT Right 11/20/2017   Procedure: SHUNT PLACEMENT RIGHT;  Surgeon: Newman Pies, MD;  Location: [place];  Service: Neurosurgery;  Laterality: Right;  SHUNT PLACEMENT RIGHT    Social History   Socioeconomic History  . Marital status: Married    Spouse name: Rosemarie Beath  . Number of children: 1  . Years of education: 84  . Highest education level: Not on file  Social Needs  . Financial resource strain: Not on file  . Food insecurity - worry: Not on file  . Food insecurity - inability: Not on file  . Transportation needs - medical: Not on file  . Transportation needs - non-medical: Not on file  Occupational History  . Occupation: retired  Tobacco Use  . Smoking status: Former Smoker    Types: Cigarettes  . Smokeless tobacco: Never Used  Substance and Sexual Activity  . Alcohol use: No  . Drug use: No  .  Sexual activity: Not on file  Other Topics Concern  . Not on file  Social History Narrative   Regular exercise-no   Rare caffeine use     Current Outpatient Medications on File Prior to Visit  Medication Sig Dispense Refill  . Ascorbic Acid (VITAMIN C) 1000 MG tablet Take 1 tablet by mouth 2 (two) times daily.     . carvedilol (COREG) 25 MG tablet Take 25 mg by mouth 2 (two) times daily with a meal.    . digoxin (LANOXIN) 0.125 MG tablet TAKE ONE TABLET BY MOUTH ONCE DAILY 90 tablet 3  . ENTRESTO 97-103 MG TAKE ONE TABLET BY MOUTH TWICE DAILY 180 tablet 3  . fenofibrate 54 MG tablet TAKE 1 TABLET BY MOUTH ONCE DAILY 30 tablet 3  . loratadine (CLARITIN) 10 MG tablet Take 1  tablet by mouth 2 (two) times daily.     . Multiple Vitamin (MULTIVITAMIN) tablet Take 1 tablet by mouth daily.      . simvastatin (ZOCOR) 40 MG tablet Take 40 mg by mouth at bedtime.      . tamsulosin (FLOMAX) 0.4 MG CAPS capsule Take 0.4 mg by mouth daily.     Marland Kitchen torsemide (DEMADEX) 10 MG tablet Take 1 tablet (10 mg total) by mouth every other day. 30 tablet 3  . torsemide (DEMADEX) 10 MG tablet Take 1 tablet (10 mg total) by mouth every other day. 15 tablet 3  . XARELTO 20 MG TABS tablet TAKE ONE TABLET BY MOUTH ONCE DAILY WITH  SUPPER 90 tablet 3  . HYDROcodone-acetaminophen (NORCO/VICODIN) 5-325 MG tablet Take 1 tablet by mouth every 4 (four) hours as needed for moderate pain. (Patient not taking: Reported on 01/06/2018) 30 tablet 0   No current facility-administered medications on file prior to visit.     Allergies  Allergen Reactions  . Penicillins Anaphylaxis and Other (See Comments)    SYNCOPE PATIENT HAS HAD A PCN REACTION WITH IMMEDIATE RASH, FACIAL/TONGUE/THROAT SWELLING, SOB, OR LIGHTHEADEDNESS WITH HYPOTENSION:  #  #  #  YES  #  #  #   Has patient had a PCN reaction causing severe rash involving mucus membranes or skin necrosis: no PATIENT HAS HAD A PCN REACTION THAT REQUIRED HOSPITALIZATION:  #  #  #  YES  #  #  #  Has patient had a PCN reaction occurring within the last 10 years: no    . Aspirin Other (See Comments)    Burps, burning, stomach pains, etc  . Lisinopril Cough    Severe coughing    Family History  Problem Relation Age of Onset  . Heart failure Maternal Grandmother   . Diabetes type II Sister 19  . Cancer Sister   . Hyperlipidemia Other        Parent  . Stroke Other        Parent  . Hypertension Other        Parent  . Diabetes Other        Parent  . Colon cancer Neg Hx   . Heart attack Neg Hx     BP 138/74 (BP Location: Left Arm, Patient Position: Sitting, Cuff Size: Normal)   Pulse 83   Wt 263 lb (119.3 kg)   SpO2 97%   BMI 36.68 kg/m     Review of Systems He denies hypoglycemia.     Objective:   Physical Exam VITAL SIGNS:  See vs page. GENERAL: no distress.  Pulses: foot pulses are intact bilaterally.  MSK: no deformity, except for hammer toes on the left foot. right 2nd toe is surgically absent.  CV: 1+ bilat edema of the legs.  Skin:  no ulcer on the feet or ankles, but the skin is very dry.  normal temp on the feet and ankles. There is bilateral onychomycosis, rust-colored discoloration, and varicosities.  Neuro: sensation is intact to touch on the feet and ankles, but decreased from normal.    Lab Results  Component Value Date   HGBA1C 7.8 01/06/2018       Assessment & Plan:  Insulin-requiring type 2 DM: this is the best control this pt should aim for, given this regimen, which does match insulin to his changing needs throughout the day.   Patient Instructions  Please continue 110 units with breakfast, and 80 units with supper.    On this type of insulin schedule, you should eat meals on a regular schedule.  If a meal is missed or significantly.  delayed, your blood sugar could go low.  check your blood sugar twice a day.  vary the time of day when you check, between before the 3 meals, and at bedtime.  also check if you have symptoms of your blood sugar being too high or too low.  please keep a record of the readings and bring it to your next appointment here.  please call us sooner if your blood sugar goes below 70, or if you have a lot of readings over 200.   If you have any blood sugar under 80, please write on the paper why you think that was.   Please come back for a follow-up appointment in 3 months.

## 2018-01-07 ENCOUNTER — Ambulatory Visit (INDEPENDENT_AMBULATORY_CARE_PROVIDER_SITE_OTHER): Payer: Medicare Other

## 2018-01-07 DIAGNOSIS — I5022 Chronic systolic (congestive) heart failure: Secondary | ICD-10-CM | POA: Diagnosis not present

## 2018-01-07 DIAGNOSIS — Z95810 Presence of automatic (implantable) cardiac defibrillator: Secondary | ICD-10-CM | POA: Diagnosis not present

## 2018-01-07 NOTE — Progress Notes (Signed)
EPIC Encounter for ICM Monitoring  Patient Name: Justin Hill is a 77 y.o. male Date: 01/07/2018 Primary Care Physican: Cyndy Freeze, MD Primary Cardiologist:[facility] Electrophysiologist: Allred Dry Weight:268 lbs  Bi-V Pacing: 96.9%     Clinical Status (07-Dec-2017 to 07-Jan-2018) Treated VT/VF 0 episodes AT/AF 15 episodes  Time in AT/AF 0.1 hr/day (0.5%)  Observations (2) (07-Dec-2017 to 07-Jan-2018)  Night heart rate over 85 bpm for 7 days.  Patient Activity less than 1 hr/day for 4 weeks.         Spoke with wife.  Heart Failure questions reviewed, pt asymptomatic.   He has been doing better since having VP shunt inserted 11/20/2017.  He is still walking slow but no longer shuffling feet   Thoracic impedance slightly below baseline normal.  Prescribed dosage: Torsemide 10 mg 1 tablet every other day.   Labs: 11/18/2017 Creatinine 1.45, BUN 12, Potassium 4.2, Sodium 139, EGFR 46-53 05/29/2017 Creatinine 1.32, BUN 13, Potassium 4.7, Sodium 134, EGFR 51-60 03/24/2017 Creatinine 1.47, BUN 25, Potassium 5.2, Sodium 133, EGFR 46-54 01/09/2017 Creatinine 1.42, BUN 15, Potassium 4.6, Sodium 136, EGFR 47-55 12/22/2016 Creatinine 1.56, BUN 22, Potassium 5.0, Sodium 138, EGFR 43-50 12/02/2016 Creatinine 1.20, BUN 14, Potassium 4.9, Sodium 138, EGFR >60 10/03/2016 Creatinine 1.27, BUN 16, Potassium 4.9, Sodium 135, EGFR 54->60  08/01/2016 Creatinine 1.21, BUN 16, Potassium 5.0, Sodium 138, EGFR 59-68  07/23/2016 Creatinine 1.52, BUN 19, Potassium 4.5, Sodium 136  05/29/2016 Creatinine 1.26, BUN 15, Potassium 4.6, Sodium 141  04/30/2016 Creatinine 1.17, BUN 9, Potassium 4.9, Sodium 138  12/28/2015 Creatinine 1.35, BUN 13, Potassium 4.9, Sodium 142  11/30/2016Creatinine 1.37, BUN 20, Potassium 4.3, Sodium 139   Recommendations: No changes.  Advised to limit salt.  Encouraged to call for fluid symptoms.  Follow-up plan: ICM clinic phone appointment on 02/09/2018.   Office appointment scheduled 01/18/2018 with Dr. Aundra Dubin.  Copy of ICM check sent to Dr. Rayann Heman and Dr. Aundra Dubin.   3 month ICM trend: 01/07/2018    1 Year ICM trend:       Rosalene Billings, RN 01/07/2018 2:58 PM

## 2018-01-18 ENCOUNTER — Encounter (HOSPITAL_COMMUNITY): Payer: Medicare Other | Admitting: Cardiology

## 2018-01-20 DIAGNOSIS — J069 Acute upper respiratory infection, unspecified: Secondary | ICD-10-CM | POA: Diagnosis not present

## 2018-01-20 DIAGNOSIS — R05 Cough: Secondary | ICD-10-CM | POA: Diagnosis not present

## 2018-01-20 DIAGNOSIS — Z6835 Body mass index (BMI) 35.0-35.9, adult: Secondary | ICD-10-CM | POA: Diagnosis not present

## 2018-01-20 DIAGNOSIS — J101 Influenza due to other identified influenza virus with other respiratory manifestations: Secondary | ICD-10-CM | POA: Diagnosis not present

## 2018-02-01 ENCOUNTER — Encounter (HOSPITAL_COMMUNITY): Payer: Self-pay

## 2018-02-01 ENCOUNTER — Ambulatory Visit (HOSPITAL_COMMUNITY)
Admission: RE | Admit: 2018-02-01 | Discharge: 2018-02-01 | Disposition: A | Discharge status: Home / Self Care | Payer: Medicare Other | Source: Ambulatory Visit | Attending: Cardiology | Admitting: Cardiology

## 2018-02-01 VITALS — BP 112/60 | HR 76 | Wt 260.6 lb

## 2018-02-01 DIAGNOSIS — Z95810 Presence of automatic (implantable) cardiac defibrillator: Secondary | ICD-10-CM | POA: Diagnosis not present

## 2018-02-01 DIAGNOSIS — I480 Paroxysmal atrial fibrillation: Secondary | ICD-10-CM

## 2018-02-01 DIAGNOSIS — I4891 Unspecified atrial fibrillation: Secondary | ICD-10-CM | POA: Insufficient documentation

## 2018-02-01 DIAGNOSIS — E1122 Type 2 diabetes mellitus with diabetic chronic kidney disease: Secondary | ICD-10-CM | POA: Insufficient documentation

## 2018-02-01 DIAGNOSIS — E669 Obesity, unspecified: Secondary | ICD-10-CM | POA: Insufficient documentation

## 2018-02-01 DIAGNOSIS — E1142 Type 2 diabetes mellitus with diabetic polyneuropathy: Secondary | ICD-10-CM | POA: Insufficient documentation

## 2018-02-01 DIAGNOSIS — I429 Cardiomyopathy, unspecified: Secondary | ICD-10-CM | POA: Insufficient documentation

## 2018-02-01 DIAGNOSIS — Z79899 Other long term (current) drug therapy: Secondary | ICD-10-CM | POA: Insufficient documentation

## 2018-02-01 DIAGNOSIS — I5022 Chronic systolic (congestive) heart failure: Secondary | ICD-10-CM | POA: Diagnosis not present

## 2018-02-01 DIAGNOSIS — I4892 Unspecified atrial flutter: Secondary | ICD-10-CM | POA: Diagnosis not present

## 2018-02-01 DIAGNOSIS — I428 Other cardiomyopathies: Secondary | ICD-10-CM

## 2018-02-01 DIAGNOSIS — G4733 Obstructive sleep apnea (adult) (pediatric): Secondary | ICD-10-CM | POA: Diagnosis not present

## 2018-02-01 DIAGNOSIS — I447 Left bundle-branch block, unspecified: Secondary | ICD-10-CM | POA: Diagnosis not present

## 2018-02-01 DIAGNOSIS — I2510 Atherosclerotic heart disease of native coronary artery without angina pectoris: Secondary | ICD-10-CM | POA: Insufficient documentation

## 2018-02-01 DIAGNOSIS — Z79891 Long term (current) use of opiate analgesic: Secondary | ICD-10-CM | POA: Diagnosis not present

## 2018-02-01 DIAGNOSIS — E785 Hyperlipidemia, unspecified: Secondary | ICD-10-CM | POA: Insufficient documentation

## 2018-02-01 DIAGNOSIS — Z7901 Long term (current) use of anticoagulants: Secondary | ICD-10-CM | POA: Diagnosis not present

## 2018-02-01 DIAGNOSIS — N189 Chronic kidney disease, unspecified: Secondary | ICD-10-CM | POA: Insufficient documentation

## 2018-02-01 DIAGNOSIS — I130 Hypertensive heart and chronic kidney disease with heart failure and stage 1 through stage 4 chronic kidney disease, or unspecified chronic kidney disease: Secondary | ICD-10-CM | POA: Insufficient documentation

## 2018-02-01 DIAGNOSIS — Z87891 Personal history of nicotine dependence: Secondary | ICD-10-CM | POA: Insufficient documentation

## 2018-02-01 DIAGNOSIS — Z9889 Other specified postprocedural states: Secondary | ICD-10-CM | POA: Diagnosis not present

## 2018-02-01 DIAGNOSIS — Z794 Long term (current) use of insulin: Secondary | ICD-10-CM | POA: Insufficient documentation

## 2018-02-01 LAB — DIGOXIN LEVEL: DIGOXIN LVL: 0.4 ng/mL — AB (ref 0.8–2.0)

## 2018-02-01 LAB — BASIC METABOLIC PANEL
ANION GAP: 10 (ref 5–15)
BUN: 10 mg/dL (ref 6–20)
CHLORIDE: 101 mmol/L (ref 101–111)
CO2: 26 mmol/L (ref 22–32)
Calcium: 8.8 mg/dL — ABNORMAL LOW (ref 8.9–10.3)
Creatinine, Ser: 1.42 mg/dL — ABNORMAL HIGH (ref 0.61–1.24)
GFR, EST AFRICAN AMERICAN: 55 mL/min — AB (ref >60)
GFR, EST NON AFRICAN AMERICAN: 47 mL/min — AB (ref >60)
Glucose, Bld: 182 mg/dL — ABNORMAL HIGH (ref 65–99)
POTASSIUM: 4.5 mmol/L (ref 3.5–5.1)
SODIUM: 137 mmol/L (ref 135–145)

## 2018-02-01 NOTE — Progress Notes (Signed)
Patient ID: Justin Hill, male   DOB: Aug 06, 1941, 77 y.o.   MRN: [id number] PCP: Dr. Camillia Herter Cardiology: Dr. Aundra Dubin  77 yo with history CKD, HTN, diabetes, chronic LBBB, and nonischemic cardiomyopathy presents for followup of CHF.  His cardiomyopathy has been known for years.  He had a LHC in 2008 showing mild nonobstructive CAD.     Justin Hill was admitted in 2/16 with atrial flutter degenerating into atrial fibrillation.  He was initially cardioverted, then atrial flutter recurred.  He had atrial flutter ablation by Dr Rayann Heman in 2/16.  He thinks that he has been in NSR since that time, no further palpitations. He is on Xarelto now with no melena or BRBPR.    He is s/p placement of Medtronic CRT-D device.  Echo in 1/18 showed improvement in EF to 40-45%.    [name].  His gait is now shuffling. There is some concern for normal pressure hydrocephalus and he has been referred to a neurosurgeon. He underwent ventriculoperitoneal shunt 10/2018.   Today he returns for HF follow up. Recently treated for the flu but feeling much better. Overall feeling fine. Denies SOB/PND/Orthopnea. No bleeding problems. Appetite ok. No fever or chills. Weight at home has been stable. Eating high salt snacks. Taking all medications.   Optivol: Fluid well below threshold. Activity ~1 hour per day. No VT/AF  Labs (5/14): K 4, creatinine 1.4, LFTs normal, LDL 46, HDL 37 Labs (1/15): K 4.4, creatinine 1.46, LDL 74, HDL 35 Labs (4/15); LDL 96, HDL 44, K 5.2, creatinine 1.4 Labs (1/16): K 4.9, creatinine 1.36 Labs (2/16): K 4.5, creatinine 1.56, BNP 412 Labs (9/16): K 4.8, creatinine 1.33, hgb 14.7 Labs (11/16): K 4.3, creatinine 1.37, BNP 52 Labs (1/17): K 4.5 => 5, creatinine 1.35 => 1.17, HCT 47.4 Labs (2/17): K 4.9, creatinine 1.35 Labs (5/17): LDL 48, LFTs normal Labs (9/17): K 5, creatinine 1.21 Labs (11/17): HCT 41.4, digoxin 0.3, K 4.9, creatinine 1.27 Labs (5/18):  K 5.2, creatinine 1.47 Labs (7/18): digoxin 0.3 Labs (9/18): LDL 82, HDL 49, K 4.5, creatinine 1.34 Labs (11/18/2017): K 4.2 Creatinine 1.45 Hgb 64.    PMH: 1. Type II diabetes with peripheral neuropathy.  2. CKD 3. Hyperlipidemia 4. HTN: ACEI cough.  5. Nonischemic cardiomyopathy: This was diagnosed prior to 2008.  In 2008, patient had LHC with only mild nonobstructive coronary disease.  Echo in 1/14 showed EF 25-30% with normal RV.  Patient has not tolerated ACEI due to hyperkalemia and AKI. ICD was discussed (Dr Lovena Le) and decided against given NYHA class I symptoms.  Echo (7/15) with EF 25-30%, septal-lateral dyssynchrony, mild LV dilation. Echo (6/16) with EF 25-30%, mild LVH, septal-lateral dyssynchrony, normal RV size and systolic function. CPX (12/16) with peak VO2 14.5 (69% predicted), VE/VCO2 34, RER 1.19 => mild functional impairment from heart failure, restrictive lung function likely due to body habitus.  - LHC/RHC (6/17): 30% mid LAD stenosis; mean RA 5, PA 33/10, PCWP mean 16, CI 1.95.  - Medtronic CRT-D placed 7/17.  - Echo (1/18): EF 40-45%, mild LVH.  6. Chronic LBBB 7. Obesity 8. Atrial flutter and atrial fibrillation: S/p atrial flutter ablation in 2/16 ([facility]red).  9. Memory difficulty: Possible normal pressure hydrocephalus. 10. OSA: Uses CPAP.   SH: Lives in [place], married, prior smoker, no ETOH.  1 child.   FH: Grandmother with CHF.  Mother with CVA.   ROS: All systems reviewed and negative except as per HPI.  Current Outpatient Medications  Medication Sig Dispense Refill  . Ascorbic Acid (VITAMIN C) 1000 MG tablet Take 1 tablet by mouth 2 (two) times daily.     . carvedilol (COREG) 25 MG tablet Take 25 mg by mouth 2 (two) times daily with a meal.    . digoxin (LANOXIN) 0.125 MG tablet TAKE ONE TABLET BY MOUTH ONCE DAILY 90 tablet 3  . ENTRESTO 97-103 MG TAKE ONE TABLET BY MOUTH TWICE DAILY 180 tablet 3  . fenofibrate 54 MG tablet TAKE 1 TABLET BY MOUTH  ONCE DAILY 30 tablet 3  . HYDROcodone-acetaminophen (NORCO/VICODIN) 5-325 MG tablet Take 1 tablet by mouth every 4 (four) hours as needed for moderate pain. 30 tablet 0  . insulin NPH-regular Human (NOVOLIN 70/30) (70-30) 100 UNIT/ML injection 110 units with breakfast, and 80 units with supper, and syringes 2/day 70 mL 11  . loratadine (CLARITIN) 10 MG tablet Take 1 tablet by mouth 2 (two) times daily.     . Multiple Vitamin (MULTIVITAMIN) tablet Take 1 tablet by mouth daily.      . simvastatin (ZOCOR) 40 MG tablet Take 40 mg by mouth at bedtime.      . tamsulosin (FLOMAX) 0.4 MG CAPS capsule Take 0.4 mg by mouth daily.     Marland Kitchen torsemide (DEMADEX) 10 MG tablet Take 1 tablet (10 mg total) by mouth every other day. 15 tablet 3  . XARELTO 20 MG TABS tablet TAKE ONE TABLET BY MOUTH ONCE DAILY WITH  SUPPER 90 tablet 3   No current facility-administered medications for this encounter.     BP 112/60   Pulse 76   Wt 260 lb 9.6 oz (118.2 kg)   SpO2 95%   BMI 36.35 kg/m  General:  Well appearing. No resp difficulty. Arrived in wheel chair.  HEENT: normal Neck: supple. JVP 6-7. Carotids 2+ bilat; no bruits. No lymphadenopathy or thryomegaly appreciated. Cor: PMI nondisplaced. Regular rate & rhythm. No rubs, gallops or murmurs. Lungs: clear Abdomen: soft, nontender, nondistended. No hepatosplenomegaly. No bruits or masses. Good bowel sounds. Extremities: no cyanosis, clubbing, rash, edema Neuro: alert & orientedx3, cranial nerves grossly intact. moves all 4 extremities w/o difficulty. Affect pleasant.    Assessment/Plan: 1. Cardiomyopathy: Nonischemic.  Low EF x years. Echo 6/16 showed stable EF 25-30% with septal-lateral dyssynchrony.  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  Interestingly, it also appears that he has a long-standing LBBB.  A chronic LBBB itself can potentially cause a cardiomyopathy and may be the source of his low  EF.    Coronary angiography in 6/17 with nonobstructive disease. Has s/p Medtronic CRT-D device placement.  ECHO  1/18 with increase in EF to 40-45%.  - On goal dose coreg and entresto.  - Volume status stable. Continue torsemide 10 mg every other day.    - Continue digoxin, check dig level today.  - Off spiro with elevated potassium.  2. Hyperlipidemia: Continue statin.   9/18 lipids ok.  3. CKD: Creatinine baseline 1.2-1.5. BMET today.  4. Atrial flutter: s/p ablation,  5. Atrial fibrillation: Has had rare small burst A fib.  Continue xarelto.   6. Memory difficulty: Working up for normal pressure hydrocephalus.  7. OSA: Continue to use CPAP.   Check BMET, dig level, and ECHO. Discussed device interrogation.    Follow up 3-4 months with Dr Aundra Dubin.      Norinne Jeane NP-C  02/01/2018

## 2018-02-01 NOTE — Patient Instructions (Signed)
Labs today  Your physician has requested that you have an echocardiogram. Echocardiography is a painless test that uses sound waves to create images of your heart. It provides your doctor with information about the size and shape of your heart and how well your heart's chambers and valves are working. This procedure takes approximately one hour. There are no restrictions for this procedure.  Your physician recommends that you schedule a follow-up appointment in: 3 months with Dr Aundra Dubin

## 2018-02-03 DIAGNOSIS — Z6835 Body mass index (BMI) 35.0-35.9, adult: Secondary | ICD-10-CM | POA: Diagnosis not present

## 2018-02-03 DIAGNOSIS — R05 Cough: Secondary | ICD-10-CM | POA: Diagnosis not present

## 2018-02-09 ENCOUNTER — Ambulatory Visit (INDEPENDENT_AMBULATORY_CARE_PROVIDER_SITE_OTHER): Payer: Medicare Other | Admitting: *Deleted

## 2018-02-09 ENCOUNTER — Other Ambulatory Visit: Payer: Self-pay

## 2018-02-09 ENCOUNTER — Ambulatory Visit (HOSPITAL_COMMUNITY): Payer: Medicare Other | Attending: Cardiology

## 2018-02-09 DIAGNOSIS — I5030 Unspecified diastolic (congestive) heart failure: Secondary | ICD-10-CM | POA: Insufficient documentation

## 2018-02-09 DIAGNOSIS — Z95810 Presence of automatic (implantable) cardiac defibrillator: Secondary | ICD-10-CM

## 2018-02-09 DIAGNOSIS — I428 Other cardiomyopathies: Secondary | ICD-10-CM

## 2018-02-09 DIAGNOSIS — I5022 Chronic systolic (congestive) heart failure: Secondary | ICD-10-CM

## 2018-02-09 NOTE — Progress Notes (Signed)
Remote ICD transmission.   

## 2018-02-10 ENCOUNTER — Encounter: Payer: Self-pay | Admitting: Cardiology

## 2018-02-11 NOTE — Progress Notes (Signed)
EPIC Encounter for ICM Monitoring  Patient Name: Justin Hill is a 77 y.o. male Date: 02/11/2018 Primary Care Physican: Cyndy Freeze, MD Primary Cardiologist:[facility] Electrophysiologist: Allred Dry Weight:260 lbs (at 02/01/18 office visit)  Bi-V Pacing: 97.2%       Attempted call to wife and unable to reach.  Left detailed message regarding transmission.  Transmission reviewed.    Thoracic impedance normal.  Prescribed dosage: Torsemide 10 mg 1 tablet every other day.   Labs: 11/18/2017 Creatinine 1.45, BUN 12, Potassium 4.2, Sodium 139, EGFR 46-53 05/29/2017 Creatinine 1.32, BUN 13, Potassium 4.7, Sodium 134, EGFR 51-60 03/24/2017 Creatinine 1.47, BUN 25, Potassium 5.2, Sodium 133, EGFR 46-54 01/09/2017 Creatinine 1.42, BUN 15, Potassium 4.6, Sodium 136, EGFR 47-55 12/22/2016 Creatinine 1.56, BUN 22, Potassium 5.0, Sodium 138, EGFR 43-50 12/02/2016 Creatinine 1.20, BUN 14, Potassium 4.9, Sodium 138, EGFR >60 10/03/2016 Creatinine 1.27, BUN 16, Potassium 4.9, Sodium 135, EGFR 54->60  08/01/2016 Creatinine 1.21, BUN 16, Potassium 5.0, Sodium 138, EGFR 59-68  07/23/2016 Creatinine 1.52, BUN 19, Potassium 4.5, Sodium 136  05/29/2016 Creatinine 1.26, BUN 15, Potassium 4.6, Sodium 141  04/30/2016 Creatinine 1.17, BUN 9, Potassium 4.9, Sodium 138  12/28/2015 Creatinine 1.35, BUN 13, Potassium 4.9, Sodium 142  11/30/2016Creatinine 1.37, BUN 20, Potassium 4.3, Sodium 139   Recommendations: Left voice mail with ICM number and encouraged to call if experiencing any fluid symptoms.  Follow-up plan: ICM clinic phone appointment on 03/12/2018.    Copy of ICM check sent to Dr. Rayann Heman.   3 month ICM trend: 02/09/2018    1 Year ICM trend:       Rosalene Billings, RN 02/11/2018 9:16 AM

## 2018-02-12 ENCOUNTER — Telehealth (HOSPITAL_COMMUNITY): Payer: Self-pay | Admitting: *Deleted

## 2018-02-12 ENCOUNTER — Other Ambulatory Visit (HOSPITAL_COMMUNITY): Payer: Self-pay | Admitting: Cardiology

## 2018-02-12 NOTE — Progress Notes (Addendum)
Received call from wife. She said he had the flu during decreased impedance but the flu had resolved and he is feeling fine now. She asked for the results of the latest echo completed on 02/09/2018.  I advised would send a message to HF clinic regarding the results and one of [place] would call back with the results.  She stated that was fine.  No changes today.

## 2018-02-12 NOTE — Telephone Encounter (Signed)
Result Notes for ECHOCARDIOGRAM COMPLETE   Notes recorded by Darron Doom, RN on 02/12/2018 at 4:03 PM EDT Called and spoke with patient's wife, she is aware and no further questions. ------  Notes recorded by Darrick Grinder D, NP on 02/10/2018 at 9:45 AM EDT EF unchanged from 2018. EF 40-45%. Please call.

## 2018-02-18 ENCOUNTER — Telehealth: Payer: Self-pay | Admitting: Internal Medicine

## 2018-02-18 NOTE — Telephone Encounter (Signed)
New message    1. What dental office are you calling from? [facility], Dr Laurance Flatten  2. What is your office phone and fax number? Phone [phone], fax # [phone]  3. What type of procedure is the patient having performed? Dental extraction  4. What date is procedure scheduled or is the patient there now? TBD  5. What is your question (ex. Antibiotics prior to procedure, holding medication-we need to know how long dentist wants pt to hold med)? Xarelto

## 2018-02-22 NOTE — Telephone Encounter (Signed)
Clinical pharmacist to address

## 2018-02-23 NOTE — Telephone Encounter (Signed)
Pt takes Xarelto for afib with CHADS2VASc score of 6 (age x2, CHF, HTN, CAD, DM). CrCl is 74mL/min. Do not recommend holding Xarelto for single dental extraction. If pt is having a few teeth extracted, would be ok to hold for 1 day prior.

## 2018-02-24 NOTE — Telephone Encounter (Signed)
   Primary Cardiologist: Loralie Champagne, MD  Chart reviewed as part of pre-operative protocol coverage. Given past medical history and time since last visit, based on ACC/AHA guidelines, Justin Hill would be at acceptable risk for the planned dental extraction without further cardiovascular testing. Per clinical pharmacy office policy, "with [id number] score of 6 (age x2, CHF, HTN, CAD, DM). CrCl is 50mL/min. Do not recommend holding Xarelto for single dental extraction. If pt is having a few teeth extracted, would be ok to hold for 1 day prior."  I will route this recommendation to the requesting party via [facility] fax function and remove from pre-op pool.  Please call with questions.  Murray Hodgkins, NP 02/24/2018, 4:51 PM

## 2018-02-25 ENCOUNTER — Telehealth: Payer: Self-pay | Admitting: Internal Medicine

## 2018-02-25 NOTE — Telephone Encounter (Signed)
   Cardiac clearance was addressed on 02/24/2018 by Ignacia Bayley, NP.  Went into that encounter and re-faxed it to Dr. Lysbeth Galas.  Rosaria Ferries, PA-C 02/25/2018 5:15 PM Beeper [phone]

## 2018-02-25 NOTE — Telephone Encounter (Signed)
New Message:     April calling to see about the pre/op clearance for pt from [facility]/ Dr. Laurance Flatten. Office has not received clearance letter  [phone]

## 2018-02-26 ENCOUNTER — Other Ambulatory Visit: Payer: Self-pay | Admitting: Cardiology

## 2018-02-26 ENCOUNTER — Telehealth: Payer: Self-pay

## 2018-02-26 LAB — CUP PACEART REMOTE DEVICE CHECK
Battery Remaining Longevity: 50 mo
Brady Statistic AP VS Percent: 0.13 %
Brady Statistic AS VP Percent: 92.56 %
Brady Statistic AS VS Percent: 1.33 %
Brady Statistic RA Percent Paced: 6.02 %
Brady Statistic RV Percent Paced: 4.86 %
Date Time Interrogation Session: 20190319062823
HighPow Impedance: 81 Ohm
Implantable Lead Implant Date: 20170713
Implantable Pulse Generator Implant Date: 20170713
Lead Channel Impedance Value: 399 Ohm
Lead Channel Impedance Value: 418 Ohm
Lead Channel Impedance Value: 513 Ohm
Lead Channel Impedance Value: 532 Ohm
Lead Channel Impedance Value: 722 Ohm
Lead Channel Impedance Value: 836 Ohm
Lead Channel Impedance Value: 893 Ohm
Lead Channel Pacing Threshold Amplitude: 0.375 V
Lead Channel Pacing Threshold Amplitude: 3.25 V
Lead Channel Pacing Threshold Pulse Width: 0.4 ms
Lead Channel Pacing Threshold Pulse Width: 0.8 ms
Lead Channel Sensing Intrinsic Amplitude: 0.625 mV
Lead Channel Sensing Intrinsic Amplitude: 10.625 mV
Lead Channel Sensing Intrinsic Amplitude: 10.625 mV
Lead Channel Setting Pacing Amplitude: 2 V
Lead Channel Setting Pacing Pulse Width: 0.4 ms
Lead Channel Setting Pacing Pulse Width: 0.8 ms
MDC IDC LEAD IMPLANT DT: 20170713
MDC IDC LEAD IMPLANT DT: 20170713
MDC IDC LEAD LOCATION: 753858
MDC IDC LEAD LOCATION: 753859
MDC IDC LEAD LOCATION: 753860
MDC IDC MSMT BATTERY VOLTAGE: 2.97 V
MDC IDC MSMT LEADCHNL LV IMPEDANCE VALUE: 399 Ohm
MDC IDC MSMT LEADCHNL LV IMPEDANCE VALUE: 608 Ohm
MDC IDC MSMT LEADCHNL LV IMPEDANCE VALUE: 722 Ohm
MDC IDC MSMT LEADCHNL LV IMPEDANCE VALUE: 893 Ohm
MDC IDC MSMT LEADCHNL RA IMPEDANCE VALUE: 456 Ohm
MDC IDC MSMT LEADCHNL RA SENSING INTR AMPL: 0.625 mV
MDC IDC MSMT LEADCHNL RV IMPEDANCE VALUE: 418 Ohm
MDC IDC MSMT LEADCHNL RV PACING THRESHOLD AMPLITUDE: 0.5 V
MDC IDC MSMT LEADCHNL RV PACING THRESHOLD PULSEWIDTH: 0.4 ms
MDC IDC SET LEADCHNL LV PACING AMPLITUDE: 4.25 V
MDC IDC SET LEADCHNL RV PACING AMPLITUDE: 2.5 V
MDC IDC SET LEADCHNL RV SENSING SENSITIVITY: 0.3 mV
MDC IDC STAT BRADY AP VP PERCENT: 5.98 %

## 2018-02-26 NOTE — Telephone Encounter (Signed)
Returned call to wife as requested by voice mail message.   Patient has only 3 pills left for Digoxin and is asking for a 90 day refill.  The current script does not have any more refills.  Confirmed dosage in Epic and she requested to send to [facility] as the preferred pharmacy. Advised will send refill request to Dr Claris Gladden office.    She said the dental office is still waiting on the clearance regarding Xarelto guidelines to have dental work. Reviewed epic notes and advised the clearance has been faxed twice to the [facility] dental office.  She said the dental office still has not received it but would call the dentist office again.  She will have the dental office call back if they did not receive it today.

## 2018-02-26 NOTE — Telephone Encounter (Signed)
Faxed clearance via faxed machine to [facility]

## 2018-03-02 NOTE — Telephone Encounter (Signed)
Refill was sent 02/26/18

## 2018-03-12 ENCOUNTER — Telehealth: Payer: Self-pay | Admitting: Cardiology

## 2018-03-12 ENCOUNTER — Ambulatory Visit (INDEPENDENT_AMBULATORY_CARE_PROVIDER_SITE_OTHER): Payer: Medicare Other

## 2018-03-12 DIAGNOSIS — Z95810 Presence of automatic (implantable) cardiac defibrillator: Secondary | ICD-10-CM | POA: Diagnosis not present

## 2018-03-12 DIAGNOSIS — I5022 Chronic systolic (congestive) heart failure: Secondary | ICD-10-CM | POA: Diagnosis not present

## 2018-03-12 MED ORDER — TORSEMIDE 10 MG PO TABS: 10.0000 mg | ORAL_TABLET | ORAL | 3 refills | Status: DC

## 2018-03-12 NOTE — Telephone Encounter (Signed)
Confirmed remote transmission w/ pt wife.   

## 2018-03-12 NOTE — Progress Notes (Signed)
EPIC Encounter for ICM Monitoring  Patient Name: Justin Hill is a 77 y.o. male Date: 03/12/2018 Primary Care Physican: Cyndy Freeze, MD Primary Cardiologist:[facility] Electrophysiologist: Allred Dry Weight:260lbs  Bi-V Pacing: 96.6%       Spoke with wife.  Heart Failure questions reviewed, pt asymptomatic.   She asked for 90 day supply refill of Torsemide and sent script to [facility].    Thoracic impedance normal.  Prescribed dosage: Torsemide 10 mg 1 tablet every other day.   Labs: 02/01/2018 Creatinine 1.42, BUN 10, Potassium 4.5, Sodium 137, EGFR 47-55 11/18/2017 Creatinine 1.45, BUN 12, Potassium 4.2, Sodium 139, EGFR 46-53 05/29/2017 Creatinine 1.32, BUN 13, Potassium 4.7, Sodium 134, EGFR 51-60 03/24/2017 Creatinine 1.47, BUN 25, Potassium 5.2, Sodium 133, EGFR 46-54 01/09/2017 Creatinine 1.42, BUN 15, Potassium 4.6, Sodium 136, EGFR 47-55 12/22/2016 Creatinine 1.56, BUN 22, Potassium 5.0, Sodium 138, EGFR 43-50 12/02/2016 Creatinine 1.20, BUN 14, Potassium 4.9, Sodium 138, EGFR >60 10/03/2016 Creatinine 1.27, BUN 16, Potassium 4.9, Sodium 135, EGFR 54->60  08/01/2016 Creatinine 1.21, BUN 16, Potassium 5.0, Sodium 138, EGFR 59-68  07/23/2016 Creatinine 1.52, BUN 19, Potassium 4.5, Sodium 136  05/29/2016 Creatinine 1.26, BUN 15, Potassium 4.6, Sodium 141  04/30/2016 Creatinine 1.17, BUN 9, Potassium 4.9, Sodium 138  12/28/2015 Creatinine 1.35, BUN 13, Potassium 4.9, Sodium 142  11/30/2016Creatinine 1.37, BUN 20, Potassium 4.3, Sodium 139  Recommendations: No changes.   Encouraged to call for fluid symptoms.  Follow-up plan: ICM clinic phone appointment on 04/12/2018.    Copy of ICM check sent to Dr. Rayann Heman.   3 month ICM trend: 03/12/2018    1 Year ICM trend:       Rosalene Billings, RN 03/12/2018 3:46 PM

## 2018-03-19 ENCOUNTER — Other Ambulatory Visit: Payer: Self-pay | Admitting: Internal Medicine

## 2018-03-31 ENCOUNTER — Other Ambulatory Visit: Payer: Self-pay | Admitting: Cardiology

## 2018-04-05 ENCOUNTER — Ambulatory Visit: Payer: Medicare Other | Admitting: Endocrinology

## 2018-04-06 ENCOUNTER — Encounter: Payer: Self-pay | Admitting: Neurology

## 2018-04-08 ENCOUNTER — Encounter: Payer: Self-pay | Admitting: Neurology

## 2018-04-08 ENCOUNTER — Ambulatory Visit (INDEPENDENT_AMBULATORY_CARE_PROVIDER_SITE_OTHER): Payer: Medicare Other | Admitting: Neurology

## 2018-04-08 VITALS — BP 115/68 | HR 90 | Ht 71.0 in | Wt 262.0 lb

## 2018-04-08 DIAGNOSIS — G4733 Obstructive sleep apnea (adult) (pediatric): Secondary | ICD-10-CM | POA: Diagnosis not present

## 2018-04-08 DIAGNOSIS — R413 Other amnesia: Secondary | ICD-10-CM | POA: Diagnosis not present

## 2018-04-08 DIAGNOSIS — Z9989 Dependence on other enabling machines and devices: Secondary | ICD-10-CM | POA: Diagnosis not present

## 2018-04-08 NOTE — Progress Notes (Signed)
Subjective:    Patient ID: Justin Hill is a 77 y.o. male.  HPI     Interim history:   [name] is a 77 year old right-handed gentleman with an underlying complex medical history of poorly controlled type 2 diabetes, memory loss, congestive heart failure, cardiomyopathy, memory loss, PAF, atrial flutter, s/p defib placement in 7/17, hypertension, hypothyroidism, chronic kidney disease, allergic rhinitis, asthma, history of pulmonary hypertension, peripheral neuropathy, obesity, recent Dx of severe OSA, recent hospitalization in March 2018 for dehydration, acute on chronic kidney injury and obesity, who presents for follow-up consultation of his obstructive sleep apnea and memory loss. The patient is accompanied by his wife again today. I last saw him on 05/28/2017, at which time he was compliant with his CPAP, he felt that he was sleeping better, still had some nocturia. Memory wise he was fairly stable.   He was seen in the interim by Justin Givens, NP on 10/05/2017, at which time he was compliant with his CPAP.  He had an interim VP shunt placement in December 2018 for diagnosis of NPH. He was referred by PCP to Dr. Arnoldo Hill for this.   Today, 04/08/2018: I reviewed his CPAP compliance data from 03/08/2018 through 04/06/2018 which is a total of 30 days, during which time he used his CPAP every night with percent used days greater than 4 hours at 93%, indicating excellent compliance with an average usage of 7 hours and 28 minutes which shows extended use, residual AHI mildly suboptimal at 7.6 per hour, leak on the high side with the 95th percentile at 34.8 L/m on a pressure of 8 cm with EPR of 3. Residual sleep-disordered breathing appears to be primarily obstructive in nature. He reports doing okay. Memory stable per wife. Puts the CPAP on in the evening when watching TV, hence the extended use. Sleeps about 7 or 8 hours, per wife.    The patient's allergies, current medications, family  history, past medical history, past social history, past surgical history and problem list were reviewed and updated as appropriate.    Previously (copied from previous notes for reference):   I saw him on 03/25/2017, at which time he was re-referred for memory loss. He had not yet started his CPAP treatment for his severe obstructive sleep apnea. I suggested we monitor his memory scores. His MMSE was 22 out of 30 at the time. He was explained that he was at risk for vascular dementia. He was advised that memory loss can be seen in the context of untreated sleep apnea. In addition, memory loss contributor's include acute on chronic kidney injury, recent hospitalization, abnormal sugar values, electrolyte disturbance and I suggested that we hold off on any other medication trials. He had side effects on Namzaric.  I did consider referring him to neuropsychological evaluation after he establishes CPAP therapy.   He had a split-night sleep study on 02/19/2017 and then returned for a full night CPAP titration study on 03/06/2017. I went over his test results with him in detail today. His split-night sleep study from 02/19/2017 showed a sleep efficiency at baseline of 75.8%, sleep latency was 20 minutes, REM latency was 107.5 minutes. He had an overall AHI of 50.9 per hour, average oxygen saturation of 92%, nadir was 82%. He had no significant PLMS at baseline. He was fitted with a full face mask. CPAP was titrated from 5 cm to 8 cm. He did not achieve significant amount of sleep post CPAP initiation and an optimal pressure was therefore  not obtained. EKG showed occasional PVCs. He return for a full night CPAP titration study on 03/06/2017. Sleep efficiency was very low at 21.6%, REM sleep was absent. Slow-wave sleep was absent as well, he had an increased percentage of stage I and stage II sleep. He was tried on a nasal mask and then switched to a full facemask. CPAP was titrated from 5 cm to 10 cm. He was having  difficulty maintaining sleep, was tried on BiPAP of 10/5 cm for better tolerance but did not achieve any significant amount of sleep. I suggested we start him on him. CPAP therapy at home at a pressure of 7 cm.   I reviewed his CPAP compliance data from 04/28/2017 through 05/27/2017, which is a total of 30 days, during which time he used his machine every night with percent used days greater than 4 hours at 97%, indicating excellent compliance with an average usage of 9 hours and 23 minutes, residual AHI suboptimal at 8.7 per hour, leak on the high side with the 95th percentile at 23.6 L/m on a pressure of 7 cm with EPR of 3.   03/25/17: He reports memory loss for the past month or so, however, his wife reports a more longer issue with memory, worse over the past 6 months. He has had some associated irritability, no anger outbursts, but he is verbally loudly to her at times. She does become tearful intermittently. They have been married for over 82 years and had dating several years before that, she reports that she knows him very well and some of his reports are heard full. He has had short-term memory issues, some confusion, still drives but mostly local and familiar roads and mostly she is with him at the time. He does not have a family history of Alzheimer's disease. I reviewed your office note from 02/11/2017. Of note, he is not yet on CPAP therapy, received the machine yesterday.   Blood work from 02/11/2017 showed blood sugar level of 234, BUN of 40, creatinine of 1.62.  BMP on 03/24/17: showed glu 383, creatinine at 1.47, which is around his baseline and Na and Cl borderline low.    02/12/2017: He reports snoring and excessive daytime somnolence. I reviewed your office note from 02/03/2017 which you kindly included.   He has no AM HAs, has nocturia once per night. He used to snore when he was heavier per wife. He has started being more sleepy during the day in the past week or 2. He has been on  Imuran for unclear reasons. Wife reports that he was on methotrexate but had side effects and was switched to Imuran. This is for a rash as I understand. He was recently also started on Namzaric but had significant side effects after 2 or 3 days including swelling of his legs and his wife stopped the medication after which the swelling improved.   He had a recent hospitalization on 02/04/17 to 02/05/2017 secondary to altered mental status. He was found to be dehydrated. CT head without contrast showed prominent ventricles in the context of atrophy. Some of his medications were discontinued. A1c was above 8, he had elevated glucose levels. He is creatinine level was elevated. TSH was normal, B12 and the lower end of normal. Lipid panel was unremarkable.   His Epworth sleepiness score is 14 out of 24, fatigue score is 63 out of 63. He quit smoking over 40 years ago, does not drink alcohol and no daily caffeine. He is retired. He  lives with his wife. They have one child.   His Past Medical History Is Significant For: Past Medical History:  Diagnosis Date  . Altered mental status   . Cardiomyopathy   . CHF (congestive heart failure) ([place])   . Chronic kidney disease (CKD), stage III (moderate) (H[facility])   . Communicating hydrocephalus   . Diabetes mellitus    type 2  . Diverticulosis   . History of kidney stones   . Hypercholesterolemia   . Hyperglycemia   . Hypertension   . Paroxysmal atrial fibrillation ([facility])   . Personal history of kidney stones   . Pneumonia   . Sleep apnea    uses cpap    His Past Surgical History Is Significant For: Past Surgical History:  Procedure Laterality Date  . ATRIAL FLUTTER ABLATION N/A 01/19/2015   Procedure: ATRIAL FLUTTER ABLATION;  Surgeon: [name], MD;  Location: [facility] CATH LAB;  Service: Cardiovascular;  Laterality: N/A;  . CARDIAC CATHETERIZATION N/A 05/02/2016   Procedure: Right/Left Heart Cath and Coronary Angiography;  Surgeon: Larey Dresser, MD;   Location: [facility] CV LAB;  Service: Cardiovascular;  Laterality: N/A;  . COLONOSCOPY N/A 12/29/2013   Procedure: COLONOSCOPY;  Surgeon: Lafayette Dragon, MD;  Location: [facility] ENDOSCOPY;  Service: Endoscopy;  Laterality: N/A;  . EP IMPLANTABLE DEVICE N/A 06/05/2016   MDT Hillery Aldo XT CRTD implanted by Dr Rayann Heman   . EYE SURGERY     cataract surgery (not sure which eye)  . [place]   left. Muscle reattachment  . TOE SURGERY Right 12/2012   2nd toe  . TONSILLECTOMY    . VENTRICULOPERITONEAL SHUNT Right 11/20/2017   Procedure: SHUNT PLACEMENT RIGHT;  Surgeon: Newman Pies, MD;  Location: [facility];  Service: Neurosurgery;  Laterality: Right;  SHUNT PLACEMENT RIGHT    His Family History Is Significant For: Family History  Problem Relation Age of Onset  . Heart failure Maternal Grandmother   . Diabetes type II Sister 66  . Cancer Sister   . Hyperlipidemia Other        Parent  . Stroke Other        Parent  . Hypertension Other        Parent  . Diabetes Other        Parent  . Colon cancer Neg Hx   . Heart attack Neg Hx     His Social History Is Significant For: Social History   Socioeconomic History  . Marital status: Married    Spouse name: Rosemarie Beath  . Number of children: 1  . Years of education: 70  . Highest education level: Not on file  Occupational History  . Occupation: retired  Scientific laboratory technician  . Financial resource strain: Not on file  . Food insecurity:    Worry: Not on file    Inability: Not on file  . Transportation needs:    Medical: Not on file    Non-medical: Not on file  Tobacco Use  . Smoking status: Former Smoker    Types: Cigarettes  . Smokeless tobacco: Never Used  Substance and Sexual Activity  . Alcohol use: No  . Drug use: No  . Sexual activity: Not on file  Lifestyle  . Physical activity:    Days per week: Not on file    Minutes per session: Not on file  . Stress: Not on file  Relationships  . Social connections:    Talks on  phone: Not on file  Gets together: Not on file    Attends religious service: Not on file    Active member of club or organization: Not on file    Attends meetings of clubs or organizations: Not on file    Relationship status: Not on file  Other Topics Concern  . Not on file  Social History Narrative   Regular exercise-no   Rare caffeine use     His Allergies Are:  Allergies  Allergen Reactions  . Penicillins Anaphylaxis and Other (See Comments)    SYNCOPE PATIENT HAS HAD A PCN REACTION WITH IMMEDIATE RASH, FACIAL/TONGUE/THROAT SWELLING, SOB, OR LIGHTHEADEDNESS WITH HYPOTENSION:  #  #  #  YES  #  #  #   Has patient had a PCN reaction causing severe rash involving mucus membranes or skin necrosis: no PATIENT HAS HAD A PCN REACTION THAT REQUIRED HOSPITALIZATION:  #  #  #  YES  #  #  #  Has patient had a PCN reaction occurring within the last 10 years: no    . Aspirin Other (See Comments)    Burps, burning, stomach pains, etc  . Lisinopril Cough    Severe coughing  :   His Current Medications Are:  Outpatient Encounter Medications as of 04/08/2018  Medication Sig  . Ascorbic Acid (VITAMIN C) 1000 MG tablet Take 1 tablet by mouth 2 (two) times daily.   . carvedilol (COREG) 25 MG tablet Take 25 mg by mouth 2 (two) times daily with a meal.  . digoxin (LANOXIN) 0.125 MG tablet TAKE 1 TABLET BY MOUTH ONCE DAILY  . ENTRESTO 97-103 MG TAKE ONE TABLET BY MOUTH TWICE DAILY  . fenofibrate 54 MG tablet TAKE 1 TABLET BY MOUTH ONCE DAILY  . insulin NPH-regular Human (NOVOLIN 70/30) (70-30) 100 UNIT/ML injection 110 units with breakfast, and 80 units with supper, and syringes 2/day  . loratadine (CLARITIN) 10 MG tablet Take 1 tablet by mouth 2 (two) times daily.   . Multiple Vitamin (MULTIVITAMIN) tablet Take 1 tablet by mouth daily.    . simvastatin (ZOCOR) 40 MG tablet Take 40 mg by mouth at bedtime.    . tamsulosin (FLOMAX) 0.4 MG CAPS capsule Take 0.4 mg by mouth daily.   Marland Kitchen torsemide  (DEMADEX) 10 MG tablet Take 1 tablet (10 mg total) by mouth every other day.  Alveda Reasons 20 MG TABS tablet TAKE 1 TABLET BY MOUTH ONCE DAILY WITH  SUPPER  . [DISCONTINUED] HYDROcodone-acetaminophen (NORCO/VICODIN) 5-325 MG tablet Take 1 tablet by mouth every 4 (four) hours as needed for moderate pain.   No facility-administered encounter medications on file as of 04/08/2018.   :  Review of Systems:  Out of a complete 14 point review of systems, all are reviewed and negative with the exception of these symptoms as listed below: Review of Systems  Neurological:       Pt presents today to discuss his cpap and memory. Pt feels that his cpap and memory are ok.    Objective:  Neurological Exam  Physical Exam Physical Examination:   Vitals:   04/08/18 1354  BP: 115/68  Pulse: 90    General Examination: The patient is a very pleasant 77 y.o. male in no acute distress. He appears well-developed and well-nourished and well groomed.   HEENT:Normocephalic, atraumatic, pupils are equal, round and reactive to light and accommodation. Extraocular tracking is fairly good today. He has prescription eyeglasses. Hearing is grossly intact. Speech is scant but not dysarthric or hypophonic. Airway examination  reveals moderate mouth dryness, otherwise stable findings.  Chest:Clear to auscultation without wheezing, rhonchi or crackles noted.  Heart:S1+S2+0, regular and normal without murmurs, rubs or gallops noted.   Abdomen:Soft, non-tender and non-distended with normal bowel sounds appreciated on auscultation.  Extremities:There is no pitting edema in the distal lower extremities bilaterally.  Skin: Warm and considerably dry and flaky. Mild bruising (is on Xarelto).  Musculoskeletal: exam reveals no obvious joint deformities, tenderness or joint swelling or erythema.   Neurologically:  Mental status: The patient is awake, alert and oriented in all 4 spheres. Hisimmediate and remote  memory, attention, language skills and fund of knowledge are impaired. Mood appears to be normal, affect is blunted.   On 03/25/2017: MMSE: 22/30, CDT: 3/4, AFT: 15/min.  On 10/05/17: MMSE: 20/30.  On 04/08/2018: MMSE: 20/30, CDT: 4/4, AFT: 11/min.  Cranial nerves are under HEENT exam. Motor exam reveals normal bulk, global strength of 4+ out of 5, Romberg is not testable safely, reflexes are 1+ in the UEs, trace in the knees, absent in the ankles. Fine motor skills are mildly impaired globally, sensory exam is intact to light touch.  Gait, station and balance: [place] with mild difficulty and pushes himself up. He walks slowly and cautiously, tandem walk is not possible. Balance is mildly impaired.   Assessment and Plan:   In summary, Tetsuo Coppola McDonaldis a very pleasant 77 year old male with an underlying complex medical history of type 2 diabetes, memory loss, congestive heart failure, cardiomyopathy, PAF/atrial flutter, s/p defib placement in 7/17, hypertension, hypothyroidism, chronic kidney disease, allergic rhinitis, asthma, history of pulmonary hypertension, peripheral neuropathy, obesity, OSA, hospitalization in March 2018 for dehydration, acute on chronic kidney injury and recent VPS placement, who presents for follow-up consultation of his OSA and memory loss. He has established treatment with CPAP therapy and is compliant with treatment for which he is commended. He reported improvement in his sleep quality and in his memory. He has stable findings on memory scores. He has multiple vascular risk factors and is certainly at risk for vascular dementia. He had side effects with Namzaric, which was prescribed by in the past PCP. I suggested a six-month follow-up with Justin Hill, nurse practitioner and we will monitor his memory. He is advised to continue to use CPAP regularly. He is furthermore advised to stay well-hydrated, diabetes control has improved as well.Weight has been stable.  I answered all their questions today and the patient and his wife were in agreement. I spent 20 minutes in total face-to-face time with the patient, more than 50% of which was spent in counseling and coordination of care, reviewing test results, reviewing medication and discussing or reviewing the diagnosis of OSA and memory loss, its prognosis and treatment options. Pertinent laboratory and imaging test results that were available during this visit with the patient were reviewed by me and considered in my medical decision making (see chart for details).

## 2018-04-08 NOTE — Patient Instructions (Addendum)
Please continue using your CPAP regularly. While your insurance requires that you use CPAP at least 4 hours each night on 70% of the nights, I recommend, that you not skip any nights and use it throughout the night if you can. Getting used to CPAP and staying with the treatment long term does take time and patience and discipline. Untreated obstructive sleep apnea when it is moderate to severe can have an adverse impact on cardiovascular health and raise her risk for heart disease, arrhythmias, hypertension, congestive heart failure, stroke and diabetes. Untreated obstructive sleep apnea causes sleep disruption, nonrestorative sleep, and sleep deprivation. This can have an impact on your day to day functioning and cause daytime sleepiness and impairment of cognitive function, memory loss, mood disturbance, and problems focussing. Using CPAP regularly can improve these symptoms.  Your memory scores are stable, you can be monitored for this.   Please follow-up in 6 months, you can see Jinny Blossom again next time.

## 2018-04-12 ENCOUNTER — Ambulatory Visit (INDEPENDENT_AMBULATORY_CARE_PROVIDER_SITE_OTHER): Payer: Medicare Other

## 2018-04-12 DIAGNOSIS — I5022 Chronic systolic (congestive) heart failure: Secondary | ICD-10-CM | POA: Diagnosis not present

## 2018-04-12 DIAGNOSIS — Z95810 Presence of automatic (implantable) cardiac defibrillator: Secondary | ICD-10-CM

## 2018-04-12 NOTE — Progress Notes (Signed)
EPIC Encounter for ICM Monitoring  Patient Name: Justin Hill is a 77 y.o. male Date: 04/12/2018 Primary Care Physican: Serita Grammes, MD Primary Cardiologist:[place] Electrophysiologist: Allred Dry Weight:260lbs Bi-V Pacing: 96.9%       Heart Failure questions reviewed, pt asymptomatic.   Thoracic impedance normal.  Prescribed dosage: Torsemide 10 mg 1 tablet every other day.   Labs: 02/01/2018 Creatinine 1.42, BUN 10, Potassium 4.5, Sodium 137, EGFR 47-55 11/18/2017 Creatinine 1.45, BUN 12, Potassium 4.2, Sodium 139, EGFR 46-53 05/29/2017 Creatinine 1.32, BUN 13, Potassium 4.7, Sodium 134, EGFR 51-60 03/24/2017 Creatinine 1.47, BUN 25, Potassium 5.2, Sodium 133, EGFR 46-54 01/09/2017 Creatinine 1.42, BUN 15, Potassium 4.6, Sodium 136, EGFR 47-55 12/22/2016 Creatinine 1.56, BUN 22, Potassium 5.0, Sodium 138, EGFR 43-50 12/02/2016 Creatinine 1.20, BUN 14, Potassium 4.9, Sodium 138, EGFR >60 10/03/2016 Creatinine 1.27, BUN 16, Potassium 4.9, Sodium 135, EGFR 54->60  08/01/2016 Creatinine 1.21, BUN 16, Potassium 5.0, Sodium 138, EGFR 59-68  07/23/2016 Creatinine 1.52, BUN 19, Potassium 4.5, Sodium 136  05/29/2016 Creatinine 1.26, BUN 15, Potassium 4.6, Sodium 141  04/30/2016 Creatinine 1.17, BUN 9, Potassium 4.9, Sodium 138  12/28/2015 Creatinine 1.35, BUN 13, Potassium 4.9, Sodium 142  11/30/2016Creatinine 1.37, BUN 20, Potassium 4.3, Sodium 139  Recommendations: No changes.    Encouraged to call for fluid symptoms.  Follow-up plan: ICM clinic phone appointment on 05/13/2018.  Office appointment scheduled 05/04/2018 with Dr. Aundra Dubin.  Copy of ICM check sent to Dr. Rayann Heman.   3 month ICM trend: 04/12/2018    1 Year ICM trend:       Rosalene Billings, RN 04/12/2018 4:46 PM

## 2018-04-14 DIAGNOSIS — Z89421 Acquired absence of other right toe(s): Secondary | ICD-10-CM | POA: Diagnosis not present

## 2018-04-14 DIAGNOSIS — E1142 Type 2 diabetes mellitus with diabetic polyneuropathy: Secondary | ICD-10-CM | POA: Diagnosis not present

## 2018-04-14 DIAGNOSIS — B351 Tinea unguium: Secondary | ICD-10-CM | POA: Diagnosis not present

## 2018-04-14 DIAGNOSIS — M2042 Other hammer toe(s) (acquired), left foot: Secondary | ICD-10-CM | POA: Diagnosis not present

## 2018-04-14 DIAGNOSIS — L84 Corns and callosities: Secondary | ICD-10-CM | POA: Diagnosis not present

## 2018-04-14 DIAGNOSIS — M2041 Other hammer toe(s) (acquired), right foot: Secondary | ICD-10-CM | POA: Diagnosis not present

## 2018-04-28 DIAGNOSIS — G919 Hydrocephalus, unspecified: Secondary | ICD-10-CM | POA: Diagnosis not present

## 2018-04-28 DIAGNOSIS — I5022 Chronic systolic (congestive) heart failure: Secondary | ICD-10-CM | POA: Diagnosis not present

## 2018-04-28 DIAGNOSIS — N183 Chronic kidney disease, stage 3 (moderate): Secondary | ICD-10-CM | POA: Diagnosis not present

## 2018-04-28 DIAGNOSIS — E1122 Type 2 diabetes mellitus with diabetic chronic kidney disease: Secondary | ICD-10-CM | POA: Diagnosis not present

## 2018-05-04 ENCOUNTER — Encounter (HOSPITAL_COMMUNITY): Payer: Self-pay | Admitting: Cardiology

## 2018-05-04 ENCOUNTER — Other Ambulatory Visit: Payer: Self-pay

## 2018-05-04 ENCOUNTER — Ambulatory Visit (HOSPITAL_COMMUNITY)
Admission: RE | Admit: 2018-05-04 | Discharge: 2018-05-04 | Disposition: A | Discharge status: Home / Self Care | Payer: Medicare Other | Source: Ambulatory Visit | Attending: Cardiology | Admitting: Cardiology

## 2018-05-04 VITALS — BP 129/73 | HR 79 | Wt 264.8 lb

## 2018-05-04 DIAGNOSIS — I480 Paroxysmal atrial fibrillation: Secondary | ICD-10-CM | POA: Diagnosis not present

## 2018-05-04 DIAGNOSIS — Z6836 Body mass index (BMI) 36.0-36.9, adult: Secondary | ICD-10-CM | POA: Insufficient documentation

## 2018-05-04 DIAGNOSIS — N189 Chronic kidney disease, unspecified: Secondary | ICD-10-CM | POA: Diagnosis not present

## 2018-05-04 DIAGNOSIS — Z79899 Other long term (current) drug therapy: Secondary | ICD-10-CM | POA: Diagnosis not present

## 2018-05-04 DIAGNOSIS — Z7901 Long term (current) use of anticoagulants: Secondary | ICD-10-CM | POA: Insufficient documentation

## 2018-05-04 DIAGNOSIS — I4891 Unspecified atrial fibrillation: Secondary | ICD-10-CM | POA: Diagnosis not present

## 2018-05-04 DIAGNOSIS — Z794 Long term (current) use of insulin: Secondary | ICD-10-CM | POA: Diagnosis not present

## 2018-05-04 DIAGNOSIS — I447 Left bundle-branch block, unspecified: Secondary | ICD-10-CM | POA: Diagnosis not present

## 2018-05-04 DIAGNOSIS — I4892 Unspecified atrial flutter: Secondary | ICD-10-CM | POA: Diagnosis not present

## 2018-05-04 DIAGNOSIS — E1122 Type 2 diabetes mellitus with diabetic chronic kidney disease: Secondary | ICD-10-CM | POA: Diagnosis not present

## 2018-05-04 DIAGNOSIS — E785 Hyperlipidemia, unspecified: Secondary | ICD-10-CM

## 2018-05-04 DIAGNOSIS — I509 Heart failure, unspecified: Secondary | ICD-10-CM | POA: Diagnosis not present

## 2018-05-04 DIAGNOSIS — Z87891 Personal history of nicotine dependence: Secondary | ICD-10-CM | POA: Diagnosis not present

## 2018-05-04 DIAGNOSIS — I5022 Chronic systolic (congestive) heart failure: Secondary | ICD-10-CM

## 2018-05-04 DIAGNOSIS — G4733 Obstructive sleep apnea (adult) (pediatric): Secondary | ICD-10-CM | POA: Diagnosis not present

## 2018-05-04 DIAGNOSIS — G912 (Idiopathic) normal pressure hydrocephalus: Secondary | ICD-10-CM | POA: Insufficient documentation

## 2018-05-04 DIAGNOSIS — I428 Other cardiomyopathies: Secondary | ICD-10-CM | POA: Insufficient documentation

## 2018-05-04 DIAGNOSIS — I130 Hypertensive heart and chronic kidney disease with heart failure and stage 1 through stage 4 chronic kidney disease, or unspecified chronic kidney disease: Secondary | ICD-10-CM | POA: Insufficient documentation

## 2018-05-04 DIAGNOSIS — E669 Obesity, unspecified: Secondary | ICD-10-CM | POA: Insufficient documentation

## 2018-05-04 DIAGNOSIS — Z982 Presence of cerebrospinal fluid drainage device: Secondary | ICD-10-CM | POA: Diagnosis not present

## 2018-05-04 DIAGNOSIS — I519 Heart disease, unspecified: Secondary | ICD-10-CM | POA: Diagnosis not present

## 2018-05-04 LAB — BASIC METABOLIC PANEL
ANION GAP: 9 (ref 5–15)
BUN: 10 mg/dL (ref 6–20)
CHLORIDE: 102 mmol/L (ref 101–111)
CO2: 27 mmol/L (ref 22–32)
CREATININE: 1.53 mg/dL — AB (ref 0.61–1.24)
Calcium: 9.7 mg/dL (ref 8.9–10.3)
GFR calc non Af Amer: 43 mL/min — ABNORMAL LOW (ref >60)
GFR, EST AFRICAN AMERICAN: 50 mL/min — AB (ref >60)
GLUCOSE: 298 mg/dL — AB (ref 65–99)
Potassium: 5.2 mmol/L — ABNORMAL HIGH (ref 3.5–5.1)
Sodium: 138 mmol/L (ref 135–145)

## 2018-05-04 LAB — CBC
HCT: 41.8 % (ref 39.0–52.0)
HEMOGLOBIN: 13.8 g/dL (ref 13.0–17.0)
MCH: 30 pg (ref 26.0–34.0)
MCHC: 33 g/dL (ref 30.0–36.0)
MCV: 90.9 fL (ref 78.0–100.0)
Platelets: 131 10*3/uL — ABNORMAL LOW (ref 150–400)
RBC: 4.6 MIL/uL (ref 4.22–5.81)
RDW: 13.6 % (ref 11.5–15.5)
WBC: 5.9 10*3/uL (ref 4.0–10.5)

## 2018-05-04 LAB — LIPID PANEL
CHOL/HDL RATIO: 4.2 ratio
Cholesterol: 173 mg/dL (ref 0–200)
HDL: 41 mg/dL (ref >40)
LDL Cholesterol: 76 mg/dL (ref 0–99)
Triglycerides: 278 mg/dL — ABNORMAL HIGH (ref <150)
VLDL: 56 mg/dL — AB (ref 0–40)

## 2018-05-04 LAB — DIGOXIN LEVEL: DIGOXIN LVL: 0.7 ng/mL — AB (ref 0.8–2.0)

## 2018-05-04 MED ORDER — SPIRONOLACTONE 25 MG PO TABS: 12.5000 mg | ORAL_TABLET | Freq: Every day | ORAL | 2 refills | Status: DC

## 2018-05-04 NOTE — Patient Instructions (Signed)
Start Spironolactone 12.5 mg (1/2 tab) daily  Labs drawn today (if we do not call you, then your lab work was stable)   Your physician recommends that you return for lab work in: 10 days Rx given  Your physician recommends that you schedule a follow-up appointment in: 4 months with Dr. Aundra Dubin  Please Call an Schedule Appointment (call in August).

## 2018-05-05 NOTE — Progress Notes (Signed)
Patient ID: Justin Hill, male   DOB: [birth year], 77 y.o.   MRN: [id number] PCP: Dr. Camillia Herter Cardiology: Dr. Aundra Dubin  77 y.o. with history CKD, HTN, diabetes, chronic LBBB, and nonischemic cardiomyopathy presents for followup of CHF.  His cardiomyopathy has been known for years.  He had a LHC in 2008 showing mild nonobstructive CAD.     Justin Hill was admitted in 2/16 with atrial flutter degenerating into atrial fibrillation.  He was initially cardioverted, then atrial flutter recurred.  He had atrial flutter ablation by Dr Rayann Heman in 2/16.  He thinks that he has been in NSR since that time, no further palpitations. He is on Xarelto now with no melena or BRBPR.    He is s/p placement of Medtronic CRT-D device.  Echo in 1/18 showed improvement in EF to 40-45%.  Echo in 3/19 showed EF 40-45%.   He developed normal pressure hydrocephalus and had VP shunt placed.  This has helped with his walking and some with his memory.   He has been fairly stable. Dyspnea walking long distances, ok walking around the house.  He has been sleeping in a recliner for a long time.  He uses CPAP nightly. No chest pain.     Medtronic device interrogation: Stable thoracic impedance, fluid index < threshold.    Labs (5/14): K 4, creatinine 1.4, LFTs normal, LDL 46, HDL 37 Labs (1/15): K 4.4, creatinine 1.46, LDL 74, HDL 35 Labs (4/15); LDL 96, HDL 44, K 5.2, creatinine 1.4 Labs (1/16): K 4.9, creatinine 1.36 Labs (2/16): K 4.5, creatinine 1.56, BNP 412 Labs (9/16): K 4.8, creatinine 1.33, hgb 14.7 Labs (11/16): K 4.3, creatinine 1.37, BNP 52 Labs (1/17): K 4.5 => 5, creatinine 1.35 => 1.17, HCT 47.4 Labs (2/17): K 4.9, creatinine 1.35 Labs (5/17): LDL 48, LFTs normal Labs (9/17): K 5, creatinine 1.21 Labs (11/17): HCT 41.4, digoxin 0.3, K 4.9, creatinine 1.27 Labs (5/18): K 5.2, creatinine 1.47 Labs (7/18): digoxin 0.3 Labs (9/18): LDL 82, HDL 49, K 4.5, creatinine 1.34 Labs (3/19): digoxin 0.4, K 4.5,  creatinine 1.42 Labs (6/19): K 5.2  PMH: 1. Type II diabetes with peripheral neuropathy.  2. CKD 3. Hyperlipidemia 4. HTN: ACEI cough.  5. Nonischemic cardiomyopathy: This was diagnosed prior to 2008.  In 2008, patient had LHC with only mild nonobstructive coronary disease.  Echo in 1/14 showed EF 25-30% with normal RV.  Patient has not tolerated ACEI due to hyperkalemia and AKI. ICD was discussed (Dr Lovena Le) and decided against given NYHA class I symptoms.  Echo (7/15) with EF 25-30%, septal-lateral dyssynchrony, mild LV dilation. Echo (6/16) with EF 25-30%, mild LVH, septal-lateral dyssynchrony, normal RV size and systolic function. CPX (12/16) with peak VO2 14.5 (69% predicted), VE/VCO2 34, RER 1.19 => mild functional impairment from heart failure, restrictive lung function likely due to body habitus.  - LHC/RHC (6/17): 30% mid LAD stenosis; mean RA 5, PA 33/10, PCWP mean 16, CI 1.95.  - Medtronic CRT-D placed 7/17.  - Echo (1/18): EF 40-45%, mild LVH.  - Echo (3/19): EF 40-45%, moderate LVH, normal RV size/function.  6. Chronic LBBB 7. Obesity 8. Atrial flutter and atrial fibrillation: S/p atrial flutter ablation in 2/16 (Allred).  9. Normal pressure hydrocephalus: s/p VP shunt.  10. OSA: Uses CPAP.   SH: Lives in [place], married, prior smoker, no ETOH.  1 child.   FH: Grandmother with CHF.  Mother with CVA.   ROS: All systems reviewed and negative except as per HPI.  Current Outpatient Medications  Medication Sig Dispense Refill  . Ascorbic Acid (VITAMIN C) 1000 MG tablet Take 1 tablet by mouth 2 (two) times daily.     . carvedilol (COREG) 25 MG tablet Take 25 mg by mouth 2 (two) times daily with a meal.    . digoxin (LANOXIN) 0.125 MG tablet TAKE 1 TABLET BY MOUTH ONCE DAILY 90 tablet 3  . ENTRESTO 97-103 MG TAKE ONE TABLET BY MOUTH TWICE DAILY 180 tablet 3  . fenofibrate 54 MG tablet TAKE 1 TABLET BY MOUTH ONCE DAILY 30 tablet 3  . insulin NPH-regular Human (NOVOLIN 70/30)  (70-30) 100 UNIT/ML injection 110 units with breakfast, and 80 units with supper, and syringes 2/day 70 mL 11  . loratadine (CLARITIN) 10 MG tablet Take 1 tablet by mouth 2 (two) times daily.     . Multiple Vitamin (MULTIVITAMIN) tablet Take 1 tablet by mouth daily.      Marland Kitchen omeprazole (PRILOSEC) 40 MG capsule Take 40 mg by mouth daily. OTC    . simvastatin (ZOCOR) 40 MG tablet Take 40 mg by mouth at bedtime.      . tamsulosin (FLOMAX) 0.4 MG CAPS capsule Take 0.4 mg by mouth daily.     Marland Kitchen torsemide (DEMADEX) 10 MG tablet Take 1 tablet (10 mg total) by mouth every other day. 45 tablet 3  . XARELTO 20 MG TABS tablet TAKE 1 TABLET BY MOUTH ONCE DAILY WITH  SUPPER 90 tablet 3  . spironolactone (ALDACTONE) 25 MG tablet Take 0.5 tablets (12.5 mg total) by mouth daily. 15 tablet 2   No current facility-administered medications for this encounter.     BP 129/73   Pulse 79   Wt 264 lb 12 oz (120.1 kg)   SpO2 97%   BMI 36.93 kg/m  General: NAD Neck: No JVD, no thyromegaly or thyroid nodule.  Lungs: Clear to auscultation bilaterally with normal respiratory effort. CV: Nondisplaced PMI.  Heart regular S1/S2, no S3/S4, no murmur.  1+ edema 1/3 up lower legs bilaterally.  No carotid bruit.  Normal pedal pulses.  Abdomen: Soft, nontender, no hepatosplenomegaly, no distention.  Skin: Intact without lesions or rashes.  Neurologic: Alert and oriented x 3.  Psych: Normal affect. Extremities: No clubbing or cyanosis.  HEENT: Normal.   Assessment/Plan: 1. Cardiomyopathy: Nonischemic.  Low EF x years. Echo 6/16 showed stable EF 25-30% with septal-lateral dyssynchrony.  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  Interestingly, it also appears that he has a long-standing LBBB.  A chronic LBBB itself can potentially cause a cardiomyopathy and may be the source of his low EF.   Coronary angiography in 6/17 with nonobstructive disease. Now s/p  Medtronic CRT-D device placement.  Most recent echo in 3/19 with EF up to 40-45%.  NYHA class II symptoms, not volume overload by exam or Optivol. - Continue Coreg, he is at goal dose. - Continue Entresto 97/103 bid.   - K was 5.2 today (BMET drawn), will not start spironolactone.  - Continue torsemide 10 mg every other day.    - Continue digoxin, check level today.  2. Hyperlipidemia: Patient is on simvastatin.  9/18 lipids ok.  3. CKD: BMET today.  4. Atrial flutter: s/p ablation, in NSR today.  5. Atrial fibrillation: Patient had afib noted as well as flutter.  Therefore, I think that he should stay on Xarelto long-term.  NSR today.  6. Normal pressure hydrocephalus: Has  VP shunt.   7. OSA: Continue to use CPAP.   Followup in 4 months.  Loralie Champagne 05/05/2018

## 2018-05-06 ENCOUNTER — Telehealth (HOSPITAL_COMMUNITY): Payer: Self-pay | Admitting: *Deleted

## 2018-05-06 NOTE — Telephone Encounter (Signed)
-----   Message from Larey Dresser, MD sent at 05/04/2018  4:44 PM EDT ----- K high, do not start spironolactone.

## 2018-05-07 ENCOUNTER — Telehealth: Payer: Self-pay | Admitting: Endocrinology

## 2018-05-07 DIAGNOSIS — Z6836 Body mass index (BMI) 36.0-36.9, adult: Secondary | ICD-10-CM | POA: Diagnosis not present

## 2018-05-07 DIAGNOSIS — G912 (Idiopathic) normal pressure hydrocephalus: Secondary | ICD-10-CM | POA: Diagnosis not present

## 2018-05-07 DIAGNOSIS — I10 Essential (primary) hypertension: Secondary | ICD-10-CM | POA: Diagnosis not present

## 2018-05-07 NOTE — Telephone Encounter (Signed)
Patient wife stated she was supposed to call and let the doctor know what was going on with patient b/s, he is on Humulin 70/30.  Wife stated patient b/s has been running in the the 200's in the morning and at night, it has been going on for about a week. Please advise on what to do.

## 2018-05-07 NOTE — Telephone Encounter (Signed)
Please advise 

## 2018-05-11 NOTE — Telephone Encounter (Signed)
Pts wife is aware which per DPR in chart is allowed by patient

## 2018-05-11 NOTE — Telephone Encounter (Signed)
Please increase to 130 units with breakfast, and 90 units with supper.  Please call or message [place] next week, to tell us how the blood sugar is doing.

## 2018-05-13 ENCOUNTER — Ambulatory Visit (INDEPENDENT_AMBULATORY_CARE_PROVIDER_SITE_OTHER): Payer: Medicare Other | Admitting: *Deleted

## 2018-05-13 DIAGNOSIS — Z95810 Presence of automatic (implantable) cardiac defibrillator: Secondary | ICD-10-CM

## 2018-05-13 DIAGNOSIS — I5022 Chronic systolic (congestive) heart failure: Secondary | ICD-10-CM

## 2018-05-13 DIAGNOSIS — I428 Other cardiomyopathies: Secondary | ICD-10-CM

## 2018-05-13 NOTE — Progress Notes (Signed)
Remote ICD transmission.   

## 2018-05-14 ENCOUNTER — Ambulatory Visit (INDEPENDENT_AMBULATORY_CARE_PROVIDER_SITE_OTHER): Payer: Medicare Other

## 2018-05-14 ENCOUNTER — Telehealth: Payer: Self-pay

## 2018-05-14 DIAGNOSIS — Z95810 Presence of automatic (implantable) cardiac defibrillator: Secondary | ICD-10-CM

## 2018-05-14 DIAGNOSIS — I5022 Chronic systolic (congestive) heart failure: Secondary | ICD-10-CM

## 2018-05-14 NOTE — Telephone Encounter (Signed)
Remote ICM transmission received.  Attempted call to patient and left detailed message, per DPR, regarding transmission and next ICM scheduled for 06/17/2018.  Advised to return call for any fluid symptoms or questions.

## 2018-05-14 NOTE — Progress Notes (Signed)
EPIC Encounter for ICM Monitoring  Patient Name: Justin Hill is a 77 y.o. male Date: 05/14/2018 Primary Care Physican: Serita Grammes, MD Primary Cardiologist:[facility] Electrophysiologist: Allred Dry Weight:Previous weight 260lbs Bi-V Pacing: 96.9%  Clinical Status (16-Apr-2018 to 13-May-2018) Treated VT/VF 0 episodes  AT/AF 5 episodes  Time in AT/AF <0.1 hr/day (0.2%)       Attempted call to patient and unable to reach.  Left detailed message, per DPR, regarding transmission.  Transmission reviewed.    Thoracic impedance normal.  Prescribed dosage: Torsemide 10 mg 1 tablet every other day.   Labs: 05/04/2018 Creatinine 1.53, BUN 10, Potassium 5.2, Sodium 138, EGFR 43-50 02/01/2018 Creatinine 1.42, BUN 10, Potassium 4.5, Sodium 137, EGFR 47-55 11/18/2017 Creatinine 1.45, BUN 12, Potassium 4.2, Sodium 139, EGFR 46-53 05/29/2017 Creatinine 1.32, BUN 13, Potassium 4.7, Sodium 134, EGFR 51-60 03/24/2017 Creatinine 1.47, BUN 25, Potassium 5.2, Sodium 133, EGFR 46-54 01/09/2017 Creatinine 1.42, BUN 15, Potassium 4.6, Sodium 136, EGFR 47-55 12/22/2016 Creatinine 1.56, BUN 22, Potassium 5.0, Sodium 138, EGFR 43-50 12/02/2016 Creatinine 1.20, BUN 14, Potassium 4.9, Sodium 138, EGFR >60  Recommendations: Left voice mail with ICM number and encouraged to call if experiencing any fluid symptoms..  Follow-up plan: ICM clinic phone appointment on 06/17/2018.    Copy of ICM check sent to Dr. Rayann Heman.   3 month ICM trend: 05/13/2018    1 Year ICM trend:       Rosalene Billings, RN 05/14/2018 10:56 AM

## 2018-05-17 DIAGNOSIS — E113299 Type 2 diabetes mellitus with mild nonproliferative diabetic retinopathy without macular edema, unspecified eye: Secondary | ICD-10-CM | POA: Diagnosis not present

## 2018-05-17 DIAGNOSIS — H25812 Combined forms of age-related cataract, left eye: Secondary | ICD-10-CM | POA: Diagnosis not present

## 2018-05-17 DIAGNOSIS — Z961 Presence of intraocular lens: Secondary | ICD-10-CM | POA: Diagnosis not present

## 2018-05-18 ENCOUNTER — Encounter: Payer: Self-pay | Admitting: Endocrinology

## 2018-05-18 ENCOUNTER — Ambulatory Visit (INDEPENDENT_AMBULATORY_CARE_PROVIDER_SITE_OTHER): Payer: Medicare Other | Admitting: Endocrinology

## 2018-05-18 VITALS — BP 120/64 | HR 107 | Ht 72.0 in | Wt 267.0 lb

## 2018-05-18 DIAGNOSIS — N183 Chronic kidney disease, stage 3 unspecified: Secondary | ICD-10-CM

## 2018-05-18 DIAGNOSIS — E1122 Type 2 diabetes mellitus with diabetic chronic kidney disease: Secondary | ICD-10-CM

## 2018-05-18 DIAGNOSIS — Z794 Long term (current) use of insulin: Secondary | ICD-10-CM

## 2018-05-18 LAB — POCT GLYCOSYLATED HEMOGLOBIN (HGB A1C): Hemoglobin A1C: 9 % — AB (ref 4.0–5.6)

## 2018-05-18 MED ORDER — INSULIN NPH ISOPHANE & REGULAR (70-30) 100 UNIT/ML ~~LOC~~ SUSP: SUBCUTANEOUS | 11 refills | Status: DC

## 2018-05-18 NOTE — Patient Instructions (Addendum)
Please increase the insulin to 130 units with breakfast, and 100 units with supper.    On this type of insulin schedule, you should eat meals on a regular schedule.  If a meal is missed or significantly.  delayed, your blood sugar could go low.   check your blood sugar twice a day.  vary the time of day when you check, between before the 3 meals, and at bedtime.  also check if you have symptoms of your blood sugar being too high or too low.  please keep a record of the readings and bring it to your next appointment here.  please call us sooner if your blood sugar goes below 70, or if you have a lot of readings over 200.   If you have any blood sugar under 80, please write on the paper why you think that was.   Please come back for a follow-up appointment in 2 months.

## 2018-05-18 NOTE — Progress Notes (Signed)
Subjective:    Patient ID: Justin Hill, male    DOB: [birth year], 77 y.o.   MRN: [id number]  HPI Pt returns for f/u of diabetes mellitus: DM type: Insulin-requiring type 2. Dx'ed: 1696 Complications: CHF, toe ulcer, partial toe amputation, polyneuropathy, and renal insufficiency.  Therapy: insulin since 2012.  DKA: never.   Severe hypoglycemia: never.    Pancreatitis: never.   Other: he takes human insulin for cost reasons; in October of 2014, he was changed from multiple daily injections to BID, due to persistently high a1c.  wife gives insulin, due to his memory loss Interval history: he takes 120 units qam and 90 units qpm.  wife brings a record of her cbg's which I have reviewed today. It varies from 138-300.  There is no trend throughout the day.  pt states he feels well in general.  No recent steroids.   Past Medical History:  Diagnosis Date  . Altered mental status   . Cardiomyopathy   . CHF (congestive heart failure) ([place])   . Chronic kidney disease (CKD), stage III (moderate) ([facility])   . Communicating hydrocephalus   . Diabetes mellitus    type 2  . Diverticulosis   . History of kidney stones   . Hypercholesterolemia   . Hyperglycemia   . Hypertension   . Paroxysmal atrial fibrillation ([facility])   . Personal history of kidney stones   . Pneumonia   . Sleep apnea    uses cpap    Past Surgical History:  Procedure Laterality Date  . ATRIAL FLUTTER ABLATION N/A 01/19/2015   Procedure: ATRIAL FLUTTER ABLATION;  Surgeon: [name], MD;  Location: [facility] CATH LAB;  Service: Cardiovascular;  Laterality: N/A;  . CARDIAC CATHETERIZATION N/A 05/02/2016   Procedure: Right/Left Heart Cath and Coronary Angiography;  Surgeon: Larey Dresser, MD;  Location: [place] CV LAB;  Service: Cardiovascular;  Laterality: N/A;  . COLONOSCOPY N/A 12/29/2013   Procedure: COLONOSCOPY;  Surgeon: Lafayette Dragon, MD;  Location: [facility] ENDOSCOPY;  Service: Endoscopy;  Laterality: N/A;  . EP IMPLANTABLE  DEVICE N/A 06/05/2016   MDT Hillery Aldo XT CRTD implanted by Dr Rayann Heman   . EYE SURGERY     cataract surgery (not sure which eye)  . [place]   left. Muscle reattachment  . TOE SURGERY Right 12/2012   2nd toe  . TONSILLECTOMY    . VENTRICULOPERITONEAL SHUNT Right 11/20/2017   Procedure: SHUNT PLACEMENT RIGHT;  Surgeon: Newman Pies, MD;  Location: [place];  Service: Neurosurgery;  Laterality: Right;  SHUNT PLACEMENT RIGHT    Social History   Socioeconomic History  . Marital status: Married    Spouse name: Rosemarie Beath  . Number of children: 1  . Years of education: 43  . Highest education level: Not on file  Occupational History  . Occupation: retired  Scientific laboratory technician  . Financial resource strain: Not on file  . Food insecurity:    Worry: Not on file    Inability: Not on file  . Transportation needs:    Medical: Not on file    Non-medical: Not on file  Tobacco Use  . Smoking status: Former Smoker    Types: Cigarettes  . Smokeless tobacco: Never Used  Substance and Sexual Activity  . Alcohol use: No  . Drug use: No  . Sexual activity: Not on file  Lifestyle  . Physical activity:    Days per week: Not on file    Minutes per session:  Not on file  . Stress: Not on file  Relationships  . Social connections:    Talks on phone: Not on file    Gets together: Not on file    Attends religious service: Not on file    Active member of club or organization: Not on file    Attends meetings of clubs or organizations: Not on file    Relationship status: Not on file  . Intimate partner violence:    Fear of current or ex partner: Not on file    Emotionally abused: Not on file    Physically abused: Not on file    Forced sexual activity: Not on file  Other Topics Concern  . Not on file  Social History Narrative   Regular exercise-no   Rare caffeine use     Current Outpatient Medications on File Prior to Visit  Medication Sig Dispense Refill  . Ascorbic Acid (VITAMIN  C) 1000 MG tablet Take 1 tablet by mouth 2 (two) times daily.     . carvedilol (COREG) 25 MG tablet Take 25 mg by mouth 2 (two) times daily with a meal.    . digoxin (LANOXIN) 0.125 MG tablet TAKE 1 TABLET BY MOUTH ONCE DAILY 90 tablet 3  . ENTRESTO 97-103 MG TAKE ONE TABLET BY MOUTH TWICE DAILY 180 tablet 3  . loratadine (CLARITIN) 10 MG tablet Take 1 tablet by mouth 2 (two) times daily.     . Multiple Vitamin (MULTIVITAMIN) tablet Take 1 tablet by mouth daily.      Marland Kitchen omeprazole (PRILOSEC) 40 MG capsule Take 40 mg by mouth daily. OTC    . simvastatin (ZOCOR) 40 MG tablet Take 40 mg by mouth at bedtime.      . tamsulosin (FLOMAX) 0.4 MG CAPS capsule Take 0.4 mg by mouth daily.     Marland Kitchen torsemide (DEMADEX) 10 MG tablet Take 1 tablet (10 mg total) by mouth every other day. 45 tablet 3  . XARELTO 20 MG TABS tablet TAKE 1 TABLET BY MOUTH ONCE DAILY WITH  SUPPER 90 tablet 3   No current facility-administered medications on file prior to visit.     Allergies  Allergen Reactions  . Penicillins Anaphylaxis and Other (See Comments)    SYNCOPE PATIENT HAS HAD A PCN REACTION WITH IMMEDIATE RASH, FACIAL/TONGUE/THROAT SWELLING, SOB, OR LIGHTHEADEDNESS WITH HYPOTENSION:  #  #  #  YES  #  #  #   Has patient had a PCN reaction causing severe rash involving mucus membranes or skin necrosis: no PATIENT HAS HAD A PCN REACTION THAT REQUIRED HOSPITALIZATION:  #  #  #  YES  #  #  #  Has patient had a PCN reaction occurring within the last 10 years: no    . Aspirin Other (See Comments)    Burps, burning, stomach pains, etc  . Lisinopril Cough    Severe coughing    Family History  Problem Relation Age of Onset  . Heart failure Maternal Grandmother   . Diabetes type II Sister 53  . Cancer Sister   . Hyperlipidemia Other        Parent  . Stroke Other        Parent  . Hypertension Other        Parent  . Diabetes Other        Parent  . Colon cancer Neg Hx   . Heart attack Neg Hx     BP 120/64 (BP  Location: Left Arm, Patient  Position: Sitting, Cuff Size: Normal)   Pulse (!) 107   Ht 6' (1.829 m)   Wt 267 lb (121.1 kg)   SpO2 94%   BMI 36.21 kg/m    Review of Systems He denies hypoglycemia    Objective:   Physical Exam VITAL SIGNS:  See vs page. GENERAL: no distress.  Pulses: foot pulses are intact bilaterally.   MSK: no deformity, except for hammer toes on the left foot. right 2nd toe is surgically absent.  CV: 2+ bilat edema of the legs.  Skin:  no ulcer on the feet or ankles, but the skin is very dry.  normal temp on the feet and ankles. There is bilateral onychomycosis and vv's.  Neuro: sensation is intact to touch on the feet and ankles, but decreased from normal.   Lab Results  Component Value Date   HGBA1C 9.0 (A) 05/18/2018      Assessment & Plan:  Insulin-requiring type 2 DM, with renal insuff: worse. Memory loss: he is not a candidate for multiple daily injections.   Patient Instructions  Please increase the insulin to 130 units with breakfast, and 100 units with supper.    On this type of insulin schedule, you should eat meals on a regular schedule.  If a meal is missed or significantly.  delayed, your blood sugar could go low.   check your blood sugar twice a day.  vary the time of day when you check, between before the 3 meals, and at bedtime.  also check if you have symptoms of your blood sugar being too high or too low.  please keep a record of the readings and bring it to your next appointment here.  please call us sooner if your blood sugar goes below 70, or if you have a lot of readings over 200.   If you have any blood sugar under 80, please write on the paper why you think that was.   Please come back for a follow-up appointment in 2 months.

## 2018-05-20 ENCOUNTER — Other Ambulatory Visit (HOSPITAL_COMMUNITY): Payer: Self-pay | Admitting: Cardiology

## 2018-05-20 MED ORDER — FENOFIBRATE 54 MG PO TABS: 54.0000 mg | ORAL_TABLET | Freq: Every day | ORAL | 3 refills | Status: DC

## 2018-05-24 ENCOUNTER — Telehealth: Payer: Self-pay | Admitting: Endocrinology

## 2018-05-24 NOTE — Telephone Encounter (Signed)
I spoke with patient's wife & all blood sugars have been over 200. Fasting the stay over 250 & the lowest has been 236. His blood sugar seems to be the highest in the afternoons. Please advise?

## 2018-05-24 NOTE — Telephone Encounter (Signed)
Please increase the insulin to 150 units with breakfast, and 110 units with supper. Please call or message [place] in a few days, to tell us how the blood sugar is doing

## 2018-05-24 NOTE — Telephone Encounter (Signed)
Patient's wife requests a call from Judson Roch. Ph# [phone]. Patient's readings are above 200 - she was told to let you know.

## 2018-05-24 NOTE — Telephone Encounter (Signed)
I have called & spoke with patient's wife to give dose increase. She stared that she would call back Friday to let us know how blood sugars were doing.

## 2018-06-01 ENCOUNTER — Telehealth: Payer: Self-pay | Admitting: Emergency Medicine

## 2018-06-01 LAB — CUP PACEART REMOTE DEVICE CHECK
Brady Statistic AP VP Percent: 2.72 %
Brady Statistic AP VS Percent: 0.07 %
Brady Statistic AS VP Percent: 95.84 %
Date Time Interrogation Session: 20190620052205
HighPow Impedance: 84 Ohm
Implantable Lead Implant Date: 20170713
Implantable Lead Implant Date: 20170713
Implantable Lead Location: 753858
Implantable Lead Location: 753859
Implantable Lead Location: 753860
Implantable Lead Model: 5076
Implantable Pulse Generator Implant Date: 20170713
Lead Channel Impedance Value: 399 Ohm
Lead Channel Impedance Value: 399 Ohm
Lead Channel Impedance Value: 418 Ohm
Lead Channel Impedance Value: 456 Ohm
Lead Channel Impedance Value: 722 Ohm
Lead Channel Impedance Value: 893 Ohm
Lead Channel Impedance Value: 893 Ohm
Lead Channel Impedance Value: 893 Ohm
Lead Channel Pacing Threshold Amplitude: 0.5 V
Lead Channel Pacing Threshold Amplitude: 0.5 V
Lead Channel Pacing Threshold Amplitude: 2.75 V
Lead Channel Pacing Threshold Pulse Width: 0.4 ms
Lead Channel Pacing Threshold Pulse Width: 0.4 ms
Lead Channel Pacing Threshold Pulse Width: 0.8 ms
Lead Channel Sensing Intrinsic Amplitude: 1.125 mV
Lead Channel Sensing Intrinsic Amplitude: 1.125 mV
Lead Channel Sensing Intrinsic Amplitude: 12 mV
Lead Channel Setting Pacing Amplitude: 2 V
Lead Channel Setting Sensing Sensitivity: 0.3 mV
MDC IDC LEAD IMPLANT DT: 20170713
MDC IDC MSMT BATTERY REMAINING LONGEVITY: 49 mo
MDC IDC MSMT BATTERY VOLTAGE: 2.97 V
MDC IDC MSMT LEADCHNL LV IMPEDANCE VALUE: 532 Ohm
MDC IDC MSMT LEADCHNL LV IMPEDANCE VALUE: 608 Ohm
MDC IDC MSMT LEADCHNL LV IMPEDANCE VALUE: 703 Ohm
MDC IDC MSMT LEADCHNL RV IMPEDANCE VALUE: 456 Ohm
MDC IDC MSMT LEADCHNL RV IMPEDANCE VALUE: 532 Ohm
MDC IDC MSMT LEADCHNL RV SENSING INTR AMPL: 12 mV
MDC IDC SET LEADCHNL LV PACING AMPLITUDE: 3.75 V
MDC IDC SET LEADCHNL LV PACING PULSEWIDTH: 0.8 ms
MDC IDC SET LEADCHNL RV PACING AMPLITUDE: 2.5 V
MDC IDC SET LEADCHNL RV PACING PULSEWIDTH: 0.4 ms
MDC IDC STAT BRADY AS VS PERCENT: 1.37 %
MDC IDC STAT BRADY RA PERCENT PACED: 2.75 %
MDC IDC STAT BRADY RV PERCENT PACED: 7.4 %

## 2018-06-01 NOTE — Telephone Encounter (Signed)
Pts wife called and stated patient is constantly having blood sugars over 200.14 day average is 263. What should he do at this point? Please advise thanks.

## 2018-06-02 NOTE — Telephone Encounter (Signed)
Please increase the insulin to 160 units with breakfast, and 120 units with supper. Please call or message [place] next week, to tell us how the blood sugar is doing

## 2018-06-02 NOTE — Telephone Encounter (Signed)
Explained dosage change to the patients wife and she verbalized an understanding

## 2018-06-03 ENCOUNTER — Other Ambulatory Visit: Payer: Self-pay | Admitting: Cardiology

## 2018-06-09 DIAGNOSIS — N183 Chronic kidney disease, stage 3 (moderate): Secondary | ICD-10-CM | POA: Diagnosis not present

## 2018-06-09 DIAGNOSIS — E1122 Type 2 diabetes mellitus with diabetic chronic kidney disease: Secondary | ICD-10-CM | POA: Diagnosis not present

## 2018-06-09 DIAGNOSIS — R413 Other amnesia: Secondary | ICD-10-CM | POA: Diagnosis not present

## 2018-06-09 DIAGNOSIS — Z794 Long term (current) use of insulin: Secondary | ICD-10-CM | POA: Diagnosis not present

## 2018-06-10 ENCOUNTER — Telehealth: Payer: Self-pay | Admitting: Endocrinology

## 2018-06-10 ENCOUNTER — Encounter: Payer: Self-pay | Admitting: Internal Medicine

## 2018-06-10 MED ORDER — INSULIN NPH ISOPHANE & REGULAR (70-30) 100 UNIT/ML ~~LOC~~ SUSP: SUBCUTANEOUS | 11 refills | Status: DC

## 2018-06-10 NOTE — Telephone Encounter (Signed)
Patients 7 day average blood sugar level is 232 Lowest Reading in the AMs is 193.  Please advise

## 2018-06-10 NOTE — Addendum Note (Signed)
Addended by: Renato Shin on: 06/10/2018 04:17 PM   Modules accepted: Orders

## 2018-06-10 NOTE — Telephone Encounter (Signed)
Please increase the insulin to 170 units with breakfast, and 120 units with supper.  Please call or message [place] next week, to tell us how the blood sugar is doing.

## 2018-06-11 ENCOUNTER — Other Ambulatory Visit (HOSPITAL_COMMUNITY): Payer: Self-pay | Admitting: Nurse Practitioner

## 2018-06-11 NOTE — Telephone Encounter (Signed)
This is Dr. Claris Gladden pt. CHF pt

## 2018-06-11 NOTE — Telephone Encounter (Signed)
I have notified patient's wife of dosage increase.

## 2018-06-14 ENCOUNTER — Other Ambulatory Visit (HOSPITAL_COMMUNITY): Payer: Self-pay | Admitting: *Deleted

## 2018-06-17 ENCOUNTER — Ambulatory Visit (INDEPENDENT_AMBULATORY_CARE_PROVIDER_SITE_OTHER): Payer: Medicare Other

## 2018-06-17 DIAGNOSIS — Z95810 Presence of automatic (implantable) cardiac defibrillator: Secondary | ICD-10-CM

## 2018-06-17 DIAGNOSIS — I5022 Chronic systolic (congestive) heart failure: Secondary | ICD-10-CM | POA: Diagnosis not present

## 2018-06-17 NOTE — Progress Notes (Signed)
EPIC Encounter for ICM Monitoring  Patient Name: Justin Hill is a 77 y.o. male Date: 06/17/2018 Primary Care Physican: Burgart, Jennifer, MD Primary Cardiologist:[facility] Electrophysiologist: Allred Dry Weight:260lbs Bi-V Pacing: 97.8%  Clinical Status (16-Apr-2018 to 13-May-2018) Treated VT/VF 0 episodes  AT/AF 5 episodes  Time in AT/AF <0.1 hr/day (0.2%)        Spoke with wife.  Heart Failure questions reviewed, pt asymptomatic.  Occasionally he has shortness of breath that happens when he is in and out of the heat but goes away when he rests.   Thoracic impedance normal.  Prescribed dosage: Torsemide 10 mg 1 tablet every other day.   Labs: 05/04/2018 Creatinine 1.53, BUN 10, Potassium 5.2, Sodium 138, EGFR 43-50 02/01/2018 Creatinine 1.42, BUN 10, Potassium 4.5, Sodium 137, EGFR 47-55 11/18/2017 Creatinine 1.45, BUN 12, Potassium 4.2, Sodium 139, EGFR 46-53 05/29/2017 Creatinine 1.32, BUN 13, Potassium 4.7, Sodium 134, EGFR 51-60 03/24/2017 Creatinine 1.47, BUN 25, Potassium 5.2, Sodium 133, EGFR 46-54 01/09/2017 Creatinine 1.42, BUN 15, Potassium 4.6, Sodium 136, EGFR 47-55 12/22/2016 Creatinine 1.56, BUN 22, Potassium 5.0, Sodium 138, EGFR 43-50 12/02/2016 Creatinine 1.20, BUN 14, Potassium 4.9, Sodium 138, EGFR >60  Recommendations: No changes.  Encouraged to call for fluid symptoms.  Follow-up plan: ICM clinic phone appointment on 07/19/2018.    Copy of ICM check sent to Dr. Allred.   3 month ICM trend: 06/17/2018    1 Year ICM trend:       Laurie S Short, RN 06/17/2018 9:52 AM   

## 2018-07-09 DIAGNOSIS — Z6837 Body mass index (BMI) 37.0-37.9, adult: Secondary | ICD-10-CM | POA: Diagnosis not present

## 2018-07-09 DIAGNOSIS — G912 (Idiopathic) normal pressure hydrocephalus: Secondary | ICD-10-CM | POA: Diagnosis not present

## 2018-07-15 DIAGNOSIS — I6782 Cerebral ischemia: Secondary | ICD-10-CM | POA: Diagnosis not present

## 2018-07-15 DIAGNOSIS — G919 Hydrocephalus, unspecified: Secondary | ICD-10-CM | POA: Diagnosis not present

## 2018-07-15 DIAGNOSIS — Z982 Presence of cerebrospinal fluid drainage device: Secondary | ICD-10-CM | POA: Diagnosis not present

## 2018-07-18 DIAGNOSIS — R410 Disorientation, unspecified: Secondary | ICD-10-CM | POA: Diagnosis not present

## 2018-07-18 DIAGNOSIS — R0602 Shortness of breath: Secondary | ICD-10-CM | POA: Diagnosis not present

## 2018-07-18 DIAGNOSIS — G92 Toxic encephalopathy: Secondary | ICD-10-CM | POA: Diagnosis not present

## 2018-07-18 DIAGNOSIS — M1990 Unspecified osteoarthritis, unspecified site: Secondary | ICD-10-CM | POA: Diagnosis present

## 2018-07-18 DIAGNOSIS — I959 Hypotension, unspecified: Secondary | ICD-10-CM | POA: Diagnosis not present

## 2018-07-18 DIAGNOSIS — Z79899 Other long term (current) drug therapy: Secondary | ICD-10-CM | POA: Diagnosis not present

## 2018-07-18 DIAGNOSIS — E875 Hyperkalemia: Secondary | ICD-10-CM | POA: Diagnosis present

## 2018-07-18 DIAGNOSIS — E785 Hyperlipidemia, unspecified: Secondary | ICD-10-CM | POA: Diagnosis present

## 2018-07-18 DIAGNOSIS — E871 Hypo-osmolality and hyponatremia: Secondary | ICD-10-CM | POA: Diagnosis present

## 2018-07-18 DIAGNOSIS — R4182 Altered mental status, unspecified: Secondary | ICD-10-CM | POA: Diagnosis not present

## 2018-07-18 DIAGNOSIS — N138 Other obstructive and reflux uropathy: Secondary | ICD-10-CM | POA: Diagnosis present

## 2018-07-18 DIAGNOSIS — E872 Acidosis: Secondary | ICD-10-CM | POA: Diagnosis present

## 2018-07-18 DIAGNOSIS — I129 Hypertensive chronic kidney disease with stage 1 through stage 4 chronic kidney disease, or unspecified chronic kidney disease: Secondary | ICD-10-CM | POA: Diagnosis not present

## 2018-07-18 DIAGNOSIS — E1122 Type 2 diabetes mellitus with diabetic chronic kidney disease: Secondary | ICD-10-CM | POA: Diagnosis present

## 2018-07-18 DIAGNOSIS — I4891 Unspecified atrial fibrillation: Secondary | ICD-10-CM | POA: Diagnosis present

## 2018-07-18 DIAGNOSIS — Z950 Presence of cardiac pacemaker: Secondary | ICD-10-CM | POA: Diagnosis not present

## 2018-07-18 DIAGNOSIS — Z95810 Presence of automatic (implantable) cardiac defibrillator: Secondary | ICD-10-CM | POA: Diagnosis not present

## 2018-07-18 DIAGNOSIS — K219 Gastro-esophageal reflux disease without esophagitis: Secondary | ICD-10-CM | POA: Diagnosis present

## 2018-07-18 DIAGNOSIS — N201 Calculus of ureter: Secondary | ICD-10-CM | POA: Diagnosis not present

## 2018-07-18 DIAGNOSIS — E1165 Type 2 diabetes mellitus with hyperglycemia: Secondary | ICD-10-CM | POA: Diagnosis not present

## 2018-07-18 DIAGNOSIS — I10 Essential (primary) hypertension: Secondary | ICD-10-CM | POA: Diagnosis not present

## 2018-07-18 DIAGNOSIS — Z982 Presence of cerebrospinal fluid drainage device: Secondary | ICD-10-CM | POA: Diagnosis not present

## 2018-07-18 DIAGNOSIS — R4020 Unspecified coma: Secondary | ICD-10-CM | POA: Diagnosis not present

## 2018-07-18 DIAGNOSIS — N10 Acute tubulo-interstitial nephritis: Secondary | ICD-10-CM | POA: Diagnosis present

## 2018-07-18 DIAGNOSIS — I509 Heart failure, unspecified: Secondary | ICD-10-CM | POA: Diagnosis not present

## 2018-07-18 DIAGNOSIS — Z8661 Personal history of infections of the central nervous system: Secondary | ICD-10-CM | POA: Diagnosis not present

## 2018-07-18 DIAGNOSIS — N183 Chronic kidney disease, stage 3 (moderate): Secondary | ICD-10-CM | POA: Diagnosis present

## 2018-07-18 DIAGNOSIS — Z7901 Long term (current) use of anticoagulants: Secondary | ICD-10-CM | POA: Diagnosis not present

## 2018-07-18 DIAGNOSIS — Z87438 Personal history of other diseases of male genital organs: Secondary | ICD-10-CM | POA: Diagnosis not present

## 2018-07-18 DIAGNOSIS — N132 Hydronephrosis with renal and ureteral calculous obstruction: Secondary | ICD-10-CM | POA: Diagnosis not present

## 2018-07-18 DIAGNOSIS — R1111 Vomiting without nausea: Secondary | ICD-10-CM | POA: Diagnosis not present

## 2018-07-18 DIAGNOSIS — A419 Sepsis, unspecified organism: Secondary | ICD-10-CM | POA: Diagnosis not present

## 2018-07-18 DIAGNOSIS — N401 Enlarged prostate with lower urinary tract symptoms: Secondary | ICD-10-CM | POA: Diagnosis present

## 2018-07-18 DIAGNOSIS — N202 Calculus of kidney with calculus of ureter: Secondary | ICD-10-CM | POA: Diagnosis not present

## 2018-07-18 DIAGNOSIS — Z794 Long term (current) use of insulin: Secondary | ICD-10-CM | POA: Diagnosis not present

## 2018-07-18 DIAGNOSIS — Z87891 Personal history of nicotine dependence: Secondary | ICD-10-CM | POA: Diagnosis not present

## 2018-07-18 DIAGNOSIS — N179 Acute kidney failure, unspecified: Secondary | ICD-10-CM | POA: Diagnosis present

## 2018-07-18 DIAGNOSIS — E119 Type 2 diabetes mellitus without complications: Secondary | ICD-10-CM | POA: Diagnosis not present

## 2018-07-18 DIAGNOSIS — N189 Chronic kidney disease, unspecified: Secondary | ICD-10-CM | POA: Diagnosis not present

## 2018-07-19 ENCOUNTER — Telehealth: Payer: Self-pay | Admitting: Endocrinology

## 2018-07-19 NOTE — Telephone Encounter (Signed)
I no longer go to the hospital.  Please call them and tell them you are concerned it is running high.

## 2018-07-19 NOTE — Telephone Encounter (Signed)
Please advise on below  

## 2018-07-19 NOTE — Telephone Encounter (Signed)
Patient wife stated patient is in the hospital. They are giving him "regular" insulin. And she stated his numbers are still running around the 300 and she is not sure what to do   Please advise

## 2018-07-20 ENCOUNTER — Ambulatory Visit: Payer: Medicare Other | Admitting: Endocrinology

## 2018-07-20 DIAGNOSIS — I10 Essential (primary) hypertension: Secondary | ICD-10-CM | POA: Diagnosis not present

## 2018-07-20 DIAGNOSIS — I509 Heart failure, unspecified: Secondary | ICD-10-CM | POA: Diagnosis not present

## 2018-07-20 DIAGNOSIS — Z95810 Presence of automatic (implantable) cardiac defibrillator: Secondary | ICD-10-CM | POA: Diagnosis not present

## 2018-07-20 DIAGNOSIS — N132 Hydronephrosis with renal and ureteral calculous obstruction: Secondary | ICD-10-CM | POA: Diagnosis not present

## 2018-07-20 DIAGNOSIS — E119 Type 2 diabetes mellitus without complications: Secondary | ICD-10-CM | POA: Diagnosis not present

## 2018-07-20 NOTE — Telephone Encounter (Signed)
LVM stating since Dr. Loanne Drilling no longer goes to the hospital that she needed to discuss her concerns with attending doctor at the hospital.

## 2018-07-21 DIAGNOSIS — R4182 Altered mental status, unspecified: Secondary | ICD-10-CM

## 2018-07-21 NOTE — Progress Notes (Signed)
No ICM remote transmission received for 07/19/2018 and next ICM transmission scheduled for 08/19/2018.

## 2018-07-29 ENCOUNTER — Ambulatory Visit (INDEPENDENT_AMBULATORY_CARE_PROVIDER_SITE_OTHER): Payer: Medicare Other | Admitting: Endocrinology

## 2018-07-29 ENCOUNTER — Encounter: Payer: Self-pay | Admitting: Endocrinology

## 2018-07-29 VITALS — BP 122/58 | HR 79 | Ht 72.0 in | Wt 258.4 lb

## 2018-07-29 DIAGNOSIS — E1122 Type 2 diabetes mellitus with diabetic chronic kidney disease: Secondary | ICD-10-CM

## 2018-07-29 DIAGNOSIS — Z794 Long term (current) use of insulin: Secondary | ICD-10-CM | POA: Diagnosis not present

## 2018-07-29 DIAGNOSIS — N183 Chronic kidney disease, stage 3 unspecified: Secondary | ICD-10-CM

## 2018-07-29 LAB — POCT GLYCOSYLATED HEMOGLOBIN (HGB A1C): Hemoglobin A1C: 8.6 % — AB (ref 4.0–5.6)

## 2018-07-29 MED ORDER — INSULIN NPH ISOPHANE & REGULAR (70-30) 100 UNIT/ML ~~LOC~~ SUSP: SUBCUTANEOUS | 11 refills | Status: DC

## 2018-07-29 NOTE — Patient Instructions (Addendum)
Please reduce the insulin to 75 units with breakfast, and 60 units with supper.    Please call or message [place] next week, to tell us how the blood sugar is doing. On this type of insulin schedule, you should eat meals on a regular schedule.  If a meal is missed or significantly.  delayed, your blood sugar could go low.   With time, your insulin need will increase back up to what it was before you went into the hospital. check your blood sugar twice a day.  vary the time of day when you check, between before the 3 meals, and at bedtime.  also check if you have symptoms of your blood sugar being too high or too low.  please keep a record of the readings and bring it to your next appointment here.  please call us sooner if your blood sugar goes below 70, or if you have a lot of readings over 200.   If you have any blood sugar under 80, please write on the paper why you think that was.   Please come back for a follow-up appointment in 2 months.

## 2018-07-29 NOTE — Progress Notes (Signed)
Subjective:    Patient ID: Justin Hill, male    DOB: 10-Oct-1941, 77 y.o.   MRN: [id number]  HPI Pt returns for f/u of diabetes mellitus: DM type: Insulin-requiring type 2. Dx'ed: 1638 Complications: CHF, toe ulcer, partial toe amputation, polyneuropathy, and renal insufficiency.  Therapy: insulin since 2012.  DKA: never.   Severe hypoglycemia: never.    Pancreatitis: never.    Other: he takes human insulin for cost reasons; in October of 2014, he was changed from multiple daily injections to BID, due to persistently high a1c.  wife gives insulin, due to his memory loss.   Interval history: He was in [facility] last week (urolithiasis).  Since back home, he takes 75 units BID.  wife brings a record of her cbg's which I have reviewed today.  It varies from 45-206.  It is in general higher as the day goes on.   Past Medical History:  Diagnosis Date  . Altered mental status   . Cardiomyopathy   . CHF (congestive heart failure) ([place])   . Chronic kidney disease (CKD), stage III (moderate) (HCC)   . Communicating hydrocephalus   . Diabetes mellitus    type 2  . Diverticulosis   . History of kidney stones   . Hypercholesterolemia   . Hyperglycemia   . Hypertension   . Paroxysmal atrial fibrillation (HCC)   . Personal history of kidney stones   . Pneumonia   . Sleep apnea    uses cpap    Past Surgical History:  Procedure Laterality Date  . ATRIAL FLUTTER ABLATION N/A 01/19/2015   Procedure: ATRIAL FLUTTER ABLATION;  Surgeon: [name], MD;  Location: [facility] CATH LAB;  Service: Cardiovascular;  Laterality: N/A;  . CARDIAC CATHETERIZATION N/A 05/02/2016   Procedure: Right/Left Heart Cath and Coronary Angiography;  Surgeon: Larey Dresser, MD;  Location: [place] CV LAB;  Service: Cardiovascular;  Laterality: N/A;  . COLONOSCOPY N/A 12/29/2013   Procedure: COLONOSCOPY;  Surgeon: Lafayette Dragon, MD;  Location: [facility] ENDOSCOPY;  Service: Endoscopy;  Laterality: N/A;  . EP  IMPLANTABLE DEVICE N/A 06/05/2016   MDT Hillery Aldo XT CRTD implanted by Dr Rayann Heman   . EYE SURGERY     cataract surgery (not sure which eye)  . [place]   left. Muscle reattachment  . TOE SURGERY Right 12/2012   2nd toe  . TONSILLECTOMY    . VENTRICULOPERITONEAL SHUNT Right 11/20/2017   Procedure: SHUNT PLACEMENT RIGHT;  Surgeon: Newman Pies, MD;  Location: [place];  Service: Neurosurgery;  Laterality: Right;  SHUNT PLACEMENT RIGHT    Social History   Socioeconomic History  . Marital status: Married    Spouse name: Rosemarie Beath  . Number of children: 1  . Years of education: 29  . Highest education level: Not on file  Occupational History  . Occupation: retired  Scientific laboratory technician  . Financial resource strain: Not on file  . Food insecurity:    Worry: Not on file    Inability: Not on file  . Transportation needs:    Medical: Not on file    Non-medical: Not on file  Tobacco Use  . Smoking status: Former Smoker    Types: Cigarettes  . Smokeless tobacco: Never Used  Substance and Sexual Activity  . Alcohol use: No  . Drug use: No  . Sexual activity: Not on file  Lifestyle  . Physical activity:    Days per week: Not on file  Minutes per session: Not on file  . Stress: Not on file  Relationships  . Social connections:    Talks on phone: Not on file    Gets together: Not on file    Attends religious service: Not on file    Active member of club or organization: Not on file    Attends meetings of clubs or organizations: Not on file    Relationship status: Not on file  . Intimate partner violence:    Fear of current or ex partner: Not on file    Emotionally abused: Not on file    Physically abused: Not on file    Forced sexual activity: Not on file  Other Topics Concern  . Not on file  Social History Narrative   Regular exercise-no   Rare caffeine use     Current Outpatient Medications on File Prior to Visit  Medication Sig Dispense Refill  . Ascorbic  Acid (VITAMIN C) 1000 MG tablet Take 1 tablet by mouth 2 (two) times daily.     . carvedilol (COREG) 25 MG tablet Take 25 mg by mouth 2 (two) times daily with a meal.    . digoxin (LANOXIN) 0.125 MG tablet TAKE 1 TABLET BY MOUTH ONCE DAILY 90 tablet 3  . ENTRESTO 97-103 MG TAKE 1 TABLET BY MOUTH TWICE DAILY 180 tablet 1  . fenofibrate 54 MG tablet Take 1 tablet (54 mg total) by mouth daily. 90 tablet 3  . Lactobacillus (ACIDOPHILUS PO) Take by mouth daily.    Marland Kitchen linezolid (ZYVOX) 600 MG tablet Take 600 mg by mouth 2 (two) times daily.  0  . loratadine (CLARITIN) 10 MG tablet Take 1 tablet by mouth 2 (two) times daily.     . Multiple Vitamin (MULTIVITAMIN) tablet Take 1 tablet by mouth daily.      Marland Kitchen omeprazole (PRILOSEC) 40 MG capsule Take 40 mg by mouth daily. OTC    . simvastatin (ZOCOR) 40 MG tablet Take 40 mg by mouth at bedtime.      . tamsulosin (FLOMAX) 0.4 MG CAPS capsule Take 0.4 mg by mouth daily.     Marland Kitchen torsemide (DEMADEX) 10 MG tablet Take 1 tablet (10 mg total) by mouth every other day. 45 tablet 3  . XARELTO 20 MG TABS tablet TAKE 1 TABLET BY MOUTH ONCE DAILY WITH  SUPPER 90 tablet 3   No current facility-administered medications on file prior to visit.     Allergies  Allergen Reactions  . Penicillins Anaphylaxis and Other (See Comments)    SYNCOPE PATIENT HAS HAD A PCN REACTION WITH IMMEDIATE RASH, FACIAL/TONGUE/THROAT SWELLING, SOB, OR LIGHTHEADEDNESS WITH HYPOTENSION:  #  #  #  YES  #  #  #   Has patient had a PCN reaction causing severe rash involving mucus membranes or skin necrosis: no PATIENT HAS HAD A PCN REACTION THAT REQUIRED HOSPITALIZATION:  #  #  #  YES  #  #  #  Has patient had a PCN reaction occurring within the last 10 years: no    . Aspirin Other (See Comments)    Burps, burning, stomach pains, etc  . Lisinopril Cough    Severe coughing    Family History  Problem Relation Age of Onset  . Heart failure Maternal Grandmother   . Diabetes type II Sister 64   . Cancer Sister   . Hyperlipidemia Other        Parent  . Stroke Other  Parent  . Hypertension Other        Parent  . Diabetes Other        Parent  . Colon cancer Neg Hx   . Heart attack Neg Hx     BP (!) 122/58 (BP Location: Right Arm, Patient Position: Sitting, Cuff Size: Normal)   Pulse 79   Ht 6' (1.829 m)   Wt 258 lb 6.4 oz (117.2 kg)   SpO2 95%   BMI 35.05 kg/m    Review of Systems Denies LOC.  He has lost 9 lbs, since last ov here    Objective:   Physical Exam VITAL SIGNS:  See vs page. GENERAL: no distress.  Pulses: foot pulses are intact bilaterally.   MSK: hammer toes on the left foot. right 2nd toe is surgically absent.  CV: 1+ bilat edema of the legs, and bilat vv's Skin:  no ulcer on the feet or ankles, but the skin is very dry.  normal temp on the feet and ankles. There is bilateral onychomycosis.    Neuro: sensation is intact to touch on the feet and ankles, but decreased from normal.     Lab Results  Component Value Date   HGBA1C 8.6 (A) 07/29/2018   (wife says creat was much worse in the hospital than prior)    Assessment & Plan:  Insulin-requiring type 2 DM, with polyneuropathy: overcontrolled, prob due to weight loss--this overcontrol is too recent to be measured by a1c.  Renal failure: worse: this is also contributing to decreased insulin need.   Patient Instructions  Please reduce the insulin to 75 units with breakfast, and 60 units with supper.    Please call or message [place] next week, to tell us how the blood sugar is doing. On this type of insulin schedule, you should eat meals on a regular schedule.  If a meal is missed or significantly.  delayed, your blood sugar could go low.   With time, your insulin need will increase back up to what it was before you went into the hospital. check your blood sugar twice a day.  vary the time of day when you check, between before the 3 meals, and at bedtime.  also check if you have symptoms of your  blood sugar being too high or too low.  please keep a record of the readings and bring it to your next appointment here.  please call us sooner if your blood sugar goes below 70, or if you have a lot of readings over 200.   If you have any blood sugar under 80, please write on the paper why you think that was.   Please come back for a follow-up appointment in 2 months.

## 2018-08-02 DIAGNOSIS — Z7189 Other specified counseling: Secondary | ICD-10-CM | POA: Diagnosis not present

## 2018-08-02 DIAGNOSIS — A419 Sepsis, unspecified organism: Secondary | ICD-10-CM | POA: Diagnosis not present

## 2018-08-02 DIAGNOSIS — D72829 Elevated white blood cell count, unspecified: Secondary | ICD-10-CM | POA: Diagnosis not present

## 2018-08-02 DIAGNOSIS — N179 Acute kidney failure, unspecified: Secondary | ICD-10-CM | POA: Diagnosis not present

## 2018-08-04 DIAGNOSIS — N200 Calculus of kidney: Secondary | ICD-10-CM | POA: Diagnosis not present

## 2018-08-04 DIAGNOSIS — N401 Enlarged prostate with lower urinary tract symptoms: Secondary | ICD-10-CM | POA: Diagnosis not present

## 2018-08-05 NOTE — Progress Notes (Signed)
Patient ID: Justin Hill, male   DOB: 1941-03-21, 77 y.o.   MRN: [id number] PCP: Dr. Camillia Herter HF Cardiology: Dr. Aundra Dubin Urology: Dr Lajuan Lines   77 y.o. with history CKD, HTN, diabetes, chronic LBBB, and nonischemic cardiomyopathy presents for followup of CHF.  His cardiomyopathy has been known for years.  He had a LHC in 2008 showing mild nonobstructive CAD.     Justin Hill was admitted in 2/16 with atrial flutter degenerating into atrial fibrillation.  He was initially cardioverted, then atrial flutter recurred.  He had atrial flutter ablation by Dr Rayann Heman in 2/16.  He thinks that he has been in NSR since that time, no further palpitations. He is on Xarelto now with no melena or BRBPR.    He is s/p placement of Medtronic CRT-D device.  Echo in 1/18 showed improvement in EF to 40-45%.  Echo in 3/19 showed EF 40-45%.   He developed normal pressure hydrocephalus and had VP shunt placed.  This has helped with his walking and some with his memory.   Hospitalized the end of August at [facility] with kidney stones requiring stent. Hospitalization prolonged due to sepsis and AKI. Wife reports creatinine went up to > 2.8. He was off diuretics and just restarted at home on 07/24/2018.   Today he returns for HF follow up. Overall feeling fine. Denies SOB/PND/Orthopnea. No walking around much. Appetite poor. No fever or chills. Using CPAP at home. Weight at home has been 250  pounds. Taking all medications.   Medtronic device interrogation: Impedance going back up. Fluid index elevated. Acitivity ~1 hour per day. No VT.  100% BiV pacing.   Labs (5/14): K 4, creatinine 1.4, LFTs normal, LDL 46, HDL 37 Labs (1/15): K 4.4, creatinine 1.46, LDL 74, HDL 35 Labs (4/15); LDL 96, HDL 44, K 5.2, creatinine 1.4 Labs (1/16): K 4.9, creatinine 1.36 Labs (2/16): K 4.5, creatinine 1.56, BNP 412 Labs (9/16): K 4.8, creatinine 1.33, hgb 14.7 Labs (11/16): K 4.3, creatinine 1.37, BNP 52 Labs (1/17): K 4.5 => 5,  creatinine 1.35 => 1.17, HCT 47.4 Labs (2/17): K 4.9, creatinine 1.35 Labs (5/17): LDL 48, LFTs normal Labs (9/17): K 5, creatinine 1.21 Labs (11/17): HCT 41.4, digoxin 0.3, K 4.9, creatinine 1.27 Labs (5/18): K 5.2, creatinine 1.47 Labs (7/18): digoxin 0.3 Labs (9/18): LDL 82, HDL 49, K 4.5, creatinine 1.34 Labs (3/19): digoxin 0.4, K 4.5, creatinine 1.42 Labs (6/19): K 5.2  PMH: 1. Type II diabetes with peripheral neuropathy.  2. CKD 3. Hyperlipidemia 4. HTN: ACEI cough.  5. Nonischemic cardiomyopathy: This was diagnosed prior to 2008.  In 2008, patient had LHC with only mild nonobstructive coronary disease.  Echo in 1/14 showed EF 25-30% with normal RV.  Patient has not tolerated ACEI due to hyperkalemia and AKI. ICD was discussed (Dr Lovena Le) and decided against given NYHA class I symptoms.  Echo (7/15) with EF 25-30%, septal-lateral dyssynchrony, mild LV dilation. Echo (6/16) with EF 25-30%, mild LVH, septal-lateral dyssynchrony, normal RV size and systolic function. CPX (12/16) with peak VO2 14.5 (69% predicted), VE/VCO2 34, RER 1.19 => mild functional impairment from heart failure, restrictive lung function likely due to body habitus.  - LHC/RHC (6/17): 30% mid LAD stenosis; mean RA 5, PA 33/10, PCWP mean 16, CI 1.95.  - Medtronic CRT-D placed 7/17.  - Echo (1/18): EF 40-45%, mild LVH.  - Echo (3/19): EF 40-45%, moderate LVH, normal RV size/function.  6. Chronic LBBB 7. Obesity 8. Atrial flutter and atrial fibrillation:  S/p atrial flutter ablation in 2/16 ([facility]).  9. Normal pressure hydrocephalus: s/p VP shunt.  10. OSA: Uses CPAP.   SH: Lives in [place], married, prior smoker, no ETOH.  1 child.   FH: Grandmother with CHF.  Mother with CVA.   ROS: All systems reviewed and negative except as per HPI.    Current Outpatient Medications  Medication Sig Dispense Refill  . Ascorbic Acid (VITAMIN C) 1000 MG tablet Take 1 tablet by mouth 2 (two) times daily.     . carvedilol  (COREG) 25 MG tablet Take 25 mg by mouth 2 (two) times daily with a meal.    . digoxin (LANOXIN) 0.125 MG tablet TAKE 1 TABLET BY MOUTH ONCE DAILY 90 tablet 3  . ENTRESTO 97-103 MG TAKE 1 TABLET BY MOUTH TWICE DAILY 180 tablet 1  . fenofibrate 54 MG tablet Take 1 tablet (54 mg total) by mouth daily. 90 tablet 3  . insulin NPH-regular Human (NOVOLIN 70/30) (70-30) 100 UNIT/ML injection 75 units with breakfast, and 60 units with supper, and syringes 2/day 100 mL 11  . Lactobacillus (ACIDOPHILUS PO) Take by mouth daily.    Marland Kitchen loratadine (CLARITIN) 10 MG tablet Take 1 tablet by mouth 2 (two) times daily.     . Multiple Vitamin (MULTIVITAMIN) tablet Take 1 tablet by mouth daily.      Marland Kitchen omeprazole (PRILOSEC) 40 MG capsule Take 40 mg by mouth daily. OTC    . Potassium Citrate (UROCIT-K 15) 15 MEQ (1620 MG) TBCR Take 1 tablet by mouth daily.    . simvastatin (ZOCOR) 40 MG tablet Take 40 mg by mouth at bedtime.      . tamsulosin (FLOMAX) 0.4 MG CAPS capsule Take 0.4 mg by mouth daily.     Marland Kitchen torsemide (DEMADEX) 10 MG tablet Take 1 tablet (10 mg total) by mouth every other day. 45 tablet 3  . XARELTO 20 MG TABS tablet TAKE 1 TABLET BY MOUTH ONCE DAILY WITH  SUPPER 90 tablet 3   No current facility-administered medications for this encounter.     BP (!) 118/54 (BP Location: Right Arm, Patient Position: Sitting, Cuff Size: Large)   Pulse 72   Wt 114.8 kg (253 lb)   SpO2 97%   BMI 34.31 kg/m   Wt Readings from Last 3 Encounters:  08/06/18 114.8 kg (253 lb)  07/29/18 117.2 kg (258 lb 6.4 oz)  05/18/18 121.1 kg (267 lb)   Lab Results  Component Value Date   CREATININE 1.53 (H) 05/04/2018   CREATININE 1.42 (H) 02/01/2018   CREATININE 1.45 (H) 11/18/2017    General:  Appears chronically ill.  No resp difficulty HEENT: normal Neck: supple. JVP 7-8 . Carotids 2+ bilat; no bruits. No lymphadenopathy or thryomegaly appreciated. Cor: PMI nondisplaced. Regular rate & rhythm. No rubs, gallops or  murmurs. Lungs: clear Abdomen: soft, nontender, nondistended. No hepatosplenomegaly. No bruits or masses. Good bowel sounds. Extremities: no cyanosis, clubbing, rash, edema Neuro: alert & orientedx3, cranial nerves grossly intact. moves all 4 extremities w/o difficulty. Affect pleasant  Assessment/Plan: 1. Cardiomyopathy: Nonischemic.  Low EF x years. Echo 6/16 showed stable EF 25-30% with septal-lateral dyssynchrony.  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  Interestingly, it also appears that he has a long-standing LBBB.  A chronic LBBB itself can potentially cause a cardiomyopathy and may be the source of his low EF.   Coronary angiography in 6/17 with nonobstructive disease.  He has Medtronic CRT-D device placement.   Most recent echo in 3/19 with EF up to 40-45%.   - NYHA II. Volume status mildly elevated but with recent AKI will continue torsemide 10 mg every other day.  Continue Coreg, he is at goal dose. - Continue Entresto 97/103 bid.   -- Continue torsemide 10 mg every other day.    - Stop digoxin with recent AKI and EF 40-45%.  - No on spiro with hyperkalemia. 2. Hyperlipidemia: Patient is on simvastatin.  9/18 lipids ok.  3. CKD:  Creatinine up 2.8 I have requested a copy of his BMET from his PCP  4. Atrial flutter: s/p ablation 5. Atrial fibrillation: Patient had afib noted as well as flutter.   Regular Rate.  Continue xarelto long term.   6. Normal pressure hydrocephalus: Has VP shunt.   7. OSA: Continue to use CPAP.    Follow up 6-8 weeks.   Had lab work earlier this week at his PCP.    Sheree Lalla NP-C  08/06/2018

## 2018-08-06 ENCOUNTER — Encounter (HOSPITAL_COMMUNITY): Payer: Self-pay

## 2018-08-06 ENCOUNTER — Ambulatory Visit (HOSPITAL_COMMUNITY)
Admission: RE | Admit: 2018-08-06 | Discharge: 2018-08-06 | Disposition: A | Discharge status: Home / Self Care | Payer: Medicare Other | Source: Ambulatory Visit | Attending: Internal Medicine | Admitting: Internal Medicine

## 2018-08-06 VITALS — BP 118/54 | HR 72 | Wt 253.0 lb

## 2018-08-06 DIAGNOSIS — I447 Left bundle-branch block, unspecified: Secondary | ICD-10-CM | POA: Insufficient documentation

## 2018-08-06 DIAGNOSIS — Z79899 Other long term (current) drug therapy: Secondary | ICD-10-CM | POA: Insufficient documentation

## 2018-08-06 DIAGNOSIS — Z982 Presence of cerebrospinal fluid drainage device: Secondary | ICD-10-CM | POA: Diagnosis not present

## 2018-08-06 DIAGNOSIS — Z7901 Long term (current) use of anticoagulants: Secondary | ICD-10-CM | POA: Insufficient documentation

## 2018-08-06 DIAGNOSIS — Z95810 Presence of automatic (implantable) cardiac defibrillator: Secondary | ICD-10-CM | POA: Diagnosis not present

## 2018-08-06 DIAGNOSIS — N189 Chronic kidney disease, unspecified: Secondary | ICD-10-CM | POA: Insufficient documentation

## 2018-08-06 DIAGNOSIS — E1142 Type 2 diabetes mellitus with diabetic polyneuropathy: Secondary | ICD-10-CM | POA: Insufficient documentation

## 2018-08-06 DIAGNOSIS — E669 Obesity, unspecified: Secondary | ICD-10-CM | POA: Insufficient documentation

## 2018-08-06 DIAGNOSIS — I428 Other cardiomyopathies: Secondary | ICD-10-CM | POA: Diagnosis not present

## 2018-08-06 DIAGNOSIS — G4733 Obstructive sleep apnea (adult) (pediatric): Secondary | ICD-10-CM | POA: Diagnosis not present

## 2018-08-06 DIAGNOSIS — G912 (Idiopathic) normal pressure hydrocephalus: Secondary | ICD-10-CM | POA: Insufficient documentation

## 2018-08-06 DIAGNOSIS — I130 Hypertensive heart and chronic kidney disease with heart failure and stage 1 through stage 4 chronic kidney disease, or unspecified chronic kidney disease: Secondary | ICD-10-CM | POA: Insufficient documentation

## 2018-08-06 DIAGNOSIS — E1122 Type 2 diabetes mellitus with diabetic chronic kidney disease: Secondary | ICD-10-CM | POA: Diagnosis not present

## 2018-08-06 DIAGNOSIS — Z794 Long term (current) use of insulin: Secondary | ICD-10-CM | POA: Diagnosis not present

## 2018-08-06 DIAGNOSIS — I509 Heart failure, unspecified: Secondary | ICD-10-CM | POA: Diagnosis not present

## 2018-08-06 DIAGNOSIS — Z87891 Personal history of nicotine dependence: Secondary | ICD-10-CM | POA: Insufficient documentation

## 2018-08-06 DIAGNOSIS — Z9989 Dependence on other enabling machines and devices: Secondary | ICD-10-CM

## 2018-08-06 DIAGNOSIS — I5022 Chronic systolic (congestive) heart failure: Secondary | ICD-10-CM

## 2018-08-06 DIAGNOSIS — I4892 Unspecified atrial flutter: Secondary | ICD-10-CM | POA: Insufficient documentation

## 2018-08-06 DIAGNOSIS — I429 Cardiomyopathy, unspecified: Secondary | ICD-10-CM | POA: Insufficient documentation

## 2018-08-06 DIAGNOSIS — E785 Hyperlipidemia, unspecified: Secondary | ICD-10-CM

## 2018-08-06 NOTE — Patient Instructions (Signed)
No changes to medication at this time.  Follow up 2 months with Dr. Aundra Dubin.  ______________________________________________________________________ Justin Hill Code:  1800  Take all medication as prescribed the day of your appointment. Bring all medications with you to your appointment.  Do the following things EVERYDAY: 1) Weigh yourself in the morning before breakfast. Write it down and keep it in a log. 2) Take your medicines as prescribed 3) Eat low salt foods-Limit salt (sodium) to 2000 mg per day.  4) Stay as active as you can everyday 5) Limit all fluids for the day to less than 2 liters

## 2018-08-10 DIAGNOSIS — B351 Tinea unguium: Secondary | ICD-10-CM | POA: Diagnosis not present

## 2018-08-10 DIAGNOSIS — M2041 Other hammer toe(s) (acquired), right foot: Secondary | ICD-10-CM | POA: Diagnosis not present

## 2018-08-10 DIAGNOSIS — E1142 Type 2 diabetes mellitus with diabetic polyneuropathy: Secondary | ICD-10-CM | POA: Diagnosis not present

## 2018-08-10 DIAGNOSIS — Z89421 Acquired absence of other right toe(s): Secondary | ICD-10-CM | POA: Diagnosis not present

## 2018-08-10 DIAGNOSIS — M2042 Other hammer toe(s) (acquired), left foot: Secondary | ICD-10-CM | POA: Diagnosis not present

## 2018-08-10 DIAGNOSIS — Z794 Long term (current) use of insulin: Secondary | ICD-10-CM | POA: Diagnosis not present

## 2018-08-11 DIAGNOSIS — R310 Gross hematuria: Secondary | ICD-10-CM | POA: Diagnosis not present

## 2018-08-11 DIAGNOSIS — N200 Calculus of kidney: Secondary | ICD-10-CM | POA: Diagnosis not present

## 2018-08-12 ENCOUNTER — Encounter: Payer: Medicare Other | Admitting: *Deleted

## 2018-08-12 ENCOUNTER — Telehealth: Payer: Self-pay | Admitting: Cardiology

## 2018-08-12 NOTE — Telephone Encounter (Signed)
Confirmed remote transmission w/ pt wife.   

## 2018-08-13 ENCOUNTER — Encounter: Payer: Self-pay | Admitting: Cardiology

## 2018-08-13 DIAGNOSIS — I130 Hypertensive heart and chronic kidney disease with heart failure and stage 1 through stage 4 chronic kidney disease, or unspecified chronic kidney disease: Secondary | ICD-10-CM | POA: Diagnosis not present

## 2018-08-13 DIAGNOSIS — N183 Chronic kidney disease, stage 3 (moderate): Secondary | ICD-10-CM | POA: Diagnosis not present

## 2018-08-13 DIAGNOSIS — E860 Dehydration: Secondary | ICD-10-CM | POA: Diagnosis not present

## 2018-08-13 DIAGNOSIS — N179 Acute kidney failure, unspecified: Secondary | ICD-10-CM | POA: Diagnosis not present

## 2018-08-13 DIAGNOSIS — R197 Diarrhea, unspecified: Secondary | ICD-10-CM | POA: Diagnosis not present

## 2018-08-13 DIAGNOSIS — Z87891 Personal history of nicotine dependence: Secondary | ICD-10-CM | POA: Diagnosis not present

## 2018-08-13 DIAGNOSIS — Z79899 Other long term (current) drug therapy: Secondary | ICD-10-CM | POA: Diagnosis not present

## 2018-08-13 DIAGNOSIS — I509 Heart failure, unspecified: Secondary | ICD-10-CM | POA: Diagnosis not present

## 2018-08-13 DIAGNOSIS — Z794 Long term (current) use of insulin: Secondary | ICD-10-CM | POA: Diagnosis not present

## 2018-08-19 ENCOUNTER — Telehealth: Payer: Self-pay

## 2018-08-19 ENCOUNTER — Ambulatory Visit (INDEPENDENT_AMBULATORY_CARE_PROVIDER_SITE_OTHER): Payer: Medicare Other

## 2018-08-19 DIAGNOSIS — Z79899 Other long term (current) drug therapy: Secondary | ICD-10-CM | POA: Diagnosis not present

## 2018-08-19 DIAGNOSIS — Z95810 Presence of automatic (implantable) cardiac defibrillator: Secondary | ICD-10-CM

## 2018-08-19 DIAGNOSIS — N200 Calculus of kidney: Secondary | ICD-10-CM | POA: Diagnosis not present

## 2018-08-19 DIAGNOSIS — R42 Dizziness and giddiness: Secondary | ICD-10-CM | POA: Diagnosis not present

## 2018-08-19 DIAGNOSIS — R197 Diarrhea, unspecified: Secondary | ICD-10-CM | POA: Diagnosis not present

## 2018-08-19 DIAGNOSIS — E1122 Type 2 diabetes mellitus with diabetic chronic kidney disease: Secondary | ICD-10-CM | POA: Diagnosis not present

## 2018-08-19 DIAGNOSIS — I1310 Hypertensive heart and chronic kidney disease without heart failure, with stage 1 through stage 4 chronic kidney disease, or unspecified chronic kidney disease: Secondary | ICD-10-CM | POA: Diagnosis not present

## 2018-08-19 DIAGNOSIS — I959 Hypotension, unspecified: Secondary | ICD-10-CM | POA: Diagnosis not present

## 2018-08-19 DIAGNOSIS — I509 Heart failure, unspecified: Secondary | ICD-10-CM | POA: Diagnosis not present

## 2018-08-19 DIAGNOSIS — N183 Chronic kidney disease, stage 3 (moderate): Secondary | ICD-10-CM | POA: Diagnosis not present

## 2018-08-19 DIAGNOSIS — Z87891 Personal history of nicotine dependence: Secondary | ICD-10-CM | POA: Diagnosis not present

## 2018-08-19 NOTE — Telephone Encounter (Signed)
LMOVM reminding pt to send remote transmission.   

## 2018-08-19 NOTE — Progress Notes (Signed)
Wife called back after PCP office visit today at [facility].  Patient is currently in [facility] ER as advised by  PCP due to low BP of 76/56 during the office visit.  Patient had diarrhea on 9/18 & 9/19 but that subsided on 9/20.  No trouble with breathing.   She said the ER is currently giving him IV fluids.  She reported PCP was unsure what is causing low BP.   Advised will send copy of report to Dr Aundra Dubin for review and will call back if any recommendations.

## 2018-08-19 NOTE — Progress Notes (Signed)
EPIC Encounter for ICM Monitoring  Patient Name: Justin Hill is a 77 y.o. male Date: 08/19/2018 Primary Care Physican: Serita Grammes, MD Primary Cardiologist:[place] Electrophysiologist: [facility] Dry Weight:260lbs Bi-V Pacing: 95.8%  Clinical Status (14-Aug-2018 to 19-Aug-2018)  Appears AT/AF episodes started 9/26 between 4:53 am - 9:55 am.  Treated VT/VF 0 episodes   AT/AF 17 episodes   Atrial Pacing 1.5%   Upper Rate 130 bpm  Time in AT/AF 0.9 hr/day (3.7%) Battery   42 V. Sensing Episodes   5 hours in AT/AF Since Last Session 9/21       Spoke with wife.  Wife's concerns today is patient's low BP, fatigue, difficulty waking him from sleep this morning.  BP was 86/55 with pulse of 92 and 70/? taken at Nephrologist office this morning.  Normally BP is 106/58 or higher.    Most Recent history of hospitalization:  Wife stated patient hospitalized the end of August at [facility] with kidney stones requiring stent. He had [facility] ER on 9/20 and was dx with dehydration.  Discharge instructions included to stop Potassium tablet due to 9/20 Potassium lab results was 5.2 on and Torsemide changed to every 3rd day.      Thoracic impedance normal.  Prescribed: Torsemide 10 mg 1 tablet every other day.  08/19/18 Wife reported Torsemide was changed at time of hospital discharge 9/21, to every 3rd day.   Digoxin stopped 9/13 at HF clinic office visit.   Labs: 08/13/2018 Creatinine 2.3,                  Potassium 5.2, Sodium 137 results given by wife that were drawn at [facility] ER 05/04/2018 Creatinine 1.53, BUN 10, Potassium 5.2, Sodium 138, EGFR 43-50 02/01/2018 Creatinine 1.42, BUN 10, Potassium 4.5, Sodium 137, EGFR 47-55 11/18/2017 Creatinine 1.45, BUN 12, Potassium 4.2, Sodium 139, EGFR 46-53 05/29/2017 Creatinine 1.32, BUN 13, Potassium 4.7, Sodium 134, EGFR 51-60 03/24/2017 Creatinine 1.47, BUN 25, Potassium 5.2, Sodium 133, EGFR 46-54 01/09/2017  Creatinine 1.42, BUN 15, Potassium 4.6, Sodium 136, EGFR 47-55 12/22/2016 Creatinine 1.56, BUN 22, Potassium 5.0, Sodium 138, EGFR 43-50 12/02/2016 Creatinine 1.20, BUN 14, Potassium 4.9, Sodium 138, EGFR >60  Recommendations:  Patient has PCP hospital follow up 9/26 at 2:00 pm  Follow-up plan: [facility] clinic phone appointment on 09/20/2018.   Office appointment scheduled 10/11/2018 with Dr. Aundra Dubin.    Copy of ICM check sent to Dr. Rayann Heman and Dr Aundra Dubin for review and recommendations if needed.   AT/AF   3 month ICM trend: 08/19/2018    1 Year ICM trend:       Rosalene Billings, RN 08/19/2018 1:18 PM

## 2018-08-20 NOTE — Progress Notes (Signed)
Spoke with wife. Patient was released from ER last night (9/26) and feeling much better today.  BP this morning was 120/60.   [facility] ER stopped Tamulosin due to thought it was causing dehydration.  Wife stated they are out shopping for [name] time this morning.  Advised I would update Dr Aundra Dubin regarding patient's improved condition.  Next ICM remote transmission 09/20/2018.

## 2018-08-23 ENCOUNTER — Ambulatory Visit (INDEPENDENT_AMBULATORY_CARE_PROVIDER_SITE_OTHER): Payer: Medicare Other | Admitting: *Deleted

## 2018-08-23 DIAGNOSIS — I428 Other cardiomyopathies: Secondary | ICD-10-CM

## 2018-08-23 NOTE — Progress Notes (Signed)
Lab order faxed to [facility] in [place]. App appt scheduled.

## 2018-08-23 NOTE — Progress Notes (Signed)
Please have him get a BMET this week, get him in for appointment with APP.

## 2018-08-24 ENCOUNTER — Encounter: Payer: Self-pay | Admitting: Endocrinology

## 2018-08-24 ENCOUNTER — Other Ambulatory Visit: Payer: Self-pay | Admitting: Cardiology

## 2018-08-24 DIAGNOSIS — I5022 Chronic systolic (congestive) heart failure: Secondary | ICD-10-CM | POA: Diagnosis not present

## 2018-08-24 NOTE — Progress Notes (Signed)
Remote ICD transmission.   

## 2018-08-25 LAB — BASIC METABOLIC PANEL
BUN / CREAT RATIO: 10 (ref 10–24)
BUN: 15 mg/dL (ref 8–27)
CHLORIDE: 103 mmol/L (ref 96–106)
CO2: 27 mmol/L (ref 20–29)
Calcium: 9.6 mg/dL (ref 8.6–10.2)
Creatinine, Ser: 1.57 mg/dL — ABNORMAL HIGH (ref 0.76–1.27)
GFR calc Af Amer: 49 mL/min/{1.73_m2} — ABNORMAL LOW (ref >59)
GFR calc non Af Amer: 42 mL/min/{1.73_m2} — ABNORMAL LOW (ref >59)
Glucose: 193 mg/dL — ABNORMAL HIGH (ref 65–99)
Potassium: 5 mmol/L (ref 3.5–5.2)
Sodium: 139 mmol/L (ref 134–144)

## 2018-08-26 ENCOUNTER — Other Ambulatory Visit (HOSPITAL_COMMUNITY): Payer: Self-pay | Admitting: Cardiology

## 2018-08-27 LAB — CUP PACEART REMOTE DEVICE CHECK
Battery Remaining Longevity: 42 mo
Battery Voltage: 2.96 V
Brady Statistic AP VP Percent: 3.27 %
Brady Statistic AS VS Percent: 1.43 %
HIGH POWER IMPEDANCE MEASURED VALUE: 78 Ohm
Implantable Lead Location: 753859
Implantable Lead Location: 753860
Implantable Lead Model: 4598
Implantable Lead Model: 5076
Lead Channel Impedance Value: 399 Ohm
Lead Channel Impedance Value: 418 Ohm
Lead Channel Impedance Value: 475 Ohm
Lead Channel Impedance Value: 513 Ohm
Lead Channel Impedance Value: 646 Ohm
Lead Channel Impedance Value: 779 Ohm
Lead Channel Impedance Value: 836 Ohm
Lead Channel Impedance Value: 836 Ohm
Lead Channel Pacing Threshold Amplitude: 0.5 V
Lead Channel Pacing Threshold Amplitude: 3 V
Lead Channel Pacing Threshold Pulse Width: 0.8 ms
Lead Channel Sensing Intrinsic Amplitude: 0.75 mV
Lead Channel Sensing Intrinsic Amplitude: 0.75 mV
Lead Channel Setting Pacing Amplitude: 2.5 V
Lead Channel Setting Pacing Pulse Width: 0.4 ms
Lead Channel Setting Sensing Sensitivity: 0.3 mV
MDC IDC LEAD IMPLANT DT: 20170713
MDC IDC LEAD IMPLANT DT: 20170713
MDC IDC LEAD IMPLANT DT: 20170713
MDC IDC LEAD LOCATION: 753858
MDC IDC MSMT LEADCHNL LV IMPEDANCE VALUE: 361 Ohm
MDC IDC MSMT LEADCHNL LV IMPEDANCE VALUE: 418 Ohm
MDC IDC MSMT LEADCHNL LV IMPEDANCE VALUE: 532 Ohm
MDC IDC MSMT LEADCHNL LV IMPEDANCE VALUE: 665 Ohm
MDC IDC MSMT LEADCHNL RA IMPEDANCE VALUE: 399 Ohm
MDC IDC MSMT LEADCHNL RA PACING THRESHOLD PULSEWIDTH: 0.4 ms
MDC IDC MSMT LEADCHNL RV PACING THRESHOLD AMPLITUDE: 0.5 V
MDC IDC MSMT LEADCHNL RV PACING THRESHOLD PULSEWIDTH: 0.4 ms
MDC IDC MSMT LEADCHNL RV SENSING INTR AMPL: 10.75 mV
MDC IDC MSMT LEADCHNL RV SENSING INTR AMPL: 10.75 mV
MDC IDC PG IMPLANT DT: 20170713
MDC IDC SESS DTM: 20190930192738
MDC IDC SET LEADCHNL LV PACING AMPLITUDE: 4 V
MDC IDC SET LEADCHNL LV PACING PULSEWIDTH: 0.8 ms
MDC IDC SET LEADCHNL RA PACING AMPLITUDE: 2 V
MDC IDC STAT BRADY AP VS PERCENT: 0.1 %
MDC IDC STAT BRADY AS VP PERCENT: 95.21 %
MDC IDC STAT BRADY RA PERCENT PACED: 3.31 %
MDC IDC STAT BRADY RV PERCENT PACED: 5.62 %

## 2018-08-30 DIAGNOSIS — N183 Chronic kidney disease, stage 3 (moderate): Secondary | ICD-10-CM | POA: Diagnosis not present

## 2018-08-30 DIAGNOSIS — I959 Hypotension, unspecified: Secondary | ICD-10-CM | POA: Diagnosis not present

## 2018-08-30 DIAGNOSIS — E1122 Type 2 diabetes mellitus with diabetic chronic kidney disease: Secondary | ICD-10-CM | POA: Diagnosis not present

## 2018-08-30 DIAGNOSIS — Z23 Encounter for immunization: Secondary | ICD-10-CM | POA: Diagnosis not present

## 2018-08-30 DIAGNOSIS — Z794 Long term (current) use of insulin: Secondary | ICD-10-CM | POA: Diagnosis not present

## 2018-09-03 DIAGNOSIS — N401 Enlarged prostate with lower urinary tract symptoms: Secondary | ICD-10-CM | POA: Diagnosis not present

## 2018-09-03 DIAGNOSIS — N200 Calculus of kidney: Secondary | ICD-10-CM | POA: Diagnosis not present

## 2018-09-07 NOTE — Progress Notes (Signed)
Patient ID: Justin Hill, male   DOB: 1941-02-11, 77 y.o.   MRN: [id number] PCP: Dr. Camillia Herter HF Cardiology: Dr. Aundra Dubin Urology: Dr Lajuan Lines   77 y.o. with history CKD, HTN, diabetes, chronic LBBB, and nonischemic cardiomyopathy presents for followup of CHF.  His cardiomyopathy has been known for years.  He had a LHC in 2008 showing mild nonobstructive CAD.     Justin Hill was admitted in 2/16 with atrial flutter degenerating into atrial fibrillation.  He was initially cardioverted, then atrial flutter recurred.  He had atrial flutter ablation by Dr Rayann Heman in 2/16.  He thinks that he has been in NSR since that time, no further palpitations. He is on Xarelto now with no melena or BRBPR.    He is s/p placement of Medtronic CRT-D device.  Echo in 1/18 showed improvement in EF to 40-45%.  Echo in 3/19 showed EF 40-45%.   He developed normal pressure hydrocephalus and had VP shunt placed.  This has helped with his walking and some with his memory.   Hospitalized the end of August at [facility] with kidney stones requiring stent. Hospitalization prolonged due to sepsis and AKI. Wife reports creatinine went up to > 2.8. He was off diuretics and just restarted at home on 07/24/2018.   In September, he was seen at urologist office with BP 88/58. He wasn't dizzy, but felt sleepy. Wife reports some diarrhea earlier in the week. The urologist sent him to his PCP office, who sent him to [facility]. He was admitted and flomax was DC'd. His BP came up to 120s and he was discharged.   He returns today as an add on for hypotension. His wife has been checking his BP at home and it has been running 110-120s typically. He had one reading SBP 90s. He denies any dizziness. Denies SOB, PND, or edema. Sleeps sitting up in recliner. Not very active. Mostly just watches tv. No further diarrhea. Taking all medications other than flomax. Weight up 1 lb on our scale.   Medtronic device interrogation: Optivol volume  trending up since 9/26, just at threshold. Thoracic impedence below threshold. Several short runs of afib, 3 since august lasting 3-5 hours. Active ~1 hour/day.  Labs (5/14): K 4, creatinine 1.4, LFTs normal, LDL 46, HDL 37 Labs (1/15): K 4.4, creatinine 1.46, LDL 74, HDL 35 Labs (4/15); LDL 96, HDL 44, K 5.2, creatinine 1.4 Labs (1/16): K 4.9, creatinine 1.36 Labs (2/16): K 4.5, creatinine 1.56, BNP 412 Labs (9/16): K 4.8, creatinine 1.33, hgb 14.7 Labs (11/16): K 4.3, creatinine 1.37, BNP 52 Labs (1/17): K 4.5 => 5, creatinine 1.35 => 1.17, HCT 47.4 Labs (2/17): K 4.9, creatinine 1.35 Labs (5/17): LDL 48, LFTs normal Labs (9/17): K 5, creatinine 1.21 Labs (11/17): HCT 41.4, digoxin 0.3, K 4.9, creatinine 1.27 Labs (5/18): K 5.2, creatinine 1.47 Labs (7/18): digoxin 0.3 Labs (9/18): LDL 82, HDL 49, K 4.5, creatinine 1.34 Labs (3/19): digoxin 0.4, K 4.5, creatinine 1.42 Labs (6/19): K 5.2  PMH: 1. Type II diabetes with peripheral neuropathy.  2. CKD 3. Hyperlipidemia 4. HTN: ACEI cough.  5. Nonischemic cardiomyopathy: This was diagnosed prior to 2008.  In 2008, patient had LHC with only mild nonobstructive coronary disease.  Echo in 1/14 showed EF 25-30% with normal RV.  Patient has not tolerated ACEI due to hyperkalemia and AKI. ICD was discussed (Dr Lovena Le) and decided against given NYHA class I symptoms.  Echo (7/15) with EF 25-30%, septal-lateral dyssynchrony, mild LV dilation.  Echo (6/16) with EF 25-30%, mild LVH, septal-lateral dyssynchrony, normal RV size and systolic function. CPX (12/16) with peak VO2 14.5 (69% predicted), VE/VCO2 34, RER 1.19 => mild functional impairment from heart failure, restrictive lung function likely due to body habitus.  - LHC/RHC (6/17): 30% mid LAD stenosis; mean RA 5, PA 33/10, PCWP mean 16, CI 1.95.  - Medtronic CRT-D placed 7/17.  - Echo (1/18): EF 40-45%, mild LVH.  - Echo (3/19): EF 40-45%, moderate LVH, normal RV size/function.  6. Chronic  LBBB 7. Obesity 8. Atrial flutter and atrial fibrillation: S/p atrial flutter ablation in 2/16 (Allred).  9. Normal pressure hydrocephalus: s/p VP shunt.  10. OSA: Uses CPAP.   SH: Lives in [place], married, prior smoker, no ETOH.  1 child.   FH: Grandmother with CHF.  Mother with CVA.   Review of systems complete and found to be negative unless listed in HPI.   Current Outpatient Medications  Medication Sig Dispense Refill  . carvedilol (COREG) 25 MG tablet Take 25 mg by mouth 2 (two) times daily with a meal.    . ENTRESTO 97-103 MG TAKE 1 TABLET BY MOUTH TWICE DAILY 180 tablet 1  . fenofibrate 54 MG tablet Take 1 tablet (54 mg total) by mouth daily. 90 tablet 3  . loratadine (CLARITIN) 10 MG tablet Take 1 tablet by mouth 2 (two) times daily.     . simvastatin (ZOCOR) 40 MG tablet Take 40 mg by mouth at bedtime.      . torsemide (DEMADEX) 10 MG tablet Take 1 tablet (10 mg total) by mouth every other day. 45 tablet 3  . XARELTO 20 MG TABS tablet TAKE 1 TABLET BY MOUTH ONCE DAILY WITH  SUPPER 90 tablet 3  . Ascorbic Acid (VITAMIN C) 1000 MG tablet Take 1 tablet by mouth 2 (two) times daily.     . insulin NPH-regular Human (NOVOLIN 70/30) (70-30) 100 UNIT/ML injection 75 units with breakfast, and 60 units with supper, and syringes 2/day 100 mL 11  . Lactobacillus (ACIDOPHILUS PO) Take by mouth daily.    . Multiple Vitamin (MULTIVITAMIN) tablet Take 1 tablet by mouth daily.      Marland Kitchen omeprazole (PRILOSEC) 40 MG capsule Take 40 mg by mouth daily. OTC    . Potassium Citrate (UROCIT-K 15) 15 MEQ (1620 MG) TBCR Take 1 tablet by mouth daily.    . tamsulosin (FLOMAX) 0.4 MG CAPS capsule Take 0.4 mg by mouth daily. 08/20/18 Reported stopped by [facility] ER due to causing dehydration     No current facility-administered medications for this encounter.     BP (!) 146/82   Pulse 80   Wt 115.3 kg (254 lb 3.2 oz)   SpO2 95%   BMI 34.48 kg/m   Wt Readings from Last 3 Encounters:  09/08/18 115.3 kg  (254 lb 3.2 oz)  08/06/18 114.8 kg (253 lb)  07/29/18 117.2 kg (258 lb 6.4 oz)   Lab Results  Component Value Date   CREATININE 1.57 (H) 08/24/2018   CREATININE 1.53 (H) 05/04/2018   CREATININE 1.42 (H) 02/01/2018    General: Obese. No resp difficulty. HEENT: Normal Neck: Supple. JVP 8-9. Carotids 2+ bilat; no bruits. No thyromegaly or nodule noted. Cor: PMI nondisplaced. RRR, No M/G/R noted Lungs: clear, diminished throughout Abdomen: obese, soft, non-tender, non-distended, no HSM. No bruits or masses. +BS  Extremities: No cyanosis, clubbing, or rash. R and LLE 1+ ankle edema  Neuro: Alert & orientedx3, cranial nerves grossly intact. moves all  4 extremities w/o difficulty. Affect pleasant   Assessment/Plan: 1. Cardiomyopathy: Nonischemic.  Low EF x years. Echo 6/16 showed stable EF 25-30% with septal-lateral dyssynchrony.  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  Interestingly, it also appears that he has a long-standing LBBB.  A chronic LBBB itself can potentially cause a cardiomyopathy and may be the source of his low EF. Coronary angiography in 6/17 with nonobstructive disease.  He has Medtronic CRT-D device placement.  - Echo in 3/19 with EF up to 40-45%.   - NYHA II. Volume status mildly elevated on exam and on optivol - Take torsemide 20 mg x 2 days, then back to 10 mg every other day. Hold torsemide on days with diarrhea. - Continue Coreg 25 mg BID. - Continue Entresto 97/103 bid.   - No longer on digoxin with EF 40-45%.  - Not on spiro with hyperkalemia. 2. Hyperlipidemia: Patient is on simvastatin. 04/2018 LDL 76 3. CKD:  - Creatinine at baseline 1.57 on 10/1. 4. Atrial flutter: s/p ablation 5. Atrial fibrillation: Patient had afib noted as well as flutter.   - Regular on exam today, but has a few episodes of afib on ICD interrogation.  - Continue xarelto long term. Denies bleeding.  6. Normal pressure  hydrocephalus: Has VP shunt.   7. OSA: Continue to use CPAP. No change.  8. Hypotension - Resolved. Seems to be secondary to diarrhea.  - BP elevated today in clinic, but SBP 110-120s at home. Wife asked if he should restart flomax and I told her that I think it is fine, but she should double check with PCP who prescribed it.  He has follow up with Dr Aundra Dubin in 4-5 weeks and would like to keep appointment.   Georgiana Shore NP-C  09/08/2018  Greater than 50% of the 25 minute visit was spent in counseling/coordination of care regarding disease state education, salt/fluid restriction, sliding scale diuretics, and medication compliance.

## 2018-09-08 ENCOUNTER — Encounter (HOSPITAL_COMMUNITY): Payer: Self-pay

## 2018-09-08 ENCOUNTER — Ambulatory Visit (HOSPITAL_COMMUNITY)
Admission: RE | Admit: 2018-09-08 | Discharge: 2018-09-08 | Disposition: A | Discharge status: Home / Self Care | Payer: Medicare Other | Source: Ambulatory Visit | Attending: Internal Medicine | Admitting: Internal Medicine

## 2018-09-08 VITALS — BP 146/82 | HR 80 | Wt 254.2 lb

## 2018-09-08 DIAGNOSIS — I5022 Chronic systolic (congestive) heart failure: Secondary | ICD-10-CM | POA: Diagnosis not present

## 2018-09-08 DIAGNOSIS — I447 Left bundle-branch block, unspecified: Secondary | ICD-10-CM | POA: Diagnosis not present

## 2018-09-08 DIAGNOSIS — I4891 Unspecified atrial fibrillation: Secondary | ICD-10-CM | POA: Diagnosis not present

## 2018-09-08 DIAGNOSIS — G4733 Obstructive sleep apnea (adult) (pediatric): Secondary | ICD-10-CM | POA: Insufficient documentation

## 2018-09-08 DIAGNOSIS — Z794 Long term (current) use of insulin: Secondary | ICD-10-CM | POA: Diagnosis not present

## 2018-09-08 DIAGNOSIS — Z8249 Family history of ischemic heart disease and other diseases of the circulatory system: Secondary | ICD-10-CM | POA: Diagnosis not present

## 2018-09-08 DIAGNOSIS — E669 Obesity, unspecified: Secondary | ICD-10-CM | POA: Insufficient documentation

## 2018-09-08 DIAGNOSIS — I130 Hypertensive heart and chronic kidney disease with heart failure and stage 1 through stage 4 chronic kidney disease, or unspecified chronic kidney disease: Secondary | ICD-10-CM | POA: Insufficient documentation

## 2018-09-08 DIAGNOSIS — Z79899 Other long term (current) drug therapy: Secondary | ICD-10-CM | POA: Insufficient documentation

## 2018-09-08 DIAGNOSIS — Z87891 Personal history of nicotine dependence: Secondary | ICD-10-CM | POA: Diagnosis not present

## 2018-09-08 DIAGNOSIS — Z9989 Dependence on other enabling machines and devices: Secondary | ICD-10-CM | POA: Diagnosis not present

## 2018-09-08 DIAGNOSIS — E1142 Type 2 diabetes mellitus with diabetic polyneuropathy: Secondary | ICD-10-CM | POA: Insufficient documentation

## 2018-09-08 DIAGNOSIS — I509 Heart failure, unspecified: Secondary | ICD-10-CM | POA: Insufficient documentation

## 2018-09-08 DIAGNOSIS — N189 Chronic kidney disease, unspecified: Secondary | ICD-10-CM | POA: Insufficient documentation

## 2018-09-08 DIAGNOSIS — Z982 Presence of cerebrospinal fluid drainage device: Secondary | ICD-10-CM | POA: Diagnosis not present

## 2018-09-08 DIAGNOSIS — E785 Hyperlipidemia, unspecified: Secondary | ICD-10-CM | POA: Diagnosis not present

## 2018-09-08 DIAGNOSIS — I4892 Unspecified atrial flutter: Secondary | ICD-10-CM | POA: Diagnosis not present

## 2018-09-08 DIAGNOSIS — I428 Other cardiomyopathies: Secondary | ICD-10-CM | POA: Insufficient documentation

## 2018-09-08 DIAGNOSIS — I480 Paroxysmal atrial fibrillation: Secondary | ICD-10-CM | POA: Diagnosis not present

## 2018-09-08 DIAGNOSIS — Z95810 Presence of automatic (implantable) cardiac defibrillator: Secondary | ICD-10-CM | POA: Diagnosis not present

## 2018-09-08 DIAGNOSIS — E1122 Type 2 diabetes mellitus with diabetic chronic kidney disease: Secondary | ICD-10-CM | POA: Insufficient documentation

## 2018-09-08 DIAGNOSIS — Z7901 Long term (current) use of anticoagulants: Secondary | ICD-10-CM | POA: Diagnosis not present

## 2018-09-08 DIAGNOSIS — Z6834 Body mass index (BMI) 34.0-34.9, adult: Secondary | ICD-10-CM | POA: Diagnosis not present

## 2018-09-08 DIAGNOSIS — G912 (Idiopathic) normal pressure hydrocephalus: Secondary | ICD-10-CM | POA: Diagnosis not present

## 2018-09-08 DIAGNOSIS — I959 Hypotension, unspecified: Secondary | ICD-10-CM | POA: Diagnosis present

## 2018-09-08 NOTE — Patient Instructions (Signed)
Today you have been seen at the Heart failure clinic at [facility]   Medication changes:  Take Torsemide 20mg  for 2 days then resume 10mg  every other day If you have diarrhea hold your Torsemide Call your PCP Dr. Camillia Herter about your flowmax  Please keep your follow up appointment with Dr. Aundra Dubin  Do the following things EVERYDAY: 1) Weigh yourself in the morning before breakfast. Write it down and keep it in a log. 2) Take your medicines as prescribed 3) Eat low salt foods-Limit salt (sodium) to 2000 mg per day.  4) Stay as active as you can everyday 5) Limit all fluids for the day to less than 2 liters

## 2018-09-20 ENCOUNTER — Ambulatory Visit (INDEPENDENT_AMBULATORY_CARE_PROVIDER_SITE_OTHER): Payer: Medicare Other

## 2018-09-20 DIAGNOSIS — Z95810 Presence of automatic (implantable) cardiac defibrillator: Secondary | ICD-10-CM

## 2018-09-20 DIAGNOSIS — I5022 Chronic systolic (congestive) heart failure: Secondary | ICD-10-CM | POA: Diagnosis not present

## 2018-09-20 NOTE — Progress Notes (Signed)
EPIC Encounter for ICM Monitoring  Patient Name: Justin Hill is a 77 y.o. male Date: 09/20/2018 Primary Care Physican: Serita Grammes, MD Primary Cardiologist:[place] Electrophysiologist: Allred Dry Weight:260lbs Bi-V Pacing: 95.9%      Spoke with wife.  Heart Failure questions reviewed, patients feet were slightly swollen yesterday but better today.     Thoracic impedance abnormal suggesting fluid accumulation starting 08/19/2018.   Prescribed: Torsemide 10 mg 1 tablet every other day.       Labs: 08/24/2018 Creatinine 1.57, BUN 15, Potassium 5.0, Sodium 139, eGFR 42-49 08/13/2018 Creatinine 2.3,   Potassium 5.2, Sodium 137 results given by wife that were drawn at [facility] ER 05/04/2018 Creatinine 1.53, BUN 10, Potassium 5.2, Sodium 138, EGFR 43-50 02/01/2018 Creatinine 1.42, BUN 10, Potassium 4.5, Sodium 137, EGFR 47-55 11/18/2017 Creatinine 1.45, BUN 12, Potassium 4.2, Sodium 139, EGFR 46-53 05/29/2017 Creatinine 1.32, BUN 13, Potassium 4.7, Sodium 134, EGFR 51-60 03/24/2017 Creatinine 1.47, BUN 25, Potassium 5.2, Sodium 133, EGFR 46-54 01/09/2017 Creatinine 1.42, BUN 15, Potassium 4.6, Sodium 136, EGFR 47-55 12/22/2016 Creatinine 1.56, BUN 22, Potassium 5.0, Sodium 138, EGFR 43-50 12/02/2016 Creatinine 1.20, BUN 14, Potassium 4.9, Sodium 138, EGFR >60  Recommendations: No changes.    Encouraged to call for fluid symptoms.  Follow-up plan: ICM clinic phone appointment on 09/27/2018 to recheck fluid levels.   Office appointment scheduled 10/11/2018 with Dr. Aundra Dubin.    Copy of ICM check sent to Dr. Rayann Heman and Dr Aundra Dubin for review and recommendation if needed.   3 month ICM trend: 09/20/2018    1 Year ICM trend:       Rosalene Billings, RN 09/20/2018 12:30 PM

## 2018-09-21 ENCOUNTER — Other Ambulatory Visit: Payer: Self-pay | Admitting: Internal Medicine

## 2018-09-26 NOTE — Progress Notes (Signed)
Increase torsemide to 10 mg daily with BMET 1 week.

## 2018-09-27 ENCOUNTER — Ambulatory Visit (INDEPENDENT_AMBULATORY_CARE_PROVIDER_SITE_OTHER): Payer: Medicare Other

## 2018-09-27 ENCOUNTER — Telehealth: Payer: Self-pay

## 2018-09-27 DIAGNOSIS — I5022 Chronic systolic (congestive) heart failure: Secondary | ICD-10-CM

## 2018-09-27 DIAGNOSIS — Z95810 Presence of automatic (implantable) cardiac defibrillator: Secondary | ICD-10-CM

## 2018-09-27 MED ORDER — TORSEMIDE 10 MG PO TABS: 10.0000 mg | ORAL_TABLET | Freq: Every day | ORAL | 3 refills | Status: DC

## 2018-09-27 NOTE — Progress Notes (Signed)
EPIC Encounter for ICM Monitoring  Patient Name: Justin Hill is a 77 y.o. male Date: 09/27/2018 Primary Care Physican: Serita Grammes, MD Primary Cardiologist:[place] Electrophysiologist: Allred Dry Weight:260lbs Bi-V Pacing: 93.5%        Clinical Status (26-Sep-2018 to 27-Sep-2018)  Treated VT/VF 0 episodes  AT/AF 4 episodes  Time in AT/AF (3.6%)    Longest AT/AF 86 seconds      Spoke with wife today.  Advised Dr Aundra Dubin ordered to increase Torsemide 10 mg daily and BMET in 1 week.  She requested labs be drawn at [facility] in [place] and fax number confirmed with [facility]. Advised will fax the order today. Swelling in feet have improved.   Thoracic impedance remains abnormal suggesting fluid accumulation. Torsemide increased today, 09/27/2018.   Prescribed: Torsemide 10 mg 1 tablet daily (changed 09/27/2018).   Labs: BMET to be drawn 10/04/2018 08/24/2018 Creatinine 1.57, BUN 15, Potassium 5.0, Sodium 139, eGFR 42-49 08/13/2018 Creatinine 2.3, Potassium 5.2, Sodium 137 results given by wife that were drawn at [facility] ER 05/04/2018 Creatinine 1.53, BUN 10, Potassium 5.2, Sodium 138, EGFR 43-50 02/01/2018 Creatinine 1.42, BUN 10, Potassium 4.5, Sodium 137, EGFR 47-55  Recommendations: Advised Dr Aundra Dubin increased Torsemide to 10 mg daily and BMET 10/04/2018 (to be drawn at [facility] in [place])  Follow-up plan: ICM clinic phone appointment on 10/05/2018 to recheck fluid levels.   Office appointment scheduled 10/11/2018 with Dr. Aundra Dubin.    Copy of ICM check sent to Dr. Rayann Heman.   3 month ICM trend: 09/27/2018    AT/AF   1 Year ICM trend:       Rosalene Billings, RN 09/27/2018 10:34 AM

## 2018-09-27 NOTE — Telephone Encounter (Signed)
Spoke with [facility] representative.  Advised need to fax lab orders to [facility] in [place]. Fax number is [phone].  Faxed BMET orders to be drawn 10/04/2018

## 2018-09-27 NOTE — Progress Notes (Signed)
Wife returned call.  Advised Dr Aundra Dubin has ordered to take Torsemide 10 mg daily and BMET in 1 week. She has enough Torsemide on hand and does not need refill at this time. She requested labs be completed at [facility] in [place]. Advised will fax the order today.

## 2018-09-27 NOTE — Addendum Note (Signed)
Addended by: Rosalene Billings on: 09/27/2018 10:33 AM   Modules accepted: Orders

## 2018-09-30 ENCOUNTER — Encounter: Payer: Self-pay | Admitting: Endocrinology

## 2018-09-30 ENCOUNTER — Ambulatory Visit (INDEPENDENT_AMBULATORY_CARE_PROVIDER_SITE_OTHER): Payer: Medicare Other | Admitting: Endocrinology

## 2018-09-30 VITALS — BP 132/70 | HR 94 | Ht 72.0 in | Wt 258.4 lb

## 2018-09-30 DIAGNOSIS — E1122 Type 2 diabetes mellitus with diabetic chronic kidney disease: Secondary | ICD-10-CM

## 2018-09-30 DIAGNOSIS — N183 Chronic kidney disease, stage 3 unspecified: Secondary | ICD-10-CM

## 2018-09-30 DIAGNOSIS — Z794 Long term (current) use of insulin: Secondary | ICD-10-CM

## 2018-09-30 LAB — POCT GLYCOSYLATED HEMOGLOBIN (HGB A1C): HEMOGLOBIN A1C: 8.1 % — AB (ref 4.0–5.6)

## 2018-09-30 MED ORDER — INSULIN NPH ISOPHANE & REGULAR (70-30) 100 UNIT/ML ~~LOC~~ SUSP: SUBCUTANEOUS | 11 refills | Status: DC

## 2018-09-30 NOTE — Progress Notes (Signed)
Subjective:    Patient ID: Justin Hill, male    DOB: [birth year], 77 y.o.   MRN: [id number]  HPI Pt returns for f/u of diabetes mellitus: DM type: Insulin-requiring type 2. Dx'ed: 6144 Complications: CHF, toe ulcer, partial toe amputation, polyneuropathy, and renal insufficiency.  Therapy: insulin since 2012.  DKA: never.   Severe hypoglycemia: never.    Pancreatitis: never.    Other: he takes human insulin, due to cost; in October of 2014, he was changed from multiple daily injections to BID, due to persistently high a1c.  wife gives insulin, due to his memory loss.   Interval history: Since back home, he takes 75 units BID.  wife brings a record of her cbg's which I have reviewed today, checked fasting only.  It is persistently in the 200's.  He takes 80 units with breakfast, and 65 units with supper. Past Medical History:  Diagnosis Date  . Altered mental status   . Cardiomyopathy   . CHF (congestive heart failure) ([place])   . Chronic kidney disease (CKD), stage III (moderate) (H[facility])   . Communicating hydrocephalus ([place])   . Diabetes mellitus    type 2  . Diverticulosis   . History of kidney stones   . Hypercholesterolemia   . Hyperglycemia   . Hypertension   . Paroxysmal atrial fibrillation (H[facility])   . Personal history of kidney stones   . Pneumonia   . Sleep apnea    uses cpap    Past Surgical History:  Procedure Laterality Date  . ATRIAL FLUTTER ABLATION N/A 01/19/2015   Procedure: ATRIAL FLUTTER ABLATION;  Surgeon: [name], MD;  Location: [facility] CATH LAB;  Service: Cardiovascular;  Laterality: N/A;  . CARDIAC CATHETERIZATION N/A 05/02/2016   Procedure: Right/Left Heart Cath and Coronary Angiography;  Surgeon: Larey Dresser, MD;  Location: [facility] CV LAB;  Service: Cardiovascular;  Laterality: N/A;  . COLONOSCOPY N/A 12/29/2013   Procedure: COLONOSCOPY;  Surgeon: Lafayette Dragon, MD;  Location: [facility] ENDOSCOPY;  Service: Endoscopy;  Laterality: N/A;  . EP IMPLANTABLE  DEVICE N/A 06/05/2016   MDT Hillery Aldo XT CRTD implanted by Dr Rayann Heman   . EYE SURGERY     cataract surgery (not sure which eye)  . [place]   left. Muscle reattachment  . TOE SURGERY Right 12/2012   2nd toe  . TONSILLECTOMY    . VENTRICULOPERITONEAL SHUNT Right 11/20/2017   Procedure: SHUNT PLACEMENT RIGHT;  Surgeon: Newman Pies, MD;  Location: [facility];  Service: Neurosurgery;  Laterality: Right;  SHUNT PLACEMENT RIGHT    Social History   Socioeconomic History  . Marital status: Married    Spouse name: Rosemarie Beath  . Number of children: 1  . Years of education: 87  . Highest education level: Not on file  Occupational History  . Occupation: retired  Scientific laboratory technician  . Financial resource strain: Not on file  . Food insecurity:    Worry: Not on file    Inability: Not on file  . Transportation needs:    Medical: Not on file    Non-medical: Not on file  Tobacco Use  . Smoking status: Former Smoker    Types: Cigarettes  . Smokeless tobacco: Never Used  Substance and Sexual Activity  . Alcohol use: No  . Drug use: No  . Sexual activity: Not on file  Lifestyle  . Physical activity:    Days per week: Not on file    Minutes per session: Not  on file  . Stress: Not on file  Relationships  . Social connections:    Talks on phone: Not on file    Gets together: Not on file    Attends religious service: Not on file    Active member of club or organization: Not on file    Attends meetings of clubs or organizations: Not on file    Relationship status: Not on file  . Intimate partner violence:    Fear of current or ex partner: Not on file    Emotionally abused: Not on file    Physically abused: Not on file    Forced sexual activity: Not on file  Other Topics Concern  . Not on file  Social History Narrative   Regular exercise-no   Rare caffeine use     Current Outpatient Medications on File Prior to Visit  Medication Sig Dispense Refill  . Ascorbic Acid (VITAMIN  C) 1000 MG tablet Take 1 tablet by mouth 2 (two) times daily.     . carvedilol (COREG) 25 MG tablet Take 25 mg by mouth 2 (two) times daily with a meal.    . ENTRESTO 97-103 MG TAKE 1 TABLET BY MOUTH TWICE DAILY 180 tablet 1  . fenofibrate 54 MG tablet Take 1 tablet (54 mg total) by mouth daily. 90 tablet 3  . loratadine (CLARITIN) 10 MG tablet Take 1 tablet by mouth 2 (two) times daily.     . Multiple Vitamin (MULTIVITAMIN) tablet Take 1 tablet by mouth daily.      Marland Kitchen omeprazole (PRILOSEC) 40 MG capsule Take 40 mg by mouth daily. OTC    . simvastatin (ZOCOR) 40 MG tablet Take 40 mg by mouth at bedtime.      . tamsulosin (FLOMAX) 0.4 MG CAPS capsule Take 0.4 mg by mouth daily. 08/20/18 Reported stopped by [facility] ER due to causing dehydration    . torsemide (DEMADEX) 10 MG tablet Take 1 tablet (10 mg total) by mouth daily. 90 tablet 3  . XARELTO 20 MG TABS tablet TAKE 1 TABLET BY MOUTH ONCE DAILY WITH  SUPPER 90 tablet 3   No current facility-administered medications on file prior to visit.     Allergies  Allergen Reactions  . Penicillins Anaphylaxis and Other (See Comments)    SYNCOPE PATIENT HAS HAD A PCN REACTION WITH IMMEDIATE RASH, FACIAL/TONGUE/THROAT SWELLING, SOB, OR LIGHTHEADEDNESS WITH HYPOTENSION:  #  #  #  YES  #  #  #   Has patient had a PCN reaction causing severe rash involving mucus membranes or skin necrosis: no PATIENT HAS HAD A PCN REACTION THAT REQUIRED HOSPITALIZATION:  #  #  #  YES  #  #  #  Has patient had a PCN reaction occurring within the last 10 years: no    . Aspirin Other (See Comments)    Burps, burning, stomach pains, etc  . Lisinopril Cough    Severe coughing    Family History  Problem Relation Age of Onset  . Heart failure Maternal Grandmother   . Diabetes type II Sister 17  . Cancer Sister   . Hyperlipidemia Other        Parent  . Stroke Other        Parent  . Hypertension Other        Parent  . Diabetes Other        Parent  . Colon cancer  Neg Hx   . Heart attack Neg Hx  BP 132/70 (BP Location: Right Arm, Patient Position: Sitting, Cuff Size: Large)   Pulse 94   Ht 6' (1.829 m)   Wt 258 lb 6.4 oz (117.2 kg)   SpO2 93%   BMI 35.05 kg/m    Review of Systems He denies hypoglycemia    Objective:   Physical Exam VITAL SIGNS:  See vs page. GENERAL: no distress.  Pulses: foot pulses are intact bilaterally.   MSK: hammer toes on the left foot. right 2nd toe is surgically absent.  CV: 1+ bilat edema of the legs, and bilat vv's Skin:  no ulcer on the feet or ankles, but the skin is very dry.  normal temp on the feet and ankles. There is bilateral onychomycosis.    Neuro: sensation is intact to touch on the feet and ankles, but decreased from normal.     Lab Results  Component Value Date   CREATININE 1.57 (H) 08/24/2018   BUN 15 08/24/2018   NA 139 08/24/2018   K 5.0 08/24/2018   CL 103 08/24/2018   CO2 27 08/24/2018   A1c=8.1%     Assessment & Plan:  Insulin-requiring type 2 DM, with polyneuropathy Renal insuff: in this context, he needs most of his insulin with breakfast. Memory loss: he is not a candidate for aggressive glycemic control  Patient Instructions  Please reduce the insulin to 80 units with breakfast, and 70 units with supper.    On this type of insulin schedule, you should eat meals on a regular schedule.  If a meal is missed or significantly.  delayed, your blood sugar could go low.   check your blood sugar twice a day.  vary the time of day when you check, between before the 3 meals, and at bedtime.  also check if you have symptoms of your blood sugar being too high or too low.  please keep a record of the readings and bring it to your next appointment here.  please call us sooner if your blood sugar goes below 70, or if you have a lot of readings over 200.   If you have any blood sugar under 80, please write on the paper why you think that was.   Please come back for a follow-up appointment in  2 months.

## 2018-09-30 NOTE — Patient Instructions (Addendum)
Please reduce the insulin to 80 units with breakfast, and 70 units with supper.    On this type of insulin schedule, you should eat meals on a regular schedule.  If a meal is missed or significantly.  delayed, your blood sugar could go low.   check your blood sugar twice a day.  vary the time of day when you check, between before the 3 meals, and at bedtime.  also check if you have symptoms of your blood sugar being too high or too low.  please keep a record of the readings and bring it to your next appointment here.  please call us sooner if your blood sugar goes below 70, or if you have a lot of readings over 200.   If you have any blood sugar under 80, please write on the paper why you think that was.   Please come back for a follow-up appointment in 2 months.

## 2018-10-03 ENCOUNTER — Other Ambulatory Visit: Payer: Self-pay | Admitting: Internal Medicine

## 2018-10-04 DIAGNOSIS — Z125 Encounter for screening for malignant neoplasm of prostate: Secondary | ICD-10-CM | POA: Diagnosis not present

## 2018-10-04 DIAGNOSIS — I428 Other cardiomyopathies: Secondary | ICD-10-CM | POA: Diagnosis not present

## 2018-10-04 DIAGNOSIS — N183 Chronic kidney disease, stage 3 (moderate): Secondary | ICD-10-CM | POA: Diagnosis not present

## 2018-10-04 DIAGNOSIS — E1122 Type 2 diabetes mellitus with diabetic chronic kidney disease: Secondary | ICD-10-CM | POA: Diagnosis not present

## 2018-10-04 DIAGNOSIS — N401 Enlarged prostate with lower urinary tract symptoms: Secondary | ICD-10-CM | POA: Diagnosis not present

## 2018-10-04 DIAGNOSIS — I5022 Chronic systolic (congestive) heart failure: Secondary | ICD-10-CM | POA: Diagnosis not present

## 2018-10-04 DIAGNOSIS — Z794 Long term (current) use of insulin: Secondary | ICD-10-CM | POA: Diagnosis not present

## 2018-10-05 ENCOUNTER — Ambulatory Visit (INDEPENDENT_AMBULATORY_CARE_PROVIDER_SITE_OTHER): Payer: Medicare Other

## 2018-10-05 ENCOUNTER — Telehealth: Payer: Self-pay

## 2018-10-05 DIAGNOSIS — Z95810 Presence of automatic (implantable) cardiac defibrillator: Secondary | ICD-10-CM

## 2018-10-05 DIAGNOSIS — I5022 Chronic systolic (congestive) heart failure: Secondary | ICD-10-CM

## 2018-10-05 NOTE — Telephone Encounter (Signed)
Call to [facility] at [phone] to follow up on BMET results collected 10/04/2018.  Advised results have been sent to physician office.

## 2018-10-05 NOTE — Progress Notes (Signed)
EPIC Encounter for ICM Monitoring  Patient Name: Justin Hill is a 77 y.o. male Date: 10/05/2018 Primary Care Physican: Serita Grammes, MD Primary Cardiologist:[facility] Electrophysiologist: Allred Bi-V Pacing: 93.2%  Last Weight: 260lbs  Clinical Status (27-Sep-2018 to 05-Oct-2018)  Treated VT/VF 0 episodes  AT/AF 63 episodes   Time in AT/AF 0.8 hr/day (3.1%)  Longest AT/AF 59 minutes  Observations (2) (27-Sep-2018 to 05-Oct-2018)  Night heart rate over 85 bpm for 7 days.  Patient Activity less than 1 hr/day for 1 weeks.       Spoke with wife. Heart Failure questions reviewed, pt asymptomatic.  Swelling in feet have resolved.  He visited PCP on 10/04/2018 and she did not see any fluid symptoms.  Wife reported BMET was drawn at [facility] 10/04/2018 and waiting on results.    Thoracic impedance returned normal after long term Torsemide dosage increased to 10 mg daily by Dr Aundra Dubin.   Prescribed: Torsemide 10 mg 1 tablet daily (changed 09/27/2018).  Labs:  08/24/2018 Creatinine 1.57, BUN 15, Potassium 5.0, Sodium 139, eGFR 42-49 08/13/2018 Creatinine 2.3, Potassium 5.2, Sodium 137 results given by wife that were drawn at [facility] ER 05/04/2018 Creatinine 1.53, BUN 10, Potassium 5.2, Sodium 138, EGFR 43-50 02/01/2018 Creatinine 1.42, BUN 10, Potassium 4.5, Sodium 137, EGFR 47-55  Recommendations: No changes.   Encouraged to call for fluid symptoms.  Follow-up plan: ICM clinic phone appointment on 11/04/2018.      Copy of ICM check sent to Dr. Rayann Heman.   3 month ICM trend: 10/04/2018       1 Year ICM trend:            Rosalene Billings, RN 10/05/2018 2:24 PM

## 2018-10-07 DIAGNOSIS — N529 Male erectile dysfunction, unspecified: Secondary | ICD-10-CM | POA: Diagnosis not present

## 2018-10-07 DIAGNOSIS — N289 Disorder of kidney and ureter, unspecified: Secondary | ICD-10-CM | POA: Diagnosis not present

## 2018-10-07 DIAGNOSIS — N401 Enlarged prostate with lower urinary tract symptoms: Secondary | ICD-10-CM | POA: Diagnosis not present

## 2018-10-07 DIAGNOSIS — N200 Calculus of kidney: Secondary | ICD-10-CM | POA: Diagnosis not present

## 2018-10-08 DIAGNOSIS — G912 (Idiopathic) normal pressure hydrocephalus: Secondary | ICD-10-CM | POA: Diagnosis not present

## 2018-10-08 DIAGNOSIS — Z6836 Body mass index (BMI) 36.0-36.9, adult: Secondary | ICD-10-CM | POA: Diagnosis not present

## 2018-10-11 ENCOUNTER — Encounter (HOSPITAL_COMMUNITY): Payer: Self-pay | Admitting: Cardiology

## 2018-10-11 ENCOUNTER — Ambulatory Visit (HOSPITAL_COMMUNITY)
Admission: RE | Admit: 2018-10-11 | Discharge: 2018-10-11 | Disposition: A | Discharge status: Home / Self Care | Payer: Medicare Other | Source: Ambulatory Visit | Attending: Cardiology | Admitting: Cardiology

## 2018-10-11 VITALS — BP 110/62 | HR 87 | Wt 260.0 lb

## 2018-10-11 DIAGNOSIS — Z8249 Family history of ischemic heart disease and other diseases of the circulatory system: Secondary | ICD-10-CM | POA: Diagnosis not present

## 2018-10-11 DIAGNOSIS — Z87891 Personal history of nicotine dependence: Secondary | ICD-10-CM | POA: Diagnosis not present

## 2018-10-11 DIAGNOSIS — E1122 Type 2 diabetes mellitus with diabetic chronic kidney disease: Secondary | ICD-10-CM | POA: Diagnosis not present

## 2018-10-11 DIAGNOSIS — I2510 Atherosclerotic heart disease of native coronary artery without angina pectoris: Secondary | ICD-10-CM | POA: Insufficient documentation

## 2018-10-11 DIAGNOSIS — I4892 Unspecified atrial flutter: Secondary | ICD-10-CM | POA: Insufficient documentation

## 2018-10-11 DIAGNOSIS — G912 (Idiopathic) normal pressure hydrocephalus: Secondary | ICD-10-CM | POA: Insufficient documentation

## 2018-10-11 DIAGNOSIS — I4891 Unspecified atrial fibrillation: Secondary | ICD-10-CM | POA: Insufficient documentation

## 2018-10-11 DIAGNOSIS — Z79899 Other long term (current) drug therapy: Secondary | ICD-10-CM | POA: Diagnosis not present

## 2018-10-11 DIAGNOSIS — N183 Chronic kidney disease, stage 3 (moderate): Secondary | ICD-10-CM | POA: Insufficient documentation

## 2018-10-11 DIAGNOSIS — E1142 Type 2 diabetes mellitus with diabetic polyneuropathy: Secondary | ICD-10-CM | POA: Diagnosis not present

## 2018-10-11 DIAGNOSIS — G4733 Obstructive sleep apnea (adult) (pediatric): Secondary | ICD-10-CM | POA: Insufficient documentation

## 2018-10-11 DIAGNOSIS — I428 Other cardiomyopathies: Secondary | ICD-10-CM | POA: Insufficient documentation

## 2018-10-11 DIAGNOSIS — Z794 Long term (current) use of insulin: Secondary | ICD-10-CM | POA: Insufficient documentation

## 2018-10-11 DIAGNOSIS — Z982 Presence of cerebrospinal fluid drainage device: Secondary | ICD-10-CM | POA: Insufficient documentation

## 2018-10-11 DIAGNOSIS — E785 Hyperlipidemia, unspecified: Secondary | ICD-10-CM | POA: Insufficient documentation

## 2018-10-11 DIAGNOSIS — I480 Paroxysmal atrial fibrillation: Secondary | ICD-10-CM | POA: Diagnosis not present

## 2018-10-11 DIAGNOSIS — E669 Obesity, unspecified: Secondary | ICD-10-CM | POA: Diagnosis not present

## 2018-10-11 DIAGNOSIS — I130 Hypertensive heart and chronic kidney disease with heart failure and stage 1 through stage 4 chronic kidney disease, or unspecified chronic kidney disease: Secondary | ICD-10-CM | POA: Insufficient documentation

## 2018-10-11 DIAGNOSIS — I5022 Chronic systolic (congestive) heart failure: Secondary | ICD-10-CM | POA: Diagnosis not present

## 2018-10-11 DIAGNOSIS — Z95810 Presence of automatic (implantable) cardiac defibrillator: Secondary | ICD-10-CM | POA: Diagnosis not present

## 2018-10-11 DIAGNOSIS — Z6835 Body mass index (BMI) 35.0-35.9, adult: Secondary | ICD-10-CM | POA: Diagnosis not present

## 2018-10-11 DIAGNOSIS — Z7901 Long term (current) use of anticoagulants: Secondary | ICD-10-CM | POA: Diagnosis not present

## 2018-10-11 DIAGNOSIS — I447 Left bundle-branch block, unspecified: Secondary | ICD-10-CM | POA: Insufficient documentation

## 2018-10-11 NOTE — Patient Instructions (Signed)
Your physician recommends that you schedule a follow-up appointment in: 3 months with Dr. Aundra Dubin.

## 2018-10-12 ENCOUNTER — Encounter: Payer: Self-pay | Admitting: Adult Health

## 2018-10-12 NOTE — Progress Notes (Signed)
Patient ID: Justin Hill, male   DOB: 1941-09-29, 77 y.o.   MRN: [id number] PCP: Dr. Camillia Herter Cardiology: Dr. Aundra Dubin  77 y.o. with history CKD, HTN, diabetes, chronic LBBB, and nonischemic cardiomyopathy presents for followup of CHF.  His cardiomyopathy has been known for years.  He had a LHC in 2008 showing mild nonobstructive CAD.     Justin Hill was admitted in 2/16 with atrial flutter degenerating into atrial fibrillation.  He was initially cardioverted, then atrial flutter recurred.  He had atrial flutter ablation by Dr Rayann Heman in 2/16.  He thinks that he has been in NSR since that time, no further palpitations. He is on Xarelto now with no melena or BRBPR.    He is s/p placement of Medtronic CRT-D device.  Echo in 1/18 showed improvement in EF to 40-45%.  Echo in 3/19 showed EF 40-45%.   He developed normal pressure hydrocephalus and had VP shunt placed.  This has helped with his walking and some with his memory.   He is not very active but denies significant exertional dyspnea or chest pain.  No lightheadedness.  Weight is up about 6 lbs.       Medtronic device interrogation: Stable thoracic impedance, fluid index < threshold, >99% BiV pacing, rare atrial fibrillation.    Labs (5/14): K 4, creatinine 1.4, LFTs normal, LDL 46, HDL 37 Labs (1/15): K 4.4, creatinine 1.46, LDL 74, HDL 35 Labs (4/15); LDL 96, HDL 44, K 5.2, creatinine 1.4 Labs (1/16): K 4.9, creatinine 1.36 Labs (2/16): K 4.5, creatinine 1.56, BNP 412 Labs (9/16): K 4.8, creatinine 1.33, hgb 14.7 Labs (11/16): K 4.3, creatinine 1.37, BNP 52 Labs (1/17): K 4.5 => 5, creatinine 1.35 => 1.17, HCT 47.4 Labs (2/17): K 4.9, creatinine 1.35 Labs (5/17): LDL 48, LFTs normal Labs (9/17): K 5, creatinine 1.21 Labs (11/17): HCT 41.4, digoxin 0.3, K 4.9, creatinine 1.27 Labs (5/18): K 5.2, creatinine 1.47 Labs (7/18): digoxin 0.3 Labs (9/18): LDL 82, HDL 49, K 4.5, creatinine 1.34 Labs (3/19): digoxin 0.4, K 4.5, creatinine  1.42 Labs (6/19): K 5.2, LDL 76 Labs (10/19): K 5, creatinine 1.57 Labs (11/19): creatinine 1.9  PMH: 1. Type II diabetes with peripheral neuropathy.  2. CKD 3. Hyperlipidemia 4. HTN: ACEI cough.  5. Nonischemic cardiomyopathy: This was diagnosed prior to 2008.  In 2008, patient had LHC with only mild nonobstructive coronary disease.  Echo in 1/14 showed EF 25-30% with normal RV.  Patient has not tolerated ACEI due to hyperkalemia and AKI. ICD was discussed (Dr Lovena Le) and decided against given NYHA class I symptoms.  Echo (7/15) with EF 25-30%, septal-lateral dyssynchrony, mild LV dilation. Echo (6/16) with EF 25-30%, mild LVH, septal-lateral dyssynchrony, normal RV size and systolic function. CPX (12/16) with peak VO2 14.5 (69% predicted), VE/VCO2 34, RER 1.19 => mild functional impairment from heart failure, restrictive lung function likely due to body habitus.  - LHC/RHC (6/17): 30% mid LAD stenosis; mean RA 5, PA 33/10, PCWP mean 16, CI 1.95.  - Medtronic CRT-D placed 7/17.  - Echo (1/18): EF 40-45%, mild LVH.  - Echo (3/19): EF 40-45%, moderate LVH, normal RV size/function.  6. Chronic LBBB 7. Obesity 8. Atrial flutter and atrial fibrillation: S/p atrial flutter ablation in 2/16 (Allred).  9. Normal pressure hydrocephalus: s/p VP shunt.  10. OSA: Uses CPAP.   SH: Lives in [place], married, prior smoker, no ETOH.  1 child.   FH: Grandmother with CHF.  Mother with CVA.   ROS: All  systems reviewed and negative except as per HPI.    Current Outpatient Medications  Medication Sig Dispense Refill  . Ascorbic Acid (VITAMIN C) 1000 MG tablet Take 1 tablet by mouth 2 (two) times daily.     . carvedilol (COREG) 25 MG tablet TAKE 1 TABLET BY MOUTH TWICE DAILY WITH MEALS 180 tablet 3  . ENTRESTO 97-103 MG TAKE 1 TABLET BY MOUTH TWICE DAILY 180 tablet 1  . fenofibrate 54 MG tablet Take 1 tablet (54 mg total) by mouth daily. 90 tablet 3  . insulin NPH-regular Human (70-30) 100 UNIT/ML  injection 80 units with breakfast, and 70 units with supper, and syringes 2/day 100 mL 11  . loratadine (CLARITIN) 10 MG tablet Take 1 tablet by mouth 2 (two) times daily.     . Multiple Vitamin (MULTIVITAMIN) tablet Take 1 tablet by mouth daily.      Marland Kitchen omeprazole (PRILOSEC) 40 MG capsule Take 40 mg by mouth daily. OTC    . simvastatin (ZOCOR) 40 MG tablet Take 40 mg by mouth at bedtime.      . tamsulosin (FLOMAX) 0.4 MG CAPS capsule Take 0.4 mg by mouth daily. 08/20/18 Reported stopped by [facility] ER due to causing dehydration    . torsemide (DEMADEX) 10 MG tablet Take 1 tablet (10 mg total) by mouth daily. 90 tablet 3  . XARELTO 20 MG TABS tablet TAKE 1 TABLET BY MOUTH ONCE DAILY WITH  SUPPER 90 tablet 3  . tadalafil (CIALIS) 5 MG tablet Take 5 mg by mouth daily.  3   No current facility-administered medications for this encounter.     BP 110/62   Pulse 87   Wt 117.9 kg (260 lb)   SpO2 99%   BMI 35.26 kg/m  General: NAD Neck: No JVD, no thyromegaly or thyroid nodule.  Lungs: Clear to auscultation bilaterally with normal respiratory effort. CV: Nondisplaced PMI.  Heart regular S1/S2, no S3/S4, no murmur.  No peripheral edema.  No carotid bruit.  Normal pedal pulses.  Abdomen: Soft, nontender, no hepatosplenomegaly, no distention.  Skin: Intact without lesions or rashes.  Neurologic: Alert and oriented x 3.  Psych: Normal affect. Extremities: No clubbing or cyanosis.  HEENT: Normal.   Assessment/Plan: 1. Cardiomyopathy: Nonischemic.  Low EF x years. Echo 6/16 showed stable EF 25-30% with septal-lateral dyssynchrony.  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  Interestingly, it also appears that he has a long-standing LBBB.  A chronic LBBB itself can potentially cause a cardiomyopathy and may be the source of his low EF.   Coronary angiography in 6/17 with nonobstructive disease. Now s/p Medtronic CRT-D device placement.   Most recent echo in 3/19 with EF up to 40-45%.  NYHA class II symptoms, not volume overload by exam or Optivol.  Weight is up but suspect this is caloric.  - Continue Coreg, he is at goal dose. - Continue Entresto 97/103 bid.   - K and creatinine have been high, will not start spironolactone. Needs BMET today.  - Continue torsemide 10 mg daily.    2. Hyperlipidemia: Patient is on simvastatin.  6/19 LDL ok.  3. CKD: Stage 3.  BMET today.  4. Atrial flutter: s/p ablation, in NSR today.  5. Atrial fibrillation: Patient had afib noted as well as flutter.  Therefore, I think that he should stay on Xarelto long-term.  NSR today.  6. Normal pressure hydrocephalus: Has VP shunt.  7. OSA: Continue to use CPAP.  8. Type II diabetes: When creatinine stabilizes, would recommend addition of empagliflozin or dapagliflozin to his diabetes regimen.   Followup in 4 months.  Loralie Champagne 10/12/2018

## 2018-10-14 ENCOUNTER — Ambulatory Visit (INDEPENDENT_AMBULATORY_CARE_PROVIDER_SITE_OTHER): Payer: Medicare Other | Admitting: Adult Health

## 2018-10-14 ENCOUNTER — Encounter: Payer: Self-pay | Admitting: Adult Health

## 2018-10-14 VITALS — BP 126/59 | HR 80 | Ht 72.0 in | Wt 261.6 lb

## 2018-10-14 DIAGNOSIS — G4733 Obstructive sleep apnea (adult) (pediatric): Secondary | ICD-10-CM

## 2018-10-14 DIAGNOSIS — Z9989 Dependence on other enabling machines and devices: Secondary | ICD-10-CM | POA: Diagnosis not present

## 2018-10-14 NOTE — Progress Notes (Addendum)
PATIENT: Justin Hill DOB: 26-Jun-1941  REASON FOR VISIT: follow up HISTORY FROM: patient  HISTORY OF PRESENT ILLNESS: Today 10/14/18:  Mr. Pasternak is a 77 year old male with a history of obstructive sleep apnea on CPAP.  He returns today for follow-up.  His CPAP download indicates that he uses machine 29 out of 30 days for compliance of 97%.  He uses machine greater than 4 hours 22 out of 30 days for compliance of 73%.  On average he uses his machine 4 hours and 52 minutes.  His residual AHI is 2.6 on 8 cm of water with EPR of 3.  He reports that the CPAP continues to work well for him.  He states that he tends to still does during the day he also does not sleep well at night.  He states that he is up and down most of the night.  There is some nights that he does not put the CPAP back on.  He returns today for evaluation.  HISTORY (copied from Dr. Guadelupe Sabin note) 04/08/2018: I reviewed his CPAP compliance data from 03/08/2018 through 04/06/2018 which is a total of 30 days, during which time he used his CPAP every night with percent used days greater than 4 hours at 93%, indicating excellent compliance with an average usage of 7 hours and 28 minutes which shows extended use, residual AHI mildly suboptimal at 7.6 per hour, leak on the high side with the 95th percentile at 34.8 L/m on a pressure of 8 cm with EPR of 3. Residual sleep-disordered breathing appears to be primarily obstructive in nature. He reportsdoing okay. Memory stable per wife. Puts the CPAP on in the evening when watching TV, hence the extended use. Sleeps about 7 or 8 hours, per wife.    REVIEW OF SYSTEMS: Out of a complete 14 system review of symptoms, the patient complains only of the following symptoms, and all other reviewed systems are negative.  Epworth sleepiness score 15  ALLERGIES: Allergies  Allergen Reactions  . Penicillins Anaphylaxis and Other (See Comments)    SYNCOPE PATIENT HAS HAD A PCN REACTION  WITH IMMEDIATE RASH, FACIAL/TONGUE/THROAT SWELLING, SOB, OR LIGHTHEADEDNESS WITH HYPOTENSION:  #  #  #  YES  #  #  #   Has patient had a PCN reaction causing severe rash involving mucus membranes or skin necrosis: no PATIENT HAS HAD A PCN REACTION THAT REQUIRED HOSPITALIZATION:  #  #  #  YES  #  #  #  Has patient had a PCN reaction occurring within the last 10 years: no    . Aspirin Other (See Comments)    Burps, burning, stomach pains, etc  . Lisinopril Cough    Severe coughing    HOME MEDICATIONS: Outpatient Medications Prior to Visit  Medication Sig Dispense Refill  . Ascorbic Acid (VITAMIN C) 1000 MG tablet Take 1 tablet by mouth 2 (two) times daily.     . carvedilol (COREG) 25 MG tablet TAKE 1 TABLET BY MOUTH TWICE DAILY WITH MEALS 180 tablet 3  . ENTRESTO 97-103 MG TAKE 1 TABLET BY MOUTH TWICE DAILY 180 tablet 1  . fenofibrate 54 MG tablet Take 1 tablet (54 mg total) by mouth daily. 90 tablet 3  . insulin NPH-regular Human (70-30) 100 UNIT/ML injection 80 units with breakfast, and 70 units with supper, and syringes 2/day 100 mL 11  . loratadine (CLARITIN) 10 MG tablet Take 1 tablet by mouth 2 (two) times daily.     Marland Kitchen  Multiple Vitamin (MULTIVITAMIN) tablet Take 1 tablet by mouth daily.      Marland Kitchen omeprazole (PRILOSEC) 40 MG capsule Take 40 mg by mouth daily. OTC    . simvastatin (ZOCOR) 40 MG tablet Take 40 mg by mouth at bedtime.      . tadalafil (CIALIS) 5 MG tablet Take 5 mg by mouth daily.  3  . tamsulosin (FLOMAX) 0.4 MG CAPS capsule Take 0.4 mg by mouth daily. 08/20/18 Reported stopped by [facility] ER due to causing dehydration    . torsemide (DEMADEX) 10 MG tablet Take 1 tablet (10 mg total) by mouth daily. 90 tablet 3  . XARELTO 20 MG TABS tablet TAKE 1 TABLET BY MOUTH ONCE DAILY WITH  SUPPER 90 tablet 3   No facility-administered medications prior to visit.     PAST MEDICAL HISTORY: Past Medical History:  Diagnosis Date  . Altered mental status   . Cardiomyopathy   . CHF  (congestive heart failure) ([place])   . Chronic kidney disease (CKD), stage III (moderate) ([facility])   . Communicating hydrocephalus ([place])   . Diabetes mellitus    type 2  . Diverticulosis   . History of kidney stones   . Hypercholesterolemia   . Hyperglycemia   . Hypertension   . Paroxysmal atrial fibrillation (H[facility])   . Personal history of kidney stones   . Pneumonia   . Sleep apnea    uses cpap    PAST SURGICAL HISTORY: Past Surgical History:  Procedure Laterality Date  . ATRIAL FLUTTER ABLATION N/A 01/19/2015   Procedure: ATRIAL FLUTTER ABLATION;  Surgeon: [name], MD;  Location: [facility] CATH LAB;  Service: Cardiovascular;  Laterality: N/A;  . CARDIAC CATHETERIZATION N/A 05/02/2016   Procedure: Right/Left Heart Cath and Coronary Angiography;  Surgeon: Larey Dresser, MD;  Location: [facility] CV LAB;  Service: Cardiovascular;  Laterality: N/A;  . COLONOSCOPY N/A 12/29/2013   Procedure: COLONOSCOPY;  Surgeon: Lafayette Dragon, MD;  Location: [facility] ENDOSCOPY;  Service: Endoscopy;  Laterality: N/A;  . EP IMPLANTABLE DEVICE N/A 06/05/2016   MDT Hillery Aldo XT CRTD implanted by Dr Rayann Heman   . EYE SURGERY     cataract surgery (not sure which eye)  . Blackgum   left. Muscle reattachment  . TOE SURGERY Right 12/2012   2nd toe  . TONSILLECTOMY    . VENTRICULOPERITONEAL SHUNT Right 11/20/2017   Procedure: SHUNT PLACEMENT RIGHT;  Surgeon: Newman Pies, MD;  Location: [place];  Service: Neurosurgery;  Laterality: Right;  SHUNT PLACEMENT RIGHT    FAMILY HISTORY: Family History  Problem Relation Age of Onset  . Heart failure Maternal Grandmother   . Diabetes type II Sister 17  . Cancer Sister   . Hyperlipidemia Other        Parent  . Stroke Other        Parent  . Hypertension Other        Parent  . Diabetes Other        Parent  . Colon cancer Neg Hx   . Heart attack Neg Hx     SOCIAL HISTORY: Social History   Socioeconomic History  . Marital status: Married    Spouse  name: Rosemarie Beath  . Number of children: 1  . Years of education: 37  . Highest education level: Not on file  Occupational History  . Occupation: retired  Scientific laboratory technician  . Financial resource strain: Not on file  . Food insecurity:    Worry: Not  on file    Inability: Not on file  . Transportation needs:    Medical: Not on file    Non-medical: Not on file  Tobacco Use  . Smoking status: Former Smoker    Types: Cigarettes  . Smokeless tobacco: Never Used  Substance and Sexual Activity  . Alcohol use: No  . Drug use: No  . Sexual activity: Not on file  Lifestyle  . Physical activity:    Days per week: Not on file    Minutes per session: Not on file  . Stress: Not on file  Relationships  . Social connections:    Talks on phone: Not on file    Gets together: Not on file    Attends religious service: Not on file    Active member of club or organization: Not on file    Attends meetings of clubs or organizations: Not on file    Relationship status: Not on file  . Intimate partner violence:    Fear of current or ex partner: Not on file    Emotionally abused: Not on file    Physically abused: Not on file    Forced sexual activity: Not on file  Other Topics Concern  . Not on file  Social History Narrative   Regular exercise-no   Rare caffeine use       PHYSICAL EXAM  Vitals:   10/14/18 1358  BP: (!) 126/59  Pulse: 80  Weight: 261 lb 9.6 oz (118.7 kg)  Height: 6' (1.829 m)   Body mass index is 35.48 kg/m.  Generalized: Well developed, in no acute distress   Neurological examination  Mentation: Alert oriented to time, place, history taking. Follows all commands speech and language fluent Cranial nerve II-XII:  Extraocular movements were full, visual field were full on confrontational test. Facial sensation and strength were normal. Uvula tongue midline. Head turning and shoulder shrug  were normal and symmetric.  Neck circumference 18 inches, Mallampati 3+ Motor: The  motor testing reveals 5 over 5 strength of all 4 extremities. Good symmetric motor tone is noted throughout.  Sensory: Sensory testing is intact to soft touch on all 4 extremities. No evidence of extinction is noted.  Coordination: Cerebellar testing reveals good finger-nose-finger and heel-to-shin bilaterally.  Gait and station: Gait is normal.  Reflexes: Deep tendon reflexes are symmetric and normal bilaterally.   DIAGNOSTIC DATA (LABS, IMAGING, TESTING) - I reviewed patient records, labs, notes, testing and imaging myself where available.  Lab Results  Component Value Date   WBC 5.9 05/04/2018   HGB 13.8 05/04/2018   HCT 41.8 05/04/2018   MCV 90.9 05/04/2018   PLT 131 (L) 05/04/2018      Component Value Date/Time   NA 139 08/24/2018 1230   K 5.0 08/24/2018 1230   CL 103 08/24/2018 1230   CO2 27 08/24/2018 1230   GLUCOSE 193 (H) 08/24/2018 1230   GLUCOSE 298 (H) 05/04/2018 1511   BUN 15 08/24/2018 1230   CREATININE 1.57 (H) 08/24/2018 1230   CREATININE 1.52 (H) 07/23/2016 0930   CALCIUM 9.6 08/24/2018 1230   PROT 6.6 08/03/2017 0959   ALBUMIN 4.3 08/03/2017 0959   AST 19 08/03/2017 0959   ALT 20 08/03/2017 0959   ALKPHOS 47 08/03/2017 0959   BILITOT 0.3 08/03/2017 0959   GFRNONAA 42 (L) 08/24/2018 1230   GFRAA 49 (L) 08/24/2018 1230   Lab Results  Component Value Date   CHOL 173 05/04/2018   HDL 41 05/04/2018  LDLCALC 76 05/04/2018   TRIG 278 (H) 05/04/2018   CHOLHDL 4.2 05/04/2018   Lab Results  Component Value Date   HGBA1C 8.1 (A) 09/30/2018   Lab Results  Component Value Date   VITAMINB12 279 02/05/2017   Lab Results  Component Value Date   TSH 1.580 02/05/2017      ASSESSMENT AND PLAN 77 y.o. year old male  has a past medical history of Altered mental status, Cardiomyopathy, CHF (congestive heart failure) ([place]), Chronic kidney disease (CKD), stage III (moderate) ([place]), Communicating hydrocephalus ([place]), Diabetes mellitus, Diverticulosis, History  of kidney stones, Hypercholesterolemia, Hyperglycemia, Hypertension, Paroxysmal atrial fibrillation ([place]), Personal history of kidney stones, Pneumonia, and Sleep apnea. here with:  1.  Obstructive sleep apnea on CPAP  Patient CPAP download shows good compliance and good treatment of his apnea.  He is encouraged to use the CPAP nightly and greater than 4 hours each night.  He is advised that if his symptoms worsen or he develops new symptoms he should let us know.  He will follow-up in 6 months or sooner if needed.  I spent 15 minutes with the patient. 50% of this time was spent reviewing CPAP download   Ward Givens, MSN, NP-C 10/14/2018, 2:03 PM [facility] [address] [place], [place] [address] [phone]  I reviewed the above note and documentation by the Nurse Practitioner and agree with the history, physical exam, assessment and plan as outlined above. I was immediately available for face-to-face consultation. Star Age, MD, PhD [facility])

## 2018-10-14 NOTE — Patient Instructions (Addendum)
Your Plan:  Continue using CPAP nightly and greater than 4 hours each night If your symptoms worsen or you develop new symptoms please let us know.   Please call [employer] at [phone], and press option 1 when prompted. Their customer service representatives will be glad to assist you. If they are unable to answer, please leave a message and they will call you back. Make sure to leave your name and return phone number.  Thank you for coming to see [place] at [facility]. I hope we have been able to provide you high quality care today.  You may receive a patient satisfaction survey over the next few weeks. We would appreciate your feedback and comments so that we may continue to improve ourselves and the health of our patients.

## 2018-10-26 DIAGNOSIS — I5022 Chronic systolic (congestive) heart failure: Secondary | ICD-10-CM | POA: Diagnosis not present

## 2018-10-26 DIAGNOSIS — N401 Enlarged prostate with lower urinary tract symptoms: Secondary | ICD-10-CM | POA: Diagnosis not present

## 2018-10-26 DIAGNOSIS — N200 Calculus of kidney: Secondary | ICD-10-CM | POA: Diagnosis not present

## 2018-10-26 DIAGNOSIS — N183 Chronic kidney disease, stage 3 (moderate): Secondary | ICD-10-CM | POA: Diagnosis not present

## 2018-10-26 DIAGNOSIS — E1122 Type 2 diabetes mellitus with diabetic chronic kidney disease: Secondary | ICD-10-CM | POA: Diagnosis not present

## 2018-10-26 DIAGNOSIS — N390 Urinary tract infection, site not specified: Secondary | ICD-10-CM | POA: Diagnosis not present

## 2018-10-26 DIAGNOSIS — I129 Hypertensive chronic kidney disease with stage 1 through stage 4 chronic kidney disease, or unspecified chronic kidney disease: Secondary | ICD-10-CM | POA: Diagnosis not present

## 2018-11-01 DIAGNOSIS — M2041 Other hammer toe(s) (acquired), right foot: Secondary | ICD-10-CM | POA: Diagnosis not present

## 2018-11-01 DIAGNOSIS — Z89421 Acquired absence of other right toe(s): Secondary | ICD-10-CM | POA: Diagnosis not present

## 2018-11-01 DIAGNOSIS — E1142 Type 2 diabetes mellitus with diabetic polyneuropathy: Secondary | ICD-10-CM | POA: Diagnosis not present

## 2018-11-01 DIAGNOSIS — B351 Tinea unguium: Secondary | ICD-10-CM | POA: Diagnosis not present

## 2018-11-01 DIAGNOSIS — M2042 Other hammer toe(s) (acquired), left foot: Secondary | ICD-10-CM | POA: Diagnosis not present

## 2018-11-01 DIAGNOSIS — L84 Corns and callosities: Secondary | ICD-10-CM | POA: Diagnosis not present

## 2018-11-05 ENCOUNTER — Ambulatory Visit (INDEPENDENT_AMBULATORY_CARE_PROVIDER_SITE_OTHER): Payer: Medicare Other

## 2018-11-05 DIAGNOSIS — I5022 Chronic systolic (congestive) heart failure: Secondary | ICD-10-CM | POA: Diagnosis not present

## 2018-11-05 DIAGNOSIS — Z95810 Presence of automatic (implantable) cardiac defibrillator: Secondary | ICD-10-CM

## 2018-11-05 NOTE — Progress Notes (Signed)
EPIC Encounter for ICM Monitoring  Patient Name: Justin Hill is a 77 y.o. male Date: 11/05/2018 Primary Care Physican: Serita Grammes, MD Primary Cardiologist:[place] Electrophysiologist: Allred Bi-V Pacing: 95.7%    Last Weight: 260lbs Today's Weight: 259 lbs              Spoke with wife. Heart Failure questions reviewed, pt asymptomatic.  Nephrologist has changed Torsemide to 10 mg every other day and change to every day if feet are swollen.          Patient has been receiving Torsemide daily for past 4 days.   Patient had labs drawn recently at Nephrologist office  Thoracic impedance abnormal since 10/24/2018 suggesting fluid accumulation.   Prescribed: Torsemide 10 mg 1 tabletdaily (changed 09/27/2018).  Labs: 08/24/2018 Creatinine 1.57, BUN 15, Potassium 5.0, Sodium 139, eGFR 42-49 08/13/2018 Creatinine 2.3, Potassium 5.2, Sodium 137 results given by wife that were drawn at [facility] ER 05/04/2018 Creatinine 1.53, BUN 10, Potassium 5.2, Sodium 138, EGFR 43-50 02/01/2018 Creatinine 1.42, BUN 10, Potassium 4.5, Sodium 137, EGFR 47-55  Recommendations: Copy of ICM check sent to Dr. Rayann Heman and Dr Aundra Dubin for review and recommendation if needed.   Follow-up plan: ICM clinic phone appointment on 11/11/2018 (manual send) to recheck fluid levels.       3 month ICM trend: 11/04/2018    1 Year ICM trend:       Rosalene Billings, RN 11/05/2018 5:49 PM

## 2018-11-07 NOTE — Progress Notes (Signed)
He needs to go back to taking torsemide 10 mg daily.  BMET 10 days.

## 2018-11-08 NOTE — Progress Notes (Signed)
Spoke with wife.  Advised Dr Aundra Dubin recommended to go back to taking Torsemide 10 mg daily instead of the dosage the nephrologist prescribed.  She verbalized understanding.  Med list reflects Dr Oleh Genin recommended dosage.  She would like to have BMET drawn at [facility] in [place] on [address] BMET order to that office and requested results be sent to Dr Claris Gladden office.

## 2018-11-09 ENCOUNTER — Telehealth: Payer: Self-pay

## 2018-11-09 MED ORDER — TORSEMIDE 10 MG PO TABS: 10.0000 mg | ORAL_TABLET | Freq: Every day | ORAL | 3 refills | Status: DC

## 2018-11-09 NOTE — Telephone Encounter (Signed)
Call to wife and advised Torsemide script was sent to [facility].

## 2018-11-09 NOTE — Telephone Encounter (Signed)
Spoke with [place] and asked if Torsemide 10 mg daily script is on file and she stated no. Reviewed script and was no print and not received by pharmacy.  Script sent to [facility] for filling

## 2018-11-09 NOTE — Telephone Encounter (Signed)
Returned wife's call as requested by voice mail message.  She stated [facility] does not have the new script for Torsemide daily.  Advised would check with [facility] and call her back.

## 2018-11-10 ENCOUNTER — Telehealth: Payer: Self-pay | Admitting: Endocrinology

## 2018-11-10 NOTE — Telephone Encounter (Signed)
Spoke to pt wife and she stated that blood sugars have been as follows: 62 this a.m. 65 11/09/18 a.m. 96 @ lunch 115 @ dinner 11/08/18 118 at dinner 11/07/18 88 a.m. 132 lunch 114 dinner Pt is currently on humulin 70/30  80 units in the morning and 70 in the evening please advise

## 2018-11-10 NOTE — Telephone Encounter (Signed)
Spoke to wife Rosemarie Beath and she stated that she understood instructions and that she would keep a log and call back Friday morning with update

## 2018-11-10 NOTE — Telephone Encounter (Signed)
Patients wife called stating husband is having low blood sugar readings this morning. Please Advise, thanks

## 2018-11-11 ENCOUNTER — Ambulatory Visit (INDEPENDENT_AMBULATORY_CARE_PROVIDER_SITE_OTHER): Payer: Medicare Other

## 2018-11-11 ENCOUNTER — Telehealth: Payer: Self-pay

## 2018-11-11 DIAGNOSIS — Z95810 Presence of automatic (implantable) cardiac defibrillator: Secondary | ICD-10-CM

## 2018-11-11 DIAGNOSIS — I5022 Chronic systolic (congestive) heart failure: Secondary | ICD-10-CM

## 2018-11-11 NOTE — Telephone Encounter (Signed)
Confirmed remote transmission w/ pt wife.   

## 2018-11-11 NOTE — Progress Notes (Signed)
EPIC Encounter for ICM Monitoring  Patient Name: Justin Hill is a 77 y.o. male Date: 11/11/2018 Primary Care Physican: Serita Grammes, MD Primary Cardiologist:[place] Electrophysiologist: Allred Bi-V Pacing: 93.4% Last Weight:259lbs Today's Weight: unknown       Spoke with wife.  Patient asymptomatic, no leg swelling. He currently has a cold.  Patient is taking Torsemide 10 mg daily as instructed.  Advised to have patient weight every morning before eating or drinking with same clothes on for consistentency   Thoracic impedance abnormal suggesting fluid accumulation but slightly starting to trend toward baseline.     Prescribed: Torsemide 10 mg 1 tabletdaily since 11/01/2018 as instructed by Dr Aundra Dubin.  Labs: 08/24/2018 Creatinine 1.57, BUN 15, Potassium 5.0, Sodium 139, eGFR 42-49 08/13/2018 Creatinine 2.3, Potassium 5.2, Sodium 137 results given by wife that were drawn at [facility] ER 05/04/2018 Creatinine 1.53, BUN 10, Potassium 5.2, Sodium 138, EGFR 43-50 02/01/2018 Creatinine 1.42, BUN 10, Potassium 4.5, Sodium 137, EGFR 47-55  Recommendations: No changes.   Encouraged to call for fluid symptoms.  Follow-up plan: ICM clinic phone appointment on 11/22/2018 to recheck fluid levels.    Copy of ICM check sent to Dr. Rayann Heman and Dr Aundra Dubin for review and if any recommendations will call back.    3 month ICM trend: 11/11/2018    1 Year ICM trend:       Rosalene Billings, RN 11/11/2018 3:01 PM

## 2018-11-12 ENCOUNTER — Other Ambulatory Visit: Payer: Self-pay | Admitting: Cardiology

## 2018-11-13 NOTE — Progress Notes (Signed)
He can increase torsemide to 20 mg daily.  Needs BMET 1 week.

## 2018-11-15 MED ORDER — TORSEMIDE 10 MG PO TABS: ORAL_TABLET | ORAL | 3 refills | Status: DC

## 2018-11-15 NOTE — Progress Notes (Signed)
Attempted call to wife to provide Dr Claris Gladden recommendations.  Left message for return call.

## 2018-11-15 NOTE — Progress Notes (Signed)
Returned wifes call as requested.  Advised Dr Aundra Dubin ordered to increase Torsemide 10 mg to 2 tablets (20 mg total) daily and BMET in a week. She has supply on hand and advised will change script dosage but to call when refill is needed.  She verbalized understanding of new orders.

## 2018-11-15 NOTE — Addendum Note (Signed)
Addended by: Rosalene Billings on: 11/15/2018 03:01 PM   Modules accepted: Orders

## 2018-11-18 ENCOUNTER — Telehealth: Payer: Self-pay

## 2018-11-18 NOTE — Telephone Encounter (Signed)
Returned call to wife as requested by voice mail message.  She stated patient has a cold and cough is getting better but she is concerned it stay may be fluid accumulation.  Advised to send remote transmission for review.

## 2018-11-18 NOTE — Telephone Encounter (Signed)
Call to [facility] and confirmed there is a BMET order on file to be drawn on 11/22/2018

## 2018-11-18 NOTE — Telephone Encounter (Signed)
Returned call to wife.  Advised the remote transmission is returning to normal and almost back at baseline.  She said he has head cold congestion. Advised to call PCP for follow up regarding the cold.  She said she is going to take patient to [place] for blood work on 12/30 instead of at the HF clinic.  Advised will confirm [facility] has the BMET order. Canceled the appointment at HF lab.    11/18/2018 [facility]

## 2018-11-22 ENCOUNTER — Ambulatory Visit (INDEPENDENT_AMBULATORY_CARE_PROVIDER_SITE_OTHER): Payer: Medicare Other

## 2018-11-22 ENCOUNTER — Other Ambulatory Visit (HOSPITAL_COMMUNITY): Payer: Medicare Other

## 2018-11-22 ENCOUNTER — Other Ambulatory Visit: Payer: Self-pay | Admitting: Cardiology

## 2018-11-22 DIAGNOSIS — I5022 Chronic systolic (congestive) heart failure: Secondary | ICD-10-CM

## 2018-11-22 DIAGNOSIS — Z95810 Presence of automatic (implantable) cardiac defibrillator: Secondary | ICD-10-CM

## 2018-11-22 DIAGNOSIS — I428 Other cardiomyopathies: Secondary | ICD-10-CM

## 2018-11-22 LAB — BASIC METABOLIC PANEL
BUN/Creatinine Ratio: 11 (ref 10–24)
BUN: 19 mg/dL (ref 8–27)
CHLORIDE: 100 mmol/L (ref 96–106)
CO2: 28 mmol/L (ref 20–29)
Calcium: 9.8 mg/dL (ref 8.6–10.2)
Creatinine, Ser: 1.66 mg/dL — ABNORMAL HIGH (ref 0.76–1.27)
GFR, EST AFRICAN AMERICAN: 46 mL/min/{1.73_m2} — AB (ref >59)
GFR, EST NON AFRICAN AMERICAN: 40 mL/min/{1.73_m2} — AB (ref >59)
Glucose: 257 mg/dL — ABNORMAL HIGH (ref 65–99)
Potassium: 4.5 mmol/L (ref 3.5–5.2)
Sodium: 139 mmol/L (ref 134–144)

## 2018-11-22 LAB — SPECIMEN STATUS REPORT

## 2018-11-22 NOTE — Progress Notes (Signed)
Remote ICD transmission.   

## 2018-11-22 NOTE — Progress Notes (Signed)
EPIC Encounter for ICM Monitoring  Patient Name: Justin Hill is a 77 y.o. male Date: 11/22/2018 Primary Care Physican: Serita Grammes, MD Primary Cardiologist:McLean Electrophysiologist: Allred Bi-V Pacing: 93.4% Last Weight:259lbs Today's Weight: 255 lbs                                                           Spoke with wife.  Patient asymptomatic. Patient's cold is improving, no leg swelling.    Thoracic impedance returned to normal after Torsemide was increased to 20 mg daily.  Prescribed: Torsemide 10 mg 2 tablets(20 mg total) daily (increased 11/15/2018)  Labs:11/22/18 labs drawn at [facility] 08/24/2018 Creatinine 1.57, BUN 15, Potassium 5.0, Sodium 139, eGFR 42-49 08/13/2018 Creatinine 2.3, Potassium 5.2, Sodium 137 results given by wife that were drawn at [facility] ER 05/04/2018 Creatinine 1.53, BUN 10, Potassium 5.2, Sodium 138, EGFR 43-50 02/01/2018 Creatinine 1.42, BUN 10, Potassium 4.5, Sodium 137, EGFR 47-55  Recommendations:  No changes  Follow-up plan: ICM clinic phone appointment on 12/20/2018.    Copy of ICM check sent to Dr. Rayann Heman and Dr Aundra Dubin.    3 month ICM trend: 11/22/2018    1 Year ICM trend:       Rosalene Billings, RN 11/22/2018 2:58 PM

## 2018-11-23 LAB — CUP PACEART REMOTE DEVICE CHECK
Battery Remaining Longevity: 37 mo
Battery Voltage: 2.95 V
Brady Statistic AP VP Percent: 0.26 %
Brady Statistic AP VS Percent: 0.03 %
Brady Statistic AS VP Percent: 97.95 %
Brady Statistic AS VS Percent: 1.76 %
Brady Statistic RA Percent Paced: 0.28 %
Brady Statistic RV Percent Paced: 6.08 %
Date Time Interrogation Session: 20191230113524
HighPow Impedance: 72 Ohm
Implantable Lead Implant Date: 20170713
Implantable Lead Implant Date: 20170713
Implantable Lead Location: 753858
Implantable Lead Location: 753859
Implantable Lead Location: 753860
Implantable Lead Model: 4598
Implantable Pulse Generator Implant Date: 20170713
Lead Channel Impedance Value: 361 Ohm
Lead Channel Impedance Value: 399 Ohm
Lead Channel Impedance Value: 399 Ohm
Lead Channel Impedance Value: 399 Ohm
Lead Channel Impedance Value: 475 Ohm
Lead Channel Impedance Value: 475 Ohm
Lead Channel Impedance Value: 551 Ohm
Lead Channel Impedance Value: 646 Ohm
Lead Channel Impedance Value: 817 Ohm
Lead Channel Impedance Value: 836 Ohm
Lead Channel Pacing Threshold Amplitude: 0.5 V
Lead Channel Pacing Threshold Amplitude: 0.5 V
Lead Channel Pacing Threshold Amplitude: 2.75 V
Lead Channel Pacing Threshold Pulse Width: 0.4 ms
Lead Channel Pacing Threshold Pulse Width: 0.4 ms
Lead Channel Pacing Threshold Pulse Width: 0.8 ms
Lead Channel Sensing Intrinsic Amplitude: 0.625 mV
Lead Channel Sensing Intrinsic Amplitude: 0.625 mV
Lead Channel Sensing Intrinsic Amplitude: 11.75 mV
Lead Channel Sensing Intrinsic Amplitude: 11.75 mV
Lead Channel Setting Pacing Amplitude: 2 V
Lead Channel Setting Pacing Amplitude: 3.75 V
Lead Channel Setting Pacing Pulse Width: 0.4 ms
Lead Channel Setting Pacing Pulse Width: 0.8 ms
Lead Channel Setting Sensing Sensitivity: 0.3 mV
MDC IDC LEAD IMPLANT DT: 20170713
MDC IDC MSMT LEADCHNL LV IMPEDANCE VALUE: 665 Ohm
MDC IDC MSMT LEADCHNL LV IMPEDANCE VALUE: 817 Ohm
MDC IDC MSMT LEADCHNL RV IMPEDANCE VALUE: 399 Ohm
MDC IDC SET LEADCHNL RV PACING AMPLITUDE: 2.5 V

## 2018-11-28 NOTE — Progress Notes (Signed)
Needs BMET

## 2018-11-29 ENCOUNTER — Other Ambulatory Visit: Payer: Self-pay | Admitting: Internal Medicine

## 2018-11-29 NOTE — Progress Notes (Signed)
Message from Dr Aundra Dubin:  No BMET is needed unless med change has occurred since 12/30.  No med changes have have occurred for cardiology since 12/30.    Received: Today  Message Contents  Larey Dresser, MD  Short, Laurie Panda, RN        Only if meds have changed since 12/30

## 2018-12-09 ENCOUNTER — Encounter: Payer: Self-pay | Admitting: Endocrinology

## 2018-12-09 ENCOUNTER — Ambulatory Visit (INDEPENDENT_AMBULATORY_CARE_PROVIDER_SITE_OTHER): Payer: Medicare Other | Admitting: Endocrinology

## 2018-12-09 VITALS — BP 114/68 | HR 79 | Ht 72.0 in | Wt 256.0 lb

## 2018-12-09 DIAGNOSIS — N183 Chronic kidney disease, stage 3 unspecified: Secondary | ICD-10-CM

## 2018-12-09 DIAGNOSIS — Z794 Long term (current) use of insulin: Secondary | ICD-10-CM

## 2018-12-09 DIAGNOSIS — E1122 Type 2 diabetes mellitus with diabetic chronic kidney disease: Secondary | ICD-10-CM

## 2018-12-09 LAB — POCT GLYCOSYLATED HEMOGLOBIN (HGB A1C): Hemoglobin A1C: 7.9 % — AB (ref 4.0–5.6)

## 2018-12-09 MED ORDER — INSULIN NPH ISOPHANE & REGULAR (70-30) 100 UNIT/ML ~~LOC~~ SUSP: SUBCUTANEOUS | 11 refills | Status: DC

## 2018-12-09 NOTE — Patient Instructions (Addendum)
Please continue the same insulin   On this type of insulin schedule, you should eat meals on a regular schedule.  If a meal is missed or significantly.  delayed, your blood sugar could go low.   check your blood sugar twice a day.  vary the time of day when you check, between before the 3 meals, and at bedtime.  also check if you have symptoms of your blood sugar being too high or too low.  please keep a record of the readings and bring it to your next appointment here.  please call us sooner if your blood sugar goes below 70, or if you have a lot of readings over 200.   If you have any blood sugar under 80, please write on the paper why you think that was.   Please come back for a follow-up appointment in 3 months.

## 2018-12-09 NOTE — Progress Notes (Signed)
Subjective:    Patient ID: Justin Hill, male    DOB: 1941/05/07, 78 y.o.   MRN: [id number]  HPI Pt returns for f/u of diabetes mellitus: DM type: Insulin-requiring type 2. Dx'ed: 9892 Complications: CHF, toe ulcer, partial toe amputation, polyneuropathy, and renal insufficiency.  Therapy: insulin since 2012.  DKA: never.   Severe hypoglycemia: never.    Pancreatitis: never.    Other: he takes human insulin, due to cost; in October of 2014, he was changed from multiple daily injections to BID, due to persistently high a1c.  wife gives insulin, due to his memory loss.   Interval history: no cbg record, but states cbg's vary from 120-200's.  There is no trend throughout the day.  No new sxs.  Past Medical History:  Diagnosis Date  . Altered mental status   . Cardiomyopathy   . CHF (congestive heart failure) ([place])   . Chronic kidney disease (CKD), stage III (moderate) (HCC)   . Communicating hydrocephalus ([place])   . Diabetes mellitus    type 2  . Diverticulosis   . History of kidney stones   . Hypercholesterolemia   . Hyperglycemia   . Hypertension   . Paroxysmal atrial fibrillation (H[facility])   . Personal history of kidney stones   . Pneumonia   . Sleep apnea    uses cpap    Past Surgical History:  Procedure Laterality Date  . ATRIAL FLUTTER ABLATION N/A 01/19/2015   Procedure: ATRIAL FLUTTER ABLATION;  Surgeon: [name], MD;  [facility] C[facility] LAB;  Service: Cardiovascular;  Laterality: N/A;  . CARDIAC CATHETERIZATION N/A 05/02/2016   Procedure: Right/Left Heart Cath and Coronary Angiography;  Surgeon: Larey Dresser, MD;  Location: [facility] CV LAB;  Service: Cardiovascular;  Laterality: N/A;  . COLONOSCOPY N/A 12/29/2013   Procedure: COLONOSCOPY;  Surgeon: Lafayette Dragon, MD;  Location: [facility] ENDOSCOPY;  Service: Endoscopy;  Laterality: N/A;  . EP IMPLANTABLE DEVICE N/A 06/05/2016   MDT Hillery Aldo XT CRTD implanted by Dr Rayann Heman   . EYE SURGERY     cataract surgery (not  sure which eye)  . [place]   left. Muscle reattachment  . TOE SURGERY Right 12/2012   2nd toe  . TONSILLECTOMY    . VENTRICULOPERITONEAL SHUNT Right 11/20/2017   Procedure: SHUNT PLACEMENT RIGHT;  Surgeon: Newman Pies, MD;  Location: [place];  Service: Neurosurgery;  Laterality: Right;  SHUNT PLACEMENT RIGHT    Social History   Socioeconomic History  . Marital status: Married    Spouse name: Rosemarie Beath  . Number of children: 1  . Years of education: 40  . Highest education level: Not on file  Occupational History  . Occupation: retired  Scientific laboratory technician  . Financial resource strain: Not on file  . Food insecurity:    Worry: Not on file    Inability: Not on file  . Transportation needs:    Medical: Not on file    Non-medical: Not on file  Tobacco Use  . Smoking status: Former Smoker    Types: Cigarettes  . Smokeless tobacco: Never Used  Substance and Sexual Activity  . Alcohol use: No  . Drug use: No  . Sexual activity: Not on file  Lifestyle  . Physical activity:    Days per week: Not on file    Minutes per session: Not on file  . Stress: Not on file  Relationships  . Social connections:    Talks on phone: Not  on file    Gets together: Not on file    Attends religious service: Not on file    Active member of club or organization: Not on file    Attends meetings of clubs or organizations: Not on file    Relationship status: Not on file  . Intimate partner violence:    Fear of current or ex partner: Not on file    Emotionally abused: Not on file    Physically abused: Not on file    Forced sexual activity: Not on file  Other Topics Concern  . Not on file  Social History Narrative   Regular exercise-no   Rare caffeine use     Current Outpatient Medications on File Prior to Visit  Medication Sig Dispense Refill  . Ascorbic Acid (VITAMIN C) 1000 MG tablet Take 1 tablet by mouth 2 (two) times daily.     . carvedilol (COREG) 25 MG tablet TAKE 1  TABLET BY MOUTH TWICE DAILY WITH MEALS 180 tablet 3  . ENTRESTO 97-103 MG TAKE 1 TABLET BY MOUTH TWICE DAILY 180 tablet 1  . fenofibrate 54 MG tablet Take 1 tablet (54 mg total) by mouth daily. 90 tablet 3  . loratadine (CLARITIN) 10 MG tablet Take 1 tablet by mouth 2 (two) times daily.     . Multiple Vitamin (MULTIVITAMIN) tablet Take 1 tablet by mouth daily.      Marland Kitchen omeprazole (PRILOSEC) 40 MG capsule Take 40 mg by mouth daily. OTC    . simvastatin (ZOCOR) 40 MG tablet Take 40 mg by mouth at bedtime.      . tadalafil (CIALIS) 5 MG tablet Take 5 mg by mouth daily.  3  . tamsulosin (FLOMAX) 0.4 MG CAPS capsule Take 0.4 mg by mouth daily. 08/20/18 Reported stopped by [facility] ER due to causing dehydration    . torsemide (DEMADEX) 10 MG tablet Take 2 tablets (20 mg total) by mouth once a day. (Patient taking differently: Take 1 tablets (20 mg total) by mouth once a day.) 180 tablet 3  . XARELTO 20 MG TABS tablet TAKE 1 TABLET BY MOUTH ONCE DAILY WITH  SUPPER 90 tablet 3   No current facility-administered medications on file prior to visit.     Allergies  Allergen Reactions  . Penicillins Anaphylaxis and Other (See Comments)    SYNCOPE PATIENT HAS HAD A PCN REACTION WITH IMMEDIATE RASH, FACIAL/TONGUE/THROAT SWELLING, SOB, OR LIGHTHEADEDNESS WITH HYPOTENSION:  #  #  #  YES  #  #  #   Has patient had a PCN reaction causing severe rash involving mucus membranes or skin necrosis: no PATIENT HAS HAD A PCN REACTION THAT REQUIRED HOSPITALIZATION:  #  #  #  YES  #  #  #  Has patient had a PCN reaction occurring within the last 10 years: no    . Aspirin Other (See Comments)    Burps, burning, stomach pains, etc  . Lisinopril Cough    Severe coughing    Family History  Problem Relation Age of Onset  . Heart failure Maternal Grandmother   . Diabetes type II Sister 18  . Cancer Sister   . Hyperlipidemia Other        Parent  . Stroke Other        Parent  . Hypertension Other        Parent  .  Diabetes Other        Parent  . Colon cancer Neg Hx   .  Heart attack Neg Hx     BP 114/68 (BP Location: Left Arm, Patient Position: Sitting, Cuff Size: Normal)   Pulse 79   Ht 6' (1.829 m)   Wt 256 lb (116.1 kg)   SpO2 96%   BMI 34.72 kg/m     Review of Systems He denies hypoglycemia    Objective:   Physical Exam VITAL SIGNS:  See vs page. GENERAL: no distress.  Pulses: foot pulses are intact bilaterally.   MSK: hammer toes on the left foot. right 2nd toe is surgically absent.  CV: 2+ bilat edema of the legs, and bilat vv's.   Skin:  no ulcer on the feet or ankles, but the skin is very dry.  normal temp on the feet and ankles. There is bilateral onychomycosis.    Neuro: sensation is intact to touch on the feet and ankles, but decreased from normal.    A1c=7.9%     Assessment & Plan:  Insulin-requiring type 2 DM: improved glycemic control Edema: this limits rx options Memory loss: he is not a candidate for aggressive glycemic control  Patient Instructions  Please continue the same insulin   On this type of insulin schedule, you should eat meals on a regular schedule.  If a meal is missed or significantly.  delayed, your blood sugar could go low.   check your blood sugar twice a day.  vary the time of day when you check, between before the 3 meals, and at bedtime.  also check if you have symptoms of your blood sugar being too high or too low.  please keep a record of the readings and bring it to your next appointment here.  please call us sooner if your blood sugar goes below 70, or if you have a lot of readings over 200.   If you have any blood sugar under 80, please write on the paper why you think that was.   Please come back for a follow-up appointment in 3 months.

## 2018-12-20 ENCOUNTER — Ambulatory Visit (INDEPENDENT_AMBULATORY_CARE_PROVIDER_SITE_OTHER): Payer: Medicare Other

## 2018-12-20 DIAGNOSIS — I5022 Chronic systolic (congestive) heart failure: Secondary | ICD-10-CM

## 2018-12-20 DIAGNOSIS — Z95810 Presence of automatic (implantable) cardiac defibrillator: Secondary | ICD-10-CM

## 2018-12-23 ENCOUNTER — Encounter: Payer: Self-pay | Admitting: Internal Medicine

## 2018-12-23 NOTE — Progress Notes (Signed)
EPIC Encounter for ICM Monitoring  Patient Name: Justin Hill is a 78 y.o. male Date: 12/23/2018 Primary Care Physican: Serita Grammes, MD Primary Cardiologist:[facility] Electrophysiologist: Allred Bi-V Pacing: 87.6% Last Weight:255lbs Today's Weight: 250 lbs   Spoke with wife. Patient has some shortness of breath with activity but no more than usual.  Thoracic impedance normal  Prescribed:Torsemide 10 mg 2 tablets(20 mg total) daily.  Patient taking differently Take 1 tablets (20 mg total) by mouth once a day.  Labs: 11/23/2019 Creatinine 1.66, BUN 19, Potassium 4.5, Sodium 139, eGFR 40-46 08/24/2018 Creatinine 1.57, BUN 15, Potassium 5.0, Sodium 139, eGFR 42-49 08/13/2018 Creatinine 2.3, Potassium 5.2, Sodium 137 results given by wife that were drawn at [facility] ER 05/04/2018 Creatinine 1.53, BUN 10, Potassium 5.2, Sodium 138, EGFR 43-50 02/01/2018 Creatinine 1.42, BUN 10, Potassium 4.5, Sodium 137, EGFR 47-55  Recommendations: No changes  Follow-up plan: ICM clinic phone appointment on3/12/2018. Office visit with Dr Aundra Dubin 01/13/2019  Copy of ICM check sent to Dr.Allred.  3 month ICM trend: 12/20/2018    1 Year ICM trend:       Rosalene Billings, RN 12/23/2018 3:23 PM

## 2018-12-24 ENCOUNTER — Telehealth: Payer: Self-pay

## 2018-12-24 NOTE — Telephone Encounter (Signed)
Returned call to wife as requested by voice mail message. She wanted clarification on letter received saying patient needed to make an appt to have office device check. Advised to call the main office number to schedule an appt with Dr Rayann Heman since last visit was 08/2017.  She stated she will call and set up appt,.

## 2019-01-11 DIAGNOSIS — N401 Enlarged prostate with lower urinary tract symptoms: Secondary | ICD-10-CM | POA: Diagnosis not present

## 2019-01-11 DIAGNOSIS — N529 Male erectile dysfunction, unspecified: Secondary | ICD-10-CM | POA: Diagnosis not present

## 2019-01-11 DIAGNOSIS — N200 Calculus of kidney: Secondary | ICD-10-CM | POA: Diagnosis not present

## 2019-01-12 ENCOUNTER — Encounter: Payer: Self-pay | Admitting: Endocrinology

## 2019-01-12 NOTE — Telephone Encounter (Signed)
Please advise 

## 2019-01-13 ENCOUNTER — Ambulatory Visit (HOSPITAL_COMMUNITY)
Admission: RE | Admit: 2019-01-13 | Discharge: 2019-01-13 | Disposition: A | Discharge status: Home / Self Care | Payer: Medicare Other | Source: Ambulatory Visit | Attending: Cardiology | Admitting: Cardiology

## 2019-01-13 ENCOUNTER — Encounter (HOSPITAL_COMMUNITY): Payer: Self-pay | Admitting: Cardiology

## 2019-01-13 VITALS — BP 116/68 | HR 94 | Wt 255.0 lb

## 2019-01-13 DIAGNOSIS — Z7901 Long term (current) use of anticoagulants: Secondary | ICD-10-CM | POA: Insufficient documentation

## 2019-01-13 DIAGNOSIS — E1142 Type 2 diabetes mellitus with diabetic polyneuropathy: Secondary | ICD-10-CM | POA: Insufficient documentation

## 2019-01-13 DIAGNOSIS — Z6834 Body mass index (BMI) 34.0-34.9, adult: Secondary | ICD-10-CM | POA: Diagnosis not present

## 2019-01-13 DIAGNOSIS — I2510 Atherosclerotic heart disease of native coronary artery without angina pectoris: Secondary | ICD-10-CM | POA: Diagnosis not present

## 2019-01-13 DIAGNOSIS — E1122 Type 2 diabetes mellitus with diabetic chronic kidney disease: Secondary | ICD-10-CM | POA: Insufficient documentation

## 2019-01-13 DIAGNOSIS — G4733 Obstructive sleep apnea (adult) (pediatric): Secondary | ICD-10-CM | POA: Insufficient documentation

## 2019-01-13 DIAGNOSIS — I130 Hypertensive heart and chronic kidney disease with heart failure and stage 1 through stage 4 chronic kidney disease, or unspecified chronic kidney disease: Secondary | ICD-10-CM | POA: Insufficient documentation

## 2019-01-13 DIAGNOSIS — Z982 Presence of cerebrospinal fluid drainage device: Secondary | ICD-10-CM | POA: Insufficient documentation

## 2019-01-13 DIAGNOSIS — I428 Other cardiomyopathies: Secondary | ICD-10-CM | POA: Diagnosis not present

## 2019-01-13 DIAGNOSIS — Z794 Long term (current) use of insulin: Secondary | ICD-10-CM | POA: Insufficient documentation

## 2019-01-13 DIAGNOSIS — N183 Chronic kidney disease, stage 3 (moderate): Secondary | ICD-10-CM | POA: Insufficient documentation

## 2019-01-13 DIAGNOSIS — E785 Hyperlipidemia, unspecified: Secondary | ICD-10-CM | POA: Insufficient documentation

## 2019-01-13 DIAGNOSIS — I447 Left bundle-branch block, unspecified: Secondary | ICD-10-CM | POA: Diagnosis not present

## 2019-01-13 DIAGNOSIS — G912 (Idiopathic) normal pressure hydrocephalus: Secondary | ICD-10-CM | POA: Diagnosis not present

## 2019-01-13 DIAGNOSIS — I4892 Unspecified atrial flutter: Secondary | ICD-10-CM | POA: Diagnosis not present

## 2019-01-13 DIAGNOSIS — I5022 Chronic systolic (congestive) heart failure: Secondary | ICD-10-CM | POA: Diagnosis not present

## 2019-01-13 DIAGNOSIS — Z79899 Other long term (current) drug therapy: Secondary | ICD-10-CM | POA: Diagnosis not present

## 2019-01-13 DIAGNOSIS — I4891 Unspecified atrial fibrillation: Secondary | ICD-10-CM | POA: Insufficient documentation

## 2019-01-13 DIAGNOSIS — Z8249 Family history of ischemic heart disease and other diseases of the circulatory system: Secondary | ICD-10-CM | POA: Insufficient documentation

## 2019-01-13 DIAGNOSIS — E669 Obesity, unspecified: Secondary | ICD-10-CM | POA: Insufficient documentation

## 2019-01-13 DIAGNOSIS — Z87891 Personal history of nicotine dependence: Secondary | ICD-10-CM | POA: Diagnosis not present

## 2019-01-13 LAB — CBC
HCT: 38.4 % — ABNORMAL LOW (ref 39.0–52.0)
Hemoglobin: 12.4 g/dL — ABNORMAL LOW (ref 13.0–17.0)
MCH: 28.1 pg (ref 26.0–34.0)
MCHC: 32.3 g/dL (ref 30.0–36.0)
MCV: 87.1 fL (ref 80.0–100.0)
Platelets: 171 10*3/uL (ref 150–400)
RBC: 4.41 MIL/uL (ref 4.22–5.81)
RDW: 13.4 % (ref 11.5–15.5)
WBC: 7 10*3/uL (ref 4.0–10.5)
nRBC: 0 % (ref 0.0–0.2)

## 2019-01-13 LAB — BASIC METABOLIC PANEL
Anion gap: 13 (ref 5–15)
BUN: 21 mg/dL (ref 8–23)
CO2: 22 mmol/L (ref 22–32)
Calcium: 9.7 mg/dL (ref 8.9–10.3)
Chloride: 101 mmol/L (ref 98–111)
Creatinine, Ser: 2.3 mg/dL — ABNORMAL HIGH (ref 0.61–1.24)
GFR calc non Af Amer: 27 mL/min — ABNORMAL LOW (ref >60)
GFR, EST AFRICAN AMERICAN: 31 mL/min — AB (ref >60)
Glucose, Bld: 315 mg/dL — ABNORMAL HIGH (ref 70–99)
Potassium: 4.5 mmol/L (ref 3.5–5.1)
Sodium: 136 mmol/L (ref 135–145)

## 2019-01-13 LAB — LIPID PANEL
Cholesterol: 131 mg/dL (ref 0–200)
HDL: 35 mg/dL — AB (ref >40)
LDL Cholesterol: 58 mg/dL (ref 0–99)
TRIGLYCERIDES: 188 mg/dL — AB (ref <150)
Total CHOL/HDL Ratio: 3.7 RATIO
VLDL: 38 mg/dL (ref 0–40)

## 2019-01-13 MED ORDER — DAPAGLIFLOZIN PROPANEDIOL 10 MG PO TABS: 10.0000 mg | ORAL_TABLET | Freq: Every day | ORAL | 6 refills | Status: DC

## 2019-01-13 NOTE — Progress Notes (Signed)
Patient ID: NNAMDI DACUS, male   DOB: 1941-09-09, 78 y.o.   MRN: [id number] PCP: Dr. Camillia Herter Cardiology: Dr. Aundra Dubin  78 y.o. with history CKD, HTN, diabetes, chronic LBBB, and nonischemic cardiomyopathy presents for followup of CHF.  His cardiomyopathy has been known for years.  He had a LHC in 2008 showing mild nonobstructive CAD.     Mr Shartzer was admitted in 2/16 with atrial flutter degenerating into atrial fibrillation.  He was initially cardioverted, then atrial flutter recurred.  He had atrial flutter ablation by Dr Rayann Heman in 2/16.  He thinks that he has been in NSR since that time, no further palpitations. He is on Xarelto now with no melena or BRBPR.    He is s/p placement of Medtronic CRT-D device.  Echo in 1/18 showed improvement in EF to 40-45%.  Echo in 3/19 showed EF 40-45%.   He developed normal pressure hydrocephalus and had VP shunt placed.  This has helped with his walking and some with his memory.   He is stable symptomatically. Weight down 5 lbs.  Occasional palpitations. He is in NSR today. No dyspnea walking on flat ground. No orthopnea/PND. No chest pain. No lightheadedness.       ECG (personally reviewed): NSR, BiV paced  Labs (5/14): K 4, creatinine 1.4, LFTs normal, LDL 46, HDL 37 Labs (1/15): K 4.4, creatinine 1.46, LDL 74, HDL 35 Labs (4/15); LDL 96, HDL 44, K 5.2, creatinine 1.4 Labs (1/16): K 4.9, creatinine 1.36 Labs (2/16): K 4.5, creatinine 1.56, BNP 412 Labs (9/16): K 4.8, creatinine 1.33, hgb 14.7 Labs (11/16): K 4.3, creatinine 1.37, BNP 52 Labs (1/17): K 4.5 => 5, creatinine 1.35 => 1.17, HCT 47.4 Labs (2/17): K 4.9, creatinine 1.35 Labs (5/17): LDL 48, LFTs normal Labs (9/17): K 5, creatinine 1.21 Labs (11/17): HCT 41.4, digoxin 0.3, K 4.9, creatinine 1.27 Labs (5/18): K 5.2, creatinine 1.47 Labs (7/18): digoxin 0.3 Labs (9/18): LDL 82, HDL 49, K 4.5, creatinine 1.34 Labs (3/19): digoxin 0.4, K 4.5, creatinine 1.42 Labs (6/19): K 5.2, LDL  76 Labs (10/19): K 5, creatinine 1.57 Labs (11/19): creatinine 1.9 Labs (12/19): K 4.5, creatinine 1.66  PMH: 1. Type II diabetes with peripheral neuropathy and nephropathy.  2. CKD stage 3 3. Hyperlipidemia 4. HTN: ACEI cough.  5. Nonischemic cardiomyopathy: This was diagnosed prior to 2008.  In 2008, patient had LHC with only mild nonobstructive coronary disease.  Echo in 1/14 showed EF 25-30% with normal RV.  Patient has not tolerated ACEI due to hyperkalemia and AKI. ICD was discussed (Dr Lovena Le) and decided against given NYHA class I symptoms.  Echo (7/15) with EF 25-30%, septal-lateral dyssynchrony, mild LV dilation. Echo (6/16) with EF 25-30%, mild LVH, septal-lateral dyssynchrony, normal RV size and systolic function. CPX (12/16) with peak VO2 14.5 (69% predicted), VE/VCO2 34, RER 1.19 => mild functional impairment from heart failure, restrictive lung function likely due to body habitus.  - LHC/RHC (6/17): 30% mid LAD stenosis; mean RA 5, PA 33/10, PCWP mean 16, CI 1.95.  - Medtronic CRT-D placed 7/17.  - Echo (1/18): EF 40-45%, mild LVH.  - Echo (3/19): EF 40-45%, moderate LVH, normal RV size/function.  6. Chronic LBBB 7. Obesity 8. Atrial flutter and atrial fibrillation: S/p atrial flutter ablation in 2/16 (Allred).  9. Normal pressure hydrocephalus: s/p VP shunt.  10. OSA: Uses CPAP.   SH: Lives in [place], married, prior smoker, no ETOH.  1 child.   FH: Grandmother with CHF.  Mother with CVA.  ROS: All systems reviewed and negative except as per HPI.    Current Outpatient Medications  Medication Sig Dispense Refill  . Ascorbic Acid (VITAMIN C) 1000 MG tablet Take 1 tablet by mouth 2 (two) times daily.     . carvedilol (COREG) 25 MG tablet TAKE 1 TABLET BY MOUTH TWICE DAILY WITH MEALS 180 tablet 3  . ENTRESTO 97-103 MG TAKE 1 TABLET BY MOUTH TWICE DAILY 180 tablet 1  . fenofibrate 54 MG tablet Take 1 tablet (54 mg total) by mouth daily. 90 tablet 3  . insulin NPH-regular  Human (70-30) 100 UNIT/ML injection 72 units with breakfast, and 56 units with supper, and syringes 2/day (Patient taking differently: 80 units with breakfast, and 70 units with supper, and syringes 2/day) 100 mL 11  . loratadine (CLARITIN) 10 MG tablet Take 1 tablet by mouth 2 (two) times daily.     . Multiple Vitamin (MULTIVITAMIN) tablet Take 1 tablet by mouth daily.      Marland Kitchen omeprazole (PRILOSEC) 40 MG capsule Take 40 mg by mouth daily. OTC    . simvastatin (ZOCOR) 40 MG tablet Take 40 mg by mouth at bedtime.      . tadalafil (CIALIS) 5 MG tablet Take 5 mg by mouth daily.  3  . tamsulosin (FLOMAX) 0.4 MG CAPS capsule Take 0.4 mg by mouth daily.    Marland Kitchen torsemide (DEMADEX) 10 MG tablet Take 10 mg by mouth daily.    Alveda Reasons 20 MG TABS tablet TAKE 1 TABLET BY MOUTH ONCE DAILY WITH  SUPPER 90 tablet 3  . dapagliflozin propanediol (FARXIGA) 10 MG TABS tablet Take 10 mg by mouth daily. 30 tablet 6   No current facility-administered medications for this encounter.     BP 116/68   Pulse 94   Wt 115.7 kg (255 lb)   SpO2 96%   BMI 34.58 kg/m  General: NAD Neck: No JVD, no thyromegaly or thyroid nodule.  Lungs: Clear to auscultation bilaterally with normal respiratory effort. CV: Nondisplaced PMI.  Heart regular S1/S2, no S3/S4, no murmur.  No peripheral edema.  No carotid bruit.  Normal pedal pulses.  Abdomen: Soft, nontender, no hepatosplenomegaly, no distention.  Skin: Intact without lesions or rashes.  Neurologic: Alert and oriented x 3.  Psych: Normal affect. Extremities: No clubbing or cyanosis.  HEENT: Normal.   Assessment/Plan: 1. Cardiomyopathy: Nonischemic.  Low EF x years. Echo 6/16 showed stable EF 25-30% with septal-lateral dyssynchrony.  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  Interestingly, it also appears that he has a long-standing LBBB.  A chronic LBBB itself can potentially cause a cardiomyopathy and may be the  source of his low EF.   Coronary angiography in 6/17 with nonobstructive disease. Now s/p Medtronic CRT-D device placement.  Most recent echo in 3/19 with EF up to 40-45%.  NYHA class II symptoms, not volume overload by exam, weight is down.    - Continue Coreg 25 mg bid.  - Continue Entresto 97/103 bid.   - K and creatinine have been high, will not start spironolactone. Needs BMET today.  - Continue torsemide 10 mg daily.    - I will start him on dapagliflozin given DM + CHF.  I will repeat a BMET in 10 days.  - I will arrange for repeat echo.  2. Hyperlipidemia: Patient is on simvastatin.   - Check lipids today.   3. CKD: Stage 3.   -  BMET today.  4. Atrial flutter: s/p ablation, in NSR today.  5. Atrial fibrillation: Patient had afib noted as well as flutter.  Therefore, I think that he should stay on Xarelto long-term.  NSR today.  6. Normal pressure hydrocephalus: Has VP shunt.   7. OSA: Continue to use CPAP.  8. Type II diabetes: As above, will add dapagliflozin.   Followup in 4 months.  Loralie Champagne 01/13/2019

## 2019-01-13 NOTE — Patient Instructions (Signed)
START Justin Hill 10mg  (1 tab) daily  Labs today and REPEAT in 10 days We will only contact you if something comes back abnormal or we need to make some changes. Otherwise no news is good news!  Your physician has requested that you have an echocardiogram. Echocardiography is a painless test that uses sound waves to create images of your heart. It provides your doctor with information about the size and shape of your heart and how well your heart's chambers and valves are working. This procedure takes approximately one hour. There are no restrictions for this procedure.  THEY WILL CALL YOU TO SCHEDULE THIS APPOINTMENT.  Your physician recommends that you schedule a follow-up appointment in: 3 months with Dr. Aundra Dubin

## 2019-01-14 ENCOUNTER — Encounter (HOSPITAL_COMMUNITY): Payer: Self-pay

## 2019-01-17 ENCOUNTER — Telehealth (HOSPITAL_COMMUNITY): Payer: Self-pay

## 2019-01-17 NOTE — Telephone Encounter (Signed)
Prior authorization through [employer] was APPROVED for Justin Hill and will expire on 11/24/2019.

## 2019-01-17 NOTE — Telephone Encounter (Signed)
Prior authorization through [employer] company was initiated for farxiga medication and sent via [place] on 01/17/2019.

## 2019-01-19 ENCOUNTER — Other Ambulatory Visit (HOSPITAL_COMMUNITY): Payer: Self-pay | Admitting: Cardiology

## 2019-01-20 ENCOUNTER — Encounter: Payer: Self-pay | Admitting: Endocrinology

## 2019-01-20 NOTE — Telephone Encounter (Signed)
FYI.Marland KitchenMarland KitchenMarland KitchenAdditional info provided by pt.

## 2019-01-20 NOTE — Telephone Encounter (Signed)
Please advise 

## 2019-01-24 ENCOUNTER — Other Ambulatory Visit: Payer: Self-pay | Admitting: Cardiology

## 2019-01-24 ENCOUNTER — Ambulatory Visit (INDEPENDENT_AMBULATORY_CARE_PROVIDER_SITE_OTHER): Payer: Medicare Other

## 2019-01-24 DIAGNOSIS — E1122 Type 2 diabetes mellitus with diabetic chronic kidney disease: Secondary | ICD-10-CM | POA: Diagnosis not present

## 2019-01-24 DIAGNOSIS — Z794 Long term (current) use of insulin: Secondary | ICD-10-CM | POA: Diagnosis not present

## 2019-01-24 DIAGNOSIS — I5022 Chronic systolic (congestive) heart failure: Secondary | ICD-10-CM

## 2019-01-24 DIAGNOSIS — Z6835 Body mass index (BMI) 35.0-35.9, adult: Secondary | ICD-10-CM | POA: Diagnosis not present

## 2019-01-24 DIAGNOSIS — I509 Heart failure, unspecified: Secondary | ICD-10-CM | POA: Diagnosis not present

## 2019-01-24 DIAGNOSIS — Z95810 Presence of automatic (implantable) cardiac defibrillator: Secondary | ICD-10-CM

## 2019-01-24 DIAGNOSIS — N183 Chronic kidney disease, stage 3 (moderate): Secondary | ICD-10-CM | POA: Diagnosis not present

## 2019-01-25 LAB — BASIC METABOLIC PANEL
BUN/Creatinine Ratio: 9 — ABNORMAL LOW (ref 10–24)
BUN: 25 mg/dL (ref 8–27)
CO2: 22 mmol/L (ref 20–29)
Calcium: 8.7 mg/dL (ref 8.6–10.2)
Chloride: 102 mmol/L (ref 96–106)
Creatinine, Ser: 2.9 mg/dL — ABNORMAL HIGH (ref 0.76–1.27)
GFR calc Af Amer: 23 mL/min/{1.73_m2} — ABNORMAL LOW (ref >59)
GFR calc non Af Amer: 20 mL/min/{1.73_m2} — ABNORMAL LOW (ref >59)
Glucose: 91 mg/dL (ref 65–99)
Potassium: 3.9 mmol/L (ref 3.5–5.2)
Sodium: 140 mmol/L (ref 134–144)

## 2019-01-25 LAB — SPECIMEN STATUS REPORT

## 2019-01-25 NOTE — Progress Notes (Signed)
EPIC Encounter for ICM Monitoring  Patient Name: Justin Hill is a 78 y.o. male Date: 01/25/2019 Primary Care Physican: Serita Grammes, MD Primary Cardiologist:Mc[facility] Electrophysiologist: Allred Bi-V Pacing: 97.6% Last Weight:250lbs Today's Weight:unknown   Attempted call to wife and unable to reach.  Left detailed message per DPR regarding transmission. Transmission reviewed.   Thoracic impedancenormal  Prescribed:Torsemide10 mg1tablet(10 mg total)daily.    Labs: 11/23/2019 Creatinine 1.66, BUN 19, Potassium 4.5, Sodium 139, eGFR 40-46 08/24/2018 Creatinine 1.57, BUN 15, Potassium 5.0, Sodium 139, eGFR 42-49 08/13/2018 Creatinine 2.3, Potassium 5.2, Sodium 137 results given by wife that were drawn at [facility] ER 05/04/2018 Creatinine 1.53, BUN 10, Potassium 5.2, Sodium 138, EGFR 43-50 02/01/2018 Creatinine 1.42, BUN 10, Potassium 4.5, Sodium 137, EGFR 47-55  Recommendations:Left voice mail with ICM number and encouraged to call if experiencing any fluid symptoms.  Follow-up plan: ICM clinic phone appointment on4/04/2019. Office visit with Dr Rayann Heman 03/07/2019.  Copy of ICM check sent to Dr.Allred.  3 month ICM trend: 01/24/2019    1 Year ICM trend:       Rosalene Billings, RN 01/25/2019 4:49 PM

## 2019-01-26 ENCOUNTER — Telehealth (HOSPITAL_COMMUNITY): Payer: Self-pay | Admitting: Cardiology

## 2019-01-26 NOTE — Telephone Encounter (Signed)
Abnormal lab results received  Labs drawn 01/24/19  K 3.9 Cr 2.90 Na 140  Per Ronnell [place], [place] Confirm patient is not taking torsemide and farxiga Will need b/p check ASAP   Patient aware of results via wife Wife reports patient has been dealing with GI bug since 3/1 (N,V,D) B/P 3/2 -82/50 3/3-120/76 Wife confirmed he is NOT taking torsemide and never started [place] -wife requested to call 3/6 with a b/p reading  Above discussed with Rebecca Eaton Agreeable, hold entresto x 2 doses

## 2019-01-27 ENCOUNTER — Other Ambulatory Visit (HOSPITAL_COMMUNITY): Payer: Self-pay

## 2019-01-27 ENCOUNTER — Telehealth: Payer: Self-pay | Admitting: Endocrinology

## 2019-01-27 NOTE — Telephone Encounter (Signed)
Please verify insulin is 90 units in the morning and 80 units each evening.  Then please reduce the PM insulin to 70 units.  Please call or message [place] next week, to tell us how the blood sugar is doing

## 2019-01-27 NOTE — Telephone Encounter (Signed)
Left detailed message informing of new orders. Advised to call with any questions

## 2019-01-27 NOTE — Telephone Encounter (Signed)
Please advise 

## 2019-01-27 NOTE — Telephone Encounter (Signed)
Patient's blood sugars yesterday were 75 at 3 a.m. and woke up in a sweat. This morning Blood sugars were 62 (right now 171). Patient has a virus Sunday & Monday (went PCP).  Please call patient's spouse [name] ph# [phone] to advise.

## 2019-01-27 NOTE — Telephone Encounter (Signed)
Pt states he self adjusted PM insulin to 70 units and is still having low CBG's. Please advise about PM insulin dosage changes based on this new information

## 2019-01-27 NOTE — Telephone Encounter (Signed)
OK.  Please reduce to 60 units with supper.

## 2019-01-28 ENCOUNTER — Telehealth (HOSPITAL_COMMUNITY): Payer: Self-pay

## 2019-01-28 ENCOUNTER — Other Ambulatory Visit (HOSPITAL_COMMUNITY): Payer: Self-pay | Admitting: Cardiology

## 2019-01-28 MED ORDER — SACUBITRIL-VALSARTAN 24-26 MG PO TABS: 1.0000 | ORAL_TABLET | Freq: Two times a day (BID) | ORAL | 3 refills | Status: DC

## 2019-01-28 NOTE — Telephone Encounter (Signed)
-----   Message from Larey Dresser, MD sent at 01/27/2019  9:37 PM EST ----- Creatinine continues to rise.  Hold Entresto for 3 days and decrease dose to 24/26 bid.  Repeat BMET in 1 week.

## 2019-01-28 NOTE — Telephone Encounter (Signed)
Relayed message to wife who verbalized understanding, will stop Entresto 3x days and pick up lower dose that I have sent to pharm. Will follow up BMET at [facility] in [place]. (order faxed)

## 2019-01-31 DIAGNOSIS — Z89421 Acquired absence of other right toe(s): Secondary | ICD-10-CM | POA: Diagnosis not present

## 2019-01-31 DIAGNOSIS — L84 Corns and callosities: Secondary | ICD-10-CM | POA: Diagnosis not present

## 2019-01-31 DIAGNOSIS — B351 Tinea unguium: Secondary | ICD-10-CM | POA: Diagnosis not present

## 2019-01-31 DIAGNOSIS — L603 Nail dystrophy: Secondary | ICD-10-CM | POA: Diagnosis not present

## 2019-01-31 DIAGNOSIS — E1142 Type 2 diabetes mellitus with diabetic polyneuropathy: Secondary | ICD-10-CM | POA: Diagnosis not present

## 2019-02-02 ENCOUNTER — Encounter (HOSPITAL_COMMUNITY): Payer: Self-pay

## 2019-02-07 ENCOUNTER — Other Ambulatory Visit: Payer: Self-pay | Admitting: Cardiology

## 2019-02-07 DIAGNOSIS — I5022 Chronic systolic (congestive) heart failure: Secondary | ICD-10-CM | POA: Diagnosis not present

## 2019-02-08 LAB — BASIC METABOLIC PANEL
BUN/Creatinine Ratio: 7 — ABNORMAL LOW (ref 10–24)
BUN: 12 mg/dL (ref 8–27)
CO2: 29 mmol/L (ref 20–29)
Calcium: 9.4 mg/dL (ref 8.6–10.2)
Chloride: 103 mmol/L (ref 96–106)
Creatinine, Ser: 1.62 mg/dL — ABNORMAL HIGH (ref 0.76–1.27)
GFR calc Af Amer: 47 mL/min/{1.73_m2} — ABNORMAL LOW (ref >59)
GFR calc non Af Amer: 41 mL/min/{1.73_m2} — ABNORMAL LOW (ref >59)
Glucose: 143 mg/dL — ABNORMAL HIGH (ref 65–99)
Potassium: 4.9 mmol/L (ref 3.5–5.2)
Sodium: 140 mmol/L (ref 134–144)

## 2019-02-08 LAB — SPECIMEN STATUS REPORT

## 2019-02-09 ENCOUNTER — Other Ambulatory Visit (HOSPITAL_COMMUNITY): Payer: Self-pay | Admitting: Cardiology

## 2019-02-14 ENCOUNTER — Telehealth: Payer: Self-pay | Admitting: Cardiology

## 2019-02-14 NOTE — Telephone Encounter (Signed)
Spoke w/ pt wife and she stated that she sends manual transmissions when the monitor turns on. I instructed her not to worry about the monitor if it happens again that it will go back to sleep after several minutes. Only to send manual transmissions when we call and ask her to. Pt wife verbalized understanding.

## 2019-02-21 ENCOUNTER — Other Ambulatory Visit: Payer: Self-pay

## 2019-02-21 ENCOUNTER — Ambulatory Visit (INDEPENDENT_AMBULATORY_CARE_PROVIDER_SITE_OTHER): Payer: Medicare Other | Admitting: *Deleted

## 2019-02-21 DIAGNOSIS — I5022 Chronic systolic (congestive) heart failure: Secondary | ICD-10-CM | POA: Diagnosis not present

## 2019-02-21 DIAGNOSIS — I428 Other cardiomyopathies: Secondary | ICD-10-CM

## 2019-02-21 LAB — CUP PACEART REMOTE DEVICE CHECK
Battery Remaining Longevity: 28 mo
Battery Voltage: 2.95 V
Brady Statistic AP VP Percent: 0.15 %
Brady Statistic AS VP Percent: 97.44 %
Brady Statistic RA Percent Paced: 0.16 %
Brady Statistic RV Percent Paced: 11.33 %
Date Time Interrogation Session: 20200330113823
HighPow Impedance: 65 Ohm
Implantable Lead Implant Date: 20170713
Implantable Lead Implant Date: 20170713
Implantable Lead Implant Date: 20170713
Implantable Lead Location: 753858
Implantable Lead Location: 753859
Implantable Lead Location: 753860
Implantable Lead Model: 5076
Implantable Pulse Generator Implant Date: 20170713
Lead Channel Impedance Value: 342 Ohm
Lead Channel Impedance Value: 342 Ohm
Lead Channel Impedance Value: 361 Ohm
Lead Channel Impedance Value: 361 Ohm
Lead Channel Impedance Value: 361 Ohm
Lead Channel Impedance Value: 399 Ohm
Lead Channel Impedance Value: 456 Ohm
Lead Channel Impedance Value: 475 Ohm
Lead Channel Impedance Value: 551 Ohm
Lead Channel Impedance Value: 551 Ohm
Lead Channel Impedance Value: 703 Ohm
Lead Channel Impedance Value: 703 Ohm
Lead Channel Impedance Value: 722 Ohm
Lead Channel Pacing Threshold Amplitude: 0.5 V
Lead Channel Pacing Threshold Amplitude: 3.25 V
Lead Channel Pacing Threshold Pulse Width: 0.4 ms
Lead Channel Pacing Threshold Pulse Width: 0.4 ms
Lead Channel Pacing Threshold Pulse Width: 0.8 ms
Lead Channel Sensing Intrinsic Amplitude: 0.625 mV
Lead Channel Sensing Intrinsic Amplitude: 0.625 mV
Lead Channel Sensing Intrinsic Amplitude: 9.875 mV
Lead Channel Sensing Intrinsic Amplitude: 9.875 mV
Lead Channel Setting Pacing Amplitude: 2 V
Lead Channel Setting Pacing Amplitude: 2.5 V
Lead Channel Setting Pacing Amplitude: 4.25 V
Lead Channel Setting Pacing Pulse Width: 0.4 ms
Lead Channel Setting Sensing Sensitivity: 0.3 mV
MDC IDC MSMT LEADCHNL RV PACING THRESHOLD AMPLITUDE: 0.625 V
MDC IDC SET LEADCHNL LV PACING PULSEWIDTH: 0.8 ms
MDC IDC STAT BRADY AP VS PERCENT: 0.01 %
MDC IDC STAT BRADY AS VS PERCENT: 2.4 %

## 2019-02-28 ENCOUNTER — Other Ambulatory Visit: Payer: Self-pay

## 2019-02-28 ENCOUNTER — Ambulatory Visit (INDEPENDENT_AMBULATORY_CARE_PROVIDER_SITE_OTHER): Payer: Medicare Other

## 2019-02-28 DIAGNOSIS — Z95810 Presence of automatic (implantable) cardiac defibrillator: Secondary | ICD-10-CM | POA: Diagnosis not present

## 2019-02-28 DIAGNOSIS — I5022 Chronic systolic (congestive) heart failure: Secondary | ICD-10-CM

## 2019-03-01 ENCOUNTER — Telehealth: Payer: Self-pay

## 2019-03-01 NOTE — Telephone Encounter (Signed)
Remote ICM transmission received.  Attempted call to patient regarding ICM remote transmission and left message, per DPR, to return call.    

## 2019-03-01 NOTE — Progress Notes (Signed)
EPIC Encounter for ICM Monitoring  Patient Name: Justin Hill is a 78 y.o. male Date: 03/01/2019 Primary Care Physican: Serita Grammes, MD Primary Cardiologist:McLean Electrophysiologist: Allred Bi-V Pacing: 98.4% Last Weight:250lbs 03/01/2019 Weight:unknown   Attempted call to wife and unable to reach.  Left message to return call per DPR regarding transmission. Transmission reviewed.   Thoracic impedanceabnormal suggesting fluid accumulation since 01/23/2019.  Prescribed:Torsemidediscontinued 01/13/2019  Labs: 02/07/2019 Creatinine 1.62, BUN 12, Potassium 4.9, Sodium 140, GFR 41-47 01/24/2019 Creatinine 2.90, BUN 25, Potassium 3.9, Sodium 140, GFR 20-23  01/13/2019 Creatinine 2.30, BUN 21, Potassium 4.5, Sodium 136, GFR 27-31  A complete set of results can be found in Results Review.  Recommendations:Left voice mail with ICM number to return call.  Follow-up plan: ICM clinic phone appointment on4/14/2020 to recheck fluid levels.  Copy of ICM check sent to Dr Rayann Heman and Dr Aundra Dubin for review and recommendations if needed.       3 month ICM trend: 02/28/2019    1 Year ICM trend:       Rosalene Billings, RN 03/01/2019 3:37 PM

## 2019-03-02 NOTE — Progress Notes (Signed)
Take torsemide 20 mg daily x 2 days, then 10 mg daily x 5 days, then 10 mg every other day.  BMET 10 days.

## 2019-03-02 NOTE — Progress Notes (Signed)
Attempted call to wife to provide Dr Claris Gladden recommendations.  Left message to return call.

## 2019-03-02 NOTE — Progress Notes (Signed)
Remote ICD transmission.   

## 2019-03-04 ENCOUNTER — Telehealth: Payer: Self-pay

## 2019-03-04 MED ORDER — TORSEMIDE 10 MG PO TABS: 10.0000 mg | ORAL_TABLET | ORAL | 3 refills | Status: DC

## 2019-03-04 NOTE — Telephone Encounter (Signed)
Call to [place] on [facility] and confirmed fax number of [phone]. Will fax BMET ordered by Dr Aundra Dubin (see 02/28/2019 ICM note).

## 2019-03-04 NOTE — Progress Notes (Signed)
Returned wife's call as requested by voice mail message.  She said patient is symptomatic for fluid accumulation. He has gained 10 lbs within last 2 weeks and feet are swollen.   Weight today 260 lbs (baseline 250 lbs).    Advised Dr Aundra Dubin ordered to restart Torsemide as follows: Take torsemide 20 mg daily x 2 days, then 10 mg daily x 5 days, then 10 mg every other day.  BMET 10 days.   She currently has about 14 pills from previous Torsemide script which were 10 mg tablets so he can take the 7 days of varying dosages.  Advised will send new Torsemide script for 10 mg every other day and she confirmed [facility] in [place] is preferred pharmacy.  She verbalized understanding of medication instructions and repeated back correctly.  She agreed to [facility] for patient to be drawn 4/20 at [facility] on [address].  Will fax order today.    Remote transmission changed from 4/14 to 4/20.

## 2019-03-07 ENCOUNTER — Encounter: Payer: Medicare Other | Admitting: Internal Medicine

## 2019-03-10 ENCOUNTER — Encounter: Payer: Self-pay | Admitting: Endocrinology

## 2019-03-10 NOTE — Telephone Encounter (Signed)
Please advise 

## 2019-03-11 ENCOUNTER — Encounter: Payer: Self-pay | Admitting: Endocrinology

## 2019-03-11 ENCOUNTER — Other Ambulatory Visit: Payer: Self-pay

## 2019-03-11 ENCOUNTER — Ambulatory Visit (INDEPENDENT_AMBULATORY_CARE_PROVIDER_SITE_OTHER): Payer: Medicare Other | Admitting: Endocrinology

## 2019-03-11 DIAGNOSIS — N183 Chronic kidney disease, stage 3 (moderate): Principal | ICD-10-CM

## 2019-03-11 DIAGNOSIS — Z794 Long term (current) use of insulin: Principal | ICD-10-CM

## 2019-03-11 DIAGNOSIS — E1122 Type 2 diabetes mellitus with diabetic chronic kidney disease: Secondary | ICD-10-CM

## 2019-03-11 MED ORDER — INSULIN NPH ISOPHANE & REGULAR (70-30) 100 UNIT/ML ~~LOC~~ SUSP: SUBCUTANEOUS | 11 refills | Status: DC

## 2019-03-11 NOTE — Telephone Encounter (Signed)
This will be addressed during today's virtual visit.

## 2019-03-11 NOTE — Progress Notes (Addendum)
Subjective:    Patient ID: Justin Hill, male    DOB: Mar 18, 1941, 78 y.o.   MRN: [id number]  HPI  telehealth visit today via doxy video visit.  Alternatives to telehealth are presented to this patient, and the patient agrees to the telehealth visit. Pt is advised of the cost of the visit, and agrees to this, also.   Patient is at home, and I am at the office.   Pt returns for f/u of diabetes mellitus: DM type: Insulin-requiring type 2. Dx'ed: 1497 Complications: CHF, toe ulcer, partial toe amputation, polyneuropathy, and renal insufficiency.  Therapy: insulin since 2012.  DKA: never.   Severe hypoglycemia: never.    Pancreatitis: never.     Other: he takes human insulin, due to cost; in October of 2014, he was changed from multiple daily injections to BID, due to persistently high a1c.  wife gives insulin, due to his memory loss.    Interval history: wife states cbg's vary from 170-300's.  It is in general higher as the day goes on.  No new sxs.  No recent steroids.  Wife is unable to say why cbg has increased.   Past Medical History:  Diagnosis Date  . Altered mental status   . Cardiomyopathy   . CHF (congestive heart failure) ([place])   . Chronic kidney disease (CKD), stage III (moderate) (H[facility])   . Communicating hydrocephalus ([place])   . Diabetes mellitus    type 2  . Diverticulosis   . History of kidney stones   . Hypercholesterolemia   . Hyperglycemia   . Hypertension   . Paroxysmal atrial fibrillation (HCC)   . Personal history of kidney stones   . Pneumonia   . Sleep apnea    uses cpap    Past Surgical History:  Procedure Laterality Date  . ATRIAL FLUTTER ABLATION N/A 01/19/2015   Procedure: ATRIAL FLUTTER ABLATION;  Surgeon: [name], MD;  Location: [facility] CATH LAB;  Service: Cardiovascular;  Laterality: N/A;  . CARDIAC CATHETERIZATION N/A 05/02/2016   Procedure: Right/Left Heart Cath and Coronary Angiography;  Surgeon: Larey Dresser, MD;  Location: [facility] CV  LAB;  Service: Cardiovascular;  Laterality: N/A;  . COLONOSCOPY N/A 12/29/2013   Procedure: COLONOSCOPY;  Surgeon: Lafayette Dragon, MD;  Location: [facility] ENDOSCOPY;  Service: Endoscopy;  Laterality: N/A;  . EP IMPLANTABLE DEVICE N/A 06/05/2016   MDT Hillery Aldo XT CRTD implanted by Dr Rayann Heman   . EYE SURGERY     cataract surgery (not sure which eye)  . [place]   left. Muscle reattachment  . TOE SURGERY Right 12/2012   2nd toe  . TONSILLECTOMY    . VENTRICULOPERITONEAL SHUNT Right 11/20/2017   Procedure: SHUNT PLACEMENT RIGHT;  Surgeon: Newman Pies, MD;  Location: [place];  Service: Neurosurgery;  Laterality: Right;  SHUNT PLACEMENT RIGHT    Social History   Socioeconomic History  . Marital status: Married    Spouse name: Rosemarie Beath  . Number of children: 1  . Years of education: 78  . Highest education level: Not on file  Occupational History  . Occupation: retired  Scientific laboratory technician  . Financial resource strain: Not on file  . Food insecurity:    Worry: Not on file    Inability: Not on file  . Transportation needs:    Medical: Not on file    Non-medical: Not on file  Tobacco Use  . Smoking status: Former Smoker    Types: Cigarettes  .  Smokeless tobacco: Never Used  Substance and Sexual Activity  . Alcohol use: No  . Drug use: No  . Sexual activity: Not on file  Lifestyle  . Physical activity:    Days per week: Not on file    Minutes per session: Not on file  . Stress: Not on file  Relationships  . Social connections:    Talks on phone: Not on file    Gets together: Not on file    Attends religious service: Not on file    Active member of club or organization: Not on file    Attends meetings of clubs or organizations: Not on file    Relationship status: Not on file  . Intimate partner violence:    Fear of current or ex partner: Not on file    Emotionally abused: Not on file    Physically abused: Not on file    Forced sexual activity: Not on file  Other  Topics Concern  . Not on file  Social History Narrative   Regular exercise-no   Rare caffeine use     Current Outpatient Medications on File Prior to Visit  Medication Sig Dispense Refill  . Ascorbic Acid (VITAMIN C) 1000 MG tablet Take 1 tablet by mouth 2 (two) times daily.     . carvedilol (COREG) 25 MG tablet TAKE 1 TABLET BY MOUTH TWICE DAILY WITH MEALS 180 tablet 3  . fenofibrate 54 MG tablet Take 1 tablet (54 mg total) by mouth daily. 90 tablet 3  . loratadine (CLARITIN) 10 MG tablet Take 1 tablet by mouth 2 (two) times daily.     . Multiple Vitamin (MULTIVITAMIN) tablet Take 1 tablet by mouth daily.      Marland Kitchen omeprazole (PRILOSEC) 40 MG capsule Take 40 mg by mouth daily. OTC    . sacubitril-valsartan (ENTRESTO) 24-26 MG Take 1 tablet by mouth 2 (two) times daily. 60 tablet 3  . simvastatin (ZOCOR) 40 MG tablet Take 40 mg by mouth at bedtime.      . tadalafil (CIALIS) 5 MG tablet Take 5 mg by mouth daily.  3  . tamsulosin (FLOMAX) 0.4 MG CAPS capsule Take 0.4 mg by mouth daily.    Marland Kitchen torsemide (DEMADEX) 10 MG tablet Take 1 tablet (10 mg total) by mouth every other day. 45 tablet 3  . XARELTO 20 MG TABS tablet TAKE 1 TABLET BY MOUTH ONCE DAILY WITH  SUPPER 90 tablet 3   No current facility-administered medications on file prior to visit.     Allergies  Allergen Reactions  . Penicillins Anaphylaxis and Other (See Comments)    SYNCOPE PATIENT HAS HAD A PCN REACTION WITH IMMEDIATE RASH, FACIAL/TONGUE/THROAT SWELLING, SOB, OR LIGHTHEADEDNESS WITH HYPOTENSION:  #  #  #  YES  #  #  #   Has patient had a PCN reaction causing severe rash involving mucus membranes or skin necrosis: no PATIENT HAS HAD A PCN REACTION THAT REQUIRED HOSPITALIZATION:  #  #  #  YES  #  #  #  Has patient had a PCN reaction occurring within the last 10 years: no    . Aspirin Other (See Comments)    Burps, burning, stomach pains, etc  . Lisinopril Cough    Severe coughing    Family History  Problem Relation  Age of Onset  . Heart failure Maternal Grandmother   . Diabetes type II Sister 25  . Cancer Sister   . Hyperlipidemia Other  Parent  . Stroke Other        Parent  . Hypertension Other        Parent  . Diabetes Other        Parent  . Colon cancer Neg Hx   . Heart attack Neg Hx     Review of Systems He denies hypoglycemia.  He has gained weight.      Objective:   Physical Exam      Assessment & Plan:  Insulin-requiring type 2 DM, with PN: he needs increased rx.  Renal insuff: in this setting, he needs more am insulin than pm.  Memory loss: he is not a candidate for multiple daily injections.   Patient Instructions  Please increase the same insulin to 110 units with breakfast, and 60 units with supper.  On this type of insulin schedule, you should eat meals on a regular schedule.  If a meal is missed or significantly.  delayed, your blood sugar could go low.   check your blood sugar twice a day.  vary the time of day when you check, between before the 3 meals, and at bedtime.  also check if you have symptoms of your blood sugar being too high or too low.  please keep a record of the readings and bring it to your next appointment here.  please call us sooner if your blood sugar goes below 70, or if you have a lot of readings over 200.   If you have any blood sugar under 80, please write on the paper why you think that was.   Please do the a1c at [facility] in .   Please come back for a follow-up appointment in 2 months.

## 2019-03-11 NOTE — Patient Instructions (Signed)
Please increase the same insulin to 110 units with breakfast, and 60 units with supper.  On this type of insulin schedule, you should eat meals on a regular schedule.  If a meal is missed or significantly.  delayed, your blood sugar could go low.   check your blood sugar twice a day.  vary the time of day when you check, between before the 3 meals, and at bedtime.  also check if you have symptoms of your blood sugar being too high or too low.  please keep a record of the readings and bring it to your next appointment here.  please call us sooner if your blood sugar goes below 70, or if you have a lot of readings over 200.   If you have any blood sugar under 80, please write on the paper why you think that was.   Please do the a1c at [facility] in [place].   Please come back for a follow-up appointment in 2 months.

## 2019-03-13 ENCOUNTER — Encounter: Payer: Self-pay | Admitting: Endocrinology

## 2019-03-14 ENCOUNTER — Other Ambulatory Visit: Payer: Self-pay | Admitting: Cardiology

## 2019-03-14 ENCOUNTER — Other Ambulatory Visit: Payer: Self-pay

## 2019-03-14 DIAGNOSIS — N183 Chronic kidney disease, stage 3 (moderate): Secondary | ICD-10-CM | POA: Diagnosis not present

## 2019-03-14 DIAGNOSIS — Z794 Long term (current) use of insulin: Secondary | ICD-10-CM | POA: Diagnosis not present

## 2019-03-14 DIAGNOSIS — E1122 Type 2 diabetes mellitus with diabetic chronic kidney disease: Secondary | ICD-10-CM | POA: Diagnosis not present

## 2019-03-14 DIAGNOSIS — I5022 Chronic systolic (congestive) heart failure: Secondary | ICD-10-CM | POA: Diagnosis not present

## 2019-03-15 LAB — BASIC METABOLIC PANEL
BUN/Creatinine Ratio: 10 (ref 10–24)
BUN: 17 mg/dL (ref 8–27)
CO2: 29 mmol/L (ref 20–29)
Calcium: 9.7 mg/dL (ref 8.6–10.2)
Chloride: 100 mmol/L (ref 96–106)
Creatinine, Ser: 1.73 mg/dL — ABNORMAL HIGH (ref 0.76–1.27)
GFR calc Af Amer: 43 mL/min/{1.73_m2} — ABNORMAL LOW (ref >59)
GFR calc non Af Amer: 38 mL/min/{1.73_m2} — ABNORMAL LOW (ref >59)
Glucose: 284 mg/dL — ABNORMAL HIGH (ref 65–99)
Potassium: 5 mmol/L (ref 3.5–5.2)
Sodium: 139 mmol/L (ref 134–144)

## 2019-03-15 LAB — HEMOGLOBIN A1C
Est. average glucose Bld gHb Est-mCnc: 200 mg/dL
Hgb A1c MFr Bld: 8.6 % — ABNORMAL HIGH (ref 4.8–5.6)

## 2019-03-15 LAB — SPECIMEN STATUS REPORT

## 2019-03-16 ENCOUNTER — Other Ambulatory Visit (HOSPITAL_COMMUNITY): Payer: Self-pay | Admitting: Cardiology

## 2019-03-17 ENCOUNTER — Ambulatory Visit: Payer: Medicare Other | Admitting: Endocrinology

## 2019-03-18 ENCOUNTER — Telehealth: Payer: Self-pay

## 2019-03-18 NOTE — Telephone Encounter (Signed)
Spoke with patient to remind of missed remote transmission 

## 2019-03-21 ENCOUNTER — Telehealth: Payer: Self-pay

## 2019-03-21 ENCOUNTER — Other Ambulatory Visit: Payer: Self-pay

## 2019-03-21 ENCOUNTER — Ambulatory Visit (INDEPENDENT_AMBULATORY_CARE_PROVIDER_SITE_OTHER): Payer: Medicare Other

## 2019-03-21 DIAGNOSIS — I5022 Chronic systolic (congestive) heart failure: Secondary | ICD-10-CM

## 2019-03-21 DIAGNOSIS — Z95810 Presence of automatic (implantable) cardiac defibrillator: Secondary | ICD-10-CM

## 2019-03-21 NOTE — Progress Notes (Signed)
EPIC Encounter for ICM Monitoring  Patient Name: Justin Hill is a 78 y.o. male Date: 03/21/2019 Primary Care Physican: Serita Grammes, MD Primary Cardiologist:Mc[facility] Electrophysiologist: Allred Bi-V Pacing: 99.3% 02/28/2019 Weight:260lbs 03/21/2019 Weight:unknown   Attempted call towifeand unable to reach. Left message to return call per DPR regarding transmission. Transmission reviewed.   Thoracic impedancereturned close to normal since last 02/28/2019 transmission.  Prescribed:Torsemide10 mg take 1 tablet every other day  Labs: 02/07/2019 Creatinine 1.62, BUN 12, Potassium 4.9, Sodium 140, GFR 41-47 01/24/2019 Creatinine 2.90, BUN 25, Potassium 3.9, Sodium 140, GFR 20-23  01/13/2019 Creatinine 2.30, BUN 21, Potassium 4.5, Sodium 136, GFR 27-31  A complete set of results can be found in Results Review.  Recommendations:Left voice mail with ICM number to return call.  Follow-up plan: ICM clinic phone appointment on5/20/2020.  Visit with Dr Aundra Dubin scheduled 04/14/2019.  Copy of ICM check sent to Dr Rayann Heman.      3 month ICM trend: 03/21/2019    1 Year ICM trend:       Rosalene Billings, RN 03/21/2019 10:19 AM

## 2019-03-21 NOTE — Telephone Encounter (Signed)
Remote ICM transmission received.  Attempted call to wife regarding ICM remote transmission and left message, per DPR, to return call.

## 2019-03-22 NOTE — Progress Notes (Signed)
Spoke with wife today.  Patient's symptomatic with weight increase of 10 lbs in the last 2 weeks again following temporary Torsemide dosage increase of 20 mg daily x 2 days, then 10 mg daily x 5 days as recommended by Dr Aundra Dubin at 02/28/2019 ICM review.  No lower extremity swelling and no change in breathing.   Patient does have a sinus congestion and she thinks a cold has settled in his head and chest causing some congestion in his chest.  Advised to call PCP regarding sinus/cold.   Will send copy of report to Dr Aundra Dubin for review.  Advised will call back with any recommendations.  Verified pt taking Torsemide 10 mg every other day since 03/11/2019.    Optivol shows impedance returned to normal after the temporary Torsemide dosage increase on 4/6 but impedance decreased again from 4/13 - 4/23 but closer to normal 03/21/2019.

## 2019-03-22 NOTE — Progress Notes (Signed)
Have him take torsemide 20 mg daily with BMET in 10 days.

## 2019-03-23 MED ORDER — TORSEMIDE 20 MG PO TABS: 20.0000 mg | ORAL_TABLET | Freq: Every day | ORAL | 3 refills | Status: DC

## 2019-03-23 NOTE — Progress Notes (Signed)
Attempted call to wife to provide Dr Oleh Genin recommendations. Left message for call back.   Pt has labs drawn at [facility] on [facility].  Will fax BMET order to [phone].

## 2019-03-23 NOTE — Progress Notes (Signed)
Spoke with wife.  Advised Dr Aundra Dubin recommended patient take Torsemide 20 mg daily and BMET in 10 days which will be 04/04/2019.  She currently has 10 mg Torsemide tablets and advised he will need to take 2 tabs to = 20 mg daily until she has a new script filled. She requested to have 20 mg tablets for the new prescription today. She verbalized understanding of the instructions.  She has enough Torsemide on hand and does not need a refill at this time.  Pt will have labs drawn at [place] on [facility].  Will fax BMET order to [phone] and have results faxed to Dr Claris Gladden office.

## 2019-03-30 ENCOUNTER — Other Ambulatory Visit: Payer: Self-pay

## 2019-03-30 ENCOUNTER — Ambulatory Visit (INDEPENDENT_AMBULATORY_CARE_PROVIDER_SITE_OTHER): Payer: Medicare Other

## 2019-03-30 DIAGNOSIS — I5022 Chronic systolic (congestive) heart failure: Secondary | ICD-10-CM

## 2019-03-30 DIAGNOSIS — Z95810 Presence of automatic (implantable) cardiac defibrillator: Secondary | ICD-10-CM

## 2019-04-01 NOTE — Progress Notes (Signed)
EPIC Encounter for ICM Monitoring  Patient Name: Justin Hill is a 78 y.o. male Date: 04/01/2019 Primary Care Physican: Serita Grammes, MD Primary Cardiologist:McLean Electrophysiologist: Allred Bi-V Pacing: 98% 01/24/2019 Weight: 250 bs (baseline) 02/28/2019 Weight:260lbs 5/08/2020Weight:260 lbs   Spoke with wife.  She reports weight remains 260 lbs approximately 10 lbs over baseline but says he feels fine.  Denies any lower extremity swelling or breathing difficulties.   Thoracic impedancereturned close to normal since last 02/28/2019 transmission.  Prescribed:Torsemide20 mg take 1 tablet daily(increased on 03/23/2019).  Labs:  BMET scheduled for 5/11 at [facility] [place] in [facility] 02/07/2019 Creatinine1.62, BUN12, Potassium4.9, [place], [id number] 01/24/2019 Creatinine2.90, BUN25, Potassium3.9, [place], GFR20-23  01/13/2019 Creatinine2.30, BUN21, Potassium4.5, Sodium136, [id number] A complete set of results can be found in Results Review.  Recommendations:Left voice mail with ICM numberto return call.  Follow-up plan: ICM clinic phone appointment on5/20/2020.  Visit with Dr Aundra Dubin scheduled 04/14/2019.  Copy of ICM check sent to Dr Rayann Heman.  3 month ICM trend: 04/01/2019    1 Year ICM trend:       Rosalene Billings, RN 04/01/2019 1:48 PM

## 2019-04-04 ENCOUNTER — Other Ambulatory Visit: Payer: Self-pay | Admitting: Cardiology

## 2019-04-04 DIAGNOSIS — I5022 Chronic systolic (congestive) heart failure: Secondary | ICD-10-CM | POA: Diagnosis not present

## 2019-04-04 LAB — BASIC METABOLIC PANEL
BUN/Creatinine Ratio: 11 (ref 10–24)
BUN: 23 mg/dL (ref 8–27)
CO2: 32 mmol/L — ABNORMAL HIGH (ref 20–29)
Calcium: 9.2 mg/dL (ref 8.6–10.2)
Chloride: 101 mmol/L (ref 96–106)
Creatinine, Ser: 2.04 mg/dL — ABNORMAL HIGH (ref 0.76–1.27)
GFR calc Af Amer: 36 mL/min/{1.73_m2} — ABNORMAL LOW (ref >59)
GFR calc non Af Amer: 31 mL/min/{1.73_m2} — ABNORMAL LOW (ref >59)
Glucose: 264 mg/dL — ABNORMAL HIGH (ref 65–99)
Potassium: 5.3 mmol/L — ABNORMAL HIGH (ref 3.5–5.2)
Sodium: 140 mmol/L (ref 134–144)

## 2019-04-04 LAB — SPECIMEN STATUS REPORT

## 2019-04-05 ENCOUNTER — Other Ambulatory Visit (HOSPITAL_COMMUNITY): Payer: Self-pay

## 2019-04-05 ENCOUNTER — Telehealth (HOSPITAL_COMMUNITY): Payer: Self-pay

## 2019-04-05 MED ORDER — TORSEMIDE 10 MG PO TABS: ORAL_TABLET | ORAL | 3 refills | Status: DC

## 2019-04-05 NOTE — Telephone Encounter (Signed)
Relayed information to wife, confirmed 24/26 Entresto dose, verbalized torsemide changes. Faxed lap scrip to [facility] in [place] per pt request and will have then drawn in 1 week.

## 2019-04-11 DIAGNOSIS — L853 Xerosis cutis: Secondary | ICD-10-CM | POA: Diagnosis not present

## 2019-04-11 DIAGNOSIS — L300 Nummular dermatitis: Secondary | ICD-10-CM | POA: Diagnosis not present

## 2019-04-11 DIAGNOSIS — L299 Pruritus, unspecified: Secondary | ICD-10-CM | POA: Diagnosis not present

## 2019-04-12 ENCOUNTER — Other Ambulatory Visit: Payer: Self-pay | Admitting: Cardiology

## 2019-04-12 ENCOUNTER — Other Ambulatory Visit (HOSPITAL_COMMUNITY): Payer: Medicare Other

## 2019-04-12 DIAGNOSIS — I509 Heart failure, unspecified: Secondary | ICD-10-CM | POA: Diagnosis not present

## 2019-04-12 LAB — BASIC METABOLIC PANEL
BUN/Creatinine Ratio: 12 (ref 10–24)
BUN: 22 mg/dL (ref 8–27)
CO2: 28 mmol/L (ref 20–29)
Calcium: 9.7 mg/dL (ref 8.6–10.2)
Chloride: 103 mmol/L (ref 96–106)
Creatinine, Ser: 1.89 mg/dL — ABNORMAL HIGH (ref 0.76–1.27)
GFR calc Af Amer: 39 mL/min/{1.73_m2} — ABNORMAL LOW (ref >59)
GFR calc non Af Amer: 34 mL/min/{1.73_m2} — ABNORMAL LOW (ref >59)
Glucose: 257 mg/dL — ABNORMAL HIGH (ref 65–99)
Potassium: 4.8 mmol/L (ref 3.5–5.2)
Sodium: 139 mmol/L (ref 134–144)

## 2019-04-12 LAB — SPECIMEN STATUS REPORT

## 2019-04-13 ENCOUNTER — Other Ambulatory Visit: Payer: Self-pay

## 2019-04-13 ENCOUNTER — Ambulatory Visit (INDEPENDENT_AMBULATORY_CARE_PROVIDER_SITE_OTHER): Payer: Medicare Other

## 2019-04-13 ENCOUNTER — Ambulatory Visit (HOSPITAL_COMMUNITY)
Admission: RE | Admit: 2019-04-13 | Discharge: 2019-04-13 | Disposition: A | Discharge status: Home / Self Care | Payer: Medicare Other | Source: Ambulatory Visit | Attending: Cardiology | Admitting: Cardiology

## 2019-04-13 DIAGNOSIS — I447 Left bundle-branch block, unspecified: Secondary | ICD-10-CM | POA: Diagnosis not present

## 2019-04-13 DIAGNOSIS — I4892 Unspecified atrial flutter: Secondary | ICD-10-CM | POA: Diagnosis not present

## 2019-04-13 DIAGNOSIS — I5022 Chronic systolic (congestive) heart failure: Secondary | ICD-10-CM | POA: Insufficient documentation

## 2019-04-13 DIAGNOSIS — I110 Hypertensive heart disease with heart failure: Secondary | ICD-10-CM | POA: Diagnosis not present

## 2019-04-13 DIAGNOSIS — E119 Type 2 diabetes mellitus without complications: Secondary | ICD-10-CM | POA: Diagnosis not present

## 2019-04-13 DIAGNOSIS — I2510 Atherosclerotic heart disease of native coronary artery without angina pectoris: Secondary | ICD-10-CM | POA: Insufficient documentation

## 2019-04-13 DIAGNOSIS — Z95810 Presence of automatic (implantable) cardiac defibrillator: Secondary | ICD-10-CM | POA: Diagnosis not present

## 2019-04-13 DIAGNOSIS — I4891 Unspecified atrial fibrillation: Secondary | ICD-10-CM | POA: Insufficient documentation

## 2019-04-13 DIAGNOSIS — Z87891 Personal history of nicotine dependence: Secondary | ICD-10-CM | POA: Insufficient documentation

## 2019-04-13 DIAGNOSIS — I429 Cardiomyopathy, unspecified: Secondary | ICD-10-CM | POA: Insufficient documentation

## 2019-04-13 NOTE — Progress Notes (Signed)
EPIC Encounter for ICM Monitoring  Patient Name: Justin Hill is a 78 y.o. male Date: 04/13/2019 Primary Care Physican: Serita Grammes, MD Primary Cardiologist:McLean Electrophysiologist: Allred Bi-V Pacing: 98% 01/24/2019 Weight: 250 bs (baseline) 02/28/2019 Weight:260lbs 5/08/2020Weight:260 lbs   Transmission reviewed and results sent via mychart.  Thoracic impedancenormal.  Prescribed:Torsemide10 mg Alternate taking 20 mg (2 tablets) and 10 mg (1 tablet) every other day.   Labs:   5/11/20020 Creatinine 2.04, BUN 23, Potassium 5.3, Sodium 140, GFR 31-36 03/14/2019 Creatinine 1.73, BUN 17, Potassium 5.0, Sodium 139, GFR 38-43 02/07/2019 Creatinine1.62, BUN12, Potassium4.9, Sodium140, GFR41-47 01/24/2019 Creatinine2.90, BUN25, Potassium3.9, Sodium140, ERQ41-28  01/13/2019 Creatinine2.30, BUN21, Potassium4.5, Sodium136, SKS13-88 A complete set of results can be found in Results Review.  Recommendations:None  Follow-up plan: ICM clinic phone appointment on6/30/2020.Visit with Dr Aundra Dubin scheduled 04/26/2019.  Copy of ICM check sent to Dr Rayann Heman.  3 month ICM trend: 04/13/2019    1 Year ICM trend:       Rosalene Billings, RN 04/13/2019 1:41 PM

## 2019-04-13 NOTE — Progress Notes (Signed)
  Echocardiogram 2D Echocardiogram has been performed.  Justin Hill 04/13/2019, 12:08 PM

## 2019-04-14 ENCOUNTER — Encounter: Payer: Self-pay | Admitting: Gastroenterology

## 2019-04-14 ENCOUNTER — Encounter (HOSPITAL_COMMUNITY): Payer: Medicare Other | Admitting: Cardiology

## 2019-04-24 ENCOUNTER — Other Ambulatory Visit (HOSPITAL_COMMUNITY): Payer: Self-pay | Admitting: Cardiology

## 2019-04-25 DIAGNOSIS — Z794 Long term (current) use of insulin: Secondary | ICD-10-CM | POA: Diagnosis not present

## 2019-04-25 DIAGNOSIS — I110 Hypertensive heart disease with heart failure: Secondary | ICD-10-CM | POA: Diagnosis not present

## 2019-04-25 DIAGNOSIS — E1122 Type 2 diabetes mellitus with diabetic chronic kidney disease: Secondary | ICD-10-CM | POA: Diagnosis not present

## 2019-04-25 DIAGNOSIS — N183 Chronic kidney disease, stage 3 (moderate): Secondary | ICD-10-CM | POA: Diagnosis not present

## 2019-04-26 ENCOUNTER — Other Ambulatory Visit: Payer: Self-pay

## 2019-04-26 ENCOUNTER — Encounter (HOSPITAL_COMMUNITY): Payer: Self-pay | Admitting: *Deleted

## 2019-04-26 ENCOUNTER — Encounter (HOSPITAL_COMMUNITY): Payer: Self-pay

## 2019-04-26 ENCOUNTER — Ambulatory Visit (HOSPITAL_COMMUNITY)
Admission: RE | Admit: 2019-04-26 | Discharge: 2019-04-26 | Disposition: A | Discharge status: Home / Self Care | Payer: Medicare Other | Source: Ambulatory Visit | Attending: Cardiology | Admitting: Cardiology

## 2019-04-26 ENCOUNTER — Encounter: Payer: Self-pay | Admitting: Endocrinology

## 2019-04-26 VITALS — BP 127/72 | Wt 260.0 lb

## 2019-04-26 DIAGNOSIS — Z9989 Dependence on other enabling machines and devices: Secondary | ICD-10-CM

## 2019-04-26 DIAGNOSIS — I5022 Chronic systolic (congestive) heart failure: Secondary | ICD-10-CM

## 2019-04-26 DIAGNOSIS — G4733 Obstructive sleep apnea (adult) (pediatric): Secondary | ICD-10-CM

## 2019-04-26 DIAGNOSIS — L820 Inflamed seborrheic keratosis: Secondary | ICD-10-CM | POA: Diagnosis not present

## 2019-04-26 DIAGNOSIS — Z95810 Presence of automatic (implantable) cardiac defibrillator: Secondary | ICD-10-CM

## 2019-04-26 NOTE — Addendum Note (Signed)
Encounter addended by: Scarlette Calico, RN on: 04/26/2019 3:46 PM  Actions taken: Clinical Note Signed

## 2019-04-26 NOTE — Progress Notes (Signed)
Conrad [place], NP  Scarlette Calico, RN        Follow up with Dr Aundra Dubin 4 months.    AVS sent via mychart.  Message to schedulers to arrange f/u

## 2019-04-26 NOTE — Patient Instructions (Signed)
Continue current medications  Your physician recommends that you schedule a follow-up appointment in: 4 months, PLEASE CALL OUR OFFICE IN [place] TO SCHEDULE YOUR October APPOINTMENT  If you have any questions or concerns before your next appointment please send [place] a message through [place] or call our office at [phone].

## 2019-04-26 NOTE — Progress Notes (Signed)
Heart Failure TeleHealth Note  Due to national recommendations of social distancing due to [place] 19, Audio/video telehealth visit is felt to be most appropriate for this patient at this time.  See MyChart message from today for patient consent regarding telehealth for [facility].  Date:  04/26/2019   ID:  Justin Hill, DOB 1941-08-09, MRN [id number]  Location: Home  Provider location: [place] [facility] Type of Visit: Established patient   PCP:  Serita Grammes, MD  Cardiologist:  Loralie Champagne, MD Primary HF: Dr Aundra Dubin  Endocrinology: Dr Kandis Nab Kidney: Dr Royce Macadamia  Chief Complaint: Heart Failure   History of Present Illness: Justin Hill is a 78 y.o. male with a history of  CKD, HTN, diabetes, chronic LBBB, and nonischemic cardiomyopathy presents for followup of CHF.  His cardiomyopathy has been known for years.  He had a LHC in 2008 showing mild nonobstructive CAD.     Mr Mcphee was admitted in 2/16 with atrial flutter degenerating into atrial fibrillation.  He was initially cardioverted, then atrial flutter recurred.  He had atrial flutter ablation by Dr Rayann Heman in 2/16.  He thinks that he has been in NSR since that time, no further palpitations. He is on Xarelto now with no melena or BRBPR.    He is s/p placement of Medtronic CRT-D device.  Echo in 1/18 showed improvement in EF to 40-45%.  Echo in 3/19 showed EF 40-45%.   He developed normal pressure hydrocephalus and had VP shunt placed.  This has helped with his walking and some with his memory.   ECHO 03/2019 showed EF 55-60%.   He presents via Psychiatric nurse for a telehealth visit today.   Overall feeling fine.  Says he has SOB every now an then. Denies PND/Orthopnea. No bleeding issues. Appetite ok. No fever or chills. Weight at home  260 pounds. Taking all medications.  Recent device interrogation showed normal thoracic impedance.   he denies symptoms worrisome for COVID  19.   Past Medical History:  Diagnosis Date  . Altered mental status   . Cardiomyopathy   . CHF (congestive heart failure) ([place])   . Chronic kidney disease (CKD), stage III (moderate) (HCC)   . Communicating hydrocephalus ([place])   . Diabetes mellitus    type 2  . Diverticulosis   . History of kidney stones   . Hypercholesterolemia   . Hyperglycemia   . Hypertension   . Paroxysmal atrial fibrillation (HCC)   . Personal history of kidney stones   . Pneumonia   . Sleep apnea    uses cpap   Past Surgical History:  Procedure Laterality Date  . ATRIAL FLUTTER ABLATION N/A 01/19/2015   Procedure: ATRIAL FLUTTER ABLATION;  Surgeon: [name], MD;  Location: [facility] CATH LAB;  Service: Cardiovascular;  Laterality: N/A;  . CARDIAC CATHETERIZATION N/A 05/02/2016   Procedure: Right/Left Heart Cath and Coronary Angiography;  Surgeon: Larey Dresser, MD;  Location: [facility] CV LAB;  Service: Cardiovascular;  Laterality: N/A;  . COLONOSCOPY N/A 12/29/2013   Procedure: COLONOSCOPY;  Surgeon: Lafayette Dragon, MD;  Location: [facility] ENDOSCOPY;  Service: Endoscopy;  Laterality: N/A;  . EP IMPLANTABLE DEVICE N/A 06/05/2016   MDT Hillery Aldo XT CRTD implanted by Dr Rayann Heman   . EYE SURGERY     cataract surgery (not sure which eye)  . [place]   left. Muscle reattachment  . TOE SURGERY Right 12/2012   2nd toe  .  TONSILLECTOMY    . VENTRICULOPERITONEAL SHUNT Right 11/20/2017   Procedure: SHUNT PLACEMENT RIGHT;  Surgeon: Newman Pies, MD;  Location: [facility];  Service: Neurosurgery;  Laterality: Right;  SHUNT PLACEMENT RIGHT     Current Outpatient Medications  Medication Sig Dispense Refill  . Ascorbic Acid (VITAMIN C) 1000 MG tablet Take 1 tablet by mouth 2 (two) times daily.     . carvedilol (COREG) 25 MG tablet TAKE 1 TABLET BY MOUTH TWICE DAILY WITH MEALS 180 tablet 3  . fenofibrate 54 MG tablet Take 1 tablet by mouth once daily 90 tablet 0  . insulin NPH-regular Human (70-30) 100 UNIT/ML  injection 110 units with breakfast, and 60 units with supper, and syringes 2/day 100 mL 11  . loratadine (CLARITIN) 10 MG tablet Take 1 tablet by mouth 2 (two) times daily.     . Multiple Vitamin (MULTIVITAMIN) tablet Take 1 tablet by mouth daily.      Marland Kitchen omeprazole (PRILOSEC) 40 MG capsule Take 40 mg by mouth daily. OTC    . sacubitril-valsartan (ENTRESTO) 24-26 MG Take 1 tablet by mouth 2 (two) times daily. 60 tablet 3  . simvastatin (ZOCOR) 40 MG tablet Take 40 mg by mouth at bedtime.      . tadalafil (CIALIS) 5 MG tablet Take 5 mg by mouth daily.  3  . tamsulosin (FLOMAX) 0.4 MG CAPS capsule Take 0.4 mg by mouth daily.    Marland Kitchen torsemide (DEMADEX) 10 MG tablet Alternate taking 20 mg (2 tablets) and 10 mg (1 tablet) every other day. 90 tablet 3  . XARELTO 20 MG TABS tablet TAKE 1 TABLET BY MOUTH ONCE DAILY WITH  SUPPER 90 tablet 3   No current facility-administered medications for this encounter.     Allergies:   Penicillins; Aspirin; and Lisinopril   Social History:  The patient  reports that he has quit smoking. His smoking use included cigarettes. He has never used smokeless tobacco. He reports that he does not drink alcohol or use drugs.   Family History:  The patient's family history includes Cancer in his sister; Diabetes in an other family member; Diabetes type II (age of onset: 21) in his sister; Heart failure in his maternal grandmother; Hyperlipidemia in an other family member; Hypertension in an other family member; Stroke in an other family member.   ROS:  Please see the history of present illness.   All other systems are personally reviewed and negative.   Exam:  Tele Health Call; Exam is subjective  General:  Speaks in full sentences. No resp difficulty. Lungs: Normal respiratory effort with conversation.  Abdomen: Non-distended per patient report Extremities: Pt denies edema. Neuro: Alert & oriented x 3.   Recent Labs: 01/13/2019: Hemoglobin 12.4; Platelets 171 04/12/2019:  BUN 22; Creatinine, Ser 1.89; Potassium 4.8; Sodium 139  Personally reviewed   Wt Readings from Last 3 Encounters:  04/26/19 117.9 kg (260 lb)  01/13/19 115.7 kg (255 lb)  12/09/18 116.1 kg (256 lb)      ASSESSMENT AND PLAN:  1. Cardiomyopathy: Nonischemic.  Low EF x years. Echo 6/16 showed stable EF 25-30% with septal-lateral dyssynchrony.  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  Interestingly, it also appears that he has a long-standing LBBB.  A chronic LBBB itself can potentially cause a cardiomyopathy and may be the source of his low EF.   Coronary angiography in 6/17 with nonobstructive disease. Now s/p Medtronic CRT-D  device placement.   Most recent echo in 04/13/2019 EF 55-60%. I discussed ECHO results today.     - Continue Coreg 25 mg bid.  - Continue Entresto 97/103 bid.   - K and creatinine have been high, so he has not been placed on spironolactone.  - Continue torsemide 10 mg one day and 20 mg the next.     2. Hyperlipidemia: Patient is on simvastatin.   3. CKD: Stage 3.    I reviewed BMET from 04/12/2019 Creatinine 1/89  4. Atrial flutter: s/p ablation, in NSR today.  5. Atrial fibrillation: Patient had afib noted as well as flutter.  Continue Xarelto long-term.  No bleeding issues.  6. Normal pressure hydrocephalus: Has VP shunt.   7. OSA: Continue to use CPAP.  8. Type II diabetes: Dr Loanne Drilling managing diabetes.    COVID screen The patient does not have any symptoms that suggest any further testing/ screening at this time.  Social distancing reinforced today.  Patient Risk: After full review of this patients clinical status, I feel that they are at moderate risk for cardiac decompensation at this time.  Relevant cardiac medications were reviewed at length with the patient today. The patient does not have concerns regarding their medications at this time.   The following changes were made today:  No  change.  Recommended follow-up:  Follow up with Dr Aundra Dubin 4 months.   Today, I have spent 24 minutes with the patient with telehealth technology discussing the above issues .    Jeanmarie Hubert, NP  04/26/2019 3:12 PM  [facility] [address] [facility] and [place] [address] [phone] (office) [phone] (fax)

## 2019-04-26 NOTE — Telephone Encounter (Signed)
Please advise 

## 2019-04-27 ENCOUNTER — Other Ambulatory Visit: Payer: Self-pay | Admitting: Cardiology

## 2019-04-27 DIAGNOSIS — I509 Heart failure, unspecified: Secondary | ICD-10-CM | POA: Diagnosis not present

## 2019-04-28 ENCOUNTER — Other Ambulatory Visit (HOSPITAL_COMMUNITY): Payer: Self-pay | Admitting: Cardiology

## 2019-04-28 LAB — BASIC METABOLIC PANEL
BUN/Creatinine Ratio: 11 (ref 10–24)
BUN: 21 mg/dL (ref 8–27)
CO2: 30 mmol/L — ABNORMAL HIGH (ref 20–29)
Calcium: 9.5 mg/dL (ref 8.6–10.2)
Chloride: 100 mmol/L (ref 96–106)
Creatinine, Ser: 1.96 mg/dL — ABNORMAL HIGH (ref 0.76–1.27)
GFR calc Af Amer: 37 mL/min/{1.73_m2} — ABNORMAL LOW (ref >59)
GFR calc non Af Amer: 32 mL/min/{1.73_m2} — ABNORMAL LOW (ref >59)
Glucose: 210 mg/dL — ABNORMAL HIGH (ref 65–99)
Potassium: 4.7 mmol/L (ref 3.5–5.2)
Sodium: 139 mmol/L (ref 134–144)

## 2019-04-28 LAB — SPECIMEN STATUS REPORT

## 2019-05-02 ENCOUNTER — Other Ambulatory Visit (HOSPITAL_COMMUNITY): Payer: Self-pay | Admitting: Cardiology

## 2019-05-04 DIAGNOSIS — E1122 Type 2 diabetes mellitus with diabetic chronic kidney disease: Secondary | ICD-10-CM | POA: Diagnosis not present

## 2019-05-04 DIAGNOSIS — D631 Anemia in chronic kidney disease: Secondary | ICD-10-CM | POA: Diagnosis not present

## 2019-05-04 DIAGNOSIS — N183 Chronic kidney disease, stage 3 (moderate): Secondary | ICD-10-CM | POA: Diagnosis not present

## 2019-05-04 DIAGNOSIS — N200 Calculus of kidney: Secondary | ICD-10-CM | POA: Diagnosis not present

## 2019-05-04 DIAGNOSIS — I129 Hypertensive chronic kidney disease with stage 1 through stage 4 chronic kidney disease, or unspecified chronic kidney disease: Secondary | ICD-10-CM | POA: Diagnosis not present

## 2019-05-04 DIAGNOSIS — I5022 Chronic systolic (congestive) heart failure: Secondary | ICD-10-CM | POA: Diagnosis not present

## 2019-05-04 DIAGNOSIS — N401 Enlarged prostate with lower urinary tract symptoms: Secondary | ICD-10-CM | POA: Diagnosis not present

## 2019-05-06 ENCOUNTER — Encounter: Payer: Self-pay | Admitting: Endocrinology

## 2019-05-06 ENCOUNTER — Other Ambulatory Visit: Payer: Self-pay

## 2019-05-06 ENCOUNTER — Ambulatory Visit (INDEPENDENT_AMBULATORY_CARE_PROVIDER_SITE_OTHER): Payer: Medicare Other | Admitting: Endocrinology

## 2019-05-06 VITALS — BP 128/62 | HR 84 | Temp 97.6°F | Wt 264.0 lb

## 2019-05-06 DIAGNOSIS — N183 Chronic kidney disease, stage 3 unspecified: Secondary | ICD-10-CM

## 2019-05-06 DIAGNOSIS — Z794 Long term (current) use of insulin: Secondary | ICD-10-CM

## 2019-05-06 DIAGNOSIS — E1122 Type 2 diabetes mellitus with diabetic chronic kidney disease: Secondary | ICD-10-CM | POA: Diagnosis not present

## 2019-05-06 LAB — POCT GLYCOSYLATED HEMOGLOBIN (HGB A1C): Hemoglobin A1C: 9.3 % — AB (ref 4.0–5.6)

## 2019-05-06 MED ORDER — INSULIN NPH ISOPHANE & REGULAR (70-30) 100 UNIT/ML ~~LOC~~ SUSP: SUBCUTANEOUS | 11 refills | Status: DC

## 2019-05-06 NOTE — Progress Notes (Signed)
Subjective:    Patient ID: Justin Hill, male    DOB: 1941-11-22, 78 y.o.   MRN: [id number]  HPI DM type: Insulin-requiring type 2. Dx'ed: 9480 Complications: CHF, toe ulcer, partial toe amputation, polyneuropathy, and renal insufficiency.  Therapy: insulin since 2012.  DKA: never.   Severe hypoglycemia: never.    Pancreatitis: never.     Other: he takes human insulin, due to cost; in October of 2014, he was changed from multiple daily injections to BID, due to persistently high a1c.  wife gives insulin, due to his memory loss.    Interval history: wife states cbg's vary from 61-400's.  It is in general higher as the day goes on.  No new sxs.  He took a steroid injection 2 weeks ago, for itching.   Past Medical History:  Diagnosis Date  . Altered mental status   . Cardiomyopathy   . CHF (congestive heart failure) ([place])   . Chronic kidney disease (CKD), stage III (moderate) (HCC)   . Communicating hydrocephalus ([place])   . Diabetes mellitus    type 2  . Diverticulosis   . History of kidney stones   . Hypercholesterolemia   . Hyperglycemia   . Hypertension   . Paroxysmal atrial fibrillation (HCC)   . Personal history of kidney stones   . Pneumonia   . Sleep apnea    uses cpap    Past Surgical History:  Procedure Laterality Date  . ATRIAL FLUTTER ABLATION N/A 01/19/2015   Procedure: ATRIAL FLUTTER ABLATION;  Surgeon: [name], MD;  Location[facility] CATH LAB;  Service: Cardiovascular;  Laterality: N/A;  . CARDIAC CATHETERIZATION N/A 05/02/2016   Procedure: Right/Left Heart Cath and Coronary Angiography;  Surgeon: Larey Dresser, MD;  Location: [facility] CV LAB;  Service: Cardiovascular;  Laterality: N/A;  . COLONOSCOPY N/A 12/29/2013   Procedure: COLONOSCOPY;  Surgeon: Lafayette Dragon, MD;  [facility]: [facility] ENDOSCOPY;  Service: Endoscopy;  Laterality: N/A;  . EP IMPLANTABLE DEVICE N/A 06/05/2016   MDT Hillery Aldo XT CRTD implanted by Dr Rayann Heman   . EYE SURGERY     cataract surgery  (not sure which eye)  . [place]   left. Muscle reattachment  . TOE SURGERY Right 12/2012   2nd toe  . TONSILLECTOMY    . VENTRICULOPERITONEAL SHUNT Right 11/20/2017   Procedure: SHUNT PLACEMENT RIGHT;  Surgeon: Newman Pies, MD;  Location: [facility];  Service: Neurosurgery;  Laterality: Right;  SHUNT PLACEMENT RIGHT    Social History   Socioeconomic History  . Marital status: Married    Spouse name: Rosemarie Beath  . Number of children: 1  . Years of education: 39  . Highest education level: Not on file  Occupational History  . Occupation: retired  Scientific laboratory technician  . Financial resource strain: Not on file  . Food insecurity    Worry: Not on file    Inability: Not on file  . Transportation needs    Medical: Not on file    Non-medical: Not on file  Tobacco Use  . Smoking status: Former Smoker    Types: Cigarettes  . Smokeless tobacco: Never Used  Substance and Sexual Activity  . Alcohol use: No  . Drug use: No  . Sexual activity: Not on file  Lifestyle  . Physical activity    Days per week: Not on file    Minutes per session: Not on file  . Stress: Not on file  Relationships  . Social connections  Talks on phone: Not on file    Gets together: Not on file    Attends religious service: Not on file    Active member of club or organization: Not on file    Attends meetings of clubs or organizations: Not on file    Relationship status: Not on file  . Intimate partner violence    Fear of current or ex partner: Not on file    Emotionally abused: Not on file    Physically abused: Not on file    Forced sexual activity: Not on file  Other Topics Concern  . Not on file  Social History Narrative   Regular exercise-no   Rare caffeine use     Current Outpatient Medications on File Prior to Visit  Medication Sig Dispense Refill  . Ascorbic Acid (VITAMIN C) 1000 MG tablet Take 1 tablet by mouth 2 (two) times daily.     . carvedilol (COREG) 25 MG tablet TAKE 1  TABLET BY MOUTH TWICE DAILY WITH MEALS 180 tablet 3  . fenofibrate 54 MG tablet Take 1 tablet by mouth once daily 90 tablet 0  . loratadine (CLARITIN) 10 MG tablet Take 1 tablet by mouth 2 (two) times daily.     . Multiple Vitamin (MULTIVITAMIN) tablet Take 1 tablet by mouth daily.      Marland Kitchen omeprazole (PRILOSEC) 40 MG capsule Take 40 mg by mouth daily. OTC    . sacubitril-valsartan (ENTRESTO) 24-26 MG Take 1 tablet by mouth 2 (two) times daily. 60 tablet 3  . simvastatin (ZOCOR) 40 MG tablet Take 40 mg by mouth at bedtime.      . tadalafil (CIALIS) 5 MG tablet Take 5 mg by mouth daily.  3  . tamsulosin (FLOMAX) 0.4 MG CAPS capsule Take 0.4 mg by mouth daily.    Marland Kitchen torsemide (DEMADEX) 10 MG tablet Alternate taking 20 mg (2 tablets) and 10 mg (1 tablet) every other day. 90 tablet 3  . XARELTO 20 MG TABS tablet TAKE 1 TABLET BY MOUTH ONCE DAILY WITH  SUPPER 90 tablet 3   No current facility-administered medications on file prior to visit.     Allergies  Allergen Reactions  . Penicillins Anaphylaxis and Other (See Comments)    SYNCOPE PATIENT HAS HAD A PCN REACTION WITH IMMEDIATE RASH, FACIAL/TONGUE/THROAT SWELLING, SOB, OR LIGHTHEADEDNESS WITH HYPOTENSION:  #  #  #  YES  #  #  #   Has patient had a PCN reaction causing severe rash involving mucus membranes or skin necrosis: no PATIENT HAS HAD A PCN REACTION THAT REQUIRED HOSPITALIZATION:  #  #  #  YES  #  #  #  Has patient had a PCN reaction occurring within the last 10 years: no    . Aspirin Other (See Comments)    Burps, burning, stomach pains, etc  . Lisinopril Cough    Severe coughing    Family History  Problem Relation Age of Onset  . Heart failure Maternal Grandmother   . Diabetes type II Sister 10  . Cancer Sister   . Hyperlipidemia Other        Parent  . Stroke Other        Parent  . Hypertension Other        Parent  . Diabetes Other        Parent  . Colon cancer Neg Hx   . Heart attack Neg Hx     BP 128/62 (BP  Location: Left Arm, Patient Position:  Sitting, Cuff Size: Large)   Pulse 84   Temp 97.6 F (36.4 C) (Oral)   Wt 264 lb (119.7 kg)   SpO2 95%   BMI 35.80 kg/m    Review of Systems Denies LOC    Objective:   Physical Exam VITAL SIGNS:  See vs page GENERAL: no distress.  Pulses: foot pulses are intact bilaterally.   MSK: there are a few hammer toes on the left foot. right 2nd toe is surgically absent.  CV: 1+ bilat edema of the legs, and bilat vv's.   Skin:  no ulcer on the feet or ankles, but the skin is very dry.  normal temp on the feet and ankles. There is bilateral onychomycosis.    Neuro: sensation is intact to touch on the feet and ankles, but decreased from normal.     A1c=9.3%    Assessment & Plan:  Insulin-requiring type 2 DM: worse Renal insuff: in this setting, he needs most of his insulin in the morning Hypoglycemia: this limits aggressiveness of glycemic control.  Itching: steroid shot is prob affecting a1c Patient Instructions  Please change the insulin to 140 units with breakfast, and 50 units with supper.  Blood tests are requested for you today.  We'll let you know about the results.  On this type of insulin schedule, you should eat meals on a regular schedule.  If a meal is missed or significantly.  delayed, your blood sugar could go low.   check your blood sugar twice a day.  vary the time of day when you check, between before the 3 meals, and at bedtime.  also check if you have symptoms of your blood sugar being too high or too low.  please keep a record of the readings and bring it to your next appointment here.  please call us sooner if your blood sugar goes below 70, or if you have a lot of readings over 200.   If you have any blood sugar under 80, please write on the paper why you think that was.   Please come back for a follow-up appointment in 2 months.

## 2019-05-06 NOTE — Patient Instructions (Addendum)
Please change the insulin to 140 units with breakfast, and 50 units with supper.  Blood tests are requested for you today.  We'll let you know about the results.  On this type of insulin schedule, you should eat meals on a regular schedule.  If a meal is missed or significantly.  delayed, your blood sugar could go low.   check your blood sugar twice a day.  vary the time of day when you check, between before the 3 meals, and at bedtime.  also check if you have symptoms of your blood sugar being too high or too low.  please keep a record of the readings and bring it to your next appointment here.  please call us sooner if your blood sugar goes below 70, or if you have a lot of readings over 200.   If you have any blood sugar under 80, please write on the paper why you think that was.   Please come back for a follow-up appointment in 2 months.

## 2019-05-08 LAB — FRUCTOSAMINE: Fructosamine: 288 umol/L — ABNORMAL HIGH (ref 205–285)

## 2019-05-09 DIAGNOSIS — E1142 Type 2 diabetes mellitus with diabetic polyneuropathy: Secondary | ICD-10-CM | POA: Diagnosis not present

## 2019-05-09 DIAGNOSIS — Z794 Long term (current) use of insulin: Secondary | ICD-10-CM | POA: Diagnosis not present

## 2019-05-09 DIAGNOSIS — M2042 Other hammer toe(s) (acquired), left foot: Secondary | ICD-10-CM | POA: Diagnosis not present

## 2019-05-09 DIAGNOSIS — M2041 Other hammer toe(s) (acquired), right foot: Secondary | ICD-10-CM | POA: Diagnosis not present

## 2019-05-09 DIAGNOSIS — L84 Corns and callosities: Secondary | ICD-10-CM | POA: Diagnosis not present

## 2019-05-09 DIAGNOSIS — B351 Tinea unguium: Secondary | ICD-10-CM | POA: Diagnosis not present

## 2019-05-09 DIAGNOSIS — Z89421 Acquired absence of other right toe(s): Secondary | ICD-10-CM | POA: Diagnosis not present

## 2019-05-10 ENCOUNTER — Encounter (HOSPITAL_COMMUNITY): Payer: Self-pay

## 2019-05-10 ENCOUNTER — Other Ambulatory Visit: Payer: Self-pay | Admitting: Nephrology

## 2019-05-10 ENCOUNTER — Encounter: Payer: Self-pay | Admitting: Endocrinology

## 2019-05-10 DIAGNOSIS — N183 Chronic kidney disease, stage 3 unspecified: Secondary | ICD-10-CM

## 2019-05-17 NOTE — Telephone Encounter (Signed)
Please review and advise.

## 2019-05-18 ENCOUNTER — Other Ambulatory Visit (HOSPITAL_COMMUNITY): Payer: Self-pay | Admitting: *Deleted

## 2019-05-23 ENCOUNTER — Ambulatory Visit (INDEPENDENT_AMBULATORY_CARE_PROVIDER_SITE_OTHER): Payer: Medicare Other | Admitting: *Deleted

## 2019-05-23 ENCOUNTER — Telehealth: Payer: Self-pay

## 2019-05-23 DIAGNOSIS — I428 Other cardiomyopathies: Secondary | ICD-10-CM

## 2019-05-23 DIAGNOSIS — I509 Heart failure, unspecified: Secondary | ICD-10-CM

## 2019-05-23 NOTE — Telephone Encounter (Signed)
Wife called and reported patient is having the following symptoms:  SX: In the last 2 weeks pt's weight increased 10 lbs to 270 lbs, increase in fatigue, shortness of breath and swelling in feet.  BP 124/64, O2 is 93-95%.  She reports good urine output.   Optivol thoracic impedancenormal and not correlating with symptoms. Fluid index < threshold.  Prescribed:Torsemide10 mg Alternate taking 20 mg (2 tablets) and 10 mg (1 tablet) every other day.  Confirmed today he is taking the dosage.   Labs: 04/27/2019 Creatinine 1.96, BUN 11, Potassium 4.7, Sodium 139, GFR 32-37 04/12/2019 Creatinine 1.89, BUN 12, Potassium 4.8, Sodium 139, GFR 34-39 5/11/20020 Creatinine 2.04, BUN 23, Potassium 5.3, Sodium 140, GFR 31-36 A complete set of results can be found in Results Review.  Recommendations: Copy sent to Dr Aundra Dubin for recommendations if needed.  Follow-up plan: [facility] clinic phone appointment on8/01/2019.   Clinical Status (13-Apr-2019 to 23-May-2019)  AT/AF    4 episodes  Time in AT/AF   0.3 hr/day (1.4%)  Longest AT/AF   12 hours Observations (3) (13-Apr-2019 to 23-May-2019)  AT/AF >= 6 hr for 1 days.  Night heart rate over 85 bpm for 7 days.  Patient Activity less than 1 hr/day for 1 weeks.  05/23/2019 3 month trend view

## 2019-05-23 NOTE — Telephone Encounter (Signed)
Would cut back in dietary sodium, may be related to excess caloric intake/not much activity.  Would work on diet to start with.

## 2019-05-23 NOTE — Telephone Encounter (Signed)
Spoke with wife.  Advised Dr Aundra Dubin recommended to review his diet for high salt foods and limit to 2000 mg daily.  Asked how much sodium patient eats daily and she was unsure but could be from his snacks which consist of peanut M&Ms, potato chips, pickles, are a few of the ones he eats.  Asked if he is eating take out food and she said yes.  They normally eat take out food daily such has hamburgers.  Explained a diet high in salt may result in fluid accumulation which will cause symptoms such as shortness of breath, extremity and abdominal swelling, fatigue and weight gain are the most common. She said blood sodium levels have been normal and advised even though sodium lab tests are normal the salt will still cause the fluid retention without high blood sodium levels.  Advised if his condition worsens to use ER if needed or call back if symptoms are not urgent.  Will recheck fluid levels 06/06/2019.  She said they will try to improve his diet.   Office visit with Dr Rayann Heman scheduled 05/30/2019.

## 2019-05-23 NOTE — Telephone Encounter (Signed)
Please review and advise.

## 2019-05-23 NOTE — Telephone Encounter (Signed)
Attempted call to wife to provide Dr Claris Gladden recommendations and left voice mail message to return call.

## 2019-05-24 ENCOUNTER — Ambulatory Visit: Payer: Medicare Other | Admitting: Endocrinology

## 2019-05-24 ENCOUNTER — Ambulatory Visit
Admission: RE | Admit: 2019-05-24 | Discharge: 2019-05-24 | Disposition: A | Discharge status: Home / Self Care | Payer: Medicare Other | Source: Ambulatory Visit | Attending: Nephrology | Admitting: Nephrology

## 2019-05-24 DIAGNOSIS — E1122 Type 2 diabetes mellitus with diabetic chronic kidney disease: Secondary | ICD-10-CM | POA: Diagnosis not present

## 2019-05-24 DIAGNOSIS — N261 Atrophy of kidney (terminal): Secondary | ICD-10-CM | POA: Diagnosis not present

## 2019-05-24 DIAGNOSIS — N183 Chronic kidney disease, stage 3 unspecified: Secondary | ICD-10-CM

## 2019-05-24 DIAGNOSIS — E119 Type 2 diabetes mellitus without complications: Secondary | ICD-10-CM | POA: Diagnosis not present

## 2019-05-24 DIAGNOSIS — I10 Essential (primary) hypertension: Secondary | ICD-10-CM | POA: Diagnosis not present

## 2019-05-24 LAB — CUP PACEART REMOTE DEVICE CHECK
Battery Remaining Longevity: 25 mo
Battery Voltage: 2.94 V
Brady Statistic AP VP Percent: 0.26 %
Brady Statistic AP VS Percent: 0.01 %
Brady Statistic AS VP Percent: 99.43 %
Brady Statistic AS VS Percent: 0.3 %
Brady Statistic RA Percent Paced: 0.27 %
Brady Statistic RV Percent Paced: 99.67 %
Date Time Interrogation Session: 20200629103624
HighPow Impedance: 73 Ohm
Implantable Lead Implant Date: 20170713
Implantable Lead Implant Date: 20170713
Implantable Lead Implant Date: 20170713
Implantable Lead Location: 753858
Implantable Lead Location: 753859
Implantable Lead Location: 753860
Implantable Lead Model: 4598
Implantable Lead Model: 5076
Implantable Pulse Generator Implant Date: 20170713
Lead Channel Impedance Value: 342 Ohm
Lead Channel Impedance Value: 361 Ohm
Lead Channel Impedance Value: 361 Ohm
Lead Channel Impedance Value: 399 Ohm
Lead Channel Impedance Value: 399 Ohm
Lead Channel Impedance Value: 456 Ohm
Lead Channel Impedance Value: 475 Ohm
Lead Channel Impedance Value: 475 Ohm
Lead Channel Impedance Value: 646 Ohm
Lead Channel Impedance Value: 665 Ohm
Lead Channel Impedance Value: 760 Ohm
Lead Channel Impedance Value: 779 Ohm
Lead Channel Impedance Value: 779 Ohm
Lead Channel Pacing Threshold Amplitude: 0.375 V
Lead Channel Pacing Threshold Amplitude: 0.5 V
Lead Channel Pacing Threshold Amplitude: 2.75 V
Lead Channel Pacing Threshold Pulse Width: 0.4 ms
Lead Channel Pacing Threshold Pulse Width: 0.4 ms
Lead Channel Pacing Threshold Pulse Width: 0.8 ms
Lead Channel Sensing Intrinsic Amplitude: 1.125 mV
Lead Channel Sensing Intrinsic Amplitude: 1.125 mV
Lead Channel Sensing Intrinsic Amplitude: 11.875 mV
Lead Channel Sensing Intrinsic Amplitude: 11.875 mV
Lead Channel Setting Pacing Amplitude: 2 V
Lead Channel Setting Pacing Amplitude: 2.5 V
Lead Channel Setting Pacing Amplitude: 3.75 V
Lead Channel Setting Pacing Pulse Width: 0.4 ms
Lead Channel Setting Pacing Pulse Width: 0.8 ms
Lead Channel Setting Sensing Sensitivity: 0.3 mV

## 2019-05-25 ENCOUNTER — Encounter: Payer: Self-pay | Admitting: Internal Medicine

## 2019-05-25 ENCOUNTER — Other Ambulatory Visit: Payer: Self-pay

## 2019-05-25 ENCOUNTER — Ambulatory Visit (INDEPENDENT_AMBULATORY_CARE_PROVIDER_SITE_OTHER): Payer: Medicare Other | Admitting: Internal Medicine

## 2019-05-25 ENCOUNTER — Telehealth: Payer: Self-pay | Admitting: Internal Medicine

## 2019-05-25 ENCOUNTER — Telehealth: Payer: Self-pay

## 2019-05-25 VITALS — BP 122/66 | HR 98 | Ht 72.0 in | Wt 264.0 lb

## 2019-05-25 DIAGNOSIS — Z95810 Presence of automatic (implantable) cardiac defibrillator: Secondary | ICD-10-CM | POA: Diagnosis not present

## 2019-05-25 DIAGNOSIS — I5022 Chronic systolic (congestive) heart failure: Secondary | ICD-10-CM

## 2019-05-25 DIAGNOSIS — I480 Paroxysmal atrial fibrillation: Secondary | ICD-10-CM

## 2019-05-25 DIAGNOSIS — G4733 Obstructive sleep apnea (adult) (pediatric): Secondary | ICD-10-CM | POA: Insufficient documentation

## 2019-05-25 DIAGNOSIS — Z9989 Dependence on other enabling machines and devices: Secondary | ICD-10-CM | POA: Diagnosis not present

## 2019-05-25 LAB — CUP PACEART INCLINIC DEVICE CHECK
Battery Remaining Longevity: 25 mo
Battery Voltage: 2.93 V
Brady Statistic AP VP Percent: 2.43 %
Brady Statistic AP VS Percent: 0.07 %
Brady Statistic AS VP Percent: 95.69 %
Brady Statistic AS VS Percent: 1.81 %
Brady Statistic RA Percent Paced: 2.47 %
Brady Statistic RV Percent Paced: 18.82 %
Date Time Interrogation Session: 20200701175155
HighPow Impedance: 83 Ohm
Implantable Lead Implant Date: 20170713
Implantable Lead Implant Date: 20170713
Implantable Lead Implant Date: 20170713
Implantable Lead Location: 753858
Implantable Lead Location: 753859
Implantable Lead Location: 753860
Implantable Lead Model: 4598
Implantable Lead Model: 5076
Implantable Pulse Generator Implant Date: 20170713
Lead Channel Impedance Value: 399 Ohm
Lead Channel Impedance Value: 399 Ohm
Lead Channel Impedance Value: 418 Ohm
Lead Channel Impedance Value: 418 Ohm
Lead Channel Impedance Value: 418 Ohm
Lead Channel Impedance Value: 475 Ohm
Lead Channel Impedance Value: 513 Ohm
Lead Channel Impedance Value: 532 Ohm
Lead Channel Impedance Value: 665 Ohm
Lead Channel Impedance Value: 703 Ohm
Lead Channel Impedance Value: 779 Ohm
Lead Channel Impedance Value: 779 Ohm
Lead Channel Impedance Value: 817 Ohm
Lead Channel Pacing Threshold Amplitude: 0.5 V
Lead Channel Pacing Threshold Amplitude: 0.5 V
Lead Channel Pacing Threshold Amplitude: 2.75 V
Lead Channel Pacing Threshold Pulse Width: 0.4 ms
Lead Channel Pacing Threshold Pulse Width: 0.4 ms
Lead Channel Pacing Threshold Pulse Width: 0.8 ms
Lead Channel Sensing Intrinsic Amplitude: 0.75 mV
Lead Channel Sensing Intrinsic Amplitude: 11.875 mV
Lead Channel Sensing Intrinsic Amplitude: 2.125 mV
Lead Channel Sensing Intrinsic Amplitude: 9.5 mV
Lead Channel Setting Pacing Amplitude: 2 V
Lead Channel Setting Pacing Amplitude: 2.5 V
Lead Channel Setting Pacing Amplitude: 3.75 V
Lead Channel Setting Pacing Pulse Width: 0.4 ms
Lead Channel Setting Pacing Pulse Width: 0.8 ms
Lead Channel Setting Sensing Sensitivity: 0.3 mV

## 2019-05-25 NOTE — Patient Instructions (Signed)
Medication Instructions:  Your physician recommends that you continue on your current medications as directed. Please refer to the Current Medication list given to you today.  Labwork: None ordered.  Testing/Procedures: None ordered.  Follow-Up: Your physician wants you to follow-up in: one year with Dr. Rayann Heman.   You will receive a reminder letter in the mail two months in advance. If you don't receive a letter, please call our office to schedule the follow-up appointment.  Remote monitoring is used to monitor your ICD from home. This monitoring reduces the number of office visits required to check your device to one time per year. It allows [place] to keep an eye on the functioning of your device to ensure it is working properly. You are scheduled for a device check from home on 06/06/2019. You may send your transmission at any time that day. If you have a wireless device, the transmission will be sent automatically. After your physician reviews your transmission, you will receive a postcard with your next transmission date.  Any Other Special Instructions Will Be Listed Below (If Applicable).  If you need a refill on your cardiac medications before your next appointment, please call your pharmacy.

## 2019-05-25 NOTE — Telephone Encounter (Signed)
April to call to discuss

## 2019-05-25 NOTE — Progress Notes (Signed)
PCP: Serita Grammes, MD Primary Cardiologist: Dr Aundra Dubin Primary EP: Dr Rayann Heman  Justin Hill is a 78 y.o. male who presents today for routine electrophysiology followup.  Since last being seen in our clinic, the patient reports doing reasonably well.  Though his EF has normalized with CRT, he continues to have issues with fatigue, SOB, and edema.  He is followed in the advanced heart failure clinic as well as by nephrology.  Today, he denies symptoms of palpitations, chest pain, dizziness, presyncope, syncope, or ICD shocks.  The patient is otherwise without complaint today.   Past Medical History:  Diagnosis Date  . Altered mental status   . Cardiomyopathy   . CHF (congestive heart failure) ([place])   . Chronic kidney disease (CKD), stage III (moderate) (HCC)   . Communicating hydrocephalus ([place])   . Diabetes mellitus    type 2  . Diverticulosis   . History of kidney stones   . Hypercholesterolemia   . Hyperglycemia   . Hypertension   . Paroxysmal atrial fibrillation (HCC)   . Personal history of kidney stones   . Pneumonia   . Sleep apnea    uses cpap   Past Surgical History:  Procedure Laterality Date  . ATRIAL FLUTTER ABLATION N/A 01/19/2015   Procedure: ATRIAL FLUTTER ABLATION;  Surgeon: [name], MD;  Location: [facility] CATH LAB;  Service: Cardiovascular;  Laterality: N/A;  . CARDIAC CATHETERIZATION N/A 05/02/2016   Procedure: Right/Left Heart Cath and Coronary Angiography;  Surgeon: Larey Dresser, MD;  Location: [place] CV LAB;  Service: Cardiovascular;  Laterality: N/A;  . COLONOSCOPY N/A 12/29/2013   Procedure: COLONOSCOPY;  Surgeon: Lafayette Dragon, MD;  Location: [facility] ENDOSCOPY;  Service: Endoscopy;  Laterality: N/A;  . EP IMPLANTABLE DEVICE N/A 06/05/2016   MDT Hillery Aldo XT CRTD implanted by Dr Rayann Heman   . EYE SURGERY     cataract surgery (not sure which eye)  . [place]   left. Muscle reattachment  . TOE SURGERY Right 12/2012   2nd toe  .  TONSILLECTOMY    . VENTRICULOPERITONEAL SHUNT Right 11/20/2017   Procedure: SHUNT PLACEMENT RIGHT;  Surgeon: Newman Pies, MD;  Location: [place];  Service: Neurosurgery;  Laterality: Right;  SHUNT PLACEMENT RIGHT    ROS- all systems are reviewed and negative except as per HPI above  Current Outpatient Medications  Medication Sig Dispense Refill  . Ascorbic Acid (VITAMIN C) 1000 MG tablet Take 1 tablet by mouth 2 (two) times daily.     . carvedilol (COREG) 25 MG tablet TAKE 1 TABLET BY MOUTH TWICE DAILY WITH MEALS 180 tablet 3  . Cinnamon 500 MG TABS Take 1 tablet (500 mg total) by mouth daily.    . fenofibrate 54 MG tablet Take 1 tablet by mouth once daily 90 tablet 0  . Ferrous Sulfate (IRON) 325 (65 Fe) MG TABS Take by mouth. 30 tablet 0  . insulin NPH-regular Human (70-30) 100 UNIT/ML injection 140 units with breakfast, and 50 units with supper, and syringes 2/day 100 mL 11  . loratadine (CLARITIN) 10 MG tablet Take 1 tablet by mouth 2 (two) times daily.     . Multiple Vitamin (MULTIVITAMIN) tablet Take 1 tablet by mouth daily.      Marland Kitchen omeprazole (PRILOSEC) 40 MG capsule Take 40 mg by mouth daily. OTC    . sacubitril-valsartan (ENTRESTO) 24-26 MG Take 1 tablet by mouth 2 (two) times daily. 60 tablet 3  . simvastatin (ZOCOR) 40  MG tablet Take 40 mg by mouth at bedtime.      . tadalafil (CIALIS) 5 MG tablet Take 5 mg by mouth daily.  3  . tamsulosin (FLOMAX) 0.4 MG CAPS capsule Take 0.4 mg by mouth daily.    Marland Kitchen torsemide (DEMADEX) 10 MG tablet Alternate taking 20 mg (2 tablets) and 10 mg (1 tablet) every other day. 90 tablet 3  . Vitamin D, Ergocalciferol, (DRISDOL) 1.25 MG (50000 UT) CAPS capsule Take 1 capsule by mouth once a week.    Alveda Reasons 20 MG TABS tablet TAKE 1 TABLET BY MOUTH ONCE DAILY WITH  SUPPER 90 tablet 3   No current facility-administered medications for this visit.     Physical Exam: Vitals:   05/25/19 1453  BP: 122/66  Pulse: 98  SpO2: 95%  Weight: 264 lb  (119.7 kg)  Height: 6' (1.829 m)    GEN- The patient is overweight appearing, alert and oriented x 3 today.   Head- normocephalic, atraumatic Eyes-  Sclera clear, conjunctiva pink Ears- hearing intact Oropharynx- clear Lungs- Clear to ausculation bilaterally, normal work of breathing Chest- ICD pocket is well healed Heart- Regular rate and rhythm, no murmurs, rubs or gallops, PMI not laterally displaced GI- soft, NT, ND, + BS Extremities- no clubbing, cyanosis, or edema  ICD interrogation- reviewed in detail today,  See PACEART report  ekg tracing ordered today is personally reviewed and shows sinus with BiV pacing  Wt Readings from Last 3 Encounters:  05/25/19 264 lb (119.7 kg)  05/06/19 264 lb (119.7 kg)  04/26/19 260 lb (117.9 kg)    Assessment and Plan:  1.  Chronic systolic dysfunction euvolemic today EF has normalized with CRT! Stable on an appropriate medical regimen but does not feel well.  He will continue to follow closely with nephrology and advanced CHF teams. Normal BiV ICD function See Pace Art report No changes today he is not device dependant today  followed in ICM device clinic  2. Paroxysmal atrial fibrillation AF burden is 0.5 % (0.3% previously) Continue xarelto  3. OSA On CPAP  carelink Return to see me in a year   [name] MD, [facility] 05/25/2019 3:31 PM

## 2019-05-25 NOTE — Telephone Encounter (Signed)
New message   Patient's wife states that she requested that it be a face to face instead of a virtual visit today.  Please call asap.

## 2019-05-30 ENCOUNTER — Encounter: Payer: Medicare Other | Admitting: Internal Medicine

## 2019-05-31 DIAGNOSIS — D649 Anemia, unspecified: Secondary | ICD-10-CM | POA: Diagnosis not present

## 2019-06-02 NOTE — Progress Notes (Signed)
Remote ICD transmission.   

## 2019-06-06 ENCOUNTER — Ambulatory Visit (INDEPENDENT_AMBULATORY_CARE_PROVIDER_SITE_OTHER): Payer: Medicare Other

## 2019-06-06 DIAGNOSIS — I5022 Chronic systolic (congestive) heart failure: Secondary | ICD-10-CM

## 2019-06-06 DIAGNOSIS — Z95810 Presence of automatic (implantable) cardiac defibrillator: Secondary | ICD-10-CM

## 2019-06-06 NOTE — Progress Notes (Signed)
EPIC Encounter for ICM Monitoring  Patient Name: Justin Hill is a 78 y.o. male Date: 06/06/2019 Primary Care Physican: Serita Grammes, MD Primary Cardiologist:[place] Electrophysiologist: Allred Bi-V Pacing: 98.8% 7/13/2020Weight:260 lbs  Clinical Status (25-May-2019 to 06-Jun-2019)  AT/AF   2 episodes  Time in AT/AF    1.0 hr/day (4.2%)  Observations (2) (25-May-2019 to 06-Jun-2019)  AT/AF >= 6 hr for 1 days.  Patient Activity less than 1 hr/day for 1 weeks.                      Spoke with wife.  She states patient has anemia and was notified today by physician office the iron pills are not helping.  The physician managing the anemia has indicated he may need to start iron infusions.          He continues to have SOB and fatigue but no worse than it has been for the last month.  He also complains of abdominal bloating and excessive gas that he feels like he cannot control.         She describes that patient is more irritable within the last couple of months and has no patience.   Optivol thoracic impedancesuggestive of possible fluid accumulation since 05/29/2019.    Prescribed:Torsemide10 mg Alternate taking 20 mg (2 tablets) and 10 mg (1 tablet) every other day.  Confirmed 7/13 he is taking Torsemide as prescribed.    Labs: 04/27/2019 Creatinine 1.96, BUN 21, Potassium 4.7, Sodium 139, GFR 32-37 04/12/2019 Creatinine 1.89, BUN 22, Potassium 4.8, Sodium 139, GFR 34-39  5/11/20020 Creatinine 2.04, BUN 23, Potassium 5.3, Sodium 140, GFR 31-36 03/14/2019 Creatinine 1.73, BUN 17, Potassium 5.0, Sodium 139, GFR 38-43 02/07/2019 Creatinine1.62, BUN12, Potassium4.9, Sodium140, GFR41-47 01/24/2019 Creatinine2.90, BUN25, Potassium3.9, Sodium140, GFR20-23  01/13/2019 Creatinine2.30, BUN21, Potassium4.5, Sodium136, [id number] A complete set of results can be found in Results Review.  Recommendations: Advised will send to Dr Aundra Dubin for review  and if any recommendations will call back.   Follow-up plan: ICM clinic phone appointment on7/20/2020 to recheck fluid levels.  Copy of ICM check sent to Dr Rayann Heman and Dr Aundra Dubin.   3 month ICM trend: 06/06/2019    AT/AF    1 Year ICM trend:       Rosalene Billings, RN 06/06/2019 2:11 PM

## 2019-06-07 DIAGNOSIS — N183 Chronic kidney disease, stage 3 (moderate): Secondary | ICD-10-CM | POA: Diagnosis not present

## 2019-06-07 DIAGNOSIS — R0600 Dyspnea, unspecified: Secondary | ICD-10-CM | POA: Diagnosis not present

## 2019-06-07 DIAGNOSIS — R635 Abnormal weight gain: Secondary | ICD-10-CM | POA: Diagnosis not present

## 2019-06-07 DIAGNOSIS — E1122 Type 2 diabetes mellitus with diabetic chronic kidney disease: Secondary | ICD-10-CM | POA: Diagnosis not present

## 2019-06-07 DIAGNOSIS — I110 Hypertensive heart disease with heart failure: Secondary | ICD-10-CM | POA: Diagnosis not present

## 2019-06-07 DIAGNOSIS — J449 Chronic obstructive pulmonary disease, unspecified: Secondary | ICD-10-CM | POA: Diagnosis not present

## 2019-06-07 DIAGNOSIS — Z794 Long term (current) use of insulin: Secondary | ICD-10-CM | POA: Diagnosis not present

## 2019-06-08 ENCOUNTER — Other Ambulatory Visit (HOSPITAL_COMMUNITY): Payer: Self-pay | Admitting: Cardiology

## 2019-06-08 MED ORDER — SIMVASTATIN 40 MG PO TABS: 40.0000 mg | ORAL_TABLET | Freq: Every day | ORAL | 3 refills | Status: AC

## 2019-06-08 MED ORDER — CARVEDILOL 25 MG PO TABS: 25.0000 mg | ORAL_TABLET | Freq: Two times a day (BID) | ORAL | 3 refills | Status: DC

## 2019-06-08 MED ORDER — FENOFIBRATE 54 MG PO TABS: 54.0000 mg | ORAL_TABLET | Freq: Every day | ORAL | 3 refills | Status: DC

## 2019-06-08 MED ORDER — TORSEMIDE 10 MG PO TABS: ORAL_TABLET | ORAL | 3 refills | Status: DC

## 2019-06-08 MED ORDER — SACUBITRIL-VALSARTAN 24-26 MG PO TABS: 1.0000 | ORAL_TABLET | Freq: Two times a day (BID) | ORAL | 3 refills | Status: DC

## 2019-06-08 MED ORDER — RIVAROXABAN 20 MG PO TABS: ORAL_TABLET | ORAL | 3 refills | Status: DC

## 2019-06-10 ENCOUNTER — Other Ambulatory Visit (HOSPITAL_COMMUNITY): Payer: Self-pay | Admitting: Cardiology

## 2019-06-10 ENCOUNTER — Ambulatory Visit (INDEPENDENT_AMBULATORY_CARE_PROVIDER_SITE_OTHER): Payer: Medicare Other

## 2019-06-10 DIAGNOSIS — Z95810 Presence of automatic (implantable) cardiac defibrillator: Secondary | ICD-10-CM

## 2019-06-10 DIAGNOSIS — I5022 Chronic systolic (congestive) heart failure: Secondary | ICD-10-CM

## 2019-06-10 NOTE — Progress Notes (Signed)
EPIC Encounter for ICM Monitoring  Patient Name: Justin Hill is a 78 y.o. male Date: 06/10/2019 Primary Care Physican: Serita Grammes, MD Primary Cardiologist:[place] Electrophysiologist: Allred Bi-V Pacing: 99.5% 7/13/2020Weight:260 lbs   Clinical Status (06-Jun-2019 to 09-Jun-2019)  AT/AF   4 episodes  Time in AT/AF    0.5 hr/day (2.0%)  Longest AT/AF    37 minutes                 Spoke with wife. She sent remote transmission on 7/16 due to patient's heart rate dropped to 48 and 61 and he because extremely ShOB ~5:30.  She said the episode did not last long and he felt better later that evening.        Pt will start iron infusions next week for anemia.  He is feeling okay today.  He has not missed any medication dosages and taking Xarelto.   Optivol thoracic impedancehas returned to normal since 06/06/2019 transmission.  Report sent to device clinic triage for review.   Prescribed:Torsemide10mg  Alternate taking 20 mg (2 tablets) and 10 mg (1 tablet) every other day.  Confirmed 7/13 he is taking Torsemide as prescribed.   Labs: 04/27/2019 Creatinine 1.96, BUN 21, Potassium 4.7, Sodium 139, GFR 32-37 04/12/2019 Creatinine 1.89, BUN 22, Potassium 4.8, Sodium 139, GFR 34-39  5/11/20020 Creatinine 2.04, BUN 23, Potassium 5.3, Sodium 140, GFR 31-36 03/14/2019 Creatinine 1.73, BUN 17, Potassium 5.0, Sodium 139, GFR 38-43 02/07/2019 Creatinine1.62, BUN12, Potassium4.9, Sodium140, GFR41-47 01/24/2019 Creatinine2.90, BUN25, Potassium3.9, Sodium140, GFR20-23  01/13/2019 Creatinine2.30, BUN21, Potassium4.5, Sodium136, [id number] A complete set of results can be found in Results Review.  Recommendations: Advised will send copy of ICM check to Allred and Dr Aundra Dubin for review and if any recommendations will call back. Advised if condition worsens to call 911.   Follow-up plan: ICM clinic phone appointment on8/24/2020.  AT/AF    3 month  ICM trend: 06/09/2019    1 Year ICM trend:       Rosalene Billings, RN 06/10/2019 12:18 PM

## 2019-06-11 DIAGNOSIS — K8020 Calculus of gallbladder without cholecystitis without obstruction: Secondary | ICD-10-CM | POA: Diagnosis not present

## 2019-06-11 DIAGNOSIS — R1011 Right upper quadrant pain: Secondary | ICD-10-CM | POA: Diagnosis not present

## 2019-06-14 ENCOUNTER — Other Ambulatory Visit (HOSPITAL_COMMUNITY): Payer: Self-pay

## 2019-06-14 MED ORDER — SACUBITRIL-VALSARTAN 24-26 MG PO TABS: 1.0000 | ORAL_TABLET | Freq: Two times a day (BID) | ORAL | 3 refills | Status: DC

## 2019-06-15 DIAGNOSIS — D631 Anemia in chronic kidney disease: Secondary | ICD-10-CM | POA: Diagnosis not present

## 2019-06-15 DIAGNOSIS — N189 Chronic kidney disease, unspecified: Secondary | ICD-10-CM | POA: Diagnosis not present

## 2019-06-21 DIAGNOSIS — N183 Chronic kidney disease, stage 3 (moderate): Secondary | ICD-10-CM | POA: Diagnosis not present

## 2019-06-21 DIAGNOSIS — E113591 Type 2 diabetes mellitus with proliferative diabetic retinopathy without macular edema, right eye: Secondary | ICD-10-CM | POA: Diagnosis not present

## 2019-06-21 DIAGNOSIS — H25812 Combined forms of age-related cataract, left eye: Secondary | ICD-10-CM | POA: Diagnosis not present

## 2019-06-21 DIAGNOSIS — J449 Chronic obstructive pulmonary disease, unspecified: Secondary | ICD-10-CM | POA: Diagnosis not present

## 2019-06-21 DIAGNOSIS — E1122 Type 2 diabetes mellitus with diabetic chronic kidney disease: Secondary | ICD-10-CM | POA: Diagnosis not present

## 2019-06-21 DIAGNOSIS — Z794 Long term (current) use of insulin: Secondary | ICD-10-CM | POA: Diagnosis not present

## 2019-06-21 DIAGNOSIS — Z961 Presence of intraocular lens: Secondary | ICD-10-CM | POA: Diagnosis not present

## 2019-06-21 LAB — HM DIABETES EYE EXAM

## 2019-06-22 DIAGNOSIS — D631 Anemia in chronic kidney disease: Secondary | ICD-10-CM | POA: Diagnosis not present

## 2019-06-22 DIAGNOSIS — N189 Chronic kidney disease, unspecified: Secondary | ICD-10-CM | POA: Diagnosis not present

## 2019-06-24 DIAGNOSIS — E785 Hyperlipidemia, unspecified: Secondary | ICD-10-CM | POA: Diagnosis not present

## 2019-06-24 DIAGNOSIS — I10 Essential (primary) hypertension: Secondary | ICD-10-CM | POA: Diagnosis not present

## 2019-07-05 DIAGNOSIS — G912 (Idiopathic) normal pressure hydrocephalus: Secondary | ICD-10-CM | POA: Diagnosis not present

## 2019-07-06 DIAGNOSIS — D649 Anemia, unspecified: Secondary | ICD-10-CM | POA: Diagnosis not present

## 2019-07-06 DIAGNOSIS — N183 Chronic kidney disease, stage 3 (moderate): Secondary | ICD-10-CM | POA: Diagnosis not present

## 2019-07-11 ENCOUNTER — Other Ambulatory Visit: Payer: Self-pay | Admitting: Cardiology

## 2019-07-11 ENCOUNTER — Other Ambulatory Visit: Payer: Self-pay

## 2019-07-12 DIAGNOSIS — N289 Disorder of kidney and ureter, unspecified: Secondary | ICD-10-CM | POA: Diagnosis not present

## 2019-07-12 DIAGNOSIS — N200 Calculus of kidney: Secondary | ICD-10-CM | POA: Diagnosis not present

## 2019-07-12 DIAGNOSIS — N401 Enlarged prostate with lower urinary tract symptoms: Secondary | ICD-10-CM | POA: Diagnosis not present

## 2019-07-13 ENCOUNTER — Other Ambulatory Visit: Payer: Self-pay

## 2019-07-13 ENCOUNTER — Ambulatory Visit (INDEPENDENT_AMBULATORY_CARE_PROVIDER_SITE_OTHER): Payer: Medicare Other | Admitting: Endocrinology

## 2019-07-13 ENCOUNTER — Encounter: Payer: Self-pay | Admitting: Endocrinology

## 2019-07-13 VITALS — BP 128/72 | HR 95 | Ht 72.0 in | Wt 274.2 lb

## 2019-07-13 DIAGNOSIS — Z794 Long term (current) use of insulin: Secondary | ICD-10-CM | POA: Diagnosis not present

## 2019-07-13 DIAGNOSIS — N183 Chronic kidney disease, stage 3 unspecified: Secondary | ICD-10-CM

## 2019-07-13 DIAGNOSIS — E1122 Type 2 diabetes mellitus with diabetic chronic kidney disease: Secondary | ICD-10-CM | POA: Diagnosis not present

## 2019-07-13 LAB — POCT GLYCOSYLATED HEMOGLOBIN (HGB A1C): Hemoglobin A1C: 6 % — AB (ref 4.0–5.6)

## 2019-07-13 MED ORDER — INSULIN NPH ISOPHANE & REGULAR (70-30) 100 UNIT/ML ~~LOC~~ SUSP: SUBCUTANEOUS | 11 refills | Status: DC

## 2019-07-13 NOTE — Progress Notes (Signed)
Subjective:    Patient ID: Justin Hill, male    DOB: Feb 20, 1941, 78 y.o.   MRN: [id number]  HPI DM type: Insulin-requiring type 2. Dx'ed: 7408 Complications: CHF, toe ulcer, partial toe amputation, polyneuropathy, and renal insufficiency.  Therapy: insulin since 2012.  DKA: never.   Severe hypoglycemia: never.    Pancreatitis: never.     Other: he takes human insulin, due to cost; in October of 2014, he was changed from multiple daily injections to BID, due to persistently high a1c.  wife gives insulin, due to his memory loss.    Interval history: wife states cbg's vary from 26-216.  It is in general lowest in the afternoon.  No new sxs.  No recent steroids.  He recently had 2 Fe infusions Past Medical History:  Diagnosis Date  . Altered mental status   . Cardiomyopathy   . CHF (congestive heart failure) ([place])   . Chronic kidney disease (CKD), stage III (moderate) (HCC)   . Communicating hydrocephalus ([place])   . Diabetes mellitus    type 2  . Diverticulosis   . History of kidney stones   . Hypercholesterolemia   . Hyperglycemia   . Hypertension   . Paroxysmal atrial fibrillation (H[facility])   . Personal history of kidney stones   . Pneumonia   . Sleep apnea    uses cpap    Past Surgical History:  Procedure Laterality Date  . ATRIAL FLUTTER ABLATION N/A 01/19/2015   Procedure: ATRIAL FLUTTER ABLATION;  Surgeon: [name], MD;  Location: [facility] CATH LAB;  Service: Cardiovascular;  Laterality: N/A;  . CARDIAC CATHETERIZATION N/A 05/02/2016   Procedure: Right/Left Heart Cath and Coronary Angiography;  Surgeon: Larey Dresser, MD;  Location: [place] CV LAB;  Service: Cardiovascular;  Laterality: N/A;  . COLONOSCOPY N/A 12/29/2013   Procedure: COLONOSCOPY;  Surgeon: Lafayette Dragon, MD;  Location: [facility] ENDOSCOPY;  Service: Endoscopy;  Laterality: N/A;  . EP IMPLANTABLE DEVICE N/A 06/05/2016   MDT Hillery Aldo XT CRTD implanted by Dr Rayann Heman   . EYE SURGERY     cataract surgery (not  sure which eye)  . [place]   left. Muscle reattachment  . TOE SURGERY Right 12/2012   2nd toe  . TONSILLECTOMY    . VENTRICULOPERITONEAL SHUNT Right 11/20/2017   Procedure: SHUNT PLACEMENT RIGHT;  Surgeon: Newman Pies, MD;  Location: [place];  Service: Neurosurgery;  Laterality: Right;  SHUNT PLACEMENT RIGHT    Social History   Socioeconomic History  . Marital status: Married    Spouse name: Rosemarie Beath  . Number of children: 1  . Years of education: 48  . Highest education level: Not on file  Occupational History  . Occupation: retired  Scientific laboratory technician  . Financial resource strain: Not on file  . Food insecurity    Worry: Not on file    Inability: Not on file  . Transportation needs    Medical: Not on file    Non-medical: Not on file  Tobacco Use  . Smoking status: Former Smoker    Types: Cigarettes  . Smokeless tobacco: Never Used  Substance and Sexual Activity  . Alcohol use: No  . Drug use: No  . Sexual activity: Not on file  Lifestyle  . Physical activity    Days per week: Not on file    Minutes per session: Not on file  . Stress: Not on file  Relationships  . Social connections    Talks  on phone: Not on file    Gets together: Not on file    Attends religious service: Not on file    Active member of club or organization: Not on file    Attends meetings of clubs or organizations: Not on file    Relationship status: Not on file  . Intimate partner violence    Fear of current or ex partner: Not on file    Emotionally abused: Not on file    Physically abused: Not on file    Forced sexual activity: Not on file  Other Topics Concern  . Not on file  Social History Narrative   Regular exercise-no   Rare caffeine use     Current Outpatient Medications on File Prior to Visit  Medication Sig Dispense Refill  . Ascorbic Acid (VITAMIN C) 1000 MG tablet Take 1 tablet by mouth 2 (two) times daily.     . carvedilol (COREG) 25 MG tablet Take 1 tablet  (25 mg total) by mouth 2 (two) times daily with a meal. 180 tablet 3  . Cinnamon 500 MG TABS Take 1 tablet (500 mg total) by mouth daily.    . fenofibrate 54 MG tablet Take 1 tablet (54 mg total) by mouth daily. 90 tablet 3  . loratadine (CLARITIN) 10 MG tablet Take 1 tablet by mouth 2 (two) times daily.     . Multiple Vitamin (MULTIVITAMIN) tablet Take 1 tablet by mouth daily.      Marland Kitchen omeprazole (PRILOSEC) 40 MG capsule Take 40 mg by mouth daily. OTC    . sacubitril-valsartan (ENTRESTO) 24-26 MG Take 1 tablet by mouth 2 (two) times daily. 180 tablet 3  . simvastatin (ZOCOR) 40 MG tablet Take 1 tablet (40 mg total) by mouth at bedtime. 90 tablet 3  . tadalafil (CIALIS) 5 MG tablet Take 5 mg by mouth daily.  3  . tamsulosin (FLOMAX) 0.4 MG CAPS capsule Take 0.4 mg by mouth daily.    Marland Kitchen torsemide (DEMADEX) 10 MG tablet Alternate taking 20 mg (2 tablets) and 10 mg (1 tablet) every other day. 90 tablet 3  . Vitamin D, Ergocalciferol, (DRISDOL) 1.25 MG (50000 UT) CAPS capsule Take 1 capsule by mouth once a week.    Alveda Reasons 20 MG TABS tablet TAKE 1 TABLET BY MOUTH ONCE DAILY WITH  SUPPER 90 tablet 3  . Ferrous Sulfate (IRON) 325 (65 Fe) MG TABS Take by mouth. 30 tablet 0   No current facility-administered medications on file prior to visit.     Allergies  Allergen Reactions  . Penicillins Anaphylaxis and Other (See Comments)    SYNCOPE PATIENT HAS HAD A PCN REACTION WITH IMMEDIATE RASH, FACIAL/TONGUE/THROAT SWELLING, SOB, OR LIGHTHEADEDNESS WITH HYPOTENSION:  #  #  #  YES  #  #  #   Has patient had a PCN reaction causing severe rash involving mucus membranes or skin necrosis: no PATIENT HAS HAD A PCN REACTION THAT REQUIRED HOSPITALIZATION:  #  #  #  YES  #  #  #  Has patient had a PCN reaction occurring within the last 10 years: no    . Aspirin Other (See Comments)    Burps, burning, stomach pains, etc  . Lisinopril Cough    Severe coughing    Family History  Problem Relation Age of  Onset  . Heart failure Maternal Grandmother   . Diabetes type II Sister 50  . Cancer Sister   . Hyperlipidemia Other  Parent  . Stroke Other        Parent  . Hypertension Other        Parent  . Diabetes Other        Parent  . Colon cancer Neg Hx   . Heart attack Neg Hx     BP 128/72 (BP Location: Left Arm, Patient Position: Sitting, Cuff Size: Large)   Pulse 95   Ht 6' (1.829 m)   Wt 274 lb 3.2 oz (124.4 kg)   SpO2 99%   BMI 37.19 kg/m    Review of Systems He has gained a few lbs    Objective:   Physical Exam VITAL SIGNS:  See vs page GENERAL: no distress Pulses: foot pulses are intact bilaterally.   MSK: there are a few hammer toes on the left foot. right 2nd toe is surgically absent.  CV: 2+ bilat edema of the legs, and bilat vv's.   Skin:  no ulcer on the feet or ankles, but the skin is very dry.  normal temp on the feet and ankles.  there is bilateral onychomycosis.    Neuro: sensation is intact to touch on the feet and ankles, but decreased from normal.         Assessment & Plan:  Insulin-requiring type 2 DM, with PN: overcontrolled.    Anemia: rx of this could suppress A1c, so we'll reduce insulin just slightly.  Renal insuff: in this setting, he needs most of his insulin with breakfast.   Patient Instructions  Please change the insulin to 130 units with breakfast, and 50 units with the evening meal.  On this type of insulin schedule, you should eat meals on a regular schedule.  If a meal is missed or significantly.  delayed, your blood sugar could go low.   check your blood sugar twice a day.  vary the time of day when you check, between before the 3 meals, and at bedtime.  also check if you have symptoms of your blood sugar being too high or too low.  please keep a record of the readings and bring it to your next appointment here.  please call us sooner if your blood sugar goes below 70, or if you have a lot of readings over 200.   If you have any blood  sugar under 80, please write on the paper why you think that was.   Please come back for a follow-up appointment in 2-3 months.

## 2019-07-13 NOTE — Patient Instructions (Addendum)
Please change the insulin to 130 units with breakfast, and 50 units with the evening meal.  On this type of insulin schedule, you should eat meals on a regular schedule.  If a meal is missed or significantly.  delayed, your blood sugar could go low.   check your blood sugar twice a day.  vary the time of day when you check, between before the 3 meals, and at bedtime.  also check if you have symptoms of your blood sugar being too high or too low.  please keep a record of the readings and bring it to your next appointment here.  please call us sooner if your blood sugar goes below 70, or if you have a lot of readings over 200.   If you have any blood sugar under 80, please write on the paper why you think that was.   Please come back for a follow-up appointment in 2-3 months.

## 2019-07-18 ENCOUNTER — Ambulatory Visit (INDEPENDENT_AMBULATORY_CARE_PROVIDER_SITE_OTHER): Payer: Medicare Other

## 2019-07-18 DIAGNOSIS — Z95810 Presence of automatic (implantable) cardiac defibrillator: Secondary | ICD-10-CM

## 2019-07-18 DIAGNOSIS — I5022 Chronic systolic (congestive) heart failure: Secondary | ICD-10-CM

## 2019-07-20 NOTE — Progress Notes (Signed)
EPIC Encounter for ICM Monitoring  Patient Name: Justin Hill is a 78 y.o. male Date: 07/20/2019 Primary Care Physican: Serita Grammes, MD Primary Cardiologist:[place] Electrophysiologist: Allred Bi-V Pacing: 99.7% 8/26/2020Weight:270 lbs  Clinical Status (27-Jun-2019 to 18-Jul-2019)  AT/AF  4 episodes  Time in AT/AF  0.4 hr/day (1.7%)  Longest AT/AF   7 hours  Observations (2) (27-Jun-2019 to 18-Jul-2019)  AT/AF >= 6 hr for 1 days.  Patient Activity less than 1 hr/day for 2 weeks.       Spoke with wife. Heart Failure questions reviewed.  Pt asymptomatic.  He received a couple of iron infusions.     Optivol thoracic impedance normal   Prescribed: Torsemide10mg  Alternate taking 20 mg (2 tablets) and 10 mg (1 tablet) every other day.  Labs: 04/27/2019 Creatinine1.96Elaina Pattee, Potassium4.7, Sodium139, [id number] 04/12/2019 Creatinine1.89, BUN22, Potassium4.8, [id number], [id number] 5/11/20020 Creatinine 2.04, BUN 23, Potassium 5.3, Sodium 140, GFR 31-36 03/14/2019 Creatinine 1.73, BUN 17, Potassium 5.0, Sodium 139, GFR 38-43 02/07/2019 Creatinine1.62, BUN12, Potassium4.9, Sodium140, GFR41-47 01/24/2019 Creatinine2.90, BUN25, Potassium3.9, Sodium140, GFR20-23  01/13/2019 Creatinine2.30, BUN21, Potassium4.5, Sodium136, [id number] A complete set of results can be found in Results Review.  Recommendations: No changes and encouraged to call if experiencing any fluid symptoms.  Follow-up plan: ICM clinic phone appointment on 09/05/2019.  91 day device clinic remote transmission 08/23/2019.     Copy of ICM check sent to Dr. Rayann Heman.   3 month ICM trend: 07/18/2019    1 Year ICM trend:       Rosalene Billings, RN 07/20/2019 12:15 PM

## 2019-08-08 DIAGNOSIS — E1142 Type 2 diabetes mellitus with diabetic polyneuropathy: Secondary | ICD-10-CM | POA: Diagnosis not present

## 2019-08-08 DIAGNOSIS — L84 Corns and callosities: Secondary | ICD-10-CM | POA: Diagnosis not present

## 2019-08-08 DIAGNOSIS — M2042 Other hammer toe(s) (acquired), left foot: Secondary | ICD-10-CM | POA: Diagnosis not present

## 2019-08-08 DIAGNOSIS — B351 Tinea unguium: Secondary | ICD-10-CM | POA: Diagnosis not present

## 2019-08-08 DIAGNOSIS — L603 Nail dystrophy: Secondary | ICD-10-CM | POA: Diagnosis not present

## 2019-08-08 DIAGNOSIS — M2041 Other hammer toe(s) (acquired), right foot: Secondary | ICD-10-CM | POA: Diagnosis not present

## 2019-08-08 DIAGNOSIS — Z89421 Acquired absence of other right toe(s): Secondary | ICD-10-CM | POA: Diagnosis not present

## 2019-08-17 DIAGNOSIS — N183 Chronic kidney disease, stage 3 (moderate): Secondary | ICD-10-CM | POA: Diagnosis not present

## 2019-08-22 ENCOUNTER — Other Ambulatory Visit (HOSPITAL_COMMUNITY): Payer: Self-pay | Admitting: Cardiology

## 2019-08-23 ENCOUNTER — Ambulatory Visit (INDEPENDENT_AMBULATORY_CARE_PROVIDER_SITE_OTHER): Payer: Medicare Other | Admitting: *Deleted

## 2019-08-23 DIAGNOSIS — I428 Other cardiomyopathies: Secondary | ICD-10-CM | POA: Diagnosis not present

## 2019-08-24 DIAGNOSIS — R413 Other amnesia: Secondary | ICD-10-CM | POA: Diagnosis not present

## 2019-08-24 DIAGNOSIS — E1122 Type 2 diabetes mellitus with diabetic chronic kidney disease: Secondary | ICD-10-CM | POA: Diagnosis not present

## 2019-08-24 DIAGNOSIS — N183 Chronic kidney disease, stage 3 (moderate): Secondary | ICD-10-CM | POA: Diagnosis not present

## 2019-08-24 DIAGNOSIS — N200 Calculus of kidney: Secondary | ICD-10-CM | POA: Diagnosis not present

## 2019-08-24 DIAGNOSIS — N401 Enlarged prostate with lower urinary tract symptoms: Secondary | ICD-10-CM | POA: Diagnosis not present

## 2019-08-24 DIAGNOSIS — I129 Hypertensive chronic kidney disease with stage 1 through stage 4 chronic kidney disease, or unspecified chronic kidney disease: Secondary | ICD-10-CM | POA: Diagnosis not present

## 2019-08-24 DIAGNOSIS — Z23 Encounter for immunization: Secondary | ICD-10-CM | POA: Diagnosis not present

## 2019-08-24 DIAGNOSIS — I5022 Chronic systolic (congestive) heart failure: Secondary | ICD-10-CM | POA: Diagnosis not present

## 2019-08-24 LAB — CUP PACEART REMOTE DEVICE CHECK
Battery Remaining Longevity: 19 mo
Battery Voltage: 2.93 V
Brady Statistic AP VP Percent: 1.38 %
Brady Statistic AP VS Percent: 0.01 %
Brady Statistic AS VP Percent: 98.27 %
Brady Statistic AS VS Percent: 0.34 %
Brady Statistic RA Percent Paced: 1.37 %
Brady Statistic RV Percent Paced: 99.57 %
Date Time Interrogation Session: 20200929144226
HighPow Impedance: 79 Ohm
Implantable Lead Implant Date: 20170713
Implantable Lead Implant Date: 20170713
Implantable Lead Implant Date: 20170713
Implantable Lead Location: 753858
Implantable Lead Location: 753859
Implantable Lead Location: 753860
Implantable Lead Model: 4598
Implantable Lead Model: 5076
Implantable Pulse Generator Implant Date: 20170713
Lead Channel Impedance Value: 361 Ohm
Lead Channel Impedance Value: 399 Ohm
Lead Channel Impedance Value: 399 Ohm
Lead Channel Impedance Value: 418 Ohm
Lead Channel Impedance Value: 418 Ohm
Lead Channel Impedance Value: 475 Ohm
Lead Channel Impedance Value: 513 Ohm
Lead Channel Impedance Value: 551 Ohm
Lead Channel Impedance Value: 703 Ohm
Lead Channel Impedance Value: 722 Ohm
Lead Channel Impedance Value: 817 Ohm
Lead Channel Impedance Value: 817 Ohm
Lead Channel Impedance Value: 817 Ohm
Lead Channel Pacing Threshold Amplitude: 0.375 V
Lead Channel Pacing Threshold Amplitude: 0.5 V
Lead Channel Pacing Threshold Amplitude: 3.25 V
Lead Channel Pacing Threshold Pulse Width: 0.4 ms
Lead Channel Pacing Threshold Pulse Width: 0.4 ms
Lead Channel Pacing Threshold Pulse Width: 0.8 ms
Lead Channel Sensing Intrinsic Amplitude: 2.25 mV
Lead Channel Sensing Intrinsic Amplitude: 2.25 mV
Lead Channel Sensing Intrinsic Amplitude: 6.375 mV
Lead Channel Sensing Intrinsic Amplitude: 6.375 mV
Lead Channel Setting Pacing Amplitude: 2 V
Lead Channel Setting Pacing Amplitude: 2.5 V
Lead Channel Setting Pacing Amplitude: 4.25 V
Lead Channel Setting Pacing Pulse Width: 0.4 ms
Lead Channel Setting Pacing Pulse Width: 0.8 ms
Lead Channel Setting Sensing Sensitivity: 0.3 mV

## 2019-08-26 IMAGING — US US RENAL
1 series · 14 of 25 positions shown · non-contrast
Comparison: CT abdomen and pelvis 07/18/2018

CLINICAL DATA: Stage III chronic kidney disease, hypertension,
diabetes mellitus

EXAM:
RENAL / URINARY TRACT ULTRASOUND COMPLETE

[Series 1: us renal · 0.33mm/px · 14 of 39 slices shown]
[im 1/39]
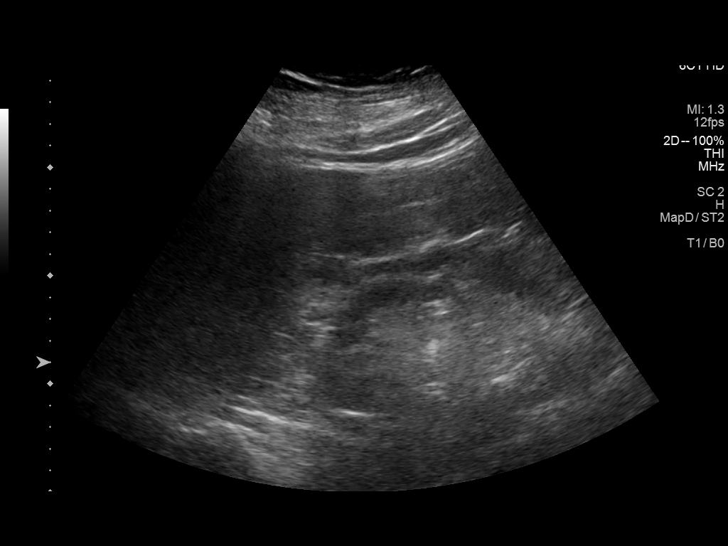
[im 4/39]
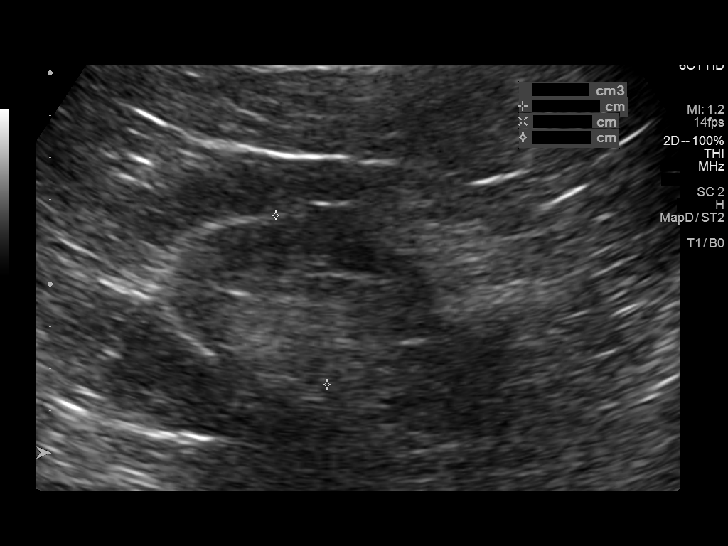
[im 7/39]
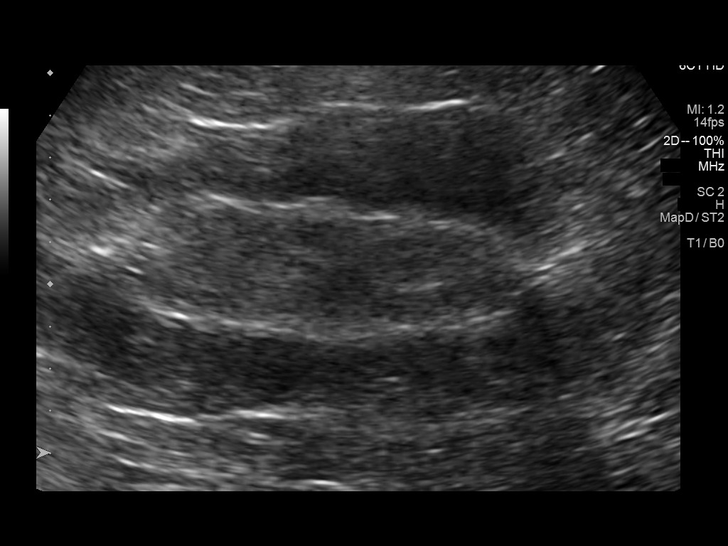
[im 10/39]
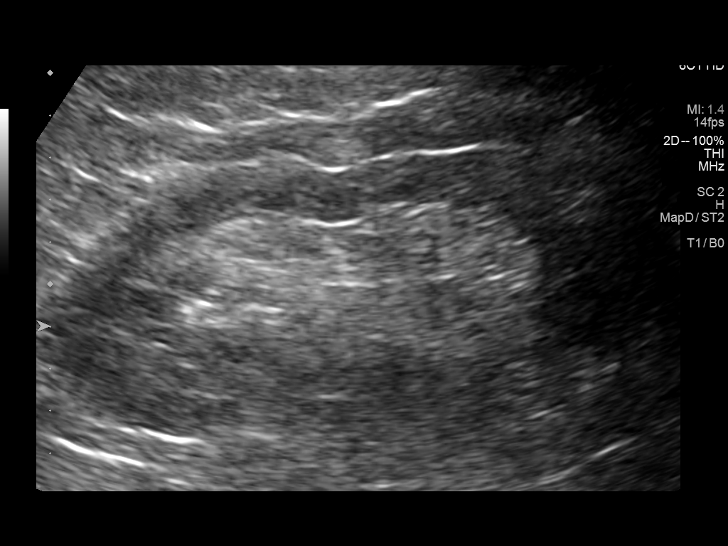
[im 13/39]
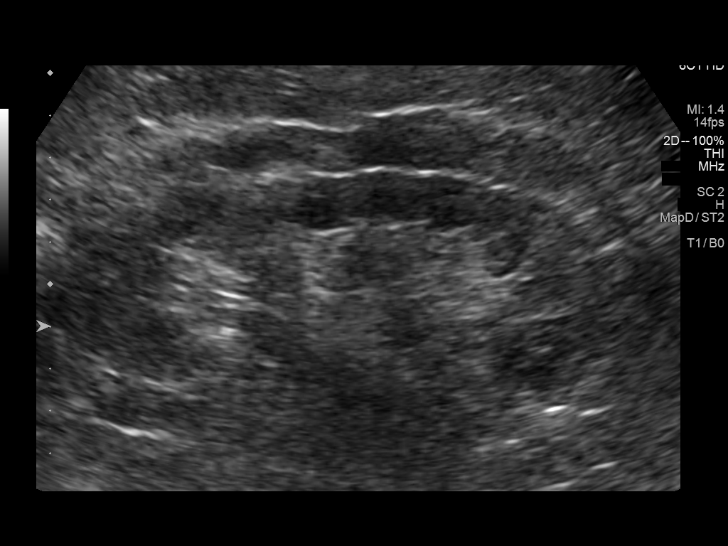
[im 15/39]
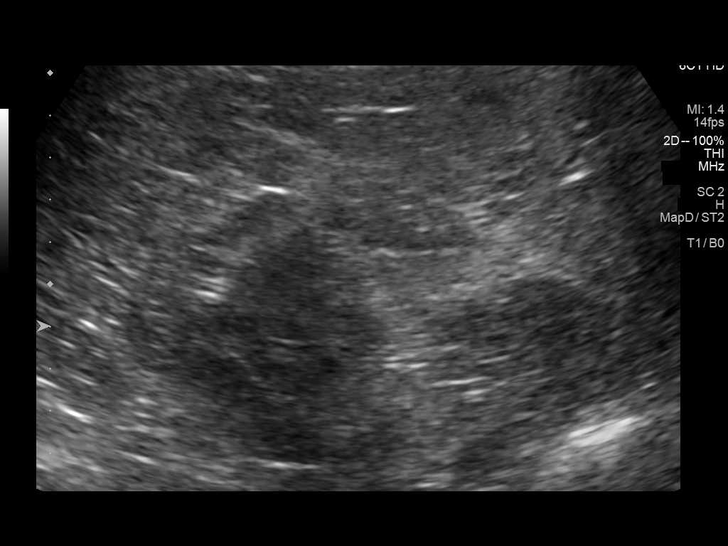
[im 18/39]
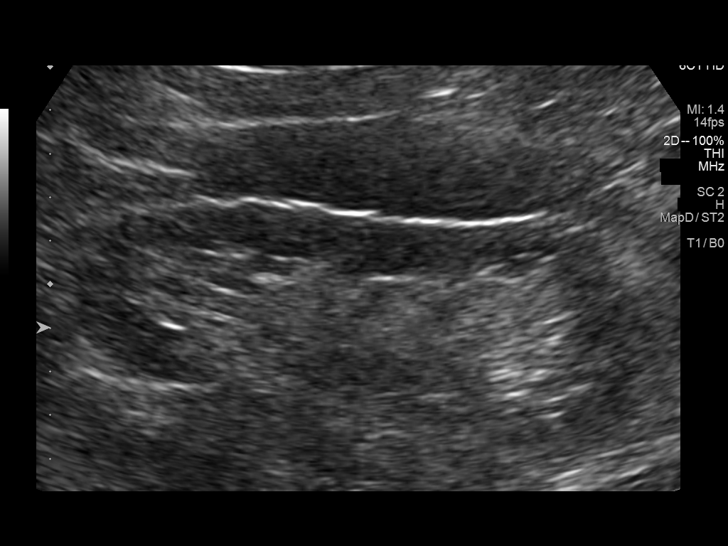
[im 21/39]
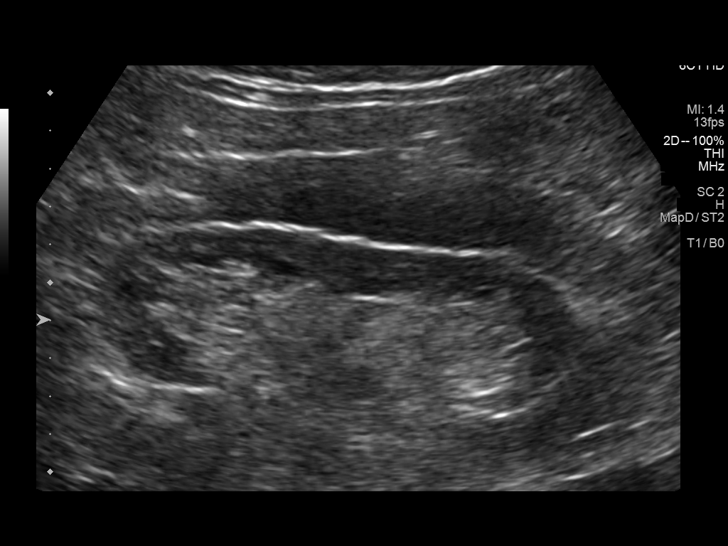
[im 24/39]
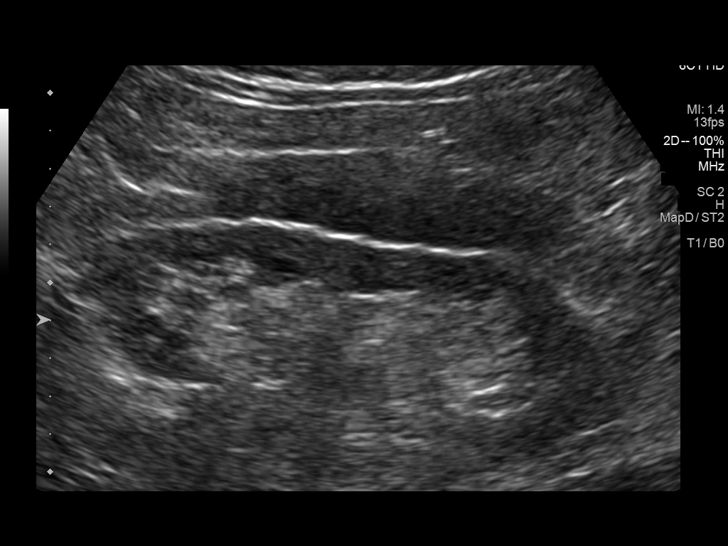
[im 26/39]
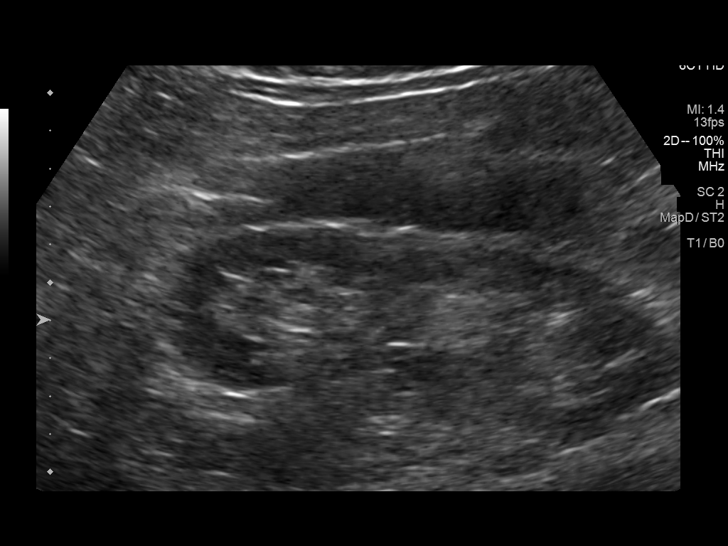
[im 29/39]
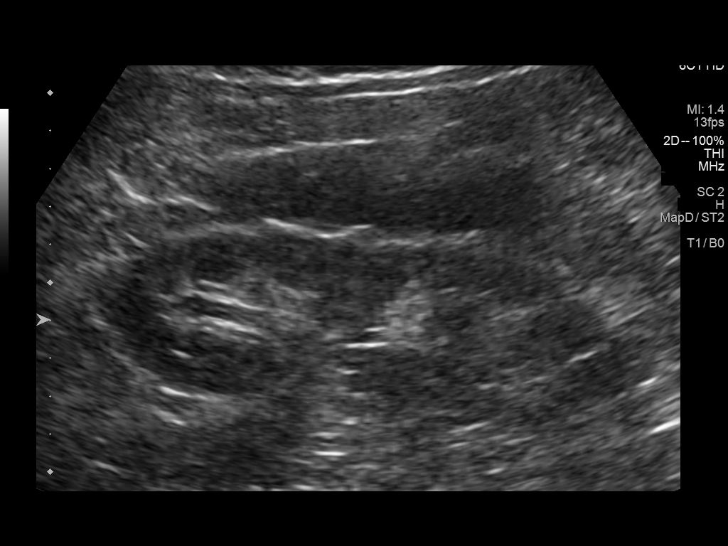
[im 32/39]
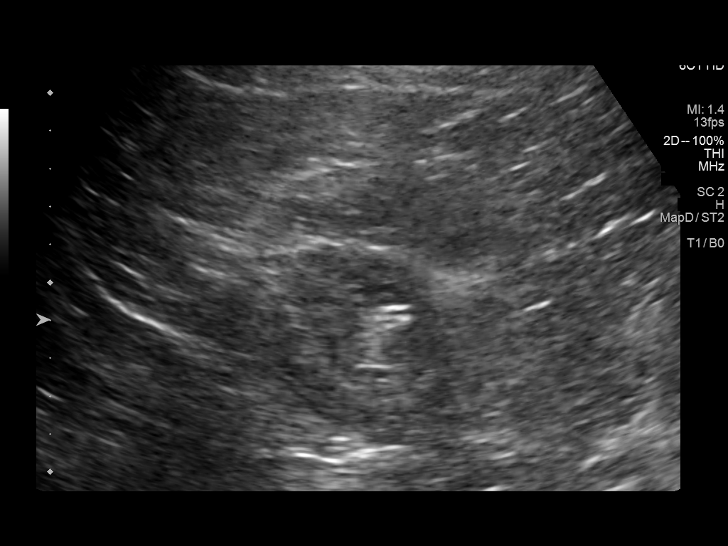
[im 35/39]
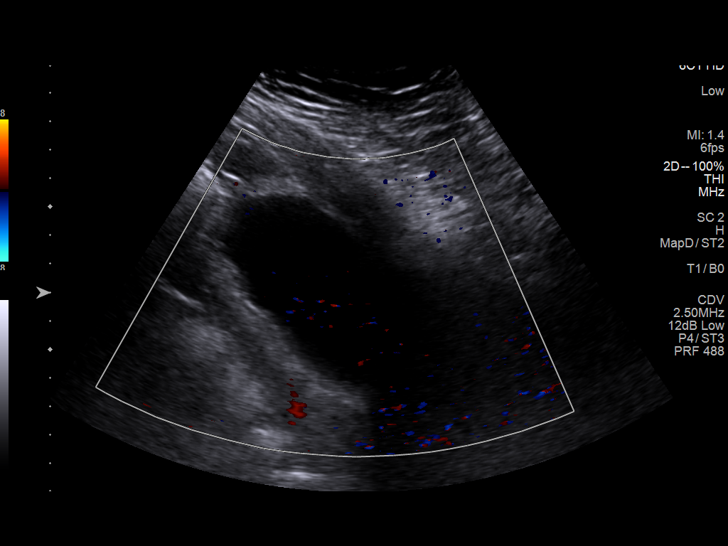
[im 39/39]
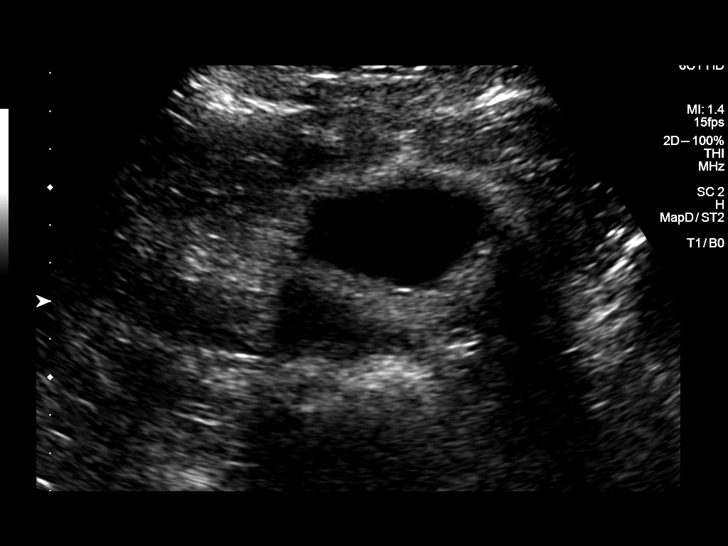

[14 of 25 positions shown; findings below may reference images not displayed]

FINDINGS: Right Kidney:

Renal measurements: 14.3 x 5.8 x 4.2 cm = volume: 180 mL. Marked
cortical thinning. Minimally increased cortical echogenicity. No
mass, hydronephrosis or shadowing calcification.

Left Kidney:

Renal measurements: 13.5 x 4.3 x 4.5 cm = volume: 139 mL. Cortical
thinning period increased cortical echogenicity. No mass,
hydronephrosis or shadowing calcification.

Bladder:

Appears normal for degree of bladder distention.
IMPRESSION: Cortical atrophy and medical renal disease changes of both kidneys.

No evidence of renal mass or hydronephrosis.

## 2019-08-27 DIAGNOSIS — S39012A Strain of muscle, fascia and tendon of lower back, initial encounter: Secondary | ICD-10-CM | POA: Diagnosis not present

## 2019-09-02 ENCOUNTER — Encounter: Payer: Self-pay | Admitting: Cardiology

## 2019-09-02 NOTE — Progress Notes (Signed)
Remote ICD transmission.   

## 2019-09-05 ENCOUNTER — Ambulatory Visit (INDEPENDENT_AMBULATORY_CARE_PROVIDER_SITE_OTHER): Payer: Medicare Other

## 2019-09-05 DIAGNOSIS — Z95810 Presence of automatic (implantable) cardiac defibrillator: Secondary | ICD-10-CM

## 2019-09-05 DIAGNOSIS — I5022 Chronic systolic (congestive) heart failure: Secondary | ICD-10-CM

## 2019-09-06 ENCOUNTER — Telehealth: Payer: Self-pay

## 2019-09-06 NOTE — Progress Notes (Signed)
EPIC Encounter for ICM Monitoring  Patient Name: CLAUDIE RATHBONE is a 78 y.o. male Date: 09/06/2019 Primary Care Physican: Serita Grammes, MD Primary Cardiologist:[place] Electrophysiologist: Allred Bi-V Pacing: 99.4% 8/26/2020Weight:270 lbs  Clinical Status (Since 23-Aug-2019)  AT/AF  15 episodes  Time in AT/AF  1.0 hr/day (4.2%)  Longest AT/AF   5 hours  Attempted call to wife and unable to reach.  Left detailed message per DPR regarding transmission. Transmission reviewed.    Optivol thoracic impedance normal   Prescribed: Torsemide10mg  Alternate taking 20 mg (2 tablets) and 10 mg (1 tablet) every other day.  Labs: 04/27/2019 Creatinine1.96Elaina Pattee, Potassium4.7, Sodium139, [id number] 04/12/2019 Creatinine1.89, BUN22, Potassium4.8, [id number], [id number] 5/11/20020 Creatinine 2.04, BUN 23, Potassium 5.3, Sodium 140, GFR 31-36 03/14/2019 Creatinine 1.73, BUN 17, Potassium 5.0, Sodium 139, GFR 38-43 02/07/2019 Creatinine1.62, BUN12, Potassium4.9, Sodium140, GFR41-47 01/24/2019 Creatinine2.90, BUN25, Potassium3.9, Sodium140, GFR20-23  01/13/2019 Creatinine2.30, BUN21, Potassium4.5, Sodium136, [id number] A complete set of results can be found in Results Review.  Recommendations: Left voice mail with ICM number and encouraged to call if experiencing any fluid symptoms.  Follow-up plan: ICM clinic phone appointment on 10/10/2019.   91 day device clinic remote transmission 11/22/2019.   Copy of ICM check sent to Dr. Rayann Heman.   3 month ICM trend: 09/05/2019    1 Year ICM trend:       Rosalene Billings, RN 09/06/2019 3:45 PM

## 2019-09-06 NOTE — Telephone Encounter (Signed)
Remote ICM transmission received.  Attempted call to wife regarding ICM remote transmission and left  detailed message per DPR.  Advised to return call for any fluid symptoms or questions. Next ICM remote transmission scheduled 10/10/2019.

## 2019-09-17 DIAGNOSIS — L300 Nummular dermatitis: Secondary | ICD-10-CM | POA: Diagnosis not present

## 2019-09-17 DIAGNOSIS — L299 Pruritus, unspecified: Secondary | ICD-10-CM | POA: Diagnosis not present

## 2019-09-23 ENCOUNTER — Other Ambulatory Visit: Payer: Self-pay

## 2019-09-27 ENCOUNTER — Other Ambulatory Visit: Payer: Self-pay

## 2019-09-27 ENCOUNTER — Encounter: Payer: Self-pay | Admitting: Endocrinology

## 2019-09-27 ENCOUNTER — Ambulatory Visit (INDEPENDENT_AMBULATORY_CARE_PROVIDER_SITE_OTHER): Payer: Medicare Other | Admitting: Endocrinology

## 2019-09-27 VITALS — BP 122/56 | HR 101 | Ht 72.0 in | Wt 270.2 lb

## 2019-09-27 DIAGNOSIS — N1831 Chronic kidney disease, stage 3a: Secondary | ICD-10-CM

## 2019-09-27 DIAGNOSIS — E119 Type 2 diabetes mellitus without complications: Secondary | ICD-10-CM

## 2019-09-27 DIAGNOSIS — E1122 Type 2 diabetes mellitus with diabetic chronic kidney disease: Secondary | ICD-10-CM

## 2019-09-27 DIAGNOSIS — Z794 Long term (current) use of insulin: Secondary | ICD-10-CM

## 2019-09-27 DIAGNOSIS — E1121 Type 2 diabetes mellitus with diabetic nephropathy: Secondary | ICD-10-CM | POA: Diagnosis not present

## 2019-09-27 LAB — POCT GLYCOSYLATED HEMOGLOBIN (HGB A1C): Hemoglobin A1C: 7.9 % — AB (ref 4.0–5.6)

## 2019-09-27 NOTE — Patient Instructions (Signed)
Please continue the same insulin.   On this type of insulin schedule, you should eat meals on a regular schedule.  If a meal is missed or significantly.  delayed, your blood sugar could go low.   check your blood sugar twice a day.  vary the time of day when you check, between before the 3 meals, and at bedtime.  also check if you have symptoms of your blood sugar being too high or too low.  please keep a record of the readings and bring it to your next appointment here.  please call us sooner if your blood sugar goes below 70, or if you have a lot of readings over 200.   If you have any blood sugar under 80, please write on the paper why you think that was.   Please come back for a follow-up appointment in 2-3 months.

## 2019-09-27 NOTE — Progress Notes (Signed)
Subjective:    Patient ID: Justin Hill, male    DOB: 15-Oct-1941, 78 y.o.   MRN: [id number]  HPI  Pt returns for f/u of DM: DM type: Insulin-requiring type 2. Dx'ed: 1829 Complications: CHF, toe ulcer, partial toe amputation, polyneuropathy, and renal insufficiency.  Therapy: insulin since 2012.  DKA: never.   Severe hypoglycemia: never.    Pancreatitis: never.     Other: he takes human insulin, due to cost; in October of 2014, he was changed from multiple daily injections to BID, due to persistently high a1c.  wife gives insulin, due to his memory loss.    Interval history: wife states cbg's vary from 112-200.  It is in general highest at HS.  He has mild hypoglycemia approx once per month.  No new sxs.  He had a steroid injection 1 week ago, for itching.  This increased cbg's to the 200's.  no recent 2 Fe infusions.   Past Medical History:  Diagnosis Date  . Altered mental status   . Cardiomyopathy   . CHF (congestive heart failure) ([place])   . Chronic kidney disease (CKD), stage III (moderate)   . Communicating hydrocephalus ([place])   . Diabetes mellitus    type 2  . Diverticulosis   . History of kidney stones   . Hypercholesterolemia   . Hyperglycemia   . Hypertension   . Paroxysmal atrial fibrillation ([facility])   . Personal history of kidney stones   . Pneumonia   . Sleep apnea    uses cpap    Past Surgical History:  Procedure Laterality Date  . ATRIAL FLUTTER ABLATION N/A 01/19/2015   Procedure: ATRIAL FLUTTER ABLATION;  Surgeon: [name], MD;  Location: [facility] CATH LAB;  Service: Cardiovascular;  Laterality: N/A;  . CARDIAC CATHETERIZATION N/A 05/02/2016   Procedure: Right/Left Heart Cath and Coronary Angiography;  Surgeon: Larey Dresser, MD;  Location: [facility] CV LAB;  Service: Cardiovascular;  Laterality: N/A;  . COLONOSCOPY N/A 12/29/2013   Procedure: COLONOSCOPY;  Surgeon: Lafayette Dragon, MD;  Location: [facility] ENDOSCOPY;  Service: Endoscopy;  Laterality: N/A;  . EP  IMPLANTABLE DEVICE N/A 06/05/2016   MDT Hillery Aldo XT CRTD implanted by Dr Rayann Heman   . EYE SURGERY     cataract surgery (not sure which eye)  . [place]   left. Muscle reattachment  . TOE SURGERY Right 12/2012   2nd toe  . TONSILLECTOMY    . VENTRICULOPERITONEAL SHUNT Right 11/20/2017   Procedure: SHUNT PLACEMENT RIGHT;  Surgeon: Newman Pies, MD;  Location: [facility];  Service: Neurosurgery;  Laterality: Right;  SHUNT PLACEMENT RIGHT    Social History   Socioeconomic History  . Marital status: Married    Spouse name: Rosemarie Beath  . Number of children: 1  . Years of education: 61  . Highest education level: Not on file  Occupational History  . Occupation: retired  Scientific laboratory technician  . Financial resource strain: Not on file  . Food insecurity    Worry: Not on file    Inability: Not on file  . Transportation needs    Medical: Not on file    Non-medical: Not on file  Tobacco Use  . Smoking status: Former Smoker    Types: Cigarettes  . Smokeless tobacco: Never Used  Substance and Sexual Activity  . Alcohol use: No  . Drug use: No  . Sexual activity: Not on file  Lifestyle  . Physical activity    Days per  week: Not on file    Minutes per session: Not on file  . Stress: Not on file  Relationships  . Social Herbalist on phone: Not on file    Gets together: Not on file    Attends religious service: Not on file    Active member of club or organization: Not on file    Attends meetings of clubs or organizations: Not on file    Relationship status: Not on file  . Intimate partner violence    Fear of current or ex partner: Not on file    Emotionally abused: Not on file    Physically abused: Not on file    Forced sexual activity: Not on file  Other Topics Concern  . Not on file  Social History Narrative   Regular exercise-no   Rare caffeine use     Current Outpatient Medications on File Prior to Visit  Medication Sig Dispense Refill  . Ascorbic Acid  (VITAMIN C) 1000 MG tablet Take 1 tablet by mouth 2 (two) times daily.     . carvedilol (COREG) 25 MG tablet Take 1 tablet (25 mg total) by mouth 2 (two) times daily with a meal. 180 tablet 3  . Cinnamon 500 MG TABS Take 1 tablet (500 mg total) by mouth daily.    . fenofibrate 54 MG tablet Take 1 tablet by mouth once daily 90 tablet 0  . insulin NPH-regular Human (70-30) 100 UNIT/ML injection 130 units with breakfast, and 50 units with supper, and syringes 2/day 100 mL 11  . loratadine (CLARITIN) 10 MG tablet Take 1 tablet by mouth 2 (two) times daily.     . Multiple Vitamin (MULTIVITAMIN) tablet Take 1 tablet by mouth daily.      Marland Kitchen omeprazole (PRILOSEC) 40 MG capsule Take 40 mg by mouth daily. OTC    . sacubitril-valsartan (ENTRESTO) 24-26 MG Take 1 tablet by mouth 2 (two) times daily. 180 tablet 3  . simvastatin (ZOCOR) 40 MG tablet Take 1 tablet (40 mg total) by mouth at bedtime. 90 tablet 3  . tadalafil (CIALIS) 5 MG tablet Take 5 mg by mouth daily.  3  . tamsulosin (FLOMAX) 0.4 MG CAPS capsule Take 0.4 mg by mouth daily.    Marland Kitchen torsemide (DEMADEX) 10 MG tablet Alternate taking 20 mg (2 tablets) and 10 mg (1 tablet) every other day. 90 tablet 3  . XARELTO 20 MG TABS tablet TAKE 1 TABLET BY MOUTH ONCE DAILY WITH  SUPPER 90 tablet 3   No current facility-administered medications on file prior to visit.     Allergies  Allergen Reactions  . Penicillins Anaphylaxis and Other (See Comments)    SYNCOPE PATIENT HAS HAD A PCN REACTION WITH IMMEDIATE RASH, FACIAL/TONGUE/THROAT SWELLING, SOB, OR LIGHTHEADEDNESS WITH HYPOTENSION:  #  #  #  YES  #  #  #   Has patient had a PCN reaction causing severe rash involving mucus membranes or skin necrosis: no PATIENT HAS HAD A PCN REACTION THAT REQUIRED HOSPITALIZATION:  #  #  #  YES  #  #  #  Has patient had a PCN reaction occurring within the last 10 years: no    . Aspirin Other (See Comments)    Burps, burning, stomach pains, etc  . Lisinopril Cough     Severe coughing    Family History  Problem Relation Age of Onset  . Heart failure Maternal Grandmother   . Diabetes type II Sister 40  .  Cancer Sister   . Hyperlipidemia Other        Parent  . Stroke Other        Parent  . Hypertension Other        Parent  . Diabetes Other        Parent  . Colon cancer Neg Hx   . Heart attack Neg Hx     BP (!) 122/56 (BP Location: Right Arm, Patient Position: Sitting, Cuff Size: Large)   Pulse (!) 101   Ht 6' (1.829 m)   Wt 270 lb 3.2 oz (122.6 kg)   SpO2 95%   BMI 36.65 kg/m   Review of Systems Denies LOC.      Objective:   Physical Exam VITAL SIGNS:  See vs page GENERAL: no distress Pulses: dorsalis pedis intact bilat.   MSK: no deformity of the feet.  Distal right 2nd toe is absent.  CV: 1+  bilat leg edema.  Skin:  no ulcer on the feet.  normal color and temp on the feet. Neuro: sensation is intact to touch on the feet, but decreased from normal Ext: there is bilateral onychomycosis of the toenails.    Lab Results  Component Value Date   HGBA1C 7.9 (A) 09/27/2019   Lab Results  Component Value Date   CREATININE 1.96 (H) 04/27/2019   BUN 21 04/27/2019   NA 139 04/27/2019   K 4.7 04/27/2019   CL 100 04/27/2019   CO2 30 (H) 04/27/2019      Assessment & Plan:  Insulin-requiring type 2 DM, with PN: this is the best control this pt should aim for, given this regimen, which does match insulin to his changing needs throughout the day.  Renal failure: in this setting, he needs most of his insulin in the morning.  Memory loss: this also limits aggressiveness of glycemic control.   Patient Instructions  Please continue the same insulin.   On this type of insulin schedule, you should eat meals on a regular schedule.  If a meal is missed or significantly.  delayed, your blood sugar could go low.   check your blood sugar twice a day.  vary the time of day when you check, between before the 3 meals, and at bedtime.  also check if  you have symptoms of your blood sugar being too high or too low.  please keep a record of the readings and bring it to your next appointment here.  please call us sooner if your blood sugar goes below 70, or if you have a lot of readings over 200.   If you have any blood sugar under 80, please write on the paper why you think that was.   Please come back for a follow-up appointment in 2-3 months.

## 2019-10-10 ENCOUNTER — Ambulatory Visit (INDEPENDENT_AMBULATORY_CARE_PROVIDER_SITE_OTHER): Payer: Medicare Other

## 2019-10-10 DIAGNOSIS — I5022 Chronic systolic (congestive) heart failure: Secondary | ICD-10-CM

## 2019-10-10 DIAGNOSIS — Z95810 Presence of automatic (implantable) cardiac defibrillator: Secondary | ICD-10-CM | POA: Diagnosis not present

## 2019-10-11 DIAGNOSIS — N401 Enlarged prostate with lower urinary tract symptoms: Secondary | ICD-10-CM | POA: Diagnosis not present

## 2019-10-11 DIAGNOSIS — Z2821 Immunization not carried out because of patient refusal: Secondary | ICD-10-CM | POA: Diagnosis not present

## 2019-10-11 DIAGNOSIS — Z125 Encounter for screening for malignant neoplasm of prostate: Secondary | ICD-10-CM | POA: Diagnosis not present

## 2019-10-11 DIAGNOSIS — Z0000 Encounter for general adult medical examination without abnormal findings: Secondary | ICD-10-CM | POA: Diagnosis not present

## 2019-10-11 DIAGNOSIS — D638 Anemia in other chronic diseases classified elsewhere: Secondary | ICD-10-CM | POA: Diagnosis not present

## 2019-10-12 NOTE — Progress Notes (Signed)
EPIC Encounter for ICM Monitoring  Patient Name: Justin Hill is a 78 y.o. male Date: 10/12/2019 Primary Care Physican: Serita Grammes, MD Primary Cardiologist:[place] Electrophysiologist: [facility] Bi-V Pacing: 99.9% 11/18/2020Weight:270 lbs  Clinical Status (05-Sep-2019 to 10-Oct-2019)  Treated VT/VF     0 episodes   AT/AF                 44 episodes   Time in AT/AF     1.0 hr/day (4.1%)  Observations (3) (05-Sep-2019 to 10-Oct-2019)  AT/AF >= 6 hr for 2 days.  Night heart rate over 85 bpm for 7 days.  Patient Activity less than 1 hr/day for 2 weeks.  Spoke with wife and patient is asymptomatic for fluid accumulation.  Voices no complaints today.  Had PCP appt on 10/11/2019.  Optivol thoracic impedance normal  Prescribed:Torsemide10mg  Alternate taking 20 mg (2 tablets) and 10 mg (1 tablet) every other day.  Labs: 07/06/2019 Creatinine 1.74, BUN 16, Potassium 4.8, Sodium 139, GFR 37-43 04/27/2019 Creatinine1.96, BUN21, Potassium4.7, Sodium139, QMG50-03 04/12/2019 Creatinine1.89, BUN22, Potassium4.8, Sodium139, BCW88-89 5/11/20020 Creatinine 2.04, BUN 23, Potassium 5.3, Sodium 140, GFR 31-36 03/14/2019 Creatinine 1.73, BUN 17, Potassium 5.0, Sodium 139, GFR 38-43 02/07/2019 Creatinine1.62, BUN12, Potassium4.9, Sodium140, GFR41-47 01/24/2019 Creatinine2.90, BUN25, Potassium3.9, Sodium140, GFR20-23  01/13/2019 Creatinine2.30, BUN21, Potassium4.5, Sodium136, VQX45-03 A complete set of results can be found in Results Review.  Recommendations: No changes and encouraged to call if experiencing any fluid symptoms.  Follow-up plan: ICM clinic phone appointment on 11/23/2019.   91 day device clinic remote transmission 11/22/2019.   Advised patient is overdue to schedule an appt with Dr Aundra Dubin and to call office to schedule.  Copy of ICM check sent to Dr. Rayann Heman.   3 month ICM trend: 10/10/2019    1 Year ICM trend:       Rosalene Billings, RN 10/12/2019 10:34 AM

## 2019-10-14 ENCOUNTER — Encounter (HOSPITAL_COMMUNITY): Payer: Self-pay | Admitting: Cardiology

## 2019-10-14 ENCOUNTER — Other Ambulatory Visit: Payer: Self-pay

## 2019-10-14 ENCOUNTER — Ambulatory Visit (HOSPITAL_COMMUNITY)
Admission: RE | Admit: 2019-10-14 | Discharge: 2019-10-14 | Disposition: A | Discharge status: Home / Self Care | Payer: Medicare Other | Source: Ambulatory Visit | Attending: Cardiology | Admitting: Cardiology

## 2019-10-14 VITALS — BP 126/58 | HR 94 | Wt 271.0 lb

## 2019-10-14 DIAGNOSIS — Z7901 Long term (current) use of anticoagulants: Secondary | ICD-10-CM | POA: Insufficient documentation

## 2019-10-14 DIAGNOSIS — I509 Heart failure, unspecified: Secondary | ICD-10-CM | POA: Insufficient documentation

## 2019-10-14 DIAGNOSIS — G912 (Idiopathic) normal pressure hydrocephalus: Secondary | ICD-10-CM | POA: Insufficient documentation

## 2019-10-14 DIAGNOSIS — N183 Chronic kidney disease, stage 3 unspecified: Secondary | ICD-10-CM | POA: Diagnosis not present

## 2019-10-14 DIAGNOSIS — I447 Left bundle-branch block, unspecified: Secondary | ICD-10-CM | POA: Insufficient documentation

## 2019-10-14 DIAGNOSIS — E1122 Type 2 diabetes mellitus with diabetic chronic kidney disease: Secondary | ICD-10-CM | POA: Diagnosis not present

## 2019-10-14 DIAGNOSIS — Z8249 Family history of ischemic heart disease and other diseases of the circulatory system: Secondary | ICD-10-CM | POA: Insufficient documentation

## 2019-10-14 DIAGNOSIS — E1142 Type 2 diabetes mellitus with diabetic polyneuropathy: Secondary | ICD-10-CM | POA: Insufficient documentation

## 2019-10-14 DIAGNOSIS — Z79899 Other long term (current) drug therapy: Secondary | ICD-10-CM | POA: Insufficient documentation

## 2019-10-14 DIAGNOSIS — Z823 Family history of stroke: Secondary | ICD-10-CM | POA: Diagnosis not present

## 2019-10-14 DIAGNOSIS — Z87891 Personal history of nicotine dependence: Secondary | ICD-10-CM | POA: Diagnosis not present

## 2019-10-14 DIAGNOSIS — G4733 Obstructive sleep apnea (adult) (pediatric): Secondary | ICD-10-CM | POA: Diagnosis not present

## 2019-10-14 DIAGNOSIS — I4891 Unspecified atrial fibrillation: Secondary | ICD-10-CM | POA: Insufficient documentation

## 2019-10-14 DIAGNOSIS — I130 Hypertensive heart and chronic kidney disease with heart failure and stage 1 through stage 4 chronic kidney disease, or unspecified chronic kidney disease: Secondary | ICD-10-CM | POA: Insufficient documentation

## 2019-10-14 DIAGNOSIS — Z95810 Presence of automatic (implantable) cardiac defibrillator: Secondary | ICD-10-CM | POA: Insufficient documentation

## 2019-10-14 DIAGNOSIS — Z982 Presence of cerebrospinal fluid drainage device: Secondary | ICD-10-CM | POA: Insufficient documentation

## 2019-10-14 DIAGNOSIS — I4892 Unspecified atrial flutter: Secondary | ICD-10-CM | POA: Diagnosis not present

## 2019-10-14 DIAGNOSIS — E7800 Pure hypercholesterolemia, unspecified: Secondary | ICD-10-CM

## 2019-10-14 DIAGNOSIS — E669 Obesity, unspecified: Secondary | ICD-10-CM | POA: Diagnosis not present

## 2019-10-14 DIAGNOSIS — Z6836 Body mass index (BMI) 36.0-36.9, adult: Secondary | ICD-10-CM | POA: Insufficient documentation

## 2019-10-14 DIAGNOSIS — I480 Paroxysmal atrial fibrillation: Secondary | ICD-10-CM | POA: Diagnosis not present

## 2019-10-14 DIAGNOSIS — E785 Hyperlipidemia, unspecified: Secondary | ICD-10-CM | POA: Insufficient documentation

## 2019-10-14 DIAGNOSIS — Z794 Long term (current) use of insulin: Secondary | ICD-10-CM | POA: Diagnosis not present

## 2019-10-14 DIAGNOSIS — I5022 Chronic systolic (congestive) heart failure: Secondary | ICD-10-CM

## 2019-10-14 DIAGNOSIS — I428 Other cardiomyopathies: Secondary | ICD-10-CM | POA: Diagnosis not present

## 2019-10-14 LAB — BASIC METABOLIC PANEL
Anion gap: 9 (ref 5–15)
BUN: 20 mg/dL (ref 8–23)
CO2: 30 mmol/L (ref 22–32)
Calcium: 9 mg/dL (ref 8.9–10.3)
Chloride: 99 mmol/L (ref 98–111)
Creatinine, Ser: 1.85 mg/dL — ABNORMAL HIGH (ref 0.61–1.24)
GFR calc Af Amer: 40 mL/min — ABNORMAL LOW (ref >60)
GFR calc non Af Amer: 35 mL/min — ABNORMAL LOW (ref >60)
Glucose, Bld: 349 mg/dL — ABNORMAL HIGH (ref 70–99)
Potassium: 5 mmol/L (ref 3.5–5.1)
Sodium: 138 mmol/L (ref 135–145)

## 2019-10-14 LAB — LIPID PANEL
Cholesterol: 148 mg/dL (ref 0–200)
HDL: 46 mg/dL (ref >40)
LDL Cholesterol: 71 mg/dL (ref 0–99)
Total CHOL/HDL Ratio: 3.2 RATIO
Triglycerides: 155 mg/dL — ABNORMAL HIGH (ref <150)
VLDL: 31 mg/dL (ref 0–40)

## 2019-10-14 MED ORDER — RIVAROXABAN 15 MG PO TABS: 15.0000 mg | ORAL_TABLET | Freq: Every day | ORAL | 6 refills | Status: AC

## 2019-10-14 NOTE — Patient Instructions (Addendum)
DECREASE Xarelto to 15mg  (1 tab) daily at supper   Labs today We will only contact you if something comes back abnormal or we need to make some changes. Otherwise no news is good news!   EKG done in office today!   Your physician recommends that you schedule a follow-up appointment in: 3 months with Dr Aundra Dubin    Please call office at [phone] option 2 if you have any questions or concerns.   At the [facility], you and your health needs are our priority. As part of our continuing mission to provide you with exceptional heart care, we have created designated Provider Care Teams. These Care Teams include your primary Cardiologist (physician) and Advanced Practice Providers (APPs- Physician Assistants and Nurse Practitioners) who all work together to provide you with the care you need, when you need it.   You may see any of the following providers on your designated Care Team at your next follow up: Marland Kitchen Dr Glori Bickers . Dr Loralie Champagne . Darrick Grinder, NP . Lyda Jester, PA   Please be sure to bring in all your medications bottles to every appointment.

## 2019-10-16 NOTE — Progress Notes (Signed)
Patient ID: Justin Hill, male   DOB: Jul 04, 1941, 78 y.o.   MRN: [id number] PCP: Dr. Camillia Herter Cardiology: Dr. Aundra Dubin  78 y.o. with history CKD, HTN, diabetes, chronic LBBB, and nonischemic cardiomyopathy presents for followup of CHF.  His cardiomyopathy has been known for years.  He had a LHC in 2008 showing mild nonobstructive CAD.     Justin Hill was admitted in 2/16 with atrial flutter degenerating into atrial fibrillation.  He was initially cardioverted, then atrial flutter recurred.  He had atrial flutter ablation by Dr Rayann Heman in 2/16.  He thinks that he has been in NSR since that time, no further palpitations. He is on Xarelto now with no melena or BRBPR.    He is s/p placement of Medtronic CRT-D device.  Echo in 1/18 showed improvement in EF to 40-45%.  Echo in 5/20 showed EF up to 55-60%.   He developed normal pressure hydrocephalus and had VP shunt placed.  This has helped with his walking and some with his memory.   His atrial fibrillation burden has been higher by monitoring recently, up to 4.1% for the last month.  He is in NSR today.  He is not very active and not very interactive today.  His wife talks for him for the most part.  Weight is up 16 lbs over the last 6-8 months.  He has some swelling in his feet.  He is short of breath with moderate exertion though not short of breath walking around the house.  No chest pain.  No lightheadedness.  He has had several falls due to poor balance.  He is using CPAP.    REDS clip 28%  Medtronic device interrogation: occasional atrial fibrillation episodes, >99% BiV pacing, thoracic impedance stable.      ECG (personally reviewed): NSR, BiV paced  Labs (5/14): K 4, creatinine 1.4, LFTs normal, LDL 46, HDL 37 Labs (1/15): K 4.4, creatinine 1.46, LDL 74, HDL 35 Labs (4/15); LDL 96, HDL 44, K 5.2, creatinine 1.4 Labs (1/16): K 4.9, creatinine 1.36 Labs (2/16): K 4.5, creatinine 1.56, BNP 412 Labs (9/16): K 4.8, creatinine 1.33, hgb  14.7 Labs (11/16): K 4.3, creatinine 1.37, BNP 52 Labs (1/17): K 4.5 => 5, creatinine 1.35 => 1.17, HCT 47.4 Labs (2/17): K 4.9, creatinine 1.35 Labs (5/17): LDL 48, LFTs normal Labs (9/17): K 5, creatinine 1.21 Labs (11/17): HCT 41.4, digoxin 0.3, K 4.9, creatinine 1.27 Labs (5/18): K 5.2, creatinine 1.47 Labs (7/18): digoxin 0.3 Labs (9/18): LDL 82, HDL 49, K 4.5, creatinine 1.34 Labs (3/19): digoxin 0.4, K 4.5, creatinine 1.42 Labs (6/19): K 5.2, LDL 76 Labs (10/19): K 5, creatinine 1.57 Labs (11/19): creatinine 1.9 Labs (12/19): K 4.5, creatinine 1.66 Labs (8/20): K 4.8, creatinine 1.74, hgb 11.7  PMH: 1. Type II diabetes with peripheral neuropathy and nephropathy.  2. CKD stage 3 3. Hyperlipidemia 4. HTN: ACEI cough.  5. Nonischemic cardiomyopathy: This was diagnosed prior to 2008.  In 2008, patient had LHC with only mild nonobstructive coronary disease.  Echo in 1/14 showed EF 25-30% with normal RV.  Patient has not tolerated ACEI due to hyperkalemia and AKI. ICD was discussed (Dr Lovena Le) and decided against given NYHA class I symptoms.  Echo (7/15) with EF 25-30%, septal-lateral dyssynchrony, mild LV dilation. Echo (6/16) with EF 25-30%, mild LVH, septal-lateral dyssynchrony, normal RV size and systolic function. CPX (12/16) with peak VO2 14.5 (69% predicted), VE/VCO2 34, RER 1.19 => mild functional impairment from heart failure, restrictive lung function likely  due to body habitus.  - LHC/RHC (6/17): 30% mid LAD stenosis; mean RA 5, PA 33/10, PCWP mean 16, CI 1.95.  - Medtronic CRT-D placed 7/17.  - Echo (1/18): EF 40-45%, mild LVH.  - Echo (3/19): EF 40-45%, moderate LVH, normal RV size/function.  - Echo (5/20): EF 55-60%, normal RV size and systolic function.  6. Chronic LBBB 7. Obesity 8. Atrial flutter and atrial fibrillation: S/p atrial flutter ablation in 2/16 ([facility]).  9. Normal pressure hydrocephalus: s/p VP shunt.  10. OSA: Uses CPAP.   SH: Lives in [place],  married, prior smoker, no ETOH.  1 child.   FH: Grandmother with CHF.  Mother with CVA.   ROS: All systems reviewed and negative except as per HPI.    Current Outpatient Medications  Medication Sig Dispense Refill  . Ascorbic Acid (VITAMIN C) 1000 MG tablet Take 1 tablet by mouth 2 (two) times daily.     . carvedilol (COREG) 25 MG tablet Take 1 tablet (25 mg total) by mouth 2 (two) times daily with a meal. 180 tablet 3  . Cinnamon 500 MG TABS Take 1 tablet (500 mg total) by mouth daily.    . fenofibrate 54 MG tablet Take 1 tablet by mouth once daily 90 tablet 0  . insulin NPH-regular Human (70-30) 100 UNIT/ML injection 130 units with breakfast, and 50 units with supper, and syringes 2/day 100 mL 11  . loratadine (CLARITIN) 10 MG tablet Take 1 tablet by mouth 2 (two) times daily.     . Multiple Vitamin (MULTIVITAMIN) tablet Take 1 tablet by mouth daily.      Marland Kitchen omeprazole (PRILOSEC) 40 MG capsule Take 40 mg by mouth daily. OTC    . Rivaroxaban (XARELTO) 15 MG TABS tablet Take 1 tablet (15 mg total) by mouth daily with supper. 30 tablet 6  . sacubitril-valsartan (ENTRESTO) 24-26 MG Take 1 tablet by mouth 2 (two) times daily. 180 tablet 3  . simvastatin (ZOCOR) 40 MG tablet Take 1 tablet (40 mg total) by mouth at bedtime. 90 tablet 3  . tadalafil (CIALIS) 5 MG tablet Take 5 mg by mouth daily.  3  . tamsulosin (FLOMAX) 0.4 MG CAPS capsule Take 0.4 mg by mouth daily.    Marland Kitchen torsemide (DEMADEX) 10 MG tablet Alternate taking 20 mg (2 tablets) and 10 mg (1 tablet) every other day. 90 tablet 3   No current facility-administered medications for this encounter.     BP (!) 126/58   Pulse 94   Wt 122.9 kg (271 lb)   SpO2 96%   BMI 36.75 kg/m  General: NAD Neck: Thick JVP difficult, no thyromegaly or thyroid nodule.  Lungs: Clear to auscultation bilaterally with normal respiratory effort. CV: Nondisplaced PMI.  Heart regular S1/S2, no S3/S4, no murmur.  1+ edema to knees.  No carotid bruit.  Normal  pedal pulses.  Abdomen: Soft, nontender, no hepatosplenomegaly, no distention.  Skin: Intact without lesions or rashes.  Neurologic: Alert and oriented x 3.  Psych: Normal affect. Extremities: No clubbing or cyanosis.  HEENT: Normal.   Assessment/Plan: 1. Cardiomyopathy: Nonischemic.  Low EF x years. Echo 6/16 showed stable EF 25-30% with septal-lateral dyssynchrony.  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  Interestingly, it also appears that he has a long-standing LBBB.  A chronic LBBB itself can potentially cause a cardiomyopathy and may be the source of his low EF.   Coronary angiography in  6/17 with nonobstructive disease. Now s/p Medtronic CRT-D device placement.  Most recent echo in 5/20 showed EF normalized, 55-60%.  NYHA class III symptoms.  Exam difficult, JVP hard to assess and he does have peripheral edema, weight is up.  However, device interrogation is not suggestive of volume overload, neither is REDS clip.  I suspect his symptoms are more due to deconditioning.     - Continue Coreg 25 mg bid.  - Continue Entresto 24/26 bid, dose decreased in past due to lightheadedess.   - K and creatinine have been high, he has not been on spironolactone.  - Continue torsemide 20 daily alternating with 10 daily.  BMET today.     2. Hyperlipidemia: Patient is on simvastatin.   - Check lipids today.   3. CKD: Stage 3.   - BMET today.  4. Atrial flutter: s/p ablation, in NSR today.  5. Atrial fibrillation: Patient had afib noted as well as flutter.  Increase afib burden based on device interrogation (4.1% afib in last month or so).  He is in NSR today.  - Decrease Xarelto to 15 mg daily based on his GFR.  6. Normal pressure hydrocephalus: Has VP shunt.   7. OSA: Continue to use CPAP.   Followup in 4 months.  Loralie Champagne 10/16/2019

## 2019-10-17 ENCOUNTER — Encounter: Payer: Self-pay | Admitting: Adult Health

## 2019-10-17 ENCOUNTER — Ambulatory Visit (INDEPENDENT_AMBULATORY_CARE_PROVIDER_SITE_OTHER): Payer: Medicare Other | Admitting: Adult Health

## 2019-10-17 ENCOUNTER — Other Ambulatory Visit: Payer: Self-pay

## 2019-10-17 VITALS — BP 144/70 | HR 87 | Temp 97.4°F | Ht 71.0 in | Wt 273.8 lb

## 2019-10-17 DIAGNOSIS — G4733 Obstructive sleep apnea (adult) (pediatric): Secondary | ICD-10-CM

## 2019-10-17 DIAGNOSIS — R413 Other amnesia: Secondary | ICD-10-CM

## 2019-10-17 DIAGNOSIS — Z9989 Dependence on other enabling machines and devices: Secondary | ICD-10-CM | POA: Diagnosis not present

## 2019-10-17 NOTE — Patient Instructions (Addendum)
Your Plan:  Continue using CPAP nightly and greater than 4 hours each night Memory score is stable If your symptoms worsen or you develop new symptoms please let us know.    Thank you for coming to see [place] at [facility]. I hope we have been able to provide you high quality care today.  You may receive a patient satisfaction survey over the next few weeks. We would appreciate your feedback and comments so that we may continue to improve ourselves and the health of our patients.

## 2019-10-17 NOTE — Progress Notes (Addendum)
PATIENT: Justin Hill DOB: 1941/07/16  REASON FOR VISIT: follow up HISTORY FROM: patient  HISTORY OF PRESENT ILLNESS: Today 10/17/19:  [name] is a 78 year old male with a history of obstructive sleep apnea on CPAP.  His download indicates that he uses machine nightly for compliance of 100%.  He uses machine greater than 4 hours 26 days for compliance of 87%.  On average he uses machine 5 hours and 43 minutes.  His residual AHI is 4 on 8 cm of water with EPR of 3.  His leak in the 95th percentile is 11.3 L/min.  His wife is concerned about his memory.  He is able to complete all ADLs independently.  His wife manages all the finances.  He continues to operate a motor vehicle but only when his wife is with him.  She reports that night he sometimes wakes up and feels that other people around the house.  She denies any hallucinations during the day.  The patient was on Namzaric in the past but was unable to tolerate it.  Patient returns today for an evaluation.  HISTORY 10/14/18:  [name] is a 78 year old male with a history of obstructive sleep apnea on CPAP.  He returns today for follow-up.  His CPAP download indicates that he uses machine 29 out of 30 days for compliance of 97%.  He uses machine greater than 4 hours 22 out of 30 days for compliance of 73%.  On average he uses his machine 4 hours and 52 minutes.  His residual AHI is 2.6 on 8 cm of water with EPR of 3.  He reports that the CPAP continues to work well for him.  He states that he tends to still does during the day he also does not sleep well at night.  He states that he is up and down most of the night.  There is some nights that he does not put the CPAP back on.  He returns today for evaluation.  REVIEW OF SYSTEMS: Out of a complete 14 system review of symptoms, the patient complains only of the following symptoms, and all other reviewed systems are negative.  See HPI  ALLERGIES: Allergies  Allergen Reactions   . Penicillins Anaphylaxis and Other (See Comments)    SYNCOPE PATIENT HAS HAD A PCN REACTION WITH IMMEDIATE RASH, FACIAL/TONGUE/THROAT SWELLING, SOB, OR LIGHTHEADEDNESS WITH HYPOTENSION:  #  #  #  YES  #  #  #   Has patient had a PCN reaction causing severe rash involving mucus membranes or skin necrosis: no PATIENT HAS HAD A PCN REACTION THAT REQUIRED HOSPITALIZATION:  #  #  #  YES  #  #  #  Has patient had a PCN reaction occurring within the last 10 years: no    . Aspirin Other (See Comments)    Burps, burning, stomach pains, etc  . Lisinopril Cough    Severe coughing    HOME MEDICATIONS: Outpatient Medications Prior to Visit  Medication Sig Dispense Refill  . Ascorbic Acid (VITAMIN C) 1000 MG tablet Take 1 tablet by mouth 2 (two) times daily.     . carvedilol (COREG) 25 MG tablet Take 1 tablet (25 mg total) by mouth 2 (two) times daily with a meal. 180 tablet 3  . Cinnamon 500 MG TABS Take 1 tablet (500 mg total) by mouth daily.    . fenofibrate 54 MG tablet Take 1 tablet by mouth once daily 90 tablet 0  . insulin NPH-regular  Human (70-30) 100 UNIT/ML injection 130 units with breakfast, and 50 units with supper, and syringes 2/day 100 mL 11  . loratadine (CLARITIN) 10 MG tablet Take 1 tablet by mouth 2 (two) times daily.     . Multiple Vitamin (MULTIVITAMIN) tablet Take 1 tablet by mouth daily.      Marland Kitchen omeprazole (PRILOSEC) 40 MG capsule Take 40 mg by mouth daily. OTC    . Rivaroxaban (XARELTO) 15 MG TABS tablet Take 1 tablet (15 mg total) by mouth daily with supper. 30 tablet 6  . sacubitril-valsartan (ENTRESTO) 24-26 MG Take 1 tablet by mouth 2 (two) times daily. 180 tablet 3  . simvastatin (ZOCOR) 40 MG tablet Take 1 tablet (40 mg total) by mouth at bedtime. 90 tablet 3  . tadalafil (CIALIS) 5 MG tablet Take 5 mg by mouth daily.  3  . tamsulosin (FLOMAX) 0.4 MG CAPS capsule Take 0.4 mg by mouth daily.    Marland Kitchen torsemide (DEMADEX) 10 MG tablet Alternate taking 20 mg (2 tablets) and 10  mg (1 tablet) every other day. 90 tablet 3   No facility-administered medications prior to visit.     PAST MEDICAL HISTORY: Past Medical History:  Diagnosis Date  . Altered mental status   . Cardiomyopathy   . CHF (congestive heart failure) ([place])   . Chronic kidney disease (CKD), stage III (moderate)   . Communicating hydrocephalus ([place])   . Diabetes mellitus    type 2  . Diverticulosis   . History of kidney stones   . Hypercholesterolemia   . Hyperglycemia   . Hypertension   . Paroxysmal atrial fibrillation ([facility])   . Personal history of kidney stones   . Pneumonia   . Sleep apnea    uses cpap    PAST SURGICAL HISTORY: Past Surgical History:  Procedure Laterality Date  . ATRIAL FLUTTER ABLATION N/A 01/19/2015   Procedure: ATRIAL FLUTTER ABLATION;  Surgeon: [name], MD;  Location: [facility] CATH LAB;  Service: Cardiovascular;  Laterality: N/A;  . CARDIAC CATHETERIZATION N/A 05/02/2016   Procedure: Right/Left Heart Cath and Coronary Angiography;  Surgeon: Larey Dresser, MD;  Location: [facility] CV LAB;  Service: Cardiovascular;  Laterality: N/A;  . COLONOSCOPY N/A 12/29/2013   Procedure: COLONOSCOPY;  Surgeon: Lafayette Dragon, MD;  Location: [facility] ENDOSCOPY;  Service: Endoscopy;  Laterality: N/A;  . EP IMPLANTABLE DEVICE N/A 06/05/2016   MDT Hillery Aldo XT CRTD implanted by Dr Rayann Heman   . EYE SURGERY     cataract surgery (not sure which eye)  . [place]   left. Muscle reattachment  . TOE SURGERY Right 12/2012   2nd toe  . TONSILLECTOMY    . VENTRICULOPERITONEAL SHUNT Right 11/20/2017   Procedure: SHUNT PLACEMENT RIGHT;  Surgeon: Newman Pies, MD;  Location: [facility];  Service: Neurosurgery;  Laterality: Right;  SHUNT PLACEMENT RIGHT    FAMILY HISTORY: Family History  Problem Relation Age of Onset  . Heart failure Maternal Grandmother   . Diabetes type II Sister 36  . Cancer Sister   . Hyperlipidemia Other        Parent  . Stroke Other        Parent  .  Hypertension Other        Parent  . Diabetes Other        Parent  . Colon cancer Neg Hx   . Heart attack Neg Hx     SOCIAL HISTORY: Social History   Socioeconomic History  .  Marital status: Married    Spouse name: Rosemarie Beath  . Number of children: 1  . Years of education: 71  . Highest education level: Not on file  Occupational History  . Occupation: retired  Scientific laboratory technician  . Financial resource strain: Not on file  . Food insecurity    Worry: Not on file    Inability: Not on file  . Transportation needs    Medical: Not on file    Non-medical: Not on file  Tobacco Use  . Smoking status: Former Smoker    Types: Cigarettes  . Smokeless tobacco: Never Used  Substance and Sexual Activity  . Alcohol use: No  . Drug use: No  . Sexual activity: Not on file  Lifestyle  . Physical activity    Days per week: Not on file    Minutes per session: Not on file  . Stress: Not on file  Relationships  . Social Herbalist on phone: Not on file    Gets together: Not on file    Attends religious service: Not on file    Active member of club or organization: Not on file    Attends meetings of clubs or organizations: Not on file    Relationship status: Not on file  . Intimate partner violence    Fear of current or ex partner: Not on file    Emotionally abused: Not on file    Physically abused: Not on file    Forced sexual activity: Not on file  Other Topics Concern  . Not on file  Social History Narrative   Regular exercise-no   Rare caffeine use       PHYSICAL EXAM  Vitals:   10/17/19 1249  BP: (!) 144/70  Pulse: 87  Temp: (!) 97.4 F (36.3 C)  Weight: 273 lb 12.8 oz (124.2 kg)  Height: 5\' 11"  (1.803 m)   Body mass index is 38.19 kg/m. MMSE - Mini Mental State Exam 10/17/2019 10/05/2017 03/25/2017  Orientation to time 3 4 2   Orientation to Place 3 4 3   Registration 3 3 3   Attention/ Calculation 1 1 4   Recall 3 1 2   Language- name 2 objects 2 2 2    Language- repeat 1 0 1  Language- follow 3 step command 3 3 2   Language- read & follow direction 1 1 1   Write a sentence 1 1 1   Copy design 0 0 1  Copy design-comments named 17 animals - -  Total score 21 20 22     Generalized: Well developed, in no acute distress  Chest: Lungs clear to auscultation bilaterally  Neurological examination  Mentation: Alert oriented to time, place, history taking. Follows all commands speech and language fluent Cranial nerve II-XII: Extraocular movements were full, visual field were full on confrontational test Head turning and shoulder shrug  were normal and symmetric. Motor: The motor testing reveals 5 over 5 strength of all 4 extremities. Good symmetric motor tone is noted throughout.  Sensory: Sensory testing is intact to soft touch on all 4 extremities. No evidence of extinction is noted.  Gait and station: Gait is normal.    DIAGNOSTIC DATA (LABS, IMAGING, TESTING) - I reviewed patient records, labs, notes, testing and imaging myself where available.  Lab Results  Component Value Date   WBC 7.0 01/13/2019   HGB 12.4 (L) 01/13/2019   HCT 38.4 (L) 01/13/2019   MCV 87.1 01/13/2019   PLT 171 01/13/2019      Component  Value Date/Time   NA 138 10/14/2019 0943   NA 139 04/27/2019 1016   K 5.0 10/14/2019 0943   CL 99 10/14/2019 0943   CO2 30 10/14/2019 0943   GLUCOSE 349 (H) 10/14/2019 0943   BUN 20 10/14/2019 0943   BUN 21 04/27/2019 1016   CREATININE 1.85 (H) 10/14/2019 0943   CREATININE 1.52 (H) 07/23/2016 0930   CALCIUM 9.0 10/14/2019 0943   PROT 6.6 08/03/2017 0959   ALBUMIN 4.3 08/03/2017 0959   AST 19 08/03/2017 0959   ALT 20 08/03/2017 0959   ALKPHOS 47 08/03/2017 0959   BILITOT 0.3 08/03/2017 0959   GFRNONAA 35 (L) 10/14/2019 0943   GFRAA 40 (L) 10/14/2019 0943   Lab Results  Component Value Date   CHOL 148 10/14/2019   HDL 46 10/14/2019   LDLCALC 71 10/14/2019   TRIG 155 (H) 10/14/2019   CHOLHDL 3.2 10/14/2019   Lab  Results  Component Value Date   HGBA1C 7.9 (A) 09/27/2019   Lab Results  Component Value Date   VITAMINB12 279 02/05/2017   Lab Results  Component Value Date   TSH 1.580 02/05/2017      ASSESSMENT AND PLAN 78 y.o. year old male  has a past medical history of Altered mental status, Cardiomyopathy, CHF (congestive heart failure) ([place]), Chronic kidney disease (CKD), stage III (moderate), Communicating hydrocephalus ([place]), Diabetes mellitus, Diverticulosis, History of kidney stones, Hypercholesterolemia, Hyperglycemia, Hypertension, Paroxysmal atrial fibrillation ([place]), Personal history of kidney stones, Pneumonia, and Sleep apnea. here with :  1. Obstructive sleep apnea on CPAP 2.   Memory disturbance  The patient's CPAP download shows excellent compliance and good treatment of his apnea.  He is encouraged to continue using CPAP nightly and greater than 4 hours each night.  Memory score has remained stable.  We will continue to monitor.  He is advised that if his symptoms worsen or he develops new symptoms he should let us know.  He will follow-up in 1 year or sooner if needed   Ward Givens, MSN, NP-C 10/17/2019, 1:12 PM [facility] [address] [place], [place] [address] [phone]  I reviewed the above note and documentation by the Nurse Practitioner and agree with the history, exam, assessment and plan as outlined above. I was available for consultation. Star Age, MD, PhD [facility])

## 2019-10-25 ENCOUNTER — Other Ambulatory Visit: Payer: Self-pay | Admitting: Internal Medicine

## 2019-10-25 DIAGNOSIS — N401 Enlarged prostate with lower urinary tract symptoms: Secondary | ICD-10-CM | POA: Diagnosis not present

## 2019-10-25 DIAGNOSIS — R1032 Left lower quadrant pain: Secondary | ICD-10-CM | POA: Diagnosis not present

## 2019-10-25 DIAGNOSIS — R3915 Urgency of urination: Secondary | ICD-10-CM | POA: Diagnosis not present

## 2019-10-25 DIAGNOSIS — N1832 Chronic kidney disease, stage 3b: Secondary | ICD-10-CM | POA: Diagnosis not present

## 2019-10-25 DIAGNOSIS — R1031 Right lower quadrant pain: Secondary | ICD-10-CM | POA: Diagnosis not present

## 2019-10-25 DIAGNOSIS — R1900 Intra-abdominal and pelvic swelling, mass and lump, unspecified site: Secondary | ICD-10-CM | POA: Diagnosis not present

## 2019-10-25 DIAGNOSIS — I129 Hypertensive chronic kidney disease with stage 1 through stage 4 chronic kidney disease, or unspecified chronic kidney disease: Secondary | ICD-10-CM | POA: Diagnosis not present

## 2019-10-25 DIAGNOSIS — R81 Glycosuria: Secondary | ICD-10-CM | POA: Diagnosis not present

## 2019-10-25 DIAGNOSIS — N3943 Post-void dribbling: Secondary | ICD-10-CM | POA: Diagnosis not present

## 2019-10-28 DIAGNOSIS — R1900 Intra-abdominal and pelvic swelling, mass and lump, unspecified site: Secondary | ICD-10-CM | POA: Diagnosis not present

## 2019-10-28 DIAGNOSIS — K8020 Calculus of gallbladder without cholecystitis without obstruction: Secondary | ICD-10-CM | POA: Diagnosis not present

## 2019-10-28 DIAGNOSIS — I129 Hypertensive chronic kidney disease with stage 1 through stage 4 chronic kidney disease, or unspecified chronic kidney disease: Secondary | ICD-10-CM | POA: Diagnosis not present

## 2019-10-28 DIAGNOSIS — R1032 Left lower quadrant pain: Secondary | ICD-10-CM | POA: Diagnosis not present

## 2019-10-28 DIAGNOSIS — K5730 Diverticulosis of large intestine without perforation or abscess without bleeding: Secondary | ICD-10-CM | POA: Diagnosis not present

## 2019-10-28 DIAGNOSIS — R1031 Right lower quadrant pain: Secondary | ICD-10-CM | POA: Diagnosis not present

## 2019-10-29 ENCOUNTER — Other Ambulatory Visit: Payer: Self-pay | Admitting: Internal Medicine

## 2019-11-11 ENCOUNTER — Encounter: Payer: Self-pay | Admitting: Endocrinology

## 2019-11-22 ENCOUNTER — Ambulatory Visit (INDEPENDENT_AMBULATORY_CARE_PROVIDER_SITE_OTHER): Payer: Medicare Other | Admitting: *Deleted

## 2019-11-22 DIAGNOSIS — I428 Other cardiomyopathies: Secondary | ICD-10-CM | POA: Diagnosis not present

## 2019-11-23 ENCOUNTER — Ambulatory Visit (INDEPENDENT_AMBULATORY_CARE_PROVIDER_SITE_OTHER): Payer: Medicare Other

## 2019-11-23 DIAGNOSIS — Z95810 Presence of automatic (implantable) cardiac defibrillator: Secondary | ICD-10-CM | POA: Diagnosis not present

## 2019-11-23 DIAGNOSIS — I5022 Chronic systolic (congestive) heart failure: Secondary | ICD-10-CM | POA: Diagnosis not present

## 2019-11-23 LAB — CUP PACEART REMOTE DEVICE CHECK
Battery Remaining Longevity: 17 mo
Battery Voltage: 2.92 V
Brady Statistic AP VP Percent: 4.11 %
Brady Statistic AP VS Percent: 0.01 %
Brady Statistic AS VP Percent: 95.8 %
Brady Statistic AS VS Percent: 0.08 %
Brady Statistic RA Percent Paced: 4.08 %
Brady Statistic RV Percent Paced: 99.91 %
Date Time Interrogation Session: 20201229001708
HighPow Impedance: 79 Ohm
Implantable Lead Implant Date: 20170713
Implantable Lead Implant Date: 20170713
Implantable Lead Implant Date: 20170713
Implantable Lead Location: 753858
Implantable Lead Location: 753859
Implantable Lead Location: 753860
Implantable Lead Model: 4598
Implantable Lead Model: 5076
Implantable Pulse Generator Implant Date: 20170713
Lead Channel Impedance Value: 399 Ohm
Lead Channel Impedance Value: 399 Ohm
Lead Channel Impedance Value: 399 Ohm
Lead Channel Impedance Value: 418 Ohm
Lead Channel Impedance Value: 418 Ohm
Lead Channel Impedance Value: 475 Ohm
Lead Channel Impedance Value: 513 Ohm
Lead Channel Impedance Value: 513 Ohm
Lead Channel Impedance Value: 646 Ohm
Lead Channel Impedance Value: 665 Ohm
Lead Channel Impedance Value: 722 Ohm
Lead Channel Impedance Value: 760 Ohm
Lead Channel Impedance Value: 874 Ohm
Lead Channel Pacing Threshold Amplitude: 0.375 V
Lead Channel Pacing Threshold Amplitude: 0.75 V
Lead Channel Pacing Threshold Amplitude: 3 V
Lead Channel Pacing Threshold Pulse Width: 0.4 ms
Lead Channel Pacing Threshold Pulse Width: 0.4 ms
Lead Channel Pacing Threshold Pulse Width: 0.8 ms
Lead Channel Sensing Intrinsic Amplitude: 1.625 mV
Lead Channel Sensing Intrinsic Amplitude: 1.625 mV
Lead Channel Sensing Intrinsic Amplitude: 6.375 mV
Lead Channel Sensing Intrinsic Amplitude: 6.375 mV
Lead Channel Setting Pacing Amplitude: 2 V
Lead Channel Setting Pacing Amplitude: 2.5 V
Lead Channel Setting Pacing Amplitude: 4.25 V
Lead Channel Setting Pacing Pulse Width: 0.4 ms
Lead Channel Setting Pacing Pulse Width: 0.8 ms
Lead Channel Setting Sensing Sensitivity: 0.3 mV

## 2019-11-23 NOTE — Progress Notes (Signed)
EPIC Encounter for ICM Monitoring  Patient Name: Justin Hill is a 78 y.o. male Date: 11/23/2019 Primary Care Physican: Serita Grammes, MD Primary Cardiologist:McLean Electrophysiologist: Allred Bi-V Pacing: 99.9% 11/18/2020Weight:270 lbs  Clinical Status (12-Nov-2019 to 22-Nov-2019)  Treated VT/VF     0 episodes      AT/AF                   3 episodes     Time in AT/AF      0.3 hr/day (1.3%)   Spoke with wife and patient has some shortness of breath.  Patient takes Albuterol to help with breathing.   He does not adhere to low salt diet and eating foods such as roasted peanuts and sweets.  Optivol thoracic impedance trending slightly below baseline normal.  Prescribed:Torsemide10mg  Alternate taking 20 mg (2 tablets) and 10 mg (1 tablet) every other day.  Labs: 10/14/2019 Creatinine 1.85, BUN 20, Potassium 5.0, Sodium 138, GFR 35-40 07/06/2019 Creatinine 1.74, BUN 16, Potassium 4.8, Sodium 139, GFR 37-43 04/27/2019 Creatinine1.96, BUN21, Potassium4.7, Sodium139, GFR32-37 04/12/2019 Creatinine1.89, BUN22, Potassium4.8, Sodium139, KQA06-01 5/11/20020 Creatinine 2.04, BUN 23, Potassium 5.3, Sodium 140, GFR 31-36 03/14/2019 Creatinine 1.73, BUN 17, Potassium 5.0, Sodium 139, GFR 38-43 02/07/2019 Creatinine1.62, BUN12, Potassium4.9, Sodium140, GFR41-47 01/24/2019 Creatinine2.90, BUN25, Potassium3.9, Sodium140, GFR20-23  01/13/2019 Creatinine2.30, BUN21, Potassium4.5, Sodium136, [id number] A complete set of results can be found in Results Review.  Recommendations: Recommendation to adhere to low salt diet and avoid snack foods high in salt.  Encouraged to call if experiencing any fluid symptoms.   Follow-up plan: ICM clinic phone appointment on 11/29/2019.   91 day device clinic remote transmission 02/21/2020.   Office appt with Dr Aundra Dubin 01/17/2020.  Copy of ICM check sent to Dr. Rayann Heman and Dr Aundra Dubin for review.  3 month ICM  trend: 11/22/2019    1 Year ICM trend:       Rosalene Billings, RN 11/23/2019 3:27 PM

## 2019-11-27 NOTE — Progress Notes (Signed)
Increase torsemide to 20 mg daily x 4 days then back to prior alternating regimen.

## 2019-11-28 ENCOUNTER — Other Ambulatory Visit (HOSPITAL_COMMUNITY): Payer: Self-pay | Admitting: Internal Medicine

## 2019-11-28 NOTE — Progress Notes (Signed)
Attempted call to wife to provide Dr Claris Gladden recommendations and left message for return call.

## 2019-11-29 ENCOUNTER — Ambulatory Visit (INDEPENDENT_AMBULATORY_CARE_PROVIDER_SITE_OTHER): Payer: Medicare Other

## 2019-11-29 DIAGNOSIS — Z95810 Presence of automatic (implantable) cardiac defibrillator: Secondary | ICD-10-CM

## 2019-11-29 DIAGNOSIS — I5022 Chronic systolic (congestive) heart failure: Secondary | ICD-10-CM

## 2019-11-29 NOTE — Progress Notes (Signed)
EPIC Encounter for ICM Monitoring  Patient Name: KOICHI PLATTE is a 79 y.o. male Date: 11/29/2019 Primary Care Physican: Serita Grammes, MD Primary Cardiologist:McLean Electrophysiologist: Allred Bi-V Pacing: 99.9% 11/18/2020Weight:270 lbs   Spoke with wife and patient has some shortness of breath.   Optivol thoracic impedance continues to suggest possible fluid accumulation.   Prescribed:Torsemide10mg  Alternate taking 20 mg (2 tablets) and 10 mg (1 tablet) every other day.  Labs: 10/14/2019 Creatinine 1.85, BUN 20, Potassium 5.0, Sodium 138, GFR 35-40 07/06/2019 Creatinine 1.74, BUN 16, Potassium 4.8, Sodium 139, GFR 37-43 04/27/2019 Creatinine1.96, BUN21, Potassium4.7, Sodium139, GFR32-37 04/12/2019 Creatinine1.89, BUN22, Potassium4.8, Sodium139, WVT91-50 5/11/20020 Creatinine 2.04, BUN 23, Potassium 5.3, Sodium 140, GFR 31-36 03/14/2019 Creatinine 1.73, BUN 17, Potassium 5.0, Sodium 139, GFR 38-43 02/07/2019 Creatinine1.62, BUN12, Potassium4.9, Sodium140, GFR41-47 01/24/2019 Creatinine2.90, BUN25, Potassium3.9, Sodium140, GFR20-23  01/13/2019 Creatinine2.30, BUN21, Potassium4.5, Sodium136, [id number] A complete set of results can be found in Results Review.  Recommendations:  Dr Oleh Genin recommendations received on 1/4 were given to wife today to increase Torsemide to 20 mg daily x 4 days and then return to alternating regimen.     Follow-up plan: ICM clinic phone appointment on 12/05/2019 (manual send) to recheck fluid levels. 91 day device clinic remote transmission 02/21/2020.Office appt with Dr Aundra Dubin 01/17/2020.  Copy of ICM check sent to Dr.Allred.  3 month ICM trend: 11/29/2019    1 Year ICM trend:       Rosalene Billings, RN 11/29/2019 10:15 AM

## 2019-11-29 NOTE — Progress Notes (Signed)
Spoke with wife. Advised of Dr Claris Gladden recommendation to increase torsemide to 20 mg daily x 4 days then back to prior alternating regimen.  She verbalized understanding.

## 2019-12-05 ENCOUNTER — Ambulatory Visit (INDEPENDENT_AMBULATORY_CARE_PROVIDER_SITE_OTHER): Payer: Medicare Other

## 2019-12-05 DIAGNOSIS — I5022 Chronic systolic (congestive) heart failure: Secondary | ICD-10-CM

## 2019-12-05 DIAGNOSIS — Z95810 Presence of automatic (implantable) cardiac defibrillator: Secondary | ICD-10-CM

## 2019-12-05 MED ORDER — TORSEMIDE 10 MG PO TABS: 20.0000 mg | ORAL_TABLET | Freq: Every day | ORAL | 3 refills | Status: DC

## 2019-12-05 NOTE — Progress Notes (Signed)
EPIC Encounter for ICM Monitoring  Patient Name: Justin Hill is a 79 y.o. male Date: 12/05/2019 Primary Care Physican: Serita Grammes, MD Primary Cardiologist:McLean Electrophysiologist: Allred Bi-V Pacing: 99.9% 12/05/2019 Weight: 273-275 lbs   Spoke with wife and patientcontinues to have shortness of breath, dry cough and swelling of the feet even after increasing Torsemide to 20 mg daily x 4 days.  He has returned to alternating prescribed Torsemide dosage on 1/9.  Optivol thoracic impedance continues to suggest possible fluid accumulation even after Torsemide 20 mg x 4 days.   Prescribed:Torsemide10mg  Alternate taking 20 mg (2 tablets) and 10 mg (1 tablet) every other day.  Labs: 10/14/2019 Creatinine 1.85, BUN 20, Potassium 5.0, Sodium 138, GFR 35-40 07/06/2019 Creatinine 1.74, BUN 16, Potassium 4.8, Sodium 139, GFR 37-43 04/27/2019 Creatinine1.96, BUN21, Potassium4.7, Sodium139, GFR32-37 04/12/2019 Creatinine1.89, BUN22, Potassium4.8, Sodium139, FBP79-43 5/11/20020 Creatinine 2.04, BUN 23, Potassium 5.3, Sodium 140, GFR 31-36 03/14/2019 Creatinine 1.73, BUN 17, Potassium 5.0, Sodium 139, GFR 38-43 02/07/2019 Creatinine1.62, BUN12, Potassium4.9, Sodium140, GFR41-47 01/24/2019 Creatinine2.90, BUN25, Potassium3.9, Sodium140, GFR20-23  01/13/2019 Creatinine2.30, BUN21, Potassium4.5, Sodium136, EXM14-70 A complete set of results can be found in Results Review.  Recommendations: Copy sent to Dr Aundra Dubin for review and recommendations.     Follow-up plan: ICM clinic phone appointment on1/15/2021 to recheck fluid levels. 91 day device clinic remote transmission3/30/2021.Officeappt with Dr McLean2/23/2021.8  Copy of ICM check sent to Dr.Allred and Dr Aundra Dubin.  3 month ICM trend: 12/05/2019    1 Year ICM trend:       Rosalene Billings, RN 12/05/2019 1:09 PM

## 2019-12-05 NOTE — Progress Notes (Signed)
Take torsemide 40 mg for 1 day, then continue at 20 mg daily after that.  BMET later this week.

## 2019-12-05 NOTE — Progress Notes (Signed)
Spoke with wife.  Advised Dr Aundra Dubin recommended for patient to take torsemide 40 mg for 1 day, then continue at 20 mg daily after that.  BMET later this week.  She repeated instructions back correctly. Patient will have labs drawn at [facility] in [place] on [address].  Confirmed fax number is [phone].

## 2019-12-09 ENCOUNTER — Other Ambulatory Visit: Payer: Self-pay | Admitting: Cardiology

## 2019-12-09 DIAGNOSIS — I5022 Chronic systolic (congestive) heart failure: Secondary | ICD-10-CM | POA: Diagnosis not present

## 2019-12-09 LAB — BASIC METABOLIC PANEL
BUN/Creatinine Ratio: 9 — ABNORMAL LOW (ref 10–24)
BUN: 18 mg/dL (ref 8–27)
CO2: 31 mmol/L — ABNORMAL HIGH (ref 20–29)
Calcium: 9 mg/dL (ref 8.6–10.2)
Chloride: 96 mmol/L (ref 96–106)
Creatinine, Ser: 1.95 mg/dL — ABNORMAL HIGH (ref 0.76–1.27)
GFR calc Af Amer: 38 mL/min/{1.73_m2} — ABNORMAL LOW (ref >59)
GFR calc non Af Amer: 32 mL/min/{1.73_m2} — ABNORMAL LOW (ref >59)
Glucose: 175 mg/dL — ABNORMAL HIGH (ref 65–99)
Potassium: 4.3 mmol/L (ref 3.5–5.2)
Sodium: 136 mmol/L (ref 134–144)

## 2019-12-09 LAB — SPECIMEN STATUS REPORT

## 2019-12-13 DIAGNOSIS — N183 Chronic kidney disease, stage 3 unspecified: Secondary | ICD-10-CM | POA: Diagnosis not present

## 2019-12-13 DIAGNOSIS — D631 Anemia in chronic kidney disease: Secondary | ICD-10-CM | POA: Diagnosis not present

## 2019-12-14 ENCOUNTER — Other Ambulatory Visit: Payer: Self-pay

## 2019-12-16 ENCOUNTER — Other Ambulatory Visit (HOSPITAL_COMMUNITY): Payer: Self-pay | Admitting: Cardiology

## 2019-12-19 NOTE — Progress Notes (Signed)
No ICM remote transmission received for 12/09/2019 and next ICM transmission scheduled for 01/02/2020.

## 2019-12-20 ENCOUNTER — Telehealth: Payer: Self-pay

## 2019-12-20 DIAGNOSIS — R413 Other amnesia: Secondary | ICD-10-CM | POA: Diagnosis not present

## 2019-12-20 DIAGNOSIS — D509 Iron deficiency anemia, unspecified: Secondary | ICD-10-CM | POA: Diagnosis not present

## 2019-12-20 DIAGNOSIS — N1832 Chronic kidney disease, stage 3b: Secondary | ICD-10-CM | POA: Diagnosis not present

## 2019-12-20 DIAGNOSIS — E1122 Type 2 diabetes mellitus with diabetic chronic kidney disease: Secondary | ICD-10-CM | POA: Diagnosis not present

## 2019-12-20 DIAGNOSIS — N200 Calculus of kidney: Secondary | ICD-10-CM | POA: Diagnosis not present

## 2019-12-20 DIAGNOSIS — I5022 Chronic systolic (congestive) heart failure: Secondary | ICD-10-CM | POA: Diagnosis not present

## 2019-12-20 DIAGNOSIS — I129 Hypertensive chronic kidney disease with stage 1 through stage 4 chronic kidney disease, or unspecified chronic kidney disease: Secondary | ICD-10-CM | POA: Diagnosis not present

## 2019-12-20 DIAGNOSIS — N401 Enlarged prostate with lower urinary tract symptoms: Secondary | ICD-10-CM | POA: Diagnosis not present

## 2019-12-20 NOTE — Telephone Encounter (Signed)
Returned call to wife as requested by voice mail message.  She provided update from the visit with the nephrologist, Dr Royce Macadamia today.  Patient is having SOB, swelling of feet and weight gain of ~15 lbs in the last couple of weeks (weight today 287 lbs).    Dr Royce Macadamia ordered the following: 1. Torsemide 40 mg daily x 3 days   2. One time dose of Metolazone 5 mg for tomorrow 30 minutes prior to Torsemide 3. Iron infusions x 2 due to anemia  She will call Dr Luis Abed office on Friday to provide report on patients symptoms.  Follow up nephrology visit in 2 weeks.    Advised to send remote transmission 1/29 after taking Metolazone and before calling Dr Luis Abed office so I can provide update on fluid levels.  Suggested she provide the report results to Dr Luis Abed office Friday morning.  Advised if condition worsens to use local ER if needed.    Copy of note sent to Dr Aundra Dubin for [facility] on Dr Rubye Beach recommendations and patient's symptoms.  Patient has OV with Dr Aundra Dubin on 01/17/2020.

## 2019-12-23 ENCOUNTER — Ambulatory Visit (INDEPENDENT_AMBULATORY_CARE_PROVIDER_SITE_OTHER): Payer: Medicare Other

## 2019-12-23 DIAGNOSIS — I5022 Chronic systolic (congestive) heart failure: Secondary | ICD-10-CM

## 2019-12-23 DIAGNOSIS — Z95810 Presence of automatic (implantable) cardiac defibrillator: Secondary | ICD-10-CM

## 2019-12-23 NOTE — Progress Notes (Signed)
EPIC Encounter for ICM Monitoring  Patient Name: Justin Hill is a 79 y.o. male Date: 12/23/2019 Primary Care Physican: Serita Grammes, MD Primary Cardiologist:McLean Electrophysiologist: Allred Bi-V Pacing: 99.1% 12/05/2019 Weight: 278 lbs  Since 10-Dec-2019  Time in AT/AF  1.6 hr/day (6.5%)  Longest AT/AF  11 hours   ?    AT/AF Daily Burden > Threshold ?    TriageHFT (HF Risk) Alert: High (Ongoing) ?    Possible Fluid Accumulation ?    Low Patient Activity ?    11 V. Sensing Episodes ?    21 hours in AT/AF Since Last Session   Spoke with wife and patientcontinues to have shortness of breath but thinks it may be related to anemia. Swelling of feet have improved.  He will be receiving several iron infusions due to anemia.  Optivol thoracic impedanceis trending close to baseline after taking extra Torsemide as recommended by nephrologist, Dr Royce Macadamia.   Prescribed:Torsemide10mg  alternate taking 20 mg (2 tablets) by mouth daily.  Labs: 10/14/2019 Creatinine 1.85, BUN 20, Potassium 5.0, Sodium 138, GFR 35-40 07/06/2019 Creatinine 1.74, BUN 16, Potassium 4.8, Sodium 139, GFR 37-43 04/27/2019 Creatinine1.96, BUN21, Potassium4.7, Sodium139, GFR32-37 04/12/2019 Creatinine1.89, BUN22, Potassium4.8, Sodium139, FGB02-11 5/11/20020 Creatinine 2.04, BUN 23, Potassium 5.3, Sodium 140, GFR 31-36 03/14/2019 Creatinine 1.73, BUN 17, Potassium 5.0, Sodium 139, GFR 38-43 02/07/2019 Creatinine1.62, BUN12, Potassium4.9, Sodium140, GFR41-47 01/24/2019 Creatinine2.90, BUN25, Potassium3.9, Sodium140, GFR20-23  01/13/2019 Creatinine2.30, BUN21, Potassium4.5, Sodium136, [id number] A complete set of results can be found in Results Review.  Recommendations: Wife will discuss symptoms and remote transmission with Dr Royce Macadamia today and follow her recommendations.  Advised if patient needs earlier appointment with Dr Aundra Dubin to call his office.    Follow-up plan: ICM clinic phone appointment on2/06/2020. 91 day device clinic remote transmission3/30/2021.Officeappt with Dr McLean2/23/2021.8  Copy of ICM check sent to Dr.Allred.  3 month ICM trend: 12/23/2019    1 Year ICM trend:       Rosalene Billings, RN 12/23/2019 12:48 PM

## 2019-12-28 DIAGNOSIS — D631 Anemia in chronic kidney disease: Secondary | ICD-10-CM | POA: Diagnosis not present

## 2019-12-28 DIAGNOSIS — N189 Chronic kidney disease, unspecified: Secondary | ICD-10-CM | POA: Diagnosis not present

## 2020-01-02 ENCOUNTER — Ambulatory Visit (INDEPENDENT_AMBULATORY_CARE_PROVIDER_SITE_OTHER): Payer: Medicare Other

## 2020-01-02 DIAGNOSIS — Z95810 Presence of automatic (implantable) cardiac defibrillator: Secondary | ICD-10-CM

## 2020-01-02 DIAGNOSIS — I5022 Chronic systolic (congestive) heart failure: Secondary | ICD-10-CM | POA: Diagnosis not present

## 2020-01-03 ENCOUNTER — Telehealth: Payer: Self-pay

## 2020-01-03 ENCOUNTER — Ambulatory Visit: Payer: Medicare Other | Admitting: Endocrinology

## 2020-01-03 NOTE — Progress Notes (Signed)
EPIC Encounter for ICM Monitoring  Patient Name: Justin Hill is a 79 y.o. male Date: 01/03/2020 Primary Care Physican: Serita Grammes, MD Primary Cardiologist:McLean Electrophysiologist: Allred Bi-V Pacing: 98.9% 12/05/2019 Weight: 278 lbs  Clinical Status (23-Dec-2019 to 02-Jan-2020)  AT/AF              3 episodes    Time in AT/AF  0.3 hr/day (1.2%)    Attempted call to wife and unable to reach.  Left message to return call. Transmission reviewed.   Optivol thoracic impedancesuggesting possible fluid accumulation since December 2020 with exception of few days at baseline normal.   Prescribed:Torsemide10mg  take 2 tablets (20 mg total) by mouth daily.  Labs: 12/09/2019 Creatinine 1.95, BUN 18, Potassium 4.3, Sodium 136, GFR 32-38 10/14/2019 Creatinine 1.85, BUN 20, Potassium 5.0, Sodium 138, GFR 35-40 07/06/2019 Creatinine 1.74, BUN 16, Potassium 4.8, Sodium 139, GFR 37-43 04/27/2019 Creatinine1.96, BUN21, Potassium4.7, Sodium139, GFR32-37 04/12/2019 Creatinine1.89, BUN22, Potassium4.8, Sodium139, VEH20-94 5/11/20020 Creatinine 2.04, BUN 23, Potassium 5.3, Sodium 140, GFR 31-36 03/14/2019 Creatinine 1.73, BUN 17, Potassium 5.0, Sodium 139, GFR 38-43 02/07/2019 Creatinine1.62, BUN12, Potassium4.9, Sodium140, GFR41-47 01/24/2019 Creatinine2.90, BUN25, Potassium3.9, Sodium140, GFR20-23  01/13/2019 Creatinine2.30, BUN21, Potassium4.5, Sodium136, [id number] A complete set of results can be found in Results Review.  Recommendations: Unable to reach.     Follow-up plan: ICM clinic phone appointment on2/16/2021. 91 day device clinic remote transmission3/30/2021.Officeappt with Dr McLean2/23/2021  Copy of ICM check sent to Dr.Allred. Will send phone note to Dr Aundra Dubin for review and recommendations if needed.  3 month ICM trend: 01/02/2020    1 Year ICM trend:       Rosalene Billings, RN 01/03/2020 1:24 PM

## 2020-01-03 NOTE — Telephone Encounter (Signed)
Remote ICM transmission received.  Attempted call to wife, regarding ICM remote transmission and left detailed message per DPR to return call.

## 2020-01-04 ENCOUNTER — Telehealth: Payer: Self-pay

## 2020-01-04 DIAGNOSIS — N183 Chronic kidney disease, stage 3 unspecified: Secondary | ICD-10-CM | POA: Diagnosis not present

## 2020-01-04 NOTE — Telephone Encounter (Signed)
Spoke with wife.  She reports at the Nephrologist office visit today Torsemide increased to 40 mg daily x 2 weeks due to patients shortness of breath.  Labs drawn today at nephrologist office and has a follow up appointment in 2 weeks.   Wife informed nephrologist the device report shows possible fluid accumulation as well.   Optivol thoracic impedancesuggesting possible fluid accumulation since December 2020 with exception of few days at baseline normal.   Follow-up plan: ICM clinic phone appointment on2/16/2021.   Copy sent to Dr Aundra Dubin as Juluis Rainier that Nephrologist increased Torsemide dosage to 40 mg at today's office visit and to advise if any further recommendations are needed.    3 month ICM trend: 01/02/2020    1 Year ICM trend:

## 2020-01-04 NOTE — Progress Notes (Signed)
Spoke with wife.  She reports patient continues to have shortness of breath but swelling have his feet have improved.  Patient has appt today with Kidney physician and she will call back with an update.  Advised to inform nephrologist hat his remote transmission report is still showing fluid accumulation although has improved since January.  Will wait for return call from wife for an update.

## 2020-01-04 NOTE — Progress Notes (Signed)
Spoke with wife after patient's visit with nephrologist.  The kidney doctor thinks the shortness of breath is caused by fluid accumulation and increased Torsemide to 40 mg daily x 2 weeks.  Labs were drawn today and he has a follow up visit with kidney physician in 2 weeks.  She will call the nephrologist office if symptoms do not improve.

## 2020-01-05 DIAGNOSIS — N189 Chronic kidney disease, unspecified: Secondary | ICD-10-CM | POA: Diagnosis not present

## 2020-01-05 DIAGNOSIS — D631 Anemia in chronic kidney disease: Secondary | ICD-10-CM | POA: Diagnosis not present

## 2020-01-08 NOTE — Progress Notes (Signed)
Increase torsemide to 20 mg daily alternating with 40 mg daily. BMET 1 week.

## 2020-01-10 ENCOUNTER — Telehealth: Payer: Self-pay

## 2020-01-10 ENCOUNTER — Ambulatory Visit (INDEPENDENT_AMBULATORY_CARE_PROVIDER_SITE_OTHER): Payer: Medicare Other

## 2020-01-10 DIAGNOSIS — I5022 Chronic systolic (congestive) heart failure: Secondary | ICD-10-CM

## 2020-01-10 DIAGNOSIS — Z95810 Presence of automatic (implantable) cardiac defibrillator: Secondary | ICD-10-CM

## 2020-01-10 MED ORDER — TORSEMIDE 10 MG PO TABS: ORAL_TABLET | ORAL | 3 refills | Status: DC

## 2020-01-10 NOTE — Telephone Encounter (Signed)
Take torsemide 40 mg bid x 3 days then 40 qam/20 qpm with BMET in 1 week.

## 2020-01-10 NOTE — Telephone Encounter (Signed)
Attempted call back to wife and left message to return call.

## 2020-01-10 NOTE — Telephone Encounter (Signed)
Increase torsemide to 60 mg bid x 3 days then back to 40 mg bid.  BMET 1 week.

## 2020-01-10 NOTE — Telephone Encounter (Signed)
Spoke with wife and clarification given on Torsemide dosage patient has been taking since 2/10 (per Nephrologist recommendation).  Patient has been taking Torsemide 40 mg once a day NOT bid since 2/10.  Advised wife I will resend note to Dr Aundra Dubin informing him patient is taking Torsemide 40 mg daily, NOT 40 mg bid.    Dr Aundra Dubin based on this clarification of what patient has been taking, would you still like for to proceed with the recommendation to increase Torsemide to 60 mg bid x 3 days the to 40 bid?

## 2020-01-10 NOTE — Progress Notes (Signed)
EPIC Encounter for ICM Monitoring  Patient Name: Justin Hill is a 79 y.o. male Date: 01/10/2020 Primary Care Physican: Serita Grammes, MD Primary Cardiologist:McLean Electrophysiologist: Allred Bi-V Pacing: 98.9% 12/05/2019 Weight: 278lbs  Clinical Status (02-Jan-2020 to 10-Jan-2020)  AT/AF              8 episodes    Time in AT/AF  0.4 hr/day (1.7%)   Spoke with wife.  Patient continues to be symptomatic with:  Extreme shortness of breath  Tiredness all the time  Patient is receiving Iron infusions for anemia.  Patient is following the Nephrologist recommendation to take Torsemide 40 mg bid from 2/12 - 2/26.    Optivol thoracic impedancesuggesting no improvement since taking Torsemide 40 mg bid since 2/12 as ordered by Nephrologist.  Prescribed:Torsemide10mg take 2 tablets (20 mg total) by mouth daily.2/16 Taking Torsemide increased dosage as directed by Nephrologist.  Labs: 12/09/2019 Creatinine 1.95, BUN 18, Potassium 4.3, Sodium 136, GFR 32-38 10/14/2019 Creatinine 1.85, BUN 20, Potassium 5.0, Sodium 138, GFR 35-40 07/06/2019 Creatinine 1.74, BUN 16, Potassium 4.8, Sodium 139, GFR 37-43 04/27/2019 Creatinine1.96, BUN21, Potassium4.7, Sodium139, GFR32-37 04/12/2019 Creatinine1.89, BUN22, Potassium4.8, Sodium139, [id number] 5/11/20020 Creatinine 2.04, BUN 23, Potassium 5.3, Sodium 140, GFR 31-36 03/14/2019 Creatinine 1.73, BUN 17, Potassium 5.0, Sodium 139, GFR 38-43 02/07/2019 Creatinine1.62, BUN12, Potassium4.9, Sodium140, [id number] 01/24/2019 Creatinine2.90, BUN25, Potassium3.9, Sodium140, [id number]  01/13/2019 Creatinine2.30, BUN21, Potassium4.5, Sodium136, [id number] A complete set of results can be found in Results Review.  Recommendations:   Phone note sent to Dr Aundra Dubin for review and recommendations if needed.  Follow-up plan: ICM clinic phone appointment on2/23/2021 (manual send)  to recheck fluid levels.  91 day device clinic remote transmission3/30/2021.Officeappt with Dr McLean2/23/2021 and OV with Nephrologist 01/23/20.  Copy of ICM check sent to Dr.Allred. Will send phone note to Dr Aundra Dubin for review and recommendations if needed.  3 month ICM trend: 01/10/2020    1 Year ICM trend:       Rosalene Billings, RN 01/10/2020 1:11 PM

## 2020-01-10 NOTE — Telephone Encounter (Signed)
Call to wife and advised of Dr Claris Gladden recommendations.  She reports she was unable to write the information down and requested it be sent through mychart.  Recommendations sent as requested.

## 2020-01-10 NOTE — Progress Notes (Signed)
Call to wife and advised of Dr Claris Gladden recommendations.  She reports she was unable to write the information down and requested it be sent through mychart.  Recommendations sent as requested.  See ICM phone note for orders.

## 2020-01-10 NOTE — Progress Notes (Signed)
Patient's Torsemide has been adjusted by nephrologist to Torsemide 40 mg bid x 2 weeks prior to Dr Claris Gladden recommendations and wife will follow those instructions at this time.

## 2020-01-10 NOTE — Progress Notes (Signed)
Larey Dresser, MD  Physician  Specialty:  Cardiology  Telephone Encounter  Signed  Creation Time:  01/10/2020 3:33 PM          Signed        Take torsemide 40 mg bid x 3 days then 40 qam/20 qpm with BMET in 1 week.

## 2020-01-10 NOTE — Telephone Encounter (Signed)
Spoke with wife regarding fluid level recheck since last week.          Patient continues to be symptomatic with:  Extreme shortness of breath  Tiredness all the time  Patient is receiving Iron infusions for anemia.  Patient is following Nephrologist recommendations to temporarily take Torsemide 40 mg bid from 2/12 - 2/26.    Optivol thoracic impedancesuggesting no improvement since starting Torsemide 40 mg bid on 2/12 as ordered by Nephrologist.  Labs: 12/09/2019 Creatinine 1.95, BUN 18, Potassium 4.3, Sodium 136, GFR 32-38 10/14/2019 Creatinine 1.85, BUN 20, Potassium 5.0, Sodium 138, GFR 35-40  Note sent to Dr Aundra Dubin for review and recommendations if needed.  Officeappt with Dr McLean2/23/2021.   3 month ICM trend: 01/10/2020    1 Year ICM trend:

## 2020-01-16 ENCOUNTER — Other Ambulatory Visit: Payer: Self-pay

## 2020-01-16 ENCOUNTER — Telehealth (HOSPITAL_COMMUNITY): Payer: Self-pay

## 2020-01-16 DIAGNOSIS — N1832 Chronic kidney disease, stage 3b: Secondary | ICD-10-CM | POA: Diagnosis not present

## 2020-01-16 NOTE — Telephone Encounter (Signed)
COVID-19 pre-appointment screening questions: PATIENTS WIFE ANSWERED QUESTIONS   Do you have a history of COVID-19 or a positive test result in the past 7-10 days? NO  To the best of your knowledge, have you been in close contact with anyone with a confirmed diagnosis of COVID 19?NO  Have you had any one or more of the following: Fever, chills, cough, shortness of breath (out of the normal for you) or any flu-like symptoms?NO  Are you experiencing any of the following symptoms that is new or out of usual for you:NO  . Ear, nose or throat discomfort . Sore throat . Headache . Muscle Pain . Diarrhea . Loss of taste or smell   Reviewed all the following with patient: REVIEWED . Use of hand sanitizer when entering the building . Everyone is required to wear a mask in the building, if you do not have a mask we are happy to provide you with one when you arrive . NO Visitor guidelines   If patient answers YES to any of questions they must change to a virtual visit and place note in comments about symptoms

## 2020-01-17 ENCOUNTER — Telehealth: Payer: Self-pay

## 2020-01-17 ENCOUNTER — Ambulatory Visit (INDEPENDENT_AMBULATORY_CARE_PROVIDER_SITE_OTHER): Payer: Medicare Other

## 2020-01-17 ENCOUNTER — Ambulatory Visit (HOSPITAL_COMMUNITY)
Admission: RE | Admit: 2020-01-17 | Discharge: 2020-01-17 | Disposition: A | Discharge status: Home / Self Care | Payer: Medicare Other | Source: Ambulatory Visit | Attending: Cardiology | Admitting: Cardiology

## 2020-01-17 VITALS — BP 100/48 | HR 87 | Wt 282.0 lb

## 2020-01-17 DIAGNOSIS — I447 Left bundle-branch block, unspecified: Secondary | ICD-10-CM | POA: Insufficient documentation

## 2020-01-17 DIAGNOSIS — Z7901 Long term (current) use of anticoagulants: Secondary | ICD-10-CM | POA: Insufficient documentation

## 2020-01-17 DIAGNOSIS — E669 Obesity, unspecified: Secondary | ICD-10-CM | POA: Insufficient documentation

## 2020-01-17 DIAGNOSIS — Z79899 Other long term (current) drug therapy: Secondary | ICD-10-CM | POA: Diagnosis not present

## 2020-01-17 DIAGNOSIS — Z950 Presence of cardiac pacemaker: Secondary | ICD-10-CM | POA: Diagnosis not present

## 2020-01-17 DIAGNOSIS — G4733 Obstructive sleep apnea (adult) (pediatric): Secondary | ICD-10-CM | POA: Insufficient documentation

## 2020-01-17 DIAGNOSIS — Z794 Long term (current) use of insulin: Secondary | ICD-10-CM | POA: Insufficient documentation

## 2020-01-17 DIAGNOSIS — Z95810 Presence of automatic (implantable) cardiac defibrillator: Secondary | ICD-10-CM

## 2020-01-17 DIAGNOSIS — I4891 Unspecified atrial fibrillation: Secondary | ICD-10-CM | POA: Insufficient documentation

## 2020-01-17 DIAGNOSIS — I480 Paroxysmal atrial fibrillation: Secondary | ICD-10-CM | POA: Diagnosis not present

## 2020-01-17 DIAGNOSIS — I2510 Atherosclerotic heart disease of native coronary artery without angina pectoris: Secondary | ICD-10-CM | POA: Insufficient documentation

## 2020-01-17 DIAGNOSIS — I509 Heart failure, unspecified: Secondary | ICD-10-CM | POA: Insufficient documentation

## 2020-01-17 DIAGNOSIS — E1142 Type 2 diabetes mellitus with diabetic polyneuropathy: Secondary | ICD-10-CM | POA: Insufficient documentation

## 2020-01-17 DIAGNOSIS — E785 Hyperlipidemia, unspecified: Secondary | ICD-10-CM | POA: Diagnosis not present

## 2020-01-17 DIAGNOSIS — Z982 Presence of cerebrospinal fluid drainage device: Secondary | ICD-10-CM | POA: Diagnosis not present

## 2020-01-17 DIAGNOSIS — R410 Disorientation, unspecified: Secondary | ICD-10-CM | POA: Diagnosis not present

## 2020-01-17 DIAGNOSIS — I130 Hypertensive heart and chronic kidney disease with heart failure and stage 1 through stage 4 chronic kidney disease, or unspecified chronic kidney disease: Secondary | ICD-10-CM | POA: Insufficient documentation

## 2020-01-17 DIAGNOSIS — N183 Chronic kidney disease, stage 3 unspecified: Secondary | ICD-10-CM | POA: Diagnosis not present

## 2020-01-17 DIAGNOSIS — E1122 Type 2 diabetes mellitus with diabetic chronic kidney disease: Secondary | ICD-10-CM | POA: Diagnosis not present

## 2020-01-17 DIAGNOSIS — E11649 Type 2 diabetes mellitus with hypoglycemia without coma: Secondary | ICD-10-CM | POA: Diagnosis not present

## 2020-01-17 DIAGNOSIS — R4182 Altered mental status, unspecified: Secondary | ICD-10-CM | POA: Diagnosis not present

## 2020-01-17 DIAGNOSIS — I5022 Chronic systolic (congestive) heart failure: Secondary | ICD-10-CM

## 2020-01-17 DIAGNOSIS — G912 (Idiopathic) normal pressure hydrocephalus: Secondary | ICD-10-CM | POA: Diagnosis not present

## 2020-01-17 DIAGNOSIS — I4892 Unspecified atrial flutter: Secondary | ICD-10-CM | POA: Diagnosis not present

## 2020-01-17 DIAGNOSIS — Z87891 Personal history of nicotine dependence: Secondary | ICD-10-CM | POA: Diagnosis not present

## 2020-01-17 DIAGNOSIS — Z6839 Body mass index (BMI) 39.0-39.9, adult: Secondary | ICD-10-CM | POA: Insufficient documentation

## 2020-01-17 DIAGNOSIS — R404 Transient alteration of awareness: Secondary | ICD-10-CM | POA: Diagnosis not present

## 2020-01-17 DIAGNOSIS — E162 Hypoglycemia, unspecified: Secondary | ICD-10-CM | POA: Diagnosis not present

## 2020-01-17 DIAGNOSIS — T383X5A Adverse effect of insulin and oral hypoglycemic [antidiabetic] drugs, initial encounter: Secondary | ICD-10-CM | POA: Diagnosis not present

## 2020-01-17 DIAGNOSIS — E161 Other hypoglycemia: Secondary | ICD-10-CM | POA: Diagnosis not present

## 2020-01-17 LAB — BASIC METABOLIC PANEL
Anion gap: 11 (ref 5–15)
BUN: 17 mg/dL (ref 8–23)
CO2: 32 mmol/L (ref 22–32)
Calcium: 9.4 mg/dL (ref 8.9–10.3)
Chloride: 100 mmol/L (ref 98–111)
Creatinine, Ser: 2.18 mg/dL — ABNORMAL HIGH (ref 0.61–1.24)
GFR calc Af Amer: 33 mL/min — ABNORMAL LOW (ref >60)
GFR calc non Af Amer: 28 mL/min — ABNORMAL LOW (ref >60)
Glucose, Bld: 75 mg/dL (ref 70–99)
Potassium: 4.2 mmol/L (ref 3.5–5.1)
Sodium: 143 mmol/L (ref 135–145)

## 2020-01-17 MED ORDER — TORSEMIDE 20 MG PO TABS: ORAL_TABLET | ORAL | 4 refills | Status: DC

## 2020-01-17 NOTE — Progress Notes (Signed)
EPIC Encounter for ICM Monitoring  Patient Name: Justin Hill is a 79 y.o. male Date: 01/17/2020 Primary Care Physican: Serita Grammes, MD Primary Cardiologist:McLean Electrophysiologist: Allred Bi-V Pacing: 98.2% 01/17/2020 Weight: 287lbs  Clinical Status (10-Jan-2020 to 17-Jan-2020)  AT/AF              3 episodes    Time in AT/AF  0.8 hr/day (3.4%)    Spoke with wife.  Patient remains about the same with still feeling tired and some shortness of breath.  Optivol thoracic impedanceimproving after Torsemide increase but is not back at baseline normal yet.  Prescribed:Torsemide10mg take 4 tablets (40 mg total) by mouth every morning and 2 tablets (20 mg total) every evening.  Labs: 12/09/2019 Creatinine 1.95, BUN 18, Potassium 4.3, Sodium 136, GFR 32-38 10/14/2019 Creatinine 1.85, BUN 20, Potassium 5.0, Sodium 138, GFR 35-40 07/06/2019 Creatinine 1.74, BUN 16, Potassium 4.8, Sodium 139, GFR 37-43 04/27/2019 Creatinine1.96, BUN21, Potassium4.7, Sodium139, GFR32-37 04/12/2019 Creatinine1.89, BUN22, Potassium4.8, Sodium139, [id number] 5/11/20020 Creatinine 2.04, BUN 23, Potassium 5.3, Sodium 140, GFR 31-36 03/14/2019 Creatinine 1.73, BUN 17, Potassium 5.0, Sodium 139, GFR 38-43 02/07/2019 Creatinine1.62, BUN12, Potassium4.9, Sodium140, GFR41-47 01/24/2019 Creatinine2.90, BUN25, Potassium3.9, Sodium140, GFR20-23  01/13/2019 Creatinine2.30, BUN21, Potassium4.5, Sodium136, [id number] A complete set of results can be found in Results Review.  Recommendations: Has office visit today with Dr Aundra Dubin and will be given recommendations at that time if needed.  Follow-up plan: ICM clinic phone appointment on3/12/2019 to recheck fluid levels. 91 day device clinic remote transmission3/30/2021.Officeappt with Dr McLean2/23/2021 and OV with Nephrologist 01/23/20.  Copy of ICM check sent to Dr.Allred.  3 month ICM trend:  01/17/2020    1 Year ICM trend:       Rosalene Billings, RN 01/17/2020 12:08 PM

## 2020-01-17 NOTE — Patient Instructions (Addendum)
INCREASE Torsemide to 60mg  (3 tabs) in the morning and 40mg  (2 tabs) in the evening    Labs today and repeat in 10 days (a script was provided to be done locally) We will only contact you if something comes back abnormal or we need to make some changes. Otherwise no news is good news!   Your physician recommends that you schedule a follow-up appointment in: 1 month with the Nurse Practitioner/Physician Assistant   Please call office at [phone] option 2 if you have any questions or concerns.    At the [facility], you and your health needs are our priority. As part of our continuing mission to provide you with exceptional heart care, we have created designated Provider Care Teams. These Care Teams include your primary Cardiologist (physician) and Advanced Practice Providers (APPs- Physician Assistants and Nurse Practitioners) who all work together to provide you with the care you need, when you need it.   You may see any of the following providers on your designated Care Team at your next follow up: Marland Kitchen Dr Glori Bickers . Dr Loralie Champagne . Darrick Grinder, NP . Lyda Jester, PA . Audry Riles, PharmD   Please be sure to bring in all your medications bottles to every appointment.

## 2020-01-17 NOTE — Progress Notes (Signed)
ReDS Vest / Clip - 01/17/20 1500      ReDS Vest / Clip   Station Marker  D    Ruler Value  41    ReDS Value Range  Low volume    ReDS Actual Value  34

## 2020-01-17 NOTE — Telephone Encounter (Signed)
Spoke with wife and requested to send remote transmission to recheck fluid levels.  She will send now.

## 2020-01-18 ENCOUNTER — Ambulatory Visit (INDEPENDENT_AMBULATORY_CARE_PROVIDER_SITE_OTHER): Payer: Medicare Other | Admitting: Endocrinology

## 2020-01-18 ENCOUNTER — Encounter: Payer: Self-pay | Admitting: Endocrinology

## 2020-01-18 ENCOUNTER — Other Ambulatory Visit: Payer: Self-pay

## 2020-01-18 VITALS — BP 112/60 | HR 94 | Ht 71.0 in | Wt 279.4 lb

## 2020-01-18 DIAGNOSIS — Z794 Long term (current) use of insulin: Secondary | ICD-10-CM | POA: Diagnosis not present

## 2020-01-18 DIAGNOSIS — N1831 Chronic kidney disease, stage 3a: Secondary | ICD-10-CM | POA: Diagnosis not present

## 2020-01-18 DIAGNOSIS — E1121 Type 2 diabetes mellitus with diabetic nephropathy: Secondary | ICD-10-CM

## 2020-01-18 DIAGNOSIS — E1122 Type 2 diabetes mellitus with diabetic chronic kidney disease: Secondary | ICD-10-CM

## 2020-01-18 LAB — POCT GLYCOSYLATED HEMOGLOBIN (HGB A1C): Hemoglobin A1C: 6.4 % — AB (ref 4.0–5.6)

## 2020-01-18 MED ORDER — INSULIN NPH ISOPHANE & REGULAR (70-30) 100 UNIT/ML ~~LOC~~ SUSP: SUBCUTANEOUS | 11 refills | Status: DC

## 2020-01-18 NOTE — Progress Notes (Signed)
Patient ID: QUADRY KAMPA, male   DOB: [birth year], 79 y.o.   MRN: [id number] PCP: Dr. Camillia Herter Cardiology: Dr. Aundra Dubin  79 y.o. with history CKD, HTN, diabetes, chronic LBBB, and nonischemic cardiomyopathy presents for followup of CHF.  His cardiomyopathy has been known for years.  He had a LHC in 2008 showing mild nonobstructive CAD.     Mr Wolke was admitted in 2/16 with atrial flutter degenerating into atrial fibrillation.  He was initially cardioverted, then atrial flutter recurred.  He had atrial flutter ablation by Dr Rayann Heman in 2/16.  He thinks that he has been in NSR since that time, no further palpitations. He is on Xarelto now with no melena or BRBPR.    He is s/p placement of Medtronic CRT-D device.  Echo in 1/18 showed improvement in EF to 40-45%.  Echo in 5/20 showed EF up to 55-60%.   He developed normal pressure hydrocephalus and had VP shunt placed.  This has helped with his walking and some with his memory.   Weight is up about 11 lbs.  He is more short of breath, reports dyspnea with any prolonged walking.  Generally does ok walking around the house.  He has 2 pillow orthopnea.  No chest pain. No lightheadedness.   Medtronic device interrogation: Occasional short atrial fibrillation episodes.  Fluid index > threshold, decreased thoracic impedance.       ECG (personally reviewed): NSR, BiV paced  Labs (5/14): K 4, creatinine 1.4, LFTs normal, LDL 46, HDL 37 Labs (1/15): K 4.4, creatinine 1.46, LDL 74, HDL 35 Labs (4/15); LDL 96, HDL 44, K 5.2, creatinine 1.4 Labs (1/16): K 4.9, creatinine 1.36 Labs (2/16): K 4.5, creatinine 1.56, BNP 412 Labs (9/16): K 4.8, creatinine 1.33, hgb 14.7 Labs (11/16): K 4.3, creatinine 1.37, BNP 52 Labs (1/17): K 4.5 => 5, creatinine 1.35 => 1.17, HCT 47.4 Labs (2/17): K 4.9, creatinine 1.35 Labs (5/17): LDL 48, LFTs normal Labs (9/17): K 5, creatinine 1.21 Labs (11/17): HCT 41.4, digoxin 0.3, K 4.9, creatinine 1.27 Labs (5/18): K 5.2,  creatinine 1.47 Labs (7/18): digoxin 0.3 Labs (9/18): LDL 82, HDL 49, K 4.5, creatinine 1.34 Labs (3/19): digoxin 0.4, K 4.5, creatinine 1.42 Labs (6/19): K 5.2, LDL 76 Labs (10/19): K 5, creatinine 1.57 Labs (11/19): creatinine 1.9 Labs (12/19): K 4.5, creatinine 1.66 Labs (8/20): K 4.8, creatinine 1.74, hgb 11.7 Labs (11/20): LDL 71 Labs (1/21): K 4.3, creatinine 1.95  PMH: 1. Type II diabetes with peripheral neuropathy and nephropathy.  2. CKD stage 3 3. Hyperlipidemia 4. HTN: ACEI cough.  5. Nonischemic cardiomyopathy: This was diagnosed prior to 2008.  In 2008, patient had LHC with only mild nonobstructive coronary disease.  Echo in 1/14 showed EF 25-30% with normal RV.  Patient has not tolerated ACEI due to hyperkalemia and AKI. ICD was discussed (Dr Lovena Le) and decided against given NYHA class I symptoms.  Echo (7/15) with EF 25-30%, septal-lateral dyssynchrony, mild LV dilation. Echo (6/16) with EF 25-30%, mild LVH, septal-lateral dyssynchrony, normal RV size and systolic function. CPX (12/16) with peak VO2 14.5 (69% predicted), VE/VCO2 34, RER 1.19 => mild functional impairment from heart failure, restrictive lung function likely due to body habitus.  - LHC/RHC (6/17): 30% mid LAD stenosis; mean RA 5, PA 33/10, PCWP mean 16, CI 1.95.  - Medtronic CRT-D placed 7/17.  - Echo (1/18): EF 40-45%, mild LVH.  - Echo (3/19): EF 40-45%, moderate LVH, normal RV size/function.  - Echo (5/20): EF 55-60%, normal RV  size and systolic function.  6. Chronic LBBB 7. Obesity 8. Atrial flutter and atrial fibrillation: S/p atrial flutter ablation in 2/16 (Allred).  9. Normal pressure hydrocephalus: s/p VP shunt.  10. OSA: Uses CPAP.   SH: Lives in [place], married, prior smoker, no ETOH.  1 child.   FH: Grandmother with CHF.  Mother with CVA.   ROS: All systems reviewed and negative except as per HPI.    Current Outpatient Medications  Medication Sig Dispense Refill  . acetaminophen  (TYLENOL) 500 MG tablet Take 500 mg by mouth every 6 (six) hours as needed.    . Ascorbic Acid (VITAMIN C) 1000 MG tablet Take 1 tablet by mouth 2 (two) times daily.     . carvedilol (COREG) 25 MG tablet TAKE 1 TABLET BY MOUTH TWICE DAILY WITH MEALS 180 tablet 0  . cholecalciferol (VITAMIN D3) 25 MCG (1000 UNIT) tablet Take 1,000 Units by mouth daily.    . Cinnamon 500 MG TABS Take 1 tablet (500 mg total) by mouth daily.    . fenofibrate 54 MG tablet Take 1 tablet by mouth once daily 90 tablet 0  . Fluticasone-Umeclidin-Vilant (TRELEGY ELLIPTA) 100-62.5-25 MCG/INH AEPB Inhale 1 puff into the lungs daily.    Marland Kitchen loratadine (CLARITIN) 10 MG tablet Take 1 tablet by mouth 2 (two) times daily.     . Multiple Vitamin (MULTIVITAMIN) tablet Take 1 tablet by mouth daily.      Marland Kitchen omeprazole (PRILOSEC) 40 MG capsule Take 40 mg by mouth daily. OTC    . Rivaroxaban (XARELTO) 15 MG TABS tablet Take 1 tablet (15 mg total) by mouth daily with supper. 30 tablet 6  . sacubitril-valsartan (ENTRESTO) 24-26 MG Take 1 tablet by mouth 2 (two) times daily. 180 tablet 3  . simvastatin (ZOCOR) 40 MG tablet Take 1 tablet (40 mg total) by mouth at bedtime. 90 tablet 3  . tadalafil (CIALIS) 5 MG tablet Take 5 mg by mouth daily.  3  . tamsulosin (FLOMAX) 0.4 MG CAPS capsule Take 0.4 mg by mouth daily.    Marland Kitchen torsemide (DEMADEX) 20 MG tablet Take 3 tablets (60 mg total) by mouth every morning AND 2 tablets (40 mg total) every evening. 150 tablet 4  . insulin NPH-regular Human (70-30) 100 UNIT/ML injection 150 units with breakfast, and 30 units with supper, and syringes 2/day 100 mL 11   No current facility-administered medications for this encounter.    BP (!) 100/48   Pulse 87   Wt 127.9 kg (282 lb)   SpO2 98%   BMI 39.33 kg/m  General: NAD Neck: JVP 8-9 cm, no thyromegaly or thyroid nodule.  Lungs: Clear to auscultation bilaterally with normal respiratory effort. CV: Nondisplaced PMI.  Heart regular S1/S2, no S3/S4, no  murmur.  2+ edema 1/2 to knees bilaterally.  No carotid bruit.  Normal pedal pulses.  Abdomen: Soft, nontender, no hepatosplenomegaly, no distention.  Skin: Intact without lesions or rashes.  Neurologic: Alert and oriented x 3.  Psych: Normal affect. Extremities: No clubbing or cyanosis.  HEENT: Normal.   Assessment/Plan: 1. Cardiomyopathy: Nonischemic.  Low EF x years. Echo 6/16 showed stable EF 25-30% with septal-lateral dyssynchrony.  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  Interestingly, it also appears that he has a long-standing LBBB.  A chronic LBBB itself can potentially cause a cardiomyopathy and may be the source of his low EF.   Coronary angiography in 6/17  with nonobstructive disease. Now s/p Medtronic CRT-D device placement.  Most recent echo in 5/20 showed EF normalized, 55-60%.  NYHA class III symptoms.  He looks volume overloaded by exam and Optivol.      - Continue Coreg 25 mg bid.  - Continue Entresto 24/26 bid, dose decreased in past due to lightheadedess.   - K and creatinine have been high, he has not been on spironolactone.  - Increase torsemide to 60 qam/40 qpm, BMET today and in 10 days.      2. Hyperlipidemia: Patient is on simvastatin.  Good lipids in 11/20.  3. CKD: Stage 3.  Creatinine higher recently, will have to follow closely with diuresis.  - BMET today.  4. Atrial flutter: s/p ablation, in NSR today.  5. Atrial fibrillation: Patient had afib noted as well as flutter.He is in NSR today.  - Continue Xarelto 15 mg daily.  6. Normal pressure hydrocephalus: Has VP shunt.   7. OSA: Continue to use CPAP.   Followup in 1 month with APP.   Loralie Champagne 01/18/2020

## 2020-01-18 NOTE — Progress Notes (Signed)
Subjective:    Patient ID: Justin Hill, male    DOB: 1941-03-23, 79 y.o.   MRN: [id number]  HPI Pt returns for f/u of DM: DM type: Insulin-requiring type 2.  Dx'ed: 7564 Complications: CHF, toe ulcer, partial toe amputation, polyneuropathy, and renal insufficiency.  Therapy: insulin since 2012.  DKA: never.   Severe hypoglycemia: once, in 2021 Pancreatitis: never.     SDOH: wife gives insulin, due to his memory loss.   Other: he takes human insulin, due to cost; in October of 2014, he was changed from multiple daily injections to BID, due to persistently high a1c.    Interval history: Wife provides hs.  He has in the ER in the middle of last night, with near-syncope, due to severe hypoglycemia (glucose was 63).  Wife says this is because he took insulin last night, but then did not eat.  She says most cbg's have been in the 100's.  It is in general higher as the day goes on.  He takes 150 units with breakfast, and 60 units with supper.  Last Fe infusion was 2 weeks ago.   Past Medical History:  Diagnosis Date  . Altered mental status   . Cardiomyopathy   . CHF (congestive heart failure) ([place])   . Chronic kidney disease (CKD), stage III (moderate)   . Communicating hydrocephalus ([place])   . Diabetes mellitus    type 2  . Diverticulosis   . History of kidney stones   . Hypercholesterolemia   . Hyperglycemia   . Hypertension   . Paroxysmal atrial fibrillation (HCC)   . Personal history of kidney stones   . Pneumonia   . Sleep apnea    uses cpap    Past Surgical History:  Procedure Laterality Date  . ATRIAL FLUTTER ABLATION N/A 01/19/2015   Procedure: ATRIAL FLUTTER ABLATION;  Surgeon: [name], MD;  Location: [facility] CATH LAB;  Service: Cardiovascular;  Laterality: N/A;  . CARDIAC CATHETERIZATION N/A 05/02/2016   Procedure: Right/Left Heart Cath and Coronary Angiography;  Surgeon: Larey Dresser, MD;  Location: [place] CV LAB;  Service: Cardiovascular;  Laterality: N/A;    . COLONOSCOPY N/A 12/29/2013   Procedure: COLONOSCOPY;  Surgeon: Lafayette Dragon, MD;  Location: [facility] ENDOSCOPY;  Service: Endoscopy;  Laterality: N/A;  . EP IMPLANTABLE DEVICE N/A 06/05/2016   MDT Hillery Aldo XT CRTD implanted by Dr Rayann Heman   . EYE SURGERY     cataract surgery (not sure which eye)  . [place]   left. Muscle reattachment  . TOE SURGERY Right 12/2012   2nd toe  . TONSILLECTOMY    . VENTRICULOPERITONEAL SHUNT Right 11/20/2017   Procedure: SHUNT PLACEMENT RIGHT;  Surgeon: Newman Pies, MD;  Location: [place];  Service: Neurosurgery;  Laterality: Right;  SHUNT PLACEMENT RIGHT    Social History   Socioeconomic History  . Marital status: Married    Spouse name: Rosemarie Beath  . Number of children: 1  . Years of education: 45  . Highest education level: Not on file  Occupational History  . Occupation: retired  Tobacco Use  . Smoking status: Former Smoker    Types: Cigarettes  . Smokeless tobacco: Never Used  Substance and Sexual Activity  . Alcohol use: No  . Drug use: No  . Sexual activity: Not on file  Other Topics Concern  . Not on file  Social History Narrative   Regular exercise-no   Rare caffeine use    Social  Determinants of Health   Financial Resource Strain:   . Difficulty of Paying Living Expenses: Not on file  Food Insecurity:   . Worried About Charity fundraiser in the Last Year: Not on file  . Ran Out of Food in the Last Year: Not on file  Transportation Needs:   . Lack of Transportation (Medical): Not on file  . Lack of Transportation (Non-Medical): Not on file  Physical Activity:   . Days of Exercise per Week: Not on file  . Minutes of Exercise per Session: Not on file  Stress:   . Feeling of Stress : Not on file  Social Connections:   . Frequency of Communication with Friends and Family: Not on file  . Frequency of Social Gatherings with Friends and Family: Not on file  . Attends Religious Services: Not on file  . Active Member  of Clubs or Organizations: Not on file  . Attends Archivist Meetings: Not on file  . Marital Status: Not on file  Intimate Partner Violence:   . Fear of Current or Ex-Partner: Not on file  . Emotionally Abused: Not on file  . Physically Abused: Not on file  . Sexually Abused: Not on file    Current Outpatient Medications on File Prior to Visit  Medication Sig Dispense Refill  . acetaminophen (TYLENOL) 500 MG tablet Take 500 mg by mouth every 6 (six) hours as needed.    . Ascorbic Acid (VITAMIN C) 1000 MG tablet Take 1 tablet by mouth 2 (two) times daily.     . carvedilol (COREG) 25 MG tablet TAKE 1 TABLET BY MOUTH TWICE DAILY WITH MEALS 180 tablet 0  . cholecalciferol (VITAMIN D3) 25 MCG (1000 UNIT) tablet Take 1,000 Units by mouth daily.    . Cinnamon 500 MG TABS Take 1 tablet (500 mg total) by mouth daily.    . fenofibrate 54 MG tablet Take 1 tablet by mouth once daily 90 tablet 0  . Fluticasone-Umeclidin-Vilant (TRELEGY ELLIPTA) 100-62.5-25 MCG/INH AEPB Inhale 1 puff into the lungs daily.    Marland Kitchen loratadine (CLARITIN) 10 MG tablet Take 1 tablet by mouth 2 (two) times daily.     . Multiple Vitamin (MULTIVITAMIN) tablet Take 1 tablet by mouth daily.      Marland Kitchen omeprazole (PRILOSEC) 40 MG capsule Take 40 mg by mouth daily. OTC    . Rivaroxaban (XARELTO) 15 MG TABS tablet Take 1 tablet (15 mg total) by mouth daily with supper. 30 tablet 6  . sacubitril-valsartan (ENTRESTO) 24-26 MG Take 1 tablet by mouth 2 (two) times daily. 180 tablet 3  . simvastatin (ZOCOR) 40 MG tablet Take 1 tablet (40 mg total) by mouth at bedtime. 90 tablet 3  . tadalafil (CIALIS) 5 MG tablet Take 5 mg by mouth daily.  3  . tamsulosin (FLOMAX) 0.4 MG CAPS capsule Take 0.4 mg by mouth daily.    Marland Kitchen torsemide (DEMADEX) 20 MG tablet Take 3 tablets (60 mg total) by mouth every morning AND 2 tablets (40 mg total) every evening. 150 tablet 4   No current facility-administered medications on file prior to visit.     Allergies  Allergen Reactions  . Penicillins Anaphylaxis and Other (See Comments)    SYNCOPE PATIENT HAS HAD A PCN REACTION WITH IMMEDIATE RASH, FACIAL/TONGUE/THROAT SWELLING, SOB, OR LIGHTHEADEDNESS WITH HYPOTENSION:  #  #  #  YES  #  #  #   Has patient had a PCN reaction causing severe rash involving mucus membranes  or skin necrosis: no PATIENT HAS HAD A PCN REACTION THAT REQUIRED HOSPITALIZATION:  #  #  #  YES  #  #  #  Has patient had a PCN reaction occurring within the last 10 years: no    . Aspirin Other (See Comments)    Burps, burning, stomach pains, etc  . Lisinopril Cough    Severe coughing    Family History  Problem Relation Age of Onset  . Heart failure Maternal Grandmother   . Diabetes type II Sister 36  . Cancer Sister   . Hyperlipidemia Other        Parent  . Stroke Other        Parent  . Hypertension Other        Parent  . Diabetes Other        Parent  . Colon cancer Neg Hx   . Heart attack Neg Hx     BP 112/60 (BP Location: Left Arm, Patient Position: Sitting, Cuff Size: Large)   Pulse 94   Ht 5\' 11"  (1.803 m)   Wt 279 lb 6.4 oz (126.7 kg)   SpO2 94%   BMI 38.97 kg/m   Review of Systems He has gained 9 lbs since last ov.      Objective:   Physical Exam VITAL SIGNS:  See vs page GENERAL: no distress.   Pulses: dorsalis pedis intact bilat.   MSK: no deformity of the feet.  Distal right 2nd toe is absent.  CV: 1+ bilat leg edema.  Skin:  no ulcer on the feet, but the skin is dry.  normal color and temp on the feet. Neuro: sensation is intact to touch on the feet, but decreased from normal Ext: there is bilateral onychomycosis of the toenails.    Lab Results  Component Value Date   HGBA1C 6.4 (A) 01/18/2020   Lab Results  Component Value Date   CREATININE 2.18 (H) 01/17/2020   BUN 17 01/17/2020   NA 143 01/17/2020   K 4.2 01/17/2020   CL 100 01/17/2020   CO2 32 01/17/2020      Assessment & Plan:  Insulin-requiring type 2 DM,  with renal insuff: overcontrolled. Hypoglycemia: this limits aggressiveness of glycemic control Anemia: Fe infusion could artificially lower A1c Memory loss: he is not a candidate for aggressive glycemic control.  Patient Instructions  Please reduce the insulin to 150 units with breakfast, and 30 units with supper.     On this type of insulin schedule, you should eat meals on a regular schedule.  If a meal is missed or significantly.  delayed, your blood sugar could go low.   check your blood sugar twice a day.  vary the time of day when you check, between before the 3 meals, and at bedtime.  also check if you have symptoms of your blood sugar being too high or too low.  please keep a record of the readings and bring it to your next appointment here.  please call us sooner if your blood sugar goes below 70, or if you have a lot of readings over 200.   If you have any blood sugar under 80, please write on the paper why you think that was.   Please come back for a follow-up appointment in 6 weeks.

## 2020-01-18 NOTE — Patient Instructions (Addendum)
Please reduce the insulin to 150 units with breakfast, and 30 units with supper.     On this type of insulin schedule, you should eat meals on a regular schedule.  If a meal is missed or significantly.  delayed, your blood sugar could go low.   check your blood sugar twice a day.  vary the time of day when you check, between before the 3 meals, and at bedtime.  also check if you have symptoms of your blood sugar being too high or too low.  please keep a record of the readings and bring it to your next appointment here.  please call us sooner if your blood sugar goes below 70, or if you have a lot of readings over 200.   If you have any blood sugar under 80, please write on the paper why you think that was.   Please come back for a follow-up appointment in 6 weeks.

## 2020-01-24 ENCOUNTER — Inpatient Hospital Stay (HOSPITAL_COMMUNITY)
Admission: EM | Admit: 2020-01-24 | Discharge: 2020-02-01 | DRG: 286 | Disposition: A | Discharge status: Skilled Nursing Facility | Payer: Medicare Other | Attending: Internal Medicine | Admitting: Internal Medicine

## 2020-01-24 ENCOUNTER — Other Ambulatory Visit: Payer: Self-pay

## 2020-01-24 ENCOUNTER — Encounter (HOSPITAL_COMMUNITY): Payer: Self-pay | Admitting: Emergency Medicine

## 2020-01-24 ENCOUNTER — Emergency Department (HOSPITAL_COMMUNITY): Payer: Medicare Other

## 2020-01-24 ENCOUNTER — Ambulatory Visit (INDEPENDENT_AMBULATORY_CARE_PROVIDER_SITE_OTHER): Payer: Medicare Other

## 2020-01-24 DIAGNOSIS — I4892 Unspecified atrial flutter: Secondary | ICD-10-CM | POA: Diagnosis present

## 2020-01-24 DIAGNOSIS — I082 Rheumatic disorders of both aortic and tricuspid valves: Secondary | ICD-10-CM | POA: Diagnosis present

## 2020-01-24 DIAGNOSIS — G4733 Obstructive sleep apnea (adult) (pediatric): Secondary | ICD-10-CM | POA: Diagnosis present

## 2020-01-24 DIAGNOSIS — I10 Essential (primary) hypertension: Secondary | ICD-10-CM | POA: Diagnosis not present

## 2020-01-24 DIAGNOSIS — I2510 Atherosclerotic heart disease of native coronary artery without angina pectoris: Secondary | ICD-10-CM | POA: Diagnosis present

## 2020-01-24 DIAGNOSIS — I447 Left bundle-branch block, unspecified: Secondary | ICD-10-CM | POA: Diagnosis present

## 2020-01-24 DIAGNOSIS — D509 Iron deficiency anemia, unspecified: Secondary | ICD-10-CM | POA: Diagnosis not present

## 2020-01-24 DIAGNOSIS — Z886 Allergy status to analgesic agent status: Secondary | ICD-10-CM

## 2020-01-24 DIAGNOSIS — R0602 Shortness of breath: Secondary | ICD-10-CM | POA: Diagnosis not present

## 2020-01-24 DIAGNOSIS — Z880 Allergy status to penicillin: Secondary | ICD-10-CM

## 2020-01-24 DIAGNOSIS — Z982 Presence of cerebrospinal fluid drainage device: Secondary | ICD-10-CM

## 2020-01-24 DIAGNOSIS — N1832 Chronic kidney disease, stage 3b: Secondary | ICD-10-CM | POA: Diagnosis present

## 2020-01-24 DIAGNOSIS — R21 Rash and other nonspecific skin eruption: Secondary | ICD-10-CM | POA: Diagnosis present

## 2020-01-24 DIAGNOSIS — I509 Heart failure, unspecified: Secondary | ICD-10-CM

## 2020-01-24 DIAGNOSIS — Z8249 Family history of ischemic heart disease and other diseases of the circulatory system: Secondary | ICD-10-CM

## 2020-01-24 DIAGNOSIS — N183 Chronic kidney disease, stage 3 unspecified: Secondary | ICD-10-CM | POA: Diagnosis present

## 2020-01-24 DIAGNOSIS — I5023 Acute on chronic systolic (congestive) heart failure: Secondary | ICD-10-CM | POA: Diagnosis not present

## 2020-01-24 DIAGNOSIS — I483 Typical atrial flutter: Secondary | ICD-10-CM | POA: Diagnosis not present

## 2020-01-24 DIAGNOSIS — E662 Morbid (severe) obesity with alveolar hypoventilation: Secondary | ICD-10-CM | POA: Diagnosis not present

## 2020-01-24 DIAGNOSIS — D631 Anemia in chronic kidney disease: Secondary | ICD-10-CM | POA: Diagnosis present

## 2020-01-24 DIAGNOSIS — E785 Hyperlipidemia, unspecified: Secondary | ICD-10-CM | POA: Diagnosis present

## 2020-01-24 DIAGNOSIS — I5021 Acute systolic (congestive) heart failure: Secondary | ICD-10-CM | POA: Diagnosis present

## 2020-01-24 DIAGNOSIS — Z6837 Body mass index (BMI) 37.0-37.9, adult: Secondary | ICD-10-CM

## 2020-01-24 DIAGNOSIS — I5031 Acute diastolic (congestive) heart failure: Secondary | ICD-10-CM | POA: Diagnosis not present

## 2020-01-24 DIAGNOSIS — M2550 Pain in unspecified joint: Secondary | ICD-10-CM | POA: Diagnosis not present

## 2020-01-24 DIAGNOSIS — E1122 Type 2 diabetes mellitus with diabetic chronic kidney disease: Secondary | ICD-10-CM | POA: Diagnosis present

## 2020-01-24 DIAGNOSIS — N179 Acute kidney failure, unspecified: Secondary | ICD-10-CM | POA: Diagnosis present

## 2020-01-24 DIAGNOSIS — F0390 Unspecified dementia without behavioral disturbance: Secondary | ICD-10-CM | POA: Diagnosis present

## 2020-01-24 DIAGNOSIS — D500 Iron deficiency anemia secondary to blood loss (chronic): Secondary | ICD-10-CM | POA: Diagnosis not present

## 2020-01-24 DIAGNOSIS — Z79899 Other long term (current) drug therapy: Secondary | ICD-10-CM

## 2020-01-24 DIAGNOSIS — Z833 Family history of diabetes mellitus: Secondary | ICD-10-CM

## 2020-01-24 DIAGNOSIS — Z20822 Contact with and (suspected) exposure to covid-19: Secondary | ICD-10-CM | POA: Diagnosis present

## 2020-01-24 DIAGNOSIS — Z7901 Long term (current) use of anticoagulants: Secondary | ICD-10-CM

## 2020-01-24 DIAGNOSIS — Z950 Presence of cardiac pacemaker: Secondary | ICD-10-CM | POA: Diagnosis not present

## 2020-01-24 DIAGNOSIS — I429 Cardiomyopathy, unspecified: Secondary | ICD-10-CM | POA: Diagnosis not present

## 2020-01-24 DIAGNOSIS — E118 Type 2 diabetes mellitus with unspecified complications: Secondary | ICD-10-CM | POA: Diagnosis not present

## 2020-01-24 DIAGNOSIS — I428 Other cardiomyopathies: Secondary | ICD-10-CM | POA: Diagnosis present

## 2020-01-24 DIAGNOSIS — G910 Communicating hydrocephalus: Secondary | ICD-10-CM | POA: Diagnosis present

## 2020-01-24 DIAGNOSIS — I5020 Unspecified systolic (congestive) heart failure: Secondary | ICD-10-CM | POA: Diagnosis not present

## 2020-01-24 DIAGNOSIS — I5043 Acute on chronic combined systolic (congestive) and diastolic (congestive) heart failure: Secondary | ICD-10-CM | POA: Diagnosis present

## 2020-01-24 DIAGNOSIS — I5033 Acute on chronic diastolic (congestive) heart failure: Secondary | ICD-10-CM | POA: Diagnosis not present

## 2020-01-24 DIAGNOSIS — G9341 Metabolic encephalopathy: Secondary | ICD-10-CM | POA: Diagnosis present

## 2020-01-24 DIAGNOSIS — G912 (Idiopathic) normal pressure hydrocephalus: Secondary | ICD-10-CM | POA: Diagnosis present

## 2020-01-24 DIAGNOSIS — N401 Enlarged prostate with lower urinary tract symptoms: Secondary | ICD-10-CM | POA: Diagnosis not present

## 2020-01-24 DIAGNOSIS — N1831 Chronic kidney disease, stage 3a: Secondary | ICD-10-CM | POA: Diagnosis not present

## 2020-01-24 DIAGNOSIS — I5022 Chronic systolic (congestive) heart failure: Secondary | ICD-10-CM

## 2020-01-24 DIAGNOSIS — I130 Hypertensive heart and chronic kidney disease with heart failure and stage 1 through stage 4 chronic kidney disease, or unspecified chronic kidney disease: Principal | ICD-10-CM | POA: Diagnosis present

## 2020-01-24 DIAGNOSIS — R413 Other amnesia: Secondary | ICD-10-CM | POA: Diagnosis not present

## 2020-01-24 DIAGNOSIS — E669 Obesity, unspecified: Secondary | ICD-10-CM | POA: Diagnosis present

## 2020-01-24 DIAGNOSIS — I129 Hypertensive chronic kidney disease with stage 1 through stage 4 chronic kidney disease, or unspecified chronic kidney disease: Secondary | ICD-10-CM | POA: Diagnosis not present

## 2020-01-24 DIAGNOSIS — E875 Hyperkalemia: Secondary | ICD-10-CM | POA: Diagnosis present

## 2020-01-24 DIAGNOSIS — I480 Paroxysmal atrial fibrillation: Secondary | ICD-10-CM | POA: Diagnosis present

## 2020-01-24 DIAGNOSIS — I110 Hypertensive heart disease with heart failure: Secondary | ICD-10-CM | POA: Diagnosis not present

## 2020-01-24 DIAGNOSIS — Z8679 Personal history of other diseases of the circulatory system: Secondary | ICD-10-CM | POA: Diagnosis not present

## 2020-01-24 DIAGNOSIS — Z95810 Presence of automatic (implantable) cardiac defibrillator: Secondary | ICD-10-CM

## 2020-01-24 DIAGNOSIS — Z7401 Bed confinement status: Secondary | ICD-10-CM | POA: Diagnosis not present

## 2020-01-24 DIAGNOSIS — Z888 Allergy status to other drugs, medicaments and biological substances status: Secondary | ICD-10-CM

## 2020-01-24 DIAGNOSIS — D696 Thrombocytopenia, unspecified: Secondary | ICD-10-CM | POA: Diagnosis present

## 2020-01-24 DIAGNOSIS — R5381 Other malaise: Secondary | ICD-10-CM | POA: Diagnosis not present

## 2020-01-24 DIAGNOSIS — N200 Calculus of kidney: Secondary | ICD-10-CM | POA: Diagnosis not present

## 2020-01-24 DIAGNOSIS — Z87442 Personal history of urinary calculi: Secondary | ICD-10-CM

## 2020-01-24 DIAGNOSIS — R0600 Dyspnea, unspecified: Secondary | ICD-10-CM | POA: Diagnosis not present

## 2020-01-24 DIAGNOSIS — I959 Hypotension, unspecified: Secondary | ICD-10-CM | POA: Diagnosis not present

## 2020-01-24 DIAGNOSIS — Z794 Long term (current) use of insulin: Secondary | ICD-10-CM

## 2020-01-24 DIAGNOSIS — Z9989 Dependence on other enabling machines and devices: Secondary | ICD-10-CM | POA: Diagnosis not present

## 2020-01-24 LAB — CBC WITH DIFFERENTIAL/PLATELET
Abs Immature Granulocytes: 0.03 10*3/uL (ref 0.00–0.07)
Basophils Absolute: 0 10*3/uL (ref 0.0–0.1)
Basophils Relative: 1 %
Eosinophils Absolute: 0.2 10*3/uL (ref 0.0–0.5)
Eosinophils Relative: 3 %
HCT: 38.3 % — ABNORMAL LOW (ref 39.0–52.0)
Hemoglobin: 11.4 g/dL — ABNORMAL LOW (ref 13.0–17.0)
Immature Granulocytes: 1 %
Lymphocytes Relative: 19 %
Lymphs Abs: 1.1 10*3/uL (ref 0.7–4.0)
MCH: 25.5 pg — ABNORMAL LOW (ref 26.0–34.0)
MCHC: 29.8 g/dL — ABNORMAL LOW (ref 30.0–36.0)
MCV: 85.7 fL (ref 80.0–100.0)
Monocytes Absolute: 0.5 10*3/uL (ref 0.1–1.0)
Monocytes Relative: 9 %
Neutro Abs: 3.8 10*3/uL (ref 1.7–7.7)
Neutrophils Relative %: 67 %
Platelets: 148 10*3/uL — ABNORMAL LOW (ref 150–400)
RBC: 4.47 MIL/uL (ref 4.22–5.81)
RDW: 23.6 % — ABNORMAL HIGH (ref 11.5–15.5)
WBC: 5.7 10*3/uL (ref 4.0–10.5)
nRBC: 0 % (ref 0.0–0.2)

## 2020-01-24 LAB — BASIC METABOLIC PANEL
Anion gap: 10 (ref 5–15)
BUN: 26 mg/dL — ABNORMAL HIGH (ref 8–23)
CO2: 32 mmol/L (ref 22–32)
Calcium: 9.3 mg/dL (ref 8.9–10.3)
Chloride: 99 mmol/L (ref 98–111)
Creatinine, Ser: 2.46 mg/dL — ABNORMAL HIGH (ref 0.61–1.24)
GFR calc Af Amer: 28 mL/min — ABNORMAL LOW (ref >60)
GFR calc non Af Amer: 25 mL/min — ABNORMAL LOW (ref >60)
Glucose, Bld: 105 mg/dL — ABNORMAL HIGH (ref 70–99)
Potassium: 3.9 mmol/L (ref 3.5–5.1)
Sodium: 141 mmol/L (ref 135–145)

## 2020-01-24 LAB — CBG MONITORING, ED
Glucose-Capillary: 43 mg/dL — CL (ref 70–99)
Glucose-Capillary: 77 mg/dL (ref 70–99)

## 2020-01-24 LAB — SARS CORONAVIRUS 2 (TAT 6-24 HRS): SARS Coronavirus 2: NEGATIVE

## 2020-01-24 LAB — TROPONIN I (HIGH SENSITIVITY)
Troponin I (High Sensitivity): 9 ng/L (ref <18)
Troponin I (High Sensitivity): 9 ng/L (ref <18)

## 2020-01-24 LAB — GLUCOSE, CAPILLARY: Glucose-Capillary: 169 mg/dL — ABNORMAL HIGH (ref 70–99)

## 2020-01-24 LAB — BRAIN NATRIURETIC PEPTIDE: B Natriuretic Peptide: 35.1 pg/mL (ref 0.0–100.0)

## 2020-01-24 MED ORDER — INSULIN ASPART PROT & ASPART (70-30 MIX) 100 UNIT/ML ~~LOC~~ SUSP
125.0000 [IU] | Freq: Every day | SUBCUTANEOUS | Status: DC
  Administered 2020-01-25 – 2020-01-26 (×2): 125 [IU] via SUBCUTANEOUS
  Administered 2020-01-27: 63 [IU] via SUBCUTANEOUS
  Administered 2020-01-28 – 2020-01-31 (×3): 125 [IU] via SUBCUTANEOUS
  Filled 2020-01-24 (×2): qty 10

## 2020-01-24 MED ORDER — ONDANSETRON HCL 4 MG/2ML IJ SOLN: 4.0000 mg | Freq: Four times a day (QID) | INTRAMUSCULAR | Status: DC | PRN

## 2020-01-24 MED ORDER — ONDANSETRON HCL 4 MG PO TABS: 4.0000 mg | ORAL_TABLET | Freq: Four times a day (QID) | ORAL | Status: DC | PRN

## 2020-01-24 MED ORDER — INSULIN ASPART 100 UNIT/ML ~~LOC~~ SOLN
0.0000 [IU] | Freq: Three times a day (TID) | SUBCUTANEOUS | Status: DC
  Administered 2020-01-27 – 2020-01-28 (×3): 1 [IU] via SUBCUTANEOUS
  Administered 2020-01-29: 2 [IU] via SUBCUTANEOUS

## 2020-01-24 MED ORDER — PANTOPRAZOLE SODIUM 40 MG PO TBEC
40.0000 mg | DELAYED_RELEASE_TABLET | Freq: Every day | ORAL | Status: DC
  Administered 2020-01-25 – 2020-02-01 (×8): 40 mg via ORAL
  Filled 2020-01-24 (×3): qty 1
  Filled 2020-01-24 (×2): qty 2
  Filled 2020-01-24 (×2): qty 1
  Filled 2020-01-24: qty 2

## 2020-01-24 MED ORDER — VITAMIN D 25 MCG (1000 UNIT) PO TABS
1000.0000 [IU] | ORAL_TABLET | Freq: Every day | ORAL | Status: DC
  Administered 2020-01-25 – 2020-02-01 (×8): 1000 [IU] via ORAL
  Filled 2020-01-24 (×8): qty 1

## 2020-01-24 MED ORDER — ACETAMINOPHEN 325 MG PO TABS
650.0000 mg | ORAL_TABLET | Freq: Four times a day (QID) | ORAL | Status: DC | PRN
  Administered 2020-01-25: 650 mg via ORAL
  Filled 2020-01-24: qty 2

## 2020-01-24 MED ORDER — RIVAROXABAN 15 MG PO TABS
15.0000 mg | ORAL_TABLET | Freq: Every day | ORAL | Status: DC
  Administered 2020-01-25 – 2020-01-31 (×8): 15 mg via ORAL
  Filled 2020-01-24 (×8): qty 1

## 2020-01-24 MED ORDER — FENOFIBRATE 54 MG PO TABS
54.0000 mg | ORAL_TABLET | Freq: Every day | ORAL | Status: DC
  Administered 2020-01-25 – 2020-02-01 (×8): 54 mg via ORAL
  Filled 2020-01-24 (×8): qty 1

## 2020-01-24 MED ORDER — FUROSEMIDE 10 MG/ML IJ SOLN: 60.0000 mg | Freq: Two times a day (BID) | INTRAMUSCULAR | Status: DC

## 2020-01-24 MED ORDER — ACETAMINOPHEN 650 MG RE SUPP: 650.0000 mg | Freq: Four times a day (QID) | RECTAL | Status: DC | PRN

## 2020-01-24 MED ORDER — LORATADINE 10 MG PO TABS
10.0000 mg | ORAL_TABLET | Freq: Two times a day (BID) | ORAL | Status: DC
  Administered 2020-01-25 – 2020-02-01 (×16): 10 mg via ORAL
  Filled 2020-01-24 (×16): qty 1

## 2020-01-24 MED ORDER — ASCORBIC ACID 500 MG PO TABS
1000.0000 mg | ORAL_TABLET | Freq: Two times a day (BID) | ORAL | Status: DC
  Administered 2020-01-25 – 2020-02-01 (×16): 1000 mg via ORAL
  Filled 2020-01-24 (×16): qty 2

## 2020-01-24 MED ORDER — FLUTICASONE-UMECLIDIN-VILANT 100-62.5-25 MCG/INH IN AEPB: 1.0000 | INHALATION_SPRAY | Freq: Every day | RESPIRATORY_TRACT | Status: DC

## 2020-01-24 MED ORDER — ADULT MULTIVITAMIN W/MINERALS CH
1.0000 | ORAL_TABLET | Freq: Every day | ORAL | Status: DC
  Administered 2020-01-25 – 2020-02-01 (×8): 1 via ORAL
  Filled 2020-01-24 (×8): qty 1

## 2020-01-24 MED ORDER — CARVEDILOL 25 MG PO TABS
25.0000 mg | ORAL_TABLET | Freq: Two times a day (BID) | ORAL | Status: DC
  Administered 2020-01-25 – 2020-02-01 (×14): 25 mg via ORAL
  Filled 2020-01-24 (×4): qty 1
  Filled 2020-01-24: qty 2
  Filled 2020-01-24 (×5): qty 1
  Filled 2020-01-24: qty 4
  Filled 2020-01-24 (×4): qty 1

## 2020-01-24 MED ORDER — FLUTICASONE FUROATE-VILANTEROL 100-25 MCG/INH IN AEPB
1.0000 | INHALATION_SPRAY | Freq: Every day | RESPIRATORY_TRACT | Status: DC
  Administered 2020-01-25 – 2020-02-01 (×6): 1 via RESPIRATORY_TRACT
  Filled 2020-01-24: qty 28

## 2020-01-24 MED ORDER — UMECLIDINIUM BROMIDE 62.5 MCG/INH IN AEPB
1.0000 | INHALATION_SPRAY | Freq: Every day | RESPIRATORY_TRACT | Status: DC
  Administered 2020-01-25 – 2020-02-01 (×6): 1 via RESPIRATORY_TRACT
  Filled 2020-01-24: qty 7

## 2020-01-24 MED ORDER — INSULIN ASPART PROT & ASPART (70-30 MIX) 100 UNIT/ML ~~LOC~~ SUSP
15.0000 [IU] | Freq: Every day | SUBCUTANEOUS | Status: DC
  Administered 2020-01-26 – 2020-01-31 (×6): 15 [IU] via SUBCUTANEOUS
  Filled 2020-01-24: qty 10

## 2020-01-24 MED ORDER — SIMVASTATIN 20 MG PO TABS
40.0000 mg | ORAL_TABLET | Freq: Every day | ORAL | Status: DC
  Administered 2020-01-25 – 2020-01-31 (×8): 40 mg via ORAL
  Filled 2020-01-24 (×8): qty 2

## 2020-01-24 MED ORDER — FUROSEMIDE 10 MG/ML IJ SOLN: 40.0000 mg | Freq: Once | INTRAMUSCULAR | Status: DC

## 2020-01-24 MED ORDER — TAMSULOSIN HCL 0.4 MG PO CAPS
0.4000 mg | ORAL_CAPSULE | Freq: Every day | ORAL | Status: DC
  Administered 2020-01-25 – 2020-02-01 (×8): 0.4 mg via ORAL
  Filled 2020-01-24 (×8): qty 1

## 2020-01-24 MED ORDER — FUROSEMIDE 10 MG/ML IJ SOLN
60.0000 mg | Freq: Once | INTRAMUSCULAR | Status: AC
  Administered 2020-01-24: 60 mg via INTRAVENOUS
  Filled 2020-01-24: qty 6

## 2020-01-24 NOTE — ED Notes (Signed)
Justin Hill son [phone] call back about a question

## 2020-01-24 NOTE — ED Notes (Signed)
Internal Medicine MD at bedside

## 2020-01-24 NOTE — ED Notes (Signed)
CBG 43 - Dr. Tyrone Nine notified; pt alert, given [place] sandwich, graham crackers and orange juice

## 2020-01-24 NOTE — ED Notes (Signed)
Per MRI, unable to due scan today d/t pt having pacemaker; plan to do tomorrow when [employer] rep available; Dr. Tyrone Nine notified

## 2020-01-24 NOTE — Progress Notes (Signed)
EPIC Encounter for ICM Monitoring  Patient Name: Justin Hill is a 79 y.o. male Date: 01/24/2020 Primary Care Physican: Serita Grammes, MD Primary Cardiologist:McLean Electrophysiologist: Allred Bi-V Pacing: 98.4% 01/17/2020 Weight: 287lbs  Clinical Status (17-Jan-2020 to 24-Jan-2020)   AT/AF              2 episodes   Time in AT/AF  1.2 hr/day (5.1%)  Longest AT/AF  4 hours    Pt currently in ED for evaluation for SOB.   Optivol thoracic impedancereturned to baseline   Prescribed:Torsemide20mg take 3 tablets (60 mg total) by mouth every morning and 2 tablets (40 mg total) every evening.  Labs: 01/17/2020 Creatinine 2.18, BUN 17, Potassium 4.2, Sodium 143, GFR 28-33 12/09/2019 Creatinine 1.95, BUN 18, Potassium 4.3, Sodium 136, GFR 32-38 10/14/2019 Creatinine 1.85, BUN 20, Potassium 5.0, Sodium 138, GFR 35-40 07/06/2019 Creatinine 1.74, BUN 16, Potassium 4.8, Sodium 139, GFR 37-43 A complete set of results can be found in Results Review.  Recommendations:Patient currently in ER.   Follow-up plan: ICM clinic phone appointment on3/15/2021. 91 day device clinic remote transmission3/30/2021.Officeappt with Dr McLean3/23/2021.  Copy of ICM check sent to Dr.Allred.  3 month ICM trend: 01/24/2020    1 Year ICM trend:       Rosalene Billings, RN 01/24/2020 2:06 PM

## 2020-01-24 NOTE — ED Notes (Signed)
Pt very diaphoretic, states he is hot and it is not unusual for him to sweat a lot; pt asking for orange juice, states that's what he drinks when he sweats; denies CP, nausea, or increased SOB; Dr. Tyrone Nine notified, repeat EKG done and given to Dr. Tyrone Nine; pr given orange juice with permission from Dr. Tyrone Nine

## 2020-01-24 NOTE — H&P (Signed)
History and Physical    Justin Hill:[id number] DOB: November 08, 1941 DOA: 01/24/2020  PCP: Serita Grammes, MD  Patient coming from: Home.  Chief Complaint: Shortness of breath.  HPI: Justin Hill is a 79 y.o. male with history of CHF last EF measured was 43 to 60% on May 2020 prior to which patient had systolic dysfunction, history of CRT, history of ablation of atrial flutter history of A. fib on Xarelto, diabetes mellitus type 2, normal pressure hydrocephalus status post VP shunt, chronic kidney disease stage III baseline creatinine around 1.8 was referred from the primary care office after patient has become increasingly short of breath.  Patient states over the last 1 week patient has become increasingly short of breath denies any chest pain productive cough fever chills.  Denies any nausea vomiting or diarrhea.  ED Course: In the ER chest x-ray does not show anything acute on exam patient has bilateral lower extremity edema elevated JVD BNP was 35.1 high-sensitivity troponin was 9.  CBC shows anemia and thrombocytopenia which is chronic.  Creatinine is increased to 2.1 with baseline around 1.8-1.9.  Patient was given Lasix 60 mg IV and admitted for acute CHF.  Covid test was negative.  Review of Systems: As per HPI, rest all negative.   Past Medical History:  Diagnosis Date  . Altered mental status   . Cardiomyopathy   . CHF (congestive heart failure) ([place])   . Chronic kidney disease (CKD), stage III (moderate)   . Communicating hydrocephalus ([place])   . Diabetes mellitus    type 2  . Diverticulosis   . History of kidney stones   . Hypercholesterolemia   . Hyperglycemia   . Hypertension   . Paroxysmal atrial fibrillation (HCC)   . Personal history of kidney stones   . Pneumonia   . Sleep apnea    uses cpap    Past Surgical History:  Procedure Laterality Date  . ATRIAL FLUTTER ABLATION N/A 01/19/2015   Procedure: ATRIAL FLUTTER ABLATION;  Surgeon: [name],  MD;  Location: [facility] CATH LAB;  Service: Cardiovascular;  Laterality: N/A;  . CARDIAC CATHETERIZATION N/A 05/02/2016   Procedure: Right/Left Heart Cath and Coronary Angiography;  Surgeon: Larey Dresser, MD;  Location: [place] CV LAB;  Service: Cardiovascular;  Laterality: N/A;  . COLONOSCOPY N/A 12/29/2013   Procedure: COLONOSCOPY;  Surgeon: Lafayette Dragon, MD;  Location: [facility] ENDOSCOPY;  Service: Endoscopy;  Laterality: N/A;  . EP IMPLANTABLE DEVICE N/A 06/05/2016   MDT Hillery Aldo XT CRTD implanted by Dr Rayann Heman   . EYE SURGERY     cataract surgery (not sure which eye)  . [place]   left. Muscle reattachment  . TOE SURGERY Right 12/2012   2nd toe  . TONSILLECTOMY    . VENTRICULOPERITONEAL SHUNT Right 11/20/2017   Procedure: SHUNT PLACEMENT RIGHT;  Surgeon: Newman Pies, MD;  Location: [place];  Service: Neurosurgery;  Laterality: Right;  SHUNT PLACEMENT RIGHT     reports that he has quit smoking. His smoking use included cigarettes. He has never used smokeless tobacco. He reports that he does not drink alcohol or use drugs.  Allergies  Allergen Reactions  . Penicillins Anaphylaxis and Other (See Comments)    SYNCOPE PATIENT HAS HAD A PCN REACTION WITH IMMEDIATE RASH, FACIAL/TONGUE/THROAT SWELLING, SOB, OR LIGHTHEADEDNESS WITH HYPOTENSION:  #  #  #  YES  #  #  #   Has patient had a PCN reaction causing severe rash involving  mucus membranes or skin necrosis: no PATIENT HAS HAD A PCN REACTION THAT REQUIRED HOSPITALIZATION:  #  #  #  YES  #  #  #  Has patient had a PCN reaction occurring within the last 10 years: no    . Aspirin Other (See Comments)    Burps, burning, stomach pains, etc  . Lisinopril Other (See Comments) and Cough    Severe coughing    Family History  Problem Relation Age of Onset  . Heart failure Maternal Grandmother   . Diabetes type II Sister 60  . Cancer Sister   . Hyperlipidemia Other        Parent  . Stroke Other        Parent  . Hypertension  Other        Parent  . Diabetes Other        Parent  . Colon cancer Neg Hx   . Heart attack Neg Hx     Prior to Admission medications   Medication Sig Start Date End Date Taking? Authorizing Provider  acetaminophen (TYLENOL) 500 MG tablet Take 500-1,000 mg by mouth every 6 (six) hours as needed for mild pain or headache.     [provider]  Ascorbic Acid (VITAMIN C) 1000 MG tablet Take 1 tablet by mouth 2 (two) times daily.     [provider]  carvedilol (COREG) 25 MG tablet TAKE 1 TABLET BY MOUTH TWICE DAILY WITH MEALS 10/31/19   Bensimhon, Shaune Pascal, MD  cholecalciferol (VITAMIN D3) 25 MCG (1000 UNIT) tablet Take 1,000 Units by mouth daily.    [provider]  Cinnamon 500 MG TABS Take 1 tablet (500 mg total) by mouth daily. 05/18/19   Darrick Grinder D, NP  fenofibrate 54 MG tablet Take 1 tablet by mouth once daily 11/29/19   Bensimhon, Shaune Pascal, MD  Fluticasone-Umeclidin-Vilant (TRELEGY ELLIPTA) 100-62.5-25 MCG/INH AEPB Inhale 1 puff into the lungs daily.    [provider]  insulin NPH-regular Human (70-30) 100 UNIT/ML injection 150 units with breakfast, and 30 units with supper, and syringes 2/day 01/18/20   Renato Shin, MD  loratadine (CLARITIN) 10 MG tablet Take 1 tablet by mouth 2 (two) times daily.     [provider]  Multiple Vitamin (MULTIVITAMIN) tablet Take 1 tablet by mouth daily.      [provider]  omeprazole (PRILOSEC) 40 MG capsule Take 40 mg by mouth daily. OTC    [provider]  Rivaroxaban (XARELTO) 15 MG TABS tablet Take 1 tablet (15 mg total) by mouth daily with supper. 10/14/19   Larey Dresser, MD  sacubitril-valsartan (ENTRESTO) 24-26 MG Take 1 tablet by mouth 2 (two) times daily. 06/14/19   Larey Dresser, MD  simvastatin (ZOCOR) 40 MG tablet Take 1 tablet (40 mg total) by mouth at bedtime. 06/08/19   Bensimhon, Shaune Pascal, MD  tadalafil (CIALIS) 5 MG tablet Take 5 mg by mouth daily. 10/07/18   [provider]  tamsulosin (FLOMAX) 0.4 MG CAPS capsule Take 0.4 mg by mouth daily.    [provider]  torsemide (DEMADEX) 20 MG tablet Take 3 tablets (60 mg total) by mouth every morning AND 2 tablets (40 mg total) every evening. 01/17/20   Larey Dresser, MD    Physical Exam: Constitutional: Moderately built and nourished. Vitals:   01/24/20 1745 01/24/20 1800 01/24/20 1846 01/24/20 2053  BP: 120/61 122/61 117/78 120/62  Pulse: 66 60 67 82  Resp: 18 16  17 18  Temp:      TempSrc:      SpO2: 91% 91% 97% 97%  Weight:      Height:       Eyes: Anicteric no pallor. ENMT: No discharge from the ears eyes nose or mouth. Neck: No mass felt.  No neck rigidity. Respiratory: No rhonchi or crepitations. Cardiovascular: S1-S2 heard. Abdomen: Soft nontender bowel sounds present. Musculoskeletal: Mild edema of the both lower extremities. Skin: Chronic skin changes. Neurologic: Alert awake oriented to his name and place.  Moves all extremities. Psychiatric: Oriented to name and place.   Labs on Admission: I have personally reviewed following labs and imaging studies  CBC: Recent Labs  Lab 01/24/20 1414  WBC 5.7  NEUTROABS 3.8  HGB 11.4*  HCT 38.3*  MCV 85.7  PLT 101*   Basic Metabolic Panel: Recent Labs  Lab 01/24/20 1414  NA 141  K 3.9  CL 99  CO2 32  GLUCOSE 105*  BUN 26*  CREATININE 2.46*  CALCIUM 9.3   GFR: Estimated Creatinine Clearance: 34.7 mL/min (A) (by C-G formula based on SCr of 2.46 mg/dL (H)). Liver Function Tests: No results for input(s): AST, ALT, ALKPHOS, BILITOT, PROT, ALBUMIN in the last 168 hours. No results for input(s): LIPASE, AMYLASE in the last 168 hours. No results for input(s): AMMONIA in the last 168 hours. Coagulation Profile: No results for input(s): INR, PROTIME in the last 168 hours. Cardiac Enzymes: No results for input(s): CKTOTAL, CKMB, CKMBINDEX, TROPONINI in the last 168 hours. BNP (last 3 results) No results for  input(s): PROBNP in the last 8760 hours. HbA1C: No results for input(s): HGBA1C in the last 72 hours. CBG: Recent Labs  Lab 01/24/20 1736 01/24/20 1830  GLUCAP 43* 77   Lipid Profile: No results for input(s): CHOL, HDL, LDLCALC, TRIG, CHOLHDL, LDLDIRECT in the last 72 hours. Thyroid Function Tests: No results for input(s): TSH, T4TOTAL, FREET4, T3FREE, THYROIDAB in the last 72 hours. Anemia Panel: No results for input(s): VITAMINB12, FOLATE, FERRITIN, TIBC, IRON, RETICCTPCT in the last 72 hours. Urine analysis: No results found for: COLORURINE, APPEARANCEUR, LABSPEC, PHURINE, GLUCOSEU, HGBUR, BILIRUBINUR, KETONESUR, PROTEINUR, UROBILINOGEN, NITRITE, LEUKOCYTESUR Sepsis Labs: @LABRCNTIP (procalcitonin:4,lacticidven:4) ) Recent Results (from the past 240 hour(s))  SARS CORONAVIRUS 2 (TAT 6-24 HRS) Nasopharyngeal Nasopharyngeal Swab     Status: None   Collection Time: 01/24/20  1:17 PM   Specimen: Nasopharyngeal Swab  Result Value Ref Range Status   SARS Coronavirus 2 NEGATIVE NEGATIVE Final    Comment: (NOTE) SARS-CoV-2 target nucleic acids are NOT DETECTED. The SARS-CoV-2 RNA is generally detectable in upper and lower respiratory specimens during the acute phase of infection. Negative results do not preclude SARS-CoV-2 infection, do not rule out co-infections with other pathogens, and should not be used as the sole basis for treatment or other patient management decisions. Negative results must be combined with clinical observations, patient history, and epidemiological information. The expected result is Negative. Fact Sheet for Patients: SugarRoll.be Fact Sheet for Healthcare Providers: https://www.woods-mathews.com/ This test is not yet approved or cleared by the [place] FDA and  has been authorized for detection and/or diagnosis of SARS-CoV-2 by FDA under an Emergency Use Authorization (EUA). This EUA will remain  in effect  (meaning this test can be used) for the duration of the COVID-19 declaration under Section 56 4(b)(1) of the Act, 21 U.S.C. section 360bbb-3(b)(1), unless the authorization is terminated or revoked sooner. Performed at [facility], [place] [address], [place], [place]  [place] on Admission: DG Chest Portable 1 View  Result Date: 01/24/2020 CLINICAL DATA:  Dyspnea. EXAM: PORTABLE CHEST 1 VIEW COMPARISON:  July 18, 2018. FINDINGS: The heart size and mediastinal contours are within normal limits. Both lungs are clear. No pneumothorax or pleural effusion is noted. Right-sided ventriculoperitoneal shunt is again noted. Stable left-sided pacemaker. The visualized skeletal structures are unremarkable. IMPRESSION: No active disease. Electronically Signed   By: Marijo Conception M.D.   On: 01/24/2020 14:50    EKG: Independently reviewed.  Paced rhythm.  Assessment/Plan Principal Problem:   Acute CHF (congestive heart failure) (HCC) Active Problems:   Hypertension   CKD (chronic kidney disease) stage 3, GFR 30-59 ml/min   Essential hypertension   CAD- mild, non obstructive CAD 2008   Atrial flutter (HCC)   Acute systolic CHF (congestive heart failure) ([place])    1. Acute on chronic CHF last EF measured was in May 2020 showed an EF of 55 to 60%.  Usually takes torsemide at home.  Had received Lasix 60 mg IV and I have placed patient on Lasix 60 mg IV daily.  Will hold off patient's Entresto today because of patient's worsening renal function.  Continue Coreg. 2. Acute on chronic disease stage III will hold off Entresto for now.  Follow intake output Daily weights UA. 3. Hypertension evaluated continue with as needed IV hydralazine holding Entresto due to worsening renal function. 4. A. fib on Xarelto.  Also on Coreg. 5. Diabetes mellitus type 2 on NovoLog 70/30 I have decreased the dose since patient has worsening renal function. 6. History of sleep apnea on  CPAP. 7. Normal pressure hydrocephalus status post VP shunt placement. 8. Chronic anemia and thrombocytopenia follow CBC.  Given that patient presents with CHF with multiple comorbidities will need close monitoring for any further deterioration in inpatient status.   DVT prophylaxis: Xarelto. Code Status: Full code. Family Communication: Discussed with patient. Disposition Plan: Home. Consults called: Cardiology. Admission status: Inpatient.   Rise Patience MD [facility] Pager [phone].  If 7PM-7AM, please contact night-coverage [email or web address] Password [facility]  01/24/2020, 9:36 PM

## 2020-01-24 NOTE — ED Triage Notes (Signed)
Sob  x a while  He states worse over the last week worse with least exertion , just coming from dr office and sent here  No chest pain

## 2020-01-24 NOTE — Progress Notes (Signed)
Pt confused. Pulled IV, leads off.  Pt reports he forgot where he was.

## 2020-01-24 NOTE — ED Provider Notes (Signed)
[place] EMERGENCY DEPARTMENT Provider Note   CSN: [id number] Arrival date & time: 01/24/20  1326     History Chief Complaint  Patient presents with  . Shortness of Breath    Justin Hill is a 79 y.o. male.  HPI   79 year old male with dyspnea.  History supplemented by son at bedside.  Progressively worsening over the past month or so.  His cardiologist has been increasing his torsemide but symptoms have been persistent.  Increasing lower extremity edema.  Increasing dyspnea with exertion.  Denies any acute pain.  Reports compliance with all his medications.  Occasional nonproductive cough.  No fevers or chills. Pt says he typically weighs 250-260 lbs. Weight 280 lbs today.    Past Medical History:  Diagnosis Date  . Altered mental status   . Cardiomyopathy   . CHF (congestive heart failure) ([place])   . Chronic kidney disease (CKD), stage III (moderate)   . Communicating hydrocephalus ([place])   . Diabetes mellitus    type 2  . Diverticulosis   . History of kidney stones   . Hypercholesterolemia   . Hyperglycemia   . Hypertension   . Paroxysmal atrial fibrillation (H[facility])   . Personal history of kidney stones   . Pneumonia   . Sleep apnea    uses cpap   Patient Active Problem List   Diagnosis Date Noted  . ICD (implantable cardioverter-defibrillator) in place 05/25/2019  . Paroxysmal A-fib ([place]) 05/25/2019  . OSA (obstructive sleep apnea) 05/25/2019  . Lethargy 02/05/2017  . Hyperglycemia 02/05/2017  . Dehydration 02/05/2017  . Gastroenteritis 02/05/2017  . Acute encephalopathy 02/04/2017  . Hydrocephalus ([place])   . Chronic systolic dysfunction of left ventricle 06/05/2016  . Diabetes ([place]) 02/24/2016  . Chronic systolic CHF (congestive heart failure) ([place]) 05/18/2015  . Atrial flutter ([place]) 01/19/2015  . CAD- mild, non obstructive CAD 2008 01/16/2015  . Atrial flutter with rapid ventricular response ([place]) 01/16/2015  . Wide-complex  tachycardia ([place]) 01/06/2015  . Atrial fibrillation with rapid ventricular response ([place])   . Left bundle branch block   . Obesity (BMI 30-39.9)   . Essential hypertension   . LBBB (left bundle branch block) 12/26/2014  . Benign neoplasm of colon 12/29/2013  . CKD (chronic kidney disease) stage 3, GFR 30-59 ml/min 12/13/2012  . CHF (congestive heart failure) ([place])   . Hypercholesterolemia   . Hypertension   . Renal insufficiency 02/10/2012  . HYPERCHOLESTEROLEMIA 02/23/2009  . Nonischemic cardiomyopathy ([place]) 02/23/2009  . Congestive heart failure ([place]) 02/23/2009  . HYPERTENSION, HX OF 02/23/2009    Past Surgical History:  Procedure Laterality Date  . ATRIAL FLUTTER ABLATION N/A 01/19/2015   Procedure: ATRIAL FLUTTER ABLATION;  Surgeon: [name], MD;  Location: [facility] CATH LAB;  Service: Cardiovascular;  Laterality: N/A;  . CARDIAC CATHETERIZATION N/A 05/02/2016   Procedure: Right/Left Heart Cath and Coronary Angiography;  Surgeon: Larey Dresser, MD;  Location: [facility] CV LAB;  Service: Cardiovascular;  Laterality: N/A;  . COLONOSCOPY N/A 12/29/2013   Procedure: COLONOSCOPY;  Surgeon: Lafayette Dragon, MD;  Location: [facility] ENDOSCOPY;  Service: Endoscopy;  Laterality: N/A;  . EP IMPLANTABLE DEVICE N/A 06/05/2016   MDT Hillery Aldo XT CRTD implanted by Dr Rayann Heman   . EYE SURGERY     cataract surgery (not sure which eye)  . [place]   left. Muscle reattachment  . TOE SURGERY Right 12/2012   2nd toe  . TONSILLECTOMY    . VENTRICULOPERITONEAL  SHUNT Right 11/20/2017   Procedure: SHUNT PLACEMENT RIGHT;  Surgeon: Newman Pies, MD;  Location: [facility];  Service: Neurosurgery;  Laterality: Right;  SHUNT PLACEMENT RIGHT     Family History  Problem Relation Age of Onset  . Heart failure Maternal Grandmother   . Diabetes type II Sister 29  . Cancer Sister   . Hyperlipidemia Other        Parent  . Stroke Other        Parent  . Hypertension Other        Parent  . Diabetes  Other        Parent  . Colon cancer Neg Hx   . Heart attack Neg Hx     Social History   Tobacco Use  . Smoking status: Former Smoker    Types: Cigarettes  . Smokeless tobacco: Never Used  Substance Use Topics  . Alcohol use: No  . Drug use: No    Home Medications Prior to Admission medications   Medication Sig Start Date End Date Taking? Authorizing Provider  acetaminophen (TYLENOL) 500 MG tablet Take 500 mg by mouth every 6 (six) hours as needed.    [provider]  Ascorbic Acid (VITAMIN C) 1000 MG tablet Take 1 tablet by mouth 2 (two) times daily.     [provider]  carvedilol (COREG) 25 MG tablet TAKE 1 TABLET BY MOUTH TWICE DAILY WITH MEALS 10/31/19   Bensimhon, Shaune Pascal, MD  cholecalciferol (VITAMIN D3) 25 MCG (1000 UNIT) tablet Take 1,000 Units by mouth daily.    [provider]  Cinnamon 500 MG TABS Take 1 tablet (500 mg total) by mouth daily. 05/18/19   Darrick Grinder D, NP  fenofibrate 54 MG tablet Take 1 tablet by mouth once daily 11/29/19   Bensimhon, Shaune Pascal, MD  Fluticasone-Umeclidin-Vilant (TRELEGY ELLIPTA) 100-62.5-25 MCG/INH AEPB Inhale 1 puff into the lungs daily.    [provider]  insulin NPH-regular Human (70-30) 100 UNIT/ML injection 150 units with breakfast, and 30 units with supper, and syringes 2/day 01/18/20   Renato Shin, MD  loratadine (CLARITIN) 10 MG tablet Take 1 tablet by mouth 2 (two) times daily.     [provider]  Multiple Vitamin (MULTIVITAMIN) tablet Take 1 tablet by mouth daily.      [provider]  omeprazole (PRILOSEC) 40 MG capsule Take 40 mg by mouth daily. OTC    [provider]  Rivaroxaban (XARELTO) 15 MG TABS tablet Take 1 tablet (15 mg total) by mouth daily with supper. 10/14/19   Larey Dresser, MD  sacubitril-valsartan (ENTRESTO) 24-26 MG Take 1 tablet by mouth 2 (two) times daily. 06/14/19   Larey Dresser, MD  simvastatin (ZOCOR) 40 MG tablet Take 1 tablet (40 mg  total) by mouth at bedtime. 06/08/19   Bensimhon, Shaune Pascal, MD  tadalafil (CIALIS) 5 MG tablet Take 5 mg by mouth daily. 10/07/18   [provider]  tamsulosin (FLOMAX) 0.4 MG CAPS capsule Take 0.4 mg by mouth daily.    [provider]  torsemide (DEMADEX) 20 MG tablet Take 3 tablets (60 mg total) by mouth every morning AND 2 tablets (40 mg total) every evening. 01/17/20   Larey Dresser, MD    Allergies    Penicillins, Aspirin, and Lisinopril  Review of Systems   Review of Systems All systems reviewed and negative, other than as noted in HPI.  Physical Exam Updated Vital Signs BP (!) 116/51   Pulse 83  Temp 98 F (36.7 C) (Oral)   Resp (!) 22   Ht 5\' 11"  (1.803 m)   Wt 127 kg   SpO2 94%   BMI 39.05 kg/m   Physical Exam Vitals and nursing note reviewed.  Constitutional:      General: He is not in acute distress.    Appearance: He is well-developed.  HENT:     Head: Normocephalic and atraumatic.  Eyes:     General:        Right eye: No discharge.        Left eye: No discharge.     Conjunctiva/sclera: Conjunctivae normal.  Cardiovascular:     Rate and Rhythm: Normal rate and regular rhythm.     Heart sounds: Normal heart sounds. No murmur. No friction rub. No gallop.   Pulmonary:     Effort: Pulmonary effort is normal. No respiratory distress.     Breath sounds: Normal breath sounds.  Abdominal:     General: There is no distension.     Palpations: Abdomen is soft.     Tenderness: There is no abdominal tenderness.  Musculoskeletal:        General: No tenderness.     Cervical back: Neck supple.     Right lower leg: Edema present.     Left lower leg: Edema present.  Skin:    General: Skin is warm and dry.  Neurological:     Mental Status: He is alert.  Psychiatric:        Behavior: Behavior normal.        Thought Content: Thought content normal.     ED Results / Procedures / Treatments   Labs (all labs ordered are listed, but only  abnormal results are displayed) Labs Reviewed  CBC WITH DIFFERENTIAL/PLATELET - Abnormal; Notable for the following components:      Result Value   Hemoglobin 11.4 (*)    HCT 38.3 (*)    MCH 25.5 (*)    MCHC 29.8 (*)    RDW 23.6 (*)    Platelets 148 (*)    All other components within normal limits  BASIC METABOLIC PANEL - Abnormal; Notable for the following components:   Glucose, Bld 105 (*)    BUN 26 (*)    Creatinine, Ser 2.46 (*)    GFR calc non Af Amer 25 (*)    GFR calc Af Amer 28 (*)    All other components within normal limits  SARS CORONAVIRUS 2 (TAT 6-24 HRS)  BRAIN NATRIURETIC PEPTIDE    EKG EKG Interpretation  Date/Time:  Tuesday January 24 2020 13:27:55 EST Ventricular Rate:  84 PR Interval:  190 QRS Duration: 152 QT Interval:  412 QTC Calculation: 486 R Axis:   -80 Text Interpretation: Atrial-sensed ventricular-paced rhythm Abnormal ECG Confirmed by Virgel Manifold [phone]) on 01/24/2020 1:56:51 PM   Radiology DG Chest Portable 1 View  Result Date: 01/24/2020 CLINICAL DATA:  Dyspnea. EXAM: PORTABLE CHEST 1 VIEW COMPARISON:  July 18, 2018. FINDINGS: The heart size and mediastinal contours are within normal limits. Both lungs are clear. No pneumothorax or pleural effusion is noted. Right-sided ventriculoperitoneal shunt is again noted. Stable left-sided pacemaker. The visualized skeletal structures are unremarkable. IMPRESSION: No active disease. Electronically Signed   By: Marijo Conception M.D.   On: 01/24/2020 14:50    Procedures Procedures (including critical care time)  Medications Ordered in ED Medications - No data to display  ED Course  I have reviewed the triage vital signs  and the nursing notes.  Pertinent labs & imaging results that were available during my care of the patient were reviewed by me and considered in my medical decision making (see chart for details).    MDM Rules/Calculators/A&P                      78yM with progressive dyspnea  and weight gain. Demadex has been increased without improvement of symptoms. He seems like he may be a candidate for FUROSCIX trial. Team contacted. If not, would admit for IV diuresis.   Final Clinical Impression(s) / ED Diagnoses Final diagnoses:  Acute on chronic diastolic heart failure [facility])    Rx / DC Orders ED Discharge Orders    None       Virgel Manifold, MD 01/29/20 1100

## 2020-01-25 ENCOUNTER — Inpatient Hospital Stay (HOSPITAL_COMMUNITY): Payer: Medicare Other

## 2020-01-25 DIAGNOSIS — I483 Typical atrial flutter: Secondary | ICD-10-CM

## 2020-01-25 DIAGNOSIS — N1831 Chronic kidney disease, stage 3a: Secondary | ICD-10-CM

## 2020-01-25 DIAGNOSIS — Z9989 Dependence on other enabling machines and devices: Secondary | ICD-10-CM

## 2020-01-25 DIAGNOSIS — E118 Type 2 diabetes mellitus with unspecified complications: Secondary | ICD-10-CM | POA: Diagnosis present

## 2020-01-25 DIAGNOSIS — G4733 Obstructive sleep apnea (adult) (pediatric): Secondary | ICD-10-CM

## 2020-01-25 DIAGNOSIS — R0600 Dyspnea, unspecified: Secondary | ICD-10-CM

## 2020-01-25 DIAGNOSIS — I10 Essential (primary) hypertension: Secondary | ICD-10-CM

## 2020-01-25 DIAGNOSIS — I5033 Acute on chronic diastolic (congestive) heart failure: Secondary | ICD-10-CM

## 2020-01-25 DIAGNOSIS — I2510 Atherosclerotic heart disease of native coronary artery without angina pectoris: Secondary | ICD-10-CM

## 2020-01-25 DIAGNOSIS — I5021 Acute systolic (congestive) heart failure: Secondary | ICD-10-CM

## 2020-01-25 LAB — CBC WITH DIFFERENTIAL/PLATELET
Abs Immature Granulocytes: 0.01 10*3/uL (ref 0.00–0.07)
Basophils Absolute: 0 10*3/uL (ref 0.0–0.1)
Basophils Relative: 1 %
Eosinophils Absolute: 0.2 10*3/uL (ref 0.0–0.5)
Eosinophils Relative: 3 %
HCT: 39 % (ref 39.0–52.0)
Hemoglobin: 11.8 g/dL — ABNORMAL LOW (ref 13.0–17.0)
Immature Granulocytes: 0 %
Lymphocytes Relative: 15 %
Lymphs Abs: 1.2 10*3/uL (ref 0.7–4.0)
MCH: 25.4 pg — ABNORMAL LOW (ref 26.0–34.0)
MCHC: 30.3 g/dL (ref 30.0–36.0)
MCV: 83.9 fL (ref 80.0–100.0)
Monocytes Absolute: 0.7 10*3/uL (ref 0.1–1.0)
Monocytes Relative: 9 %
Neutro Abs: 5.6 10*3/uL (ref 1.7–7.7)
Neutrophils Relative %: 72 %
Platelets: UNDETERMINED 10*3/uL (ref 150–400)
RBC: 4.65 MIL/uL (ref 4.22–5.81)
RDW: 23.4 % — ABNORMAL HIGH (ref 11.5–15.5)
WBC: 7.8 10*3/uL (ref 4.0–10.5)
nRBC: 0 % (ref 0.0–0.2)

## 2020-01-25 LAB — COMPREHENSIVE METABOLIC PANEL
ALT: 15 U/L (ref 0–44)
AST: 19 U/L (ref 15–41)
Albumin: 3.3 g/dL — ABNORMAL LOW (ref 3.5–5.0)
Alkaline Phosphatase: 36 U/L — ABNORMAL LOW (ref 38–126)
Anion gap: 14 (ref 5–15)
BUN: 22 mg/dL (ref 8–23)
CO2: 32 mmol/L (ref 22–32)
Calcium: 9.6 mg/dL (ref 8.9–10.3)
Chloride: 98 mmol/L (ref 98–111)
Creatinine, Ser: 2.32 mg/dL — ABNORMAL HIGH (ref 0.61–1.24)
GFR calc Af Amer: 30 mL/min — ABNORMAL LOW (ref >60)
GFR calc non Af Amer: 26 mL/min — ABNORMAL LOW (ref >60)
Glucose, Bld: 103 mg/dL — ABNORMAL HIGH (ref 70–99)
Potassium: 4.1 mmol/L (ref 3.5–5.1)
Sodium: 144 mmol/L (ref 135–145)
Total Bilirubin: 1 mg/dL (ref 0.3–1.2)
Total Protein: 6.1 g/dL — ABNORMAL LOW (ref 6.5–8.1)

## 2020-01-25 LAB — ECHOCARDIOGRAM COMPLETE
Height: 71 in
Weight: 4307.2 oz

## 2020-01-25 LAB — MAGNESIUM: Magnesium: 2.3 mg/dL (ref 1.7–2.4)

## 2020-01-25 LAB — GLUCOSE, CAPILLARY
Glucose-Capillary: 101 mg/dL — ABNORMAL HIGH (ref 70–99)
Glucose-Capillary: 176 mg/dL — ABNORMAL HIGH (ref 70–99)
Glucose-Capillary: 103 mg/dL — ABNORMAL HIGH (ref 70–99)
Glucose-Capillary: 84 mg/dL (ref 70–99)

## 2020-01-25 LAB — TSH: TSH: 2.037 u[IU]/mL (ref 0.350–4.500)

## 2020-01-25 MED ORDER — SODIUM CHLORIDE 0.9 % IV SOLN: 250.0000 mL | INTRAVENOUS | Status: DC | PRN

## 2020-01-25 MED ORDER — SODIUM CHLORIDE 0.9% FLUSH: 3.0000 mL | INTRAVENOUS | Status: DC | PRN

## 2020-01-25 MED ORDER — ASPIRIN 81 MG PO CHEW
81.0000 mg | CHEWABLE_TABLET | ORAL | Status: AC
  Administered 2020-01-26: 81 mg via ORAL
  Filled 2020-01-25: qty 1

## 2020-01-25 MED ORDER — FUROSEMIDE 10 MG/ML IJ SOLN
60.0000 mg | Freq: Two times a day (BID) | INTRAMUSCULAR | Status: DC
  Administered 2020-01-25 (×2): 60 mg via INTRAVENOUS
  Filled 2020-01-25 (×2): qty 6

## 2020-01-25 MED ORDER — SODIUM CHLORIDE 0.9 % IV SOLN: INTRAVENOUS | Status: DC

## 2020-01-25 MED ORDER — SODIUM CHLORIDE 0.9% FLUSH
3.0000 mL | Freq: Two times a day (BID) | INTRAVENOUS | Status: DC
  Administered 2020-01-26 – 2020-02-01 (×7): 3 mL via INTRAVENOUS

## 2020-01-25 MED ORDER — FUROSEMIDE 10 MG/ML IJ SOLN: 60.0000 mg | Freq: Every day | INTRAMUSCULAR | Status: DC

## 2020-01-25 NOTE — Progress Notes (Signed)
2 unsuccessful IV attempts. Pt tolerated well.   IV team consulted at this time to start a new PIV.

## 2020-01-25 NOTE — Progress Notes (Signed)
Patient seemed a little confused when I started asking him questions about if he used a CPAP at home.  He finally stated that he did every night but did not seem interested in using one of ours while here at this hospital.  No distress was noted, will continue to monitor.

## 2020-01-25 NOTE — Consult Note (Addendum)
Cardiology Consultation:   Patient ID: Justin Hill MRN: [id number]; DOB: 1941/07/18  Admit date: 01/24/2020 Date of Consult: 01/25/2020  Primary Care Provider: Serita Grammes, MD Primary Cardiologist: Loralie Champagne, MD   Patient Profile:   Justin Hill is a 79 y.o. male with a hx of nonischemic cardiomyopathy with normalization of LV function, s/p Medtronic CRT-D, atrial flutter s/p ablation, OSA on CPAP, chronic kidney disease stage III, hyperlipidemia, diabetes, normal pressure hydrocephalus status post VP shunt, chronic left bundle branch block and hypertension who is being seen today for the evaluation of CHF at the request of Dr. Sherral Hammers.   Justin Hill was admitted in 2/16 with atrial flutter degenerating into atrial fibrillation.  He was initially cardioverted, then atrial flutter recurred.  He had atrial flutter ablation by Dr Rayann Heman in 2/16. Seems maintained sinus rhythm since then.  Anticoagulated with Xarelto.   LHC/RHC 04/2016: 30% mid LAD stenosis; mean RA 5, PA 33/10, PCWP mean 16, CI 1.95. Medtronic CRT-D device 05/2016.Most recent echocardiogram May 2020 showed LV function of 55 to 60%, normal LV size and systolic function.  Unable to tolerate ACE inhibitor secondary to hyperkalemia and AKI. Thus not on spironolactone.   Last seen by Dr. Aundra Dubin January 17, 2020.  Noted volume overload by exam and OptiVol interval. Increased torsemide to 60 qam/40 qpm. Weight was 282lb during office visit.   History of Present Illness:   Justin. Glantz sent from endocrinologist office to ER for persistent shortness of breath despite increasing diuretics.  Patient reports his edema and belly distention has improved but shortness of breath remains.  Weight has gone down few pounds.  Compliant with low-sodium diet and medications.  Some worsening dyspnea on exertion.  Use of 1-2 pillows at night.  His pain, palpitation, dizziness or syncope.  Has noted a rash on his feet bilaterally for the  past 1 to 2 months which has progressively worsened and spreading up.  Medications.  He got IV Lasix 60 mg in the ER yesterday and since bleeding has improved. Diurese 1 L. Weight 272>>269.   BNP 35.1 Hs-troponin 9>>9 Scr 2.46>>2.32 (Baseline around 1.8-2) Albumin 2.32 K 4.1 TSH 2.037 Chest Xray without active disease COVID negative  Encounter for ICM Monitoring 01/24/20 Clinical Status (17-Jan-2020 to 24-Jan-2020)   AT/AF               2 episodes   Time in AT/AF              1.2 hr/day (5.1%)  Longest AT/AF             4 hours   Optivol thoracic impedancereturned to baseline   Heart Pathway Score:     Past Medical History:  Diagnosis Date  . Altered mental status   . Cardiomyopathy   . CHF (congestive heart failure) ([place])   . Chronic kidney disease (CKD), stage III (moderate)   . Communicating hydrocephalus ([place])   . Diabetes mellitus    type 2  . Diverticulosis   . History of kidney stones   . Hypercholesterolemia   . Hyperglycemia   . Hypertension   . Paroxysmal atrial fibrillation ([facility])   . Personal history of kidney stones   . Pneumonia   . Sleep apnea    uses cpap    Past Surgical History:  Procedure Laterality Date  . ATRIAL FLUTTER ABLATION N/A 01/19/2015   Procedure: ATRIAL FLUTTER ABLATION;  Surgeon: [name], MD;  Location: [facility] CATH LAB;  Service: Cardiovascular;  Laterality: N/A;  . CARDIAC CATHETERIZATION N/A 05/02/2016   Procedure: Right/Left Heart Cath and Coronary Angiography;  Surgeon: Larey Dresser, MD;  Location: [facility] CV LAB;  Service: Cardiovascular;  Laterality: N/A;  . COLONOSCOPY N/A 12/29/2013   Procedure: COLONOSCOPY;  Surgeon: Lafayette Dragon, MD;  Location: [facility] ENDOSCOPY;  Service: Endoscopy;  Laterality: N/A;  . EP IMPLANTABLE DEVICE N/A 06/05/2016   MDT Hillery Aldo XT CRTD implanted by Dr Rayann Heman   . EYE SURGERY     cataract surgery (not sure which eye)  . [place]   left. Muscle reattachment  . TOE SURGERY Right  12/2012   2nd toe  . TONSILLECTOMY    . VENTRICULOPERITONEAL SHUNT Right 11/20/2017   Procedure: SHUNT PLACEMENT RIGHT;  Surgeon: Newman Pies, MD;  Location: [facility];  Service: Neurosurgery;  Laterality: Right;  SHUNT PLACEMENT RIGHT     Inpatient Medications: Scheduled Meds: . vitamin C  1,000 mg Oral BID  . carvedilol  25 mg Oral BID WC  . cholecalciferol  1,000 Units Oral Daily  . fenofibrate  54 mg Oral Daily  . fluticasone furoate-vilanterol  1 puff Inhalation Daily  . furosemide  60 mg Intravenous Daily  . insulin aspart  0-6 Units Subcutaneous TID WC  . insulin aspart protamine- aspart  125 Units Subcutaneous Q breakfast  . insulin aspart protamine- aspart  15 Units Subcutaneous Q supper  . loratadine  10 mg Oral BID  . multivitamin with minerals  1 tablet Oral Daily  . pantoprazole  40 mg Oral Daily  . Rivaroxaban  15 mg Oral Q supper  . simvastatin  40 mg Oral QHS  . tamsulosin  0.4 mg Oral Daily  . umeclidinium bromide  1 puff Inhalation Daily   Continuous Infusions:  PRN Meds: acetaminophen **OR** acetaminophen, ondansetron **OR** ondansetron (ZOFRAN) IV  Allergies:    Allergies  Allergen Reactions  . Penicillins Anaphylaxis and Other (See Comments)    SYNCOPE PATIENT HAS HAD A PCN REACTION WITH IMMEDIATE RASH, FACIAL/TONGUE/THROAT SWELLING, SOB, OR LIGHTHEADEDNESS WITH HYPOTENSION:  #  #  #  YES  #  #  #   Has patient had a PCN reaction causing severe rash involving mucus membranes or skin necrosis: no PATIENT HAS HAD A PCN REACTION THAT REQUIRED HOSPITALIZATION:  #  #  #  YES  #  #  #  Has patient had a PCN reaction occurring within the last 10 years: no    . Aspirin Other (See Comments)    Burps, burning, stomach pains, etc  . Lisinopril Other (See Comments) and Cough    Severe coughing    Social History:   Social History   Socioeconomic History  . Marital status: Married    Spouse name: Rosemarie Beath  . Number of children: 1  . Years of education:  29  . Highest education level: Not on file  Occupational History  . Occupation: retired  Tobacco Use  . Smoking status: Former Smoker    Types: Cigarettes  . Smokeless tobacco: Never Used  Substance and Sexual Activity  . Alcohol use: No  . Drug use: No  . Sexual activity: Not on file  Other Topics Concern  . Not on file  Social History Narrative   Regular exercise-no   Rare caffeine use    Social Determinants of Health   Financial Resource Strain:   . Difficulty of Paying Living Expenses: Not on file  Food Insecurity:   . Worried  About Running Out of Food in the Last Year: Not on file  . Ran Out of Food in the Last Year: Not on file  Transportation Needs:   . Lack of Transportation (Medical): Not on file  . Lack of Transportation (Non-Medical): Not on file  Physical Activity:   . Days of Exercise per Week: Not on file  . Minutes of Exercise per Session: Not on file  Stress:   . Feeling of Stress : Not on file  Social Connections:   . Frequency of Communication with Friends and Family: Not on file  . Frequency of Social Gatherings with Friends and Family: Not on file  . Attends Religious Services: Not on file  . Active Member of Clubs or Organizations: Not on file  . Attends Archivist Meetings: Not on file  . Marital Status: Not on file  Intimate Partner Violence:   . Fear of Current or Ex-Partner: Not on file  . Emotionally Abused: Not on file  . Physically Abused: Not on file  . Sexually Abused: Not on file    Family History:   Family History  Problem Relation Age of Onset  . Heart failure Maternal Grandmother   . Diabetes type II Sister 6  . Cancer Sister   . Hyperlipidemia Other        Parent  . Stroke Other        Parent  . Hypertension Other        Parent  . Diabetes Other        Parent  . Colon cancer Neg Hx   . Heart attack Neg Hx      ROS:  Please see the history of present illness.  All other ROS reviewed and negative.      Physical Exam/Data:   Vitals:   01/25/20 0619 01/25/20 0804 01/25/20 0805 01/25/20 0844  BP: (!) 99/49   (!) 116/50  Pulse: 92   87  Resp: 18   20  Temp: 98.3 F (36.8 C)   98 F (36.7 C)  TempSrc: Oral   Oral  SpO2: 100% 93% 98% 92%  Weight: 122.1 kg     Height:        Intake/Output Summary (Last 24 hours) at 01/25/2020 0913 Last data filed at 01/25/2020 0700 Gross per 24 hour  Intake 480 ml  Output 1550 ml  Net -1070 ml   Last 3 Weights 01/25/2020 01/24/2020 01/24/2020  Weight (lbs) 269 lb 3.2 oz 272 lb 4.8 oz 280 lb  Weight (kg) 122.108 kg 123.514 kg 127.007 kg     Body mass index is 37.55 kg/m.  General:  Well nourished, well developed, in no acute distress HEENT: normal Lymph: no adenopathy Neck: JVD difficult to assess due to beard Endocrine:  No thryomegaly Vascular: No carotid bruits; FA pulses 2+ bilaterally without bruits  Cardiac:  normal S1, S2; RRR; no murmur  Lungs:  clear to auscultation bilaterally, no wheezing, rhonchi or rales  Abd: soft, nontender, no hepatomegaly  Ext: no edema Musculoskeletal:  No deformities, BUE and BLE strength normal and equal Skin: warm, scarring with dryness Neuro:  CNs 2-12 intact, no focal abnormalities noted Psych:  Normal affect   EKG:  The EKG was personally reviewed and demonstrates:  AV paced rhythm Telemetry:  Telemetry was personally reviewed and demonstrates: Paced rhythm  Relevant CV Studies:  Echo 03/2019 1. The left ventricle has normal systolic function, with an ejection  fraction of 55-60%. The cavity size was normal.  Left ventricular diastolic  Doppler parameters are consistent with pseudonormalization.  2. The right ventricle has normal systolic function. The cavity was  normal. There is no increase in right ventricular wall thickness.  3. Right atrial size was mildly dilated.  4. Moderate aortic annular calcification noted.  5. The interatrial septum was not assessed.   Laboratory Data:  High  Sensitivity Troponin:   Recent Labs  Lab 01/24/20 1714 01/24/20 1956  TROPONINIHS 9 9     Chemistry Recent Labs  Lab 01/24/20 1414 01/25/20 0521  NA 141 144  K 3.9 4.1  CL 99 98  CO2 32 32  GLUCOSE 105* 103*  BUN 26* 22  CREATININE 2.46* 2.32*  CALCIUM 9.3 9.6  GFRNONAA 25* 26*  GFRAA 28* 30*  ANIONGAP 10 14    Recent Labs  Lab 01/25/20 0521  PROT 6.1*  ALBUMIN 3.3*  AST 19  ALT 15  ALKPHOS 36*  BILITOT 1.0   Hematology Recent Labs  Lab 01/24/20 1414 01/25/20 0521  WBC 5.7 7.8  RBC 4.47 4.65  HGB 11.4* 11.8*  HCT 38.3* 39.0  MCV 85.7 83.9  MCH 25.5* 25.4*  MCHC 29.8* 30.3  RDW 23.6* 23.4*  PLT 148* PLATELET CLUMPS NOTED ON SMEAR, UNABLE TO ESTIMATE   BNP Recent Labs  Lab 01/24/20 1414  BNP 35.1     Radiology/Studies:  DG Chest Portable 1 View  Result Date: 01/24/2020 CLINICAL DATA:  Dyspnea. EXAM: PORTABLE CHEST 1 VIEW COMPARISON:  July 18, 2018. FINDINGS: The heart size and mediastinal contours are within normal limits. Both lungs are clear. No pneumothorax or pleural effusion is noted. Right-sided ventriculoperitoneal shunt is again noted. Stable left-sided pacemaker. The visualized skeletal structures are unremarkable. IMPRESSION: No active disease. Electronically Signed   By: Marijo Conception M.D.   On: 01/24/2020 14:50   {  Assessment and Plan:   1. Non-Ischemic cardiomyopathy - LHC/RHC 04/2016: 30% mid LAD stenosis; mean RA 5, PA 33/10, PCWP mean 16, CI 1.95 - Echocardiogram May 2020 showed normalization of LV function of 55 to 60% with grade 2 diastolic dysfunction.  Normal RV function and cavity. -His torsemide increased last week for volume overload.  His weight, abdominal distention and edema has improved but shortness of breath remains same. - ICM monitoring showed Optivol thoracic impedancereturned to baseline. BNP normal. CXR without pulmonary edema. He got IV lasix 60mg  x1 so far with 1L diuresis and 3 lb weight loss (273>>269).  Weight was 282 during office visit.  -His dyspnea is out of proportion from findings but improved since got IV Lasix. -Agree with repeat echocardiogram and look for LV/RV function. If no improvement may need RHC -Continue IV Lasix 20 mg twice daily.  Strict INO and daily weight -Continue Coreg 25 mg twice daily -Entresto on hold secondary to worsening renal function -No spironolactone given history of hyperkalemia  2.  Acute on chronic kidney disease stage III -Scr 2.46>>2.32 (Baseline around 1.8-2) - Follow with diuresis  3.  Atrial flutter/fibrillation s/p ablation -On Xarelto for anticoagulation.  Denies bleeding issue or palpitations. - ICM Monitoring 01/24/20 Clinical Status (17-Jan-2020 to 24-Jan-2020)   AT/AF               2 episodes   Time in AT/AF              1.2 hr/day (5.1%) Longest AT/AF             4 hours  - Unaware of any afib episode  4.  Hyperlipidemia -Continue statin  5.  Obstructive sleep apnea -On CPAP  6.  Diabetes mellitus -Per primary team  7.  Skin changes -Patient reports dry scarring of skin on all extremities - may be spreading up - per primary team  8. Chronic anemia and thrombocytopenia - stable   Dr. Haroldine Laws to see later today.   For questions or updates, please contact [facility] [place] Please consult [email or web address] for contact info under    Jarrett Soho, PA  01/25/2020 9:13 AM   Patient seen and examined with the above-signed Advanced Practice Provider and/or Housestaff. I personally reviewed laboratory data, imaging studies and relevant notes. I independently examined the patient and formulated the important aspects of the plan. I have edited the note to reflect any of my changes or salient points. I have personally discussed the plan with the patient and/or family.  79 y/o male with morbid obesity, OSA, HF with recovered ejection fraction, PAF and dementia.   Recently seen in clinic by Dr. Aundra Dubin and was volume  overloaded. Torsemide adjusted. Has diuresed well.   In ER notes it says he presented to ER today for increasing SOB. However when I ask him why he is here he says he doesn't know. Says breathing is fine. Denies orthopnea or PND. Weight and Optivol back to baseline. CXR clear. BNP 35  Echo today reviewed personally EF 55-60% RV ok   Exam General:  Morbidly obese male lying flat in bed. No dyspnea  HEENT: normal Neck: supple. Hard ti see JVP but appears flat. Carotids 2+ bilat; no bruits. No lymphadenopathy or thryomegaly appreciated. Cor: PMI nondisplaced. Regular rate & rhythm. No rubs, gallops or murmurs. Lungs: clear Abdomen: markedly obese.  nontender, nondistended. No hepatosplenomegaly. No bruits or masses. Good bowel sounds. Extremities: no cyanosis, clubbing, rash, edema Neuro: alert & oriented cranial nerves grossly intact. moves all 4 extremities w/o difficulty. Affect pleasant  He had recent HF flare but currently seems back to baseline on exam, CXR and Optivol. Echo normal. Can give one more dose of IV lasix to see if we can get him just a bit drier but likely can go home tomorrow. Suspect obesity is contributing to symptoms. If symptoms persist can consider RHC.   Glori Bickers, MD  5:34 PM

## 2020-01-25 NOTE — Progress Notes (Signed)
  Echocardiogram 2D Echocardiogram has been performed.  Justin Hill 01/25/2020, 10:42 AM

## 2020-01-25 NOTE — H&P (View-Only) (Signed)
Cardiology Consultation:   Patient ID: Justin Hill MRN: [id number]; DOB: 07/10/1941  Admit date: 01/24/2020 Date of Consult: 01/25/2020  Primary Care Provider: Serita Grammes, MD Primary Cardiologist: Loralie Champagne, MD   Patient Profile:   Justin Hill is a 79 y.o. male with a hx of nonischemic cardiomyopathy with normalization of LV function, s/p Medtronic CRT-D, atrial flutter s/p ablation, OSA on CPAP, chronic kidney disease stage III, hyperlipidemia, diabetes, normal pressure hydrocephalus status post VP shunt, chronic left bundle branch block and hypertension who is being seen today for the evaluation of CHF at the request of Dr. Sherral Hammers.   Justin Hill was admitted in 2/16 with atrial flutter degenerating into atrial fibrillation.  He was initially cardioverted, then atrial flutter recurred.  He had atrial flutter ablation by Dr Rayann Heman in 2/16. Seems maintained sinus rhythm since then.  Anticoagulated with Xarelto.   LHC/RHC 04/2016: 30% mid LAD stenosis; mean RA 5, PA 33/10, PCWP mean 16, CI 1.95. Medtronic CRT-D device 05/2016.Most recent echocardiogram May 2020 showed LV function of 55 to 60%, normal LV size and systolic function.  Unable to tolerate ACE inhibitor secondary to hyperkalemia and AKI. Thus not on spironolactone.   Last seen by Dr. Aundra Dubin January 17, 2020.  Noted volume overload by exam and OptiVol interval. Increased torsemide to 60 qam/40 qpm. Weight was 282lb during office visit.   History of Present Illness:   Justin Hill sent from endocrinologist office to ER for persistent shortness of breath despite increasing diuretics.  Patient reports his edema and belly distention has improved but shortness of breath remains.  Weight has gone down few pounds.  Compliant with low-sodium diet and medications.  Some worsening dyspnea on exertion.  Use of 1-2 pillows at night.  His pain, palpitation, dizziness or syncope.  Has noted a rash on his feet bilaterally for the  past 1 to 2 months which has progressively worsened and spreading up.  Medications.  He got IV Lasix 60 mg in the ER yesterday and since bleeding has improved. Diurese 1 L. Weight 272>>269.   BNP 35.1 Hs-troponin 9>>9 Scr 2.46>>2.32 (Baseline around 1.8-2) Albumin 2.32 K 4.1 TSH 2.037 Chest Xray without active disease COVID negative  Encounter for ICM Monitoring 01/24/20 Clinical Status (17-Jan-2020 to 24-Jan-2020)   AT/AF               2 episodes   Time in AT/AF              1.2 hr/day (5.1%)  Longest AT/AF             4 hours   Optivol thoracic impedancereturned to baseline   Heart Pathway Score:     Past Medical History:  Diagnosis Date  . Altered mental status   . Cardiomyopathy   . CHF (congestive heart failure) ([place])   . Chronic kidney disease (CKD), stage III (moderate)   . Communicating hydrocephalus ([place])   . Diabetes mellitus    type 2  . Diverticulosis   . History of kidney stones   . Hypercholesterolemia   . Hyperglycemia   . Hypertension   . Paroxysmal atrial fibrillation ([facility])   . Personal history of kidney stones   . Pneumonia   . Sleep apnea    uses cpap    Past Surgical History:  Procedure Laterality Date  . ATRIAL FLUTTER ABLATION N/A 01/19/2015   Procedure: ATRIAL FLUTTER ABLATION;  Surgeon: [name], MD;  Location: [facility] CATH LAB;  Service: Cardiovascular;  Laterality: N/A;  . CARDIAC CATHETERIZATION N/A 05/02/2016   Procedure: Right/Left Heart Cath and Coronary Angiography;  Surgeon: Larey Dresser, MD;  Location: [place] CV LAB;  Service: Cardiovascular;  Laterality: N/A;  . COLONOSCOPY N/A 12/29/2013   Procedure: COLONOSCOPY;  Surgeon: Lafayette Dragon, MD;  Location: [facility] ENDOSCOPY;  Service: Endoscopy;  Laterality: N/A;  . EP IMPLANTABLE DEVICE N/A 06/05/2016   MDT Hillery Aldo XT CRTD implanted by Dr Rayann Heman   . EYE SURGERY     cataract surgery (not sure which eye)  . [place]   left. Muscle reattachment  . TOE SURGERY Right  12/2012   2nd toe  . TONSILLECTOMY    . VENTRICULOPERITONEAL SHUNT Right 11/20/2017   Procedure: SHUNT PLACEMENT RIGHT;  Surgeon: Newman Pies, MD;  Location: [place];  Service: Neurosurgery;  Laterality: Right;  SHUNT PLACEMENT RIGHT     Inpatient Medications: Scheduled Meds: . vitamin C  1,000 mg Oral BID  . carvedilol  25 mg Oral BID WC  . cholecalciferol  1,000 Units Oral Daily  . fenofibrate  54 mg Oral Daily  . fluticasone furoate-vilanterol  1 puff Inhalation Daily  . furosemide  60 mg Intravenous Daily  . insulin aspart  0-6 Units Subcutaneous TID WC  . insulin aspart protamine- aspart  125 Units Subcutaneous Q breakfast  . insulin aspart protamine- aspart  15 Units Subcutaneous Q supper  . loratadine  10 mg Oral BID  . multivitamin with minerals  1 tablet Oral Daily  . pantoprazole  40 mg Oral Daily  . Rivaroxaban  15 mg Oral Q supper  . simvastatin  40 mg Oral QHS  . tamsulosin  0.4 mg Oral Daily  . umeclidinium bromide  1 puff Inhalation Daily   Continuous Infusions:  PRN Meds: acetaminophen **OR** acetaminophen, ondansetron **OR** ondansetron (ZOFRAN) IV  Allergies:    Allergies  Allergen Reactions  . Penicillins Anaphylaxis and Other (See Comments)    SYNCOPE PATIENT HAS HAD A PCN REACTION WITH IMMEDIATE RASH, FACIAL/TONGUE/THROAT SWELLING, SOB, OR LIGHTHEADEDNESS WITH HYPOTENSION:  #  #  #  YES  #  #  #   Has patient had a PCN reaction causing severe rash involving mucus membranes or skin necrosis: no PATIENT HAS HAD A PCN REACTION THAT REQUIRED HOSPITALIZATION:  #  #  #  YES  #  #  #  Has patient had a PCN reaction occurring within the last 10 years: no    . Aspirin Other (See Comments)    Burps, burning, stomach pains, etc  . Lisinopril Other (See Comments) and Cough    Severe coughing    Social History:   Social History   Socioeconomic History  . Marital status: Married    Spouse name: Rosemarie Beath  . Number of children: 1  . Years of education:  14  . Highest education level: Not on file  Occupational History  . Occupation: retired  Tobacco Use  . Smoking status: Former Smoker    Types: Cigarettes  . Smokeless tobacco: Never Used  Substance and Sexual Activity  . Alcohol use: No  . Drug use: No  . Sexual activity: Not on file  Other Topics Concern  . Not on file  Social History Narrative   Regular exercise-no   Rare caffeine use    Social Determinants of Health   Financial Resource Strain:   . Difficulty of Paying Living Expenses: Not on file  Food Insecurity:   . Worried  About Running Out of Food in the Last Year: Not on file  . Ran Out of Food in the Last Year: Not on file  Transportation Needs:   . Lack of Transportation (Medical): Not on file  . Lack of Transportation (Non-Medical): Not on file  Physical Activity:   . Days of Exercise per Week: Not on file  . Minutes of Exercise per Session: Not on file  Stress:   . Feeling of Stress : Not on file  Social Connections:   . Frequency of Communication with Friends and Family: Not on file  . Frequency of Social Gatherings with Friends and Family: Not on file  . Attends Religious Services: Not on file  . Active Member of Clubs or Organizations: Not on file  . Attends Archivist Meetings: Not on file  . Marital Status: Not on file  Intimate Partner Violence:   . Fear of Current or Ex-Partner: Not on file  . Emotionally Abused: Not on file  . Physically Abused: Not on file  . Sexually Abused: Not on file    Family History:   Family History  Problem Relation Age of Onset  . Heart failure Maternal Grandmother   . Diabetes type II Sister 17  . Cancer Sister   . Hyperlipidemia Other        Parent  . Stroke Other        Parent  . Hypertension Other        Parent  . Diabetes Other        Parent  . Colon cancer Neg Hx   . Heart attack Neg Hx      ROS:  Please see the history of present illness.  All other ROS reviewed and negative.      Physical Exam/Data:   Vitals:   01/25/20 0619 01/25/20 0804 01/25/20 0805 01/25/20 0844  BP: (!) 99/49   (!) 116/50  Pulse: 92   87  Resp: 18   20  Temp: 98.3 F (36.8 C)   98 F (36.7 C)  TempSrc: Oral   Oral  SpO2: 100% 93% 98% 92%  Weight: 122.1 kg     Height:        Intake/Output Summary (Last 24 hours) at 01/25/2020 0913 Last data filed at 01/25/2020 0700 Gross per 24 hour  Intake 480 ml  Output 1550 ml  Net -1070 ml   Last 3 Weights 01/25/2020 01/24/2020 01/24/2020  Weight (lbs) 269 lb 3.2 oz 272 lb 4.8 oz 280 lb  Weight (kg) 122.108 kg 123.514 kg 127.007 kg     Body mass index is 37.55 kg/m.  General:  Well nourished, well developed, in no acute distress HEENT: normal Lymph: no adenopathy Neck: JVD difficult to assess due to beard Endocrine:  No thryomegaly Vascular: No carotid bruits; FA pulses 2+ bilaterally without bruits  Cardiac:  normal S1, S2; RRR; no murmur  Lungs:  clear to auscultation bilaterally, no wheezing, rhonchi or rales  Abd: soft, nontender, no hepatomegaly  Ext: no edema Musculoskeletal:  No deformities, BUE and BLE strength normal and equal Skin: warm, scarring with dryness Neuro:  CNs 2-12 intact, no focal abnormalities noted Psych:  Normal affect   EKG:  The EKG was personally reviewed and demonstrates:  AV paced rhythm Telemetry:  Telemetry was personally reviewed and demonstrates: Paced rhythm  Relevant CV Studies:  Echo 03/2019 1. The left ventricle has normal systolic function, with an ejection  fraction of 55-60%. The cavity size was normal.  Left ventricular diastolic  Doppler parameters are consistent with pseudonormalization.  2. The right ventricle has normal systolic function. The cavity was  normal. There is no increase in right ventricular wall thickness.  3. Right atrial size was mildly dilated.  4. Moderate aortic annular calcification noted.  5. The interatrial septum was not assessed.   Laboratory Data:  High  Sensitivity Troponin:   Recent Labs  Lab 01/24/20 1714 01/24/20 1956  TROPONINIHS 9 9     Chemistry Recent Labs  Lab 01/24/20 1414 01/25/20 0521  NA 141 144  K 3.9 4.1  CL 99 98  CO2 32 32  GLUCOSE 105* 103*  BUN 26* 22  CREATININE 2.46* 2.32*  CALCIUM 9.3 9.6  GFRNONAA 25* 26*  GFRAA 28* 30*  ANIONGAP 10 14    Recent Labs  Lab 01/25/20 0521  PROT 6.1*  ALBUMIN 3.3*  AST 19  ALT 15  ALKPHOS 36*  BILITOT 1.0   Hematology Recent Labs  Lab 01/24/20 1414 01/25/20 0521  WBC 5.7 7.8  RBC 4.47 4.65  HGB 11.4* 11.8*  HCT 38.3* 39.0  MCV 85.7 83.9  MCH 25.5* 25.4*  MCHC 29.8* 30.3  RDW 23.6* 23.4*  PLT 148* PLATELET CLUMPS NOTED ON SMEAR, UNABLE TO ESTIMATE   BNP Recent Labs  Lab 01/24/20 1414  BNP 35.1     Radiology/Studies:  DG Chest Portable 1 View  Result Date: 01/24/2020 CLINICAL DATA:  Dyspnea. EXAM: PORTABLE CHEST 1 VIEW COMPARISON:  July 18, 2018. FINDINGS: The heart size and mediastinal contours are within normal limits. Both lungs are clear. No pneumothorax or pleural effusion is noted. Right-sided ventriculoperitoneal shunt is again noted. Stable left-sided pacemaker. The visualized skeletal structures are unremarkable. IMPRESSION: No active disease. Electronically Signed   By: Marijo Conception M.D.   On: 01/24/2020 14:50   {  Assessment and Plan:   1. Non-Ischemic cardiomyopathy - LHC/RHC 04/2016: 30% mid LAD stenosis; mean RA 5, PA 33/10, PCWP mean 16, CI 1.95 - Echocardiogram May 2020 showed normalization of LV function of 55 to 60% with grade 2 diastolic dysfunction.  Normal RV function and cavity. -His torsemide increased last week for volume overload.  His weight, abdominal distention and edema has improved but shortness of breath remains same. - ICM monitoring showed Optivol thoracic impedancereturned to baseline. BNP normal. CXR without pulmonary edema. He got IV lasix 60mg  x1 so far with 1L diuresis and 3 lb weight loss (273>>269).  Weight was 282 during office visit.  -His dyspnea is out of proportion from findings but improved since got IV Lasix. -Agree with repeat echocardiogram and look for LV/RV function. If no improvement may need RHC -Continue IV Lasix 20 mg twice daily.  Strict INO and daily weight -Continue Coreg 25 mg twice daily -Entresto on hold secondary to worsening renal function -No spironolactone given history of hyperkalemia  2.  Acute on chronic kidney disease stage III -Scr 2.46>>2.32 (Baseline around 1.8-2) - Follow with diuresis  3.  Atrial flutter/fibrillation s/p ablation -On Xarelto for anticoagulation.  Denies bleeding issue or palpitations. - ICM Monitoring 01/24/20 Clinical Status (17-Jan-2020 to 24-Jan-2020)   AT/AF               2 episodes   Time in AT/AF              1.2 hr/day (5.1%) Longest AT/AF             4 hours  - Unaware of any afib episode  4.  Hyperlipidemia -Continue statin  5.  Obstructive sleep apnea -On CPAP  6.  Diabetes mellitus -Per primary team  7.  Skin changes -Patient reports dry scarring of skin on all extremities - may be spreading up - per primary team  8. Chronic anemia and thrombocytopenia - stable   Dr. Haroldine Laws to see later today.   For questions or updates, please contact [place] Please consult [email or web address] for contact info under    Jarrett Soho, PA  01/25/2020 9:13 AM   Patient seen and examined with the above-signed Advanced Practice Provider and/or Housestaff. I personally reviewed laboratory data, imaging studies and relevant notes. I independently examined the patient and formulated the important aspects of the plan. I have edited the note to reflect any of my changes or salient points. I have personally discussed the plan with the patient and/or family.  79 y/o male with morbid obesity, OSA, HF with recovered ejection fraction, PAF and dementia.   Recently seen in clinic by Dr. Aundra Dubin and was volume  overloaded. Torsemide adjusted. Has diuresed well.   In ER notes it says he presented to ER today for increasing SOB. However when I ask him why he is here he says he doesn't know. Says breathing is fine. Denies orthopnea or PND. Weight and Optivol back to baseline. CXR clear. BNP 35  Echo today reviewed personally EF 55-60% RV ok   Exam General:  Morbidly obese male lying flat in bed. No dyspnea  HEENT: normal Neck: supple. Hard ti see JVP but appears flat. Carotids 2+ bilat; no bruits. No lymphadenopathy or thryomegaly appreciated. Cor: PMI nondisplaced. Regular rate & rhythm. No rubs, gallops or murmurs. Lungs: clear Abdomen: markedly obese.  nontender, nondistended. No hepatosplenomegaly. No bruits or masses. Good bowel sounds. Extremities: no cyanosis, clubbing, rash, edema Neuro: alert & oriented cranial nerves grossly intact. moves all 4 extremities w/o difficulty. Affect pleasant  He had recent HF flare but currently seems back to baseline on exam, CXR and Optivol. Echo normal. Can give one more dose of IV lasix to see if we can get him just a bit drier but likely can go home tomorrow. Suspect obesity is contributing to symptoms. If symptoms persist can consider RHC.   Glori Bickers, MD  5:34 PM

## 2020-01-25 NOTE — Progress Notes (Signed)
IV pulled out during night shift.  Lasix on hold until pt gets new IV access. No attempt was made during night shift

## 2020-01-25 NOTE — Progress Notes (Signed)
Inpatient Diabetes Program Recommendations  AACE/ADA: New Consensus Statement on Inpatient Glycemic Control (2015)  Target Ranges:  Prepandial:   less than 140 mg/dL      Peak postprandial:   less than 180 mg/dL (1-2 hours)      Critically ill patients:  140 - 180 mg/dL   Lab Results  Component Value Date   GLUCAP 176 (H) 01/25/2020   HGBA1C 6.4 (A) 01/18/2020    Review of Glycemic Control Results for BROLY, HATFIELD" (MRN [id number]) as of 01/25/2020 12:43  Ref. Range 01/24/2020 17:36 01/24/2020 18:30 01/24/2020 22:18 01/25/2020 06:17 01/25/2020 12:03  Glucose-Capillary Latest Ref Range: 70 - 99 mg/dL 43 (LL) 77 169 (H) 101 (H) 176 (H)   Diabetes history: Type 2 DM Outpatient Diabetes medications: Novolog 70/30 150 units QAM, 30 units QPM Current orders for Inpatient glycemic control: Novolog 70/30 125 units QAM, 15 units QPM  Inpatient Diabetes Program Recommendations:    Patient is followed by Dr Loanne Drilling outpatient endocrinology, with last appointment on 2/24. At this visit, patient was having lows and thus, doses were decreased; A1C 6.4%.  On admission, noted hypoglycemia of 43 mg/dL. Already administered 70/30 125 units this AM, will watch glucose trends.   Spoke with patient regarding outpatient diabetes management. Reports having a meter and needed supplies, but his wife helps to manage injections and checking blood sugars.  Patient denies having multiple episodes of hypoglycemia at home. However, states, "I often sweat a lot and drinks orange juice to make it better." Patient reports that "this doesn't happen often" but is also saying things that are somewhat inconsistent and appears confused. Permission granted to also call wife.  Attempted calling wife x 2, left message.   Thanks, Bronson Curb, MSN, RNC-OB Diabetes Coordinator [phone] (8a-5p)

## 2020-01-25 NOTE — Progress Notes (Addendum)
PROGRESS NOTE    Justin Hill  IRS:[id number] DOB: 11/19/1941 DOA: 01/24/2020 PCP: Serita Grammes, MD     Brief Narrative:  Justin Hill is a 79 y.o.WM PMHx chronic systolic CHF last EF measured was 55 to 60% on May 2020 (resolved),s/p ablation of atrial flutter Hx of A. fib on Xarelto, DM type II controlled with complication,  normal pressure hydrocephalus s/p VP shunt, CKD stage IIIb ( baseline Cr 1.8 ), OSA on CPAP was referred from the primary care office after patient has become increasingly short of breath.  Patient states over the last 1 week patient has become increasingly short of breath denies any chest pain productive cough fever chills.  Denies any nausea vomiting or diarrhea.  ED Course: In the ER chest x-ray does not show anything acute on exam patient has bilateral lower extremity edema elevated JVD BNP was 35.1 high-sensitivity troponin was 9.  CBC shows anemia and thrombocytopenia which is chronic.  Creatinine is increased to 2.1 with baseline around 1.8-1.9.  Patient was given Lasix 60 mg IV and admitted for acute CHF.  Covid test was negative.   Subjective: A/O x4, negative CP, negative S OB.  States base weight 250 pounds (113.3 kg).  States weighs himself daily.  Unsure of how he gained so much weight.   Assessment & Plan:   Principal Problem:   Acute CHF (congestive heart failure) (HCC) Active Problems:   Hypertension   CKD (chronic kidney disease) stage 3, GFR 30-59 ml/min   Essential hypertension   CAD- mild, non obstructive CAD 2008   Atrial flutter (HCC)   Acute systolic CHF (congestive heart failure) (HCC)   OSA on CPAP   Diabetes mellitus type 2, controlled, with complications (HCC)  Acute on Chronic systolic CHF (patient weight 113.3 kg) -Resolved on May 2020 echocardiogram EF 55 to 60% -Acute CHF?  Echocardiogram pending -Daily weight Filed Weights   01/24/20 1358 01/24/20 2053 01/25/20 0619  Weight: 127 kg 123.5 kg 122.1 kg  -Lasix  IV 60 mg BID (on torsemide at home) -Delene Loll held secondary to worsening renal function -Coreg 25 mg BID  Paroxysmal atrial fibrillation -Continue Xarelto -See CHF -Currently NSR  HTN -See CHF  Acute on CKD stage IIIb (baseline Cr 1.85) Recent Labs  Lab 01/24/20 1414 01/25/20 0521  CREATININE 2.46* 2.32*    DM type II controlled with complication -0/09 hemoglobin A1c= 6.4 -NovoLog 70/30 125 units breakfast -NovoLog 70/30 15 units supper -Super sensitive SSI  Obese (BMI 37.55 kg/m)   OSA/OHS -CPAP per respiratory settings  Normal pressure hydrocephalus -S/p VP shunt placement  Chronic anemia  Thrombocytopenia    DVT prophylaxis: Xarelto Code Status: Full Family Communication:  Disposition Plan: TBD   Consultants:  Cardiology  Procedures/Significant Events:    I have personally reviewed and interpreted all radiology studies and my findings are as above.  VENTILATOR SETTINGS:    Cultures 3/2 SARS coronavirus 2 negative    Antimicrobials:    Devices    LINES / TUBES:      Continuous Infusions:   Objective: Vitals:   01/24/20 1800 01/24/20 1846 01/24/20 2053 01/25/20 0619  BP: 122/61 117/78 120/62 (!) 99/49  Pulse: 60 67 82 92  Resp: 16 17 18 18   Temp:    98.3 F (36.8 C)  TempSrc:    Oral  SpO2: 91% 97% 97% 100%  Weight:   123.5 kg 122.1 kg  Height:        Intake/Output Summary (Last 24  hours) at 01/25/2020 0755 Last data filed at 01/25/2020 7681 Gross per 24 hour  Intake 240 ml  Output 1550 ml  Net -1310 ml   Filed Weights   01/24/20 1358 01/24/20 2053 01/25/20 0619  Weight: 127 kg 123.5 kg 122.1 kg    Examination:  General: A/O x4, no acute respiratory distress Eyes: negative scleral hemorrhage, negative anisocoria, negative icterus ENT: Negative Runny nose, negative gingival bleeding, Neck:  Negative scars, masses, torticollis, lymphadenopathy, JVD Lungs: Clear to auscultation bilaterally without wheezes or  crackles Cardiovascular: Regular rate and rhythm without murmur gallop or rub normal S1 and S2 Abdomen: OBESE negative abdominal pain, nondistended, positive soft, bowel sounds, no rebound, no ascites, no appreciable mass Extremities: No significant cyanosis, clubbing, +2 to 3+ pitting edema to hips  Skin: Negative rashes, lesions, ulcers Psychiatric:  Negative depression, negative anxiety, negative fatigue, negative mania  Central nervous system:  Cranial nerves II through XII intact, tongue/uvula midline, all extremities muscle strength 5/5, sensation intact throughout, negative dysarthria, negative expressive aphasia, negative receptive aphasia.  .     Data Reviewed: Care during the described time interval was provided by me .  I have reviewed this patient's available data, including medical history, events of note, physical examination, and all test results as part of my evaluation.  CBC: Recent Labs  Lab 01/24/20 1414 01/25/20 0521  WBC 5.7 7.8  NEUTROABS 3.8 5.6  HGB 11.4* 11.8*  HCT 38.3* 39.0  MCV 85.7 83.9  PLT 148* PLATELET CLUMPS NOTED ON SMEAR, UNABLE TO ESTIMATE   Basic Metabolic Panel: Recent Labs  Lab 01/24/20 1414 01/25/20 0521  NA 141 144  K 3.9 4.1  CL 99 98  CO2 32 32  GLUCOSE 105* 103*  BUN 26* 22  CREATININE 2.46* 2.32*  CALCIUM 9.3 9.6  MG  --  2.3   GFR: Estimated Creatinine Clearance: 36 mL/min (A) (by C-G formula based on SCr of 2.32 mg/dL (H)). Liver Function Tests: Recent Labs  Lab 01/25/20 0521  AST 19  ALT 15  ALKPHOS 36*  BILITOT 1.0  PROT 6.1*  ALBUMIN 3.3*   No results for input(s): LIPASE, AMYLASE in the last 168 hours. No results for input(s): AMMONIA in the last 168 hours. Coagulation Profile: No results for input(s): INR, PROTIME in the last 168 hours. Cardiac Enzymes: No results for input(s): CKTOTAL, CKMB, CKMBINDEX, TROPONINI in the last 168 hours. BNP (last 3 results) No results for input(s): PROBNP in the last 8760  hours. HbA1C: No results for input(s): HGBA1C in the last 72 hours. CBG: Recent Labs  Lab 01/24/20 1736 01/24/20 1830 01/24/20 2218 01/25/20 0617  GLUCAP 43* 77 169* 101*   Lipid Profile: No results for input(s): CHOL, HDL, LDLCALC, TRIG, CHOLHDL, LDLDIRECT in the last 72 hours. Thyroid Function Tests: Recent Labs    01/25/20 0521  TSH 2.037   Anemia Panel: No results for input(s): VITAMINB12, FOLATE, FERRITIN, TIBC, IRON, RETICCTPCT in the last 72 hours. Sepsis Labs: No results for input(s): PROCALCITON, LATICACIDVEN in the last 168 hours.  Recent Results (from the past 240 hour(s))  SARS CORONAVIRUS 2 (TAT 6-24 HRS) Nasopharyngeal Nasopharyngeal Swab     Status: None   Collection Time: 01/24/20  1:17 PM   Specimen: Nasopharyngeal Swab  Result Value Ref Range Status   SARS Coronavirus 2 NEGATIVE NEGATIVE Final    Comment: (NOTE) SARS-CoV-2 target nucleic acids are NOT DETECTED. The SARS-CoV-2 RNA is generally detectable in upper and lower respiratory specimens during the acute phase  of infection. Negative results do not preclude SARS-CoV-2 infection, do not rule out co-infections with other pathogens, and should not be used as the sole basis for treatment or other patient management decisions. Negative results must be combined with clinical observations, patient history, and epidemiological information. The expected result is Negative. Fact Sheet for Patients: SugarRoll.be Fact Sheet for Healthcare Providers: https://www.Isais Klipfel-mathews.com/ This test is not yet approved or cleared by the [place] FDA and  has been authorized for detection and/or diagnosis of SARS-CoV-2 by FDA under an Emergency Use Authorization (EUA). This EUA will remain  in effect (meaning this test can be used) for the duration of the COVID-19 declaration under Section 56 4(b)(1) of the Act, 21 U.S.C. section 360bbb-3(b)(1), unless the authorization is  terminated or revoked sooner. Performed at [facility], [place] [address], [place], [place] [address]          Radiology Studies: DG Chest Portable 1 View  Result Date: 01/24/2020 CLINICAL DATA:  Dyspnea. EXAM: PORTABLE CHEST 1 VIEW COMPARISON:  July 18, 2018. FINDINGS: The heart size and mediastinal contours are within normal limits. Both lungs are clear. No pneumothorax or pleural effusion is noted. Right-sided ventriculoperitoneal shunt is again noted. Stable left-sided pacemaker. The visualized skeletal structures are unremarkable. IMPRESSION: No active disease. Electronically Signed   By: Marijo Conception M.D.   On: 01/24/2020 14:50        Scheduled Meds: . vitamin C  1,000 mg Oral BID  . carvedilol  25 mg Oral BID WC  . cholecalciferol  1,000 Units Oral Daily  . fenofibrate  54 mg Oral Daily  . fluticasone furoate-vilanterol  1 puff Inhalation Daily  . furosemide  60 mg Intravenous Daily  . insulin aspart  0-6 Units Subcutaneous TID WC  . insulin aspart protamine- aspart  125 Units Subcutaneous Q breakfast  . insulin aspart protamine- aspart  15 Units Subcutaneous Q supper  . loratadine  10 mg Oral BID  . multivitamin with minerals  1 tablet Oral Daily  . pantoprazole  40 mg Oral Daily  . Rivaroxaban  15 mg Oral Q supper  . simvastatin  40 mg Oral QHS  . tamsulosin  0.4 mg Oral Daily  . umeclidinium bromide  1 puff Inhalation Daily   Continuous Infusions:   LOS: 1 day    Time spent:40 min    Farris Blash, Geraldo Docker, MD [facility] Pager [phone]  If 7PM-7AM, please contact night-coverage [email or web address] Password [facility] 01/25/2020, 7:55 AM

## 2020-01-26 ENCOUNTER — Encounter (HOSPITAL_COMMUNITY)
Admission: EM | Disposition: A | Discharge status: Skilled Nursing Facility | Payer: Self-pay | Source: Home / Self Care | Attending: Internal Medicine

## 2020-01-26 DIAGNOSIS — E662 Morbid (severe) obesity with alveolar hypoventilation: Secondary | ICD-10-CM

## 2020-01-26 DIAGNOSIS — N1832 Chronic kidney disease, stage 3b: Secondary | ICD-10-CM

## 2020-01-26 DIAGNOSIS — N183 Chronic kidney disease, stage 3 unspecified: Secondary | ICD-10-CM

## 2020-01-26 DIAGNOSIS — I5023 Acute on chronic systolic (congestive) heart failure: Secondary | ICD-10-CM

## 2020-01-26 DIAGNOSIS — N179 Acute kidney failure, unspecified: Secondary | ICD-10-CM

## 2020-01-26 HISTORY — PX: RIGHT HEART CATH: CATH118263

## 2020-01-26 LAB — POCT I-STAT EG7
Acid-Base Excess: 9 mmol/L — ABNORMAL HIGH (ref 0.0–2.0)
Acid-Base Excess: 9 mmol/L — ABNORMAL HIGH (ref 0.0–2.0)
Bicarbonate: 35.4 mmol/L — ABNORMAL HIGH (ref 20.0–28.0)
Bicarbonate: 35.6 mmol/L — ABNORMAL HIGH (ref 20.0–28.0)
Calcium, Ion: 1.17 mmol/L (ref 1.15–1.40)
Calcium, Ion: 1.2 mmol/L (ref 1.15–1.40)
HCT: 35 % — ABNORMAL LOW (ref 39.0–52.0)
HCT: 36 % — ABNORMAL LOW (ref 39.0–52.0)
Hemoglobin: 11.9 g/dL — ABNORMAL LOW (ref 13.0–17.0)
Hemoglobin: 12.2 g/dL — ABNORMAL LOW (ref 13.0–17.0)
O2 Saturation: 68 %
O2 Saturation: 69 %
Potassium: 3.7 mmol/L (ref 3.5–5.1)
Potassium: 3.7 mmol/L (ref 3.5–5.1)
Sodium: 143 mmol/L (ref 135–145)
Sodium: 143 mmol/L (ref 135–145)
TCO2: 37 mmol/L — ABNORMAL HIGH (ref 22–32)
TCO2: 37 mmol/L — ABNORMAL HIGH (ref 22–32)
pCO2, Ven: 55.2 mmHg (ref 44.0–60.0)
pCO2, Ven: 56.2 mmHg (ref 44.0–60.0)
pH, Ven: 7.409 (ref 7.250–7.430)
pH, Ven: 7.415 (ref 7.250–7.430)
pO2, Ven: 36 mmHg (ref 32.0–45.0)
pO2, Ven: 37 mmHg (ref 32.0–45.0)

## 2020-01-26 LAB — BLOOD GAS, ARTERIAL
Acid-Base Excess: 9.6 mmol/L — ABNORMAL HIGH (ref 0.0–2.0)
Bicarbonate: 34.1 mmol/L — ABNORMAL HIGH (ref 20.0–28.0)
Drawn by: 54887
FIO2: 21
O2 Saturation: 92.5 %
Patient temperature: 37
pCO2 arterial: 50.4 mmHg — ABNORMAL HIGH (ref 32.0–48.0)
pH, Arterial: 7.445 (ref 7.350–7.450)
pO2, Arterial: 65.7 mmHg — ABNORMAL LOW (ref 83.0–108.0)

## 2020-01-26 LAB — BASIC METABOLIC PANEL
Anion gap: 13 (ref 5–15)
BUN: 22 mg/dL (ref 8–23)
CO2: 34 mmol/L — ABNORMAL HIGH (ref 22–32)
Calcium: 9.5 mg/dL (ref 8.9–10.3)
Chloride: 99 mmol/L (ref 98–111)
Creatinine, Ser: 2.7 mg/dL — ABNORMAL HIGH (ref 0.61–1.24)
GFR calc Af Amer: 25 mL/min — ABNORMAL LOW (ref >60)
GFR calc non Af Amer: 22 mL/min — ABNORMAL LOW (ref >60)
Glucose, Bld: 100 mg/dL — ABNORMAL HIGH (ref 70–99)
Potassium: 3.8 mmol/L (ref 3.5–5.1)
Sodium: 146 mmol/L — ABNORMAL HIGH (ref 135–145)

## 2020-01-26 LAB — GLUCOSE, CAPILLARY
Glucose-Capillary: 110 mg/dL — ABNORMAL HIGH (ref 70–99)
Glucose-Capillary: 117 mg/dL — ABNORMAL HIGH (ref 70–99)
Glucose-Capillary: 119 mg/dL — ABNORMAL HIGH (ref 70–99)
Glucose-Capillary: 147 mg/dL — ABNORMAL HIGH (ref 70–99)

## 2020-01-26 LAB — MAGNESIUM: Magnesium: 2.4 mg/dL (ref 1.7–2.4)

## 2020-01-26 LAB — PLATELET COUNT: Platelets: 145 10*3/uL — ABNORMAL LOW (ref 150–400)

## 2020-01-26 SURGERY — RIGHT HEART CATH
Anesthesia: LOCAL

## 2020-01-26 MED ORDER — FENTANYL CITRATE (PF) 100 MCG/2ML IJ SOLN
INTRAMUSCULAR | Status: DC | PRN
  Administered 2020-01-26: 25 ug via INTRAVENOUS

## 2020-01-26 MED ORDER — HEPARIN (PORCINE) IN NACL 2000-0.9 UNIT/L-% IV SOLN
INTRAVENOUS | Status: AC
  Filled 2020-01-26: qty 1000

## 2020-01-26 MED ORDER — HEPARIN (PORCINE) IN NACL 1000-0.9 UT/500ML-% IV SOLN
INTRAVENOUS | Status: DC | PRN
  Administered 2020-01-26: 500 mL

## 2020-01-26 MED ORDER — LIDOCAINE HCL (PF) 1 % IJ SOLN
INTRAMUSCULAR | Status: AC
  Filled 2020-01-26: qty 30

## 2020-01-26 MED ORDER — LIDOCAINE HCL (PF) 1 % IJ SOLN
INTRAMUSCULAR | Status: DC | PRN
  Administered 2020-01-26: 2 mL

## 2020-01-26 MED ORDER — FENTANYL CITRATE (PF) 100 MCG/2ML IJ SOLN
INTRAMUSCULAR | Status: AC
  Filled 2020-01-26: qty 2

## 2020-01-26 MED ORDER — HEPARIN (PORCINE) IN NACL 1000-0.9 UT/500ML-% IV SOLN
INTRAVENOUS | Status: AC
  Filled 2020-01-26: qty 500

## 2020-01-26 SURGICAL SUPPLY — 9 items
CATH BALLN WEDGE 5F 110CM (CATHETERS) ×1 IMPLANT
KIT HEART LEFT (KITS) ×2 IMPLANT
PACK CARDIAC CATHETERIZATION (CUSTOM PROCEDURE TRAY) ×2 IMPLANT
PROTECTION STATION PRESSURIZED (MISCELLANEOUS) ×2
SHEATH GLIDE SLENDER 4/5FR (SHEATH) ×1 IMPLANT
STATION PROTECTION PRESSURIZED (MISCELLANEOUS) IMPLANT
TRANSDUCER W/STOPCOCK (MISCELLANEOUS) ×2 IMPLANT
TUBING ART PRESS 72  MALE/FEM (TUBING) ×1
TUBING ART PRESS 72 MALE/FEM (TUBING) IMPLANT

## 2020-01-26 NOTE — Progress Notes (Signed)
Patient ID: Justin Hill, male   DOB: 25-Apr-1941, 79 y.o.   MRN: [id number]     Advanced Heart Failure Rounding Note  PCP-Cardiologist: Loralie Champagne, MD   Subjective:    No complaints this morning.  Thinks breathing may be better.    Creatinine up to 2.7, has been getting Lasix 60 mg IV bid.    Objective:   Weight Range: 120.6 kg Body mass index is 37.09 kg/m.   Vital Signs:   Temp:  [97.9 F (36.6 C)-99.1 F (37.3 C)] 98.1 F (36.7 C) (03/04 0344) Pulse Rate:  [82-133] 93 (03/04 0809) Resp:  [7-74] 10 (03/04 0809) BP: (103-156)/(50-83) 149/76 (03/04 0809) SpO2:  [89 %-99 %] 99 % (03/04 0809) Weight:  [120.6 kg] 120.6 kg (03/04 0318) Last BM Date: 01/25/20  Weight change: Filed Weights   01/24/20 2053 01/25/20 0619 01/26/20 0318  Weight: 123.5 kg 122.1 kg 120.6 kg    Intake/Output:   Intake/Output Summary (Last 24 hours) at 01/26/2020 0816 Last data filed at 01/26/2020 5397 Gross per 24 hour  Intake 240 ml  Output 2725 ml  Net -2485 ml      Physical Exam    General:  Well appearing. No resp difficulty HEENT: Normal Neck: Thick. JVP difficult but does not appear elevated. Carotids 2+ bilat; no bruits. No lymphadenopathy or thyromegaly appreciated. Cor: PMI nondisplaced. Regular rate & rhythm. No rubs, gallops or murmurs. Lungs: Clear Abdomen: Soft, nontender, nondistended. No hepatosplenomegaly. No bruits or masses. Good bowel sounds. Extremities: No cyanosis, clubbing, rash, edema Neuro: Alert & orientedx3, cranial nerves grossly intact. moves all 4 extremities w/o difficulty. Affect pleasant   Telemetry   NSR with BiV pacing (personally reviewed)  Labs    CBC Recent Labs    01/24/20 1414 01/24/20 1414 01/25/20 0521 01/26/20 0632  WBC 5.7  --  7.8  --   NEUTROABS 3.8  --  5.6  --   HGB 11.4*  --  11.8*  --   HCT 38.3*  --  39.0  --   MCV 85.7  --  83.9  --   PLT 148*   < > PLATELET CLUMPS NOTED ON SMEAR, UNABLE TO ESTIMATE 145*   < > =  values in this interval not displayed.   Basic Metabolic Panel Recent Labs    01/25/20 0521 01/26/20 0236  NA 144 146*  K 4.1 3.8  CL 98 99  CO2 32 34*  GLUCOSE 103* 100*  BUN 22 22  CREATININE 2.32* 2.70*  CALCIUM 9.6 9.5  MG 2.3 2.4   Liver Function Tests Recent Labs    01/25/20 0521  AST 19  ALT 15  ALKPHOS 36*  BILITOT 1.0  PROT 6.1*  ALBUMIN 3.3*   No results for input(s): LIPASE, AMYLASE in the last 72 hours. Cardiac Enzymes No results for input(s): CKTOTAL, CKMB, CKMBINDEX, TROPONINI in the last 72 hours.  BNP: BNP (last 3 results) Recent Labs    01/24/20 1414  BNP 35.1    ProBNP (last 3 results) No results for input(s): PROBNP in the last 8760 hours.   D-Dimer No results for input(s): DDIMER in the last 72 hours. Hemoglobin A1C No results for input(s): HGBA1C in the last 72 hours. Fasting Lipid Panel No results for input(s): CHOL, HDL, LDLCALC, TRIG, CHOLHDL, LDLDIRECT in the last 72 hours. Thyroid Function Tests Recent Labs    01/25/20 0521  TSH 2.037    Other results:   Imaging    CARDIAC CATHETERIZATION  Result Date: 01/26/2020 1. Mildly elevated PCWP, normal RA pressure. 2. Mild pulmonary venous hypertension. 3. Normal cardiac output.   ECHOCARDIOGRAM COMPLETE  Result Date: 01/25/2020    ECHOCARDIOGRAM REPORT   Patient Name:   Justin Hill [facility] Date of Exam: 01/25/2020 Medical Rec #:  [id number]          Height:       71.0 in Accession #:    [id number]         Weight:       269.2 lb Date of Birth:  1941-10-11           BSA:          2.392 m Patient Age:    33 years           BP:           116/50 mmHg Patient Gender: M                  HR:           87 bpm. Exam Location:  Inpatient Procedure: 2D Echo, Cardiac Doppler and Color Doppler Indications:    CHF-Acute Systolic 962.95/M84.13  History:        Patient has prior history of Echocardiogram examinations, most                 recent 04/13/2019. CHF, CAD, Arrythmias:Atrial Flutter; Risk                  Factors:Former Smoker, Diabetes, Hypertension, Sleep Apnea and                 Dyslipidemia. CKD. Nonischemic cardiomyopathy.  Sonographer:    Clayton Lefort RDCS (AE) Referring Phys: [id number] [place] Comments: Patient is morbidly obese. Image acquisition challenging due to patient body habitus. IMPRESSIONS  1. Left ventricular ejection fraction, by estimation, is 55%. The left ventricle has normal function. The left ventricle has no regional wall motion abnormalities. There is mild left ventricular hypertrophy. Left ventricular diastolic parameters are indeterminate.  2. Right ventricular systolic function is normal. The right ventricular size is mildly enlarged. Tricuspid regurgitation signal is inadequate for assessing PA pressure.  3. Right atrial size was mildly dilated.  4. The mitral valve is normal in structure and function. No evidence of mitral valve regurgitation. No evidence of mitral stenosis.  5. The aortic valve is tricuspid. Aortic valve regurgitation is not visualized. Mild aortic valve sclerosis is present, with no evidence of aortic valve stenosis.  6. The inferior vena cava is normal in size with greater than 50% respiratory variability, suggesting right atrial pressure of 3 mmHg. FINDINGS  Left Ventricle: Left ventricular ejection fraction, by estimation, is 55%. The left ventricle has normal function. The left ventricle has no regional wall motion abnormalities. The left ventricular internal cavity size was normal in size. There is mild left ventricular hypertrophy. Left ventricular diastolic parameters are indeterminate. Right Ventricle: The right ventricular size is mildly enlarged. No increase in right ventricular wall thickness. Right ventricular systolic function is normal. Tricuspid regurgitation signal is inadequate for assessing PA pressure. Left Atrium: Left atrial size was normal in size. Right Atrium: Right atrial size was mildly dilated. Pericardium: There  is no evidence of pericardial effusion. Mitral Valve: The mitral valve is normal in structure and function. No evidence of mitral valve regurgitation. No evidence of mitral valve stenosis. Tricuspid Valve: The tricuspid valve is normal in structure. Tricuspid valve regurgitation is not demonstrated. Aortic  Valve: The aortic valve is tricuspid. Aortic valve regurgitation is not visualized. Mild aortic valve sclerosis is present, with no evidence of aortic valve stenosis. Aortic valve mean gradient measures 4.0 mmHg. Aortic valve peak gradient measures 6.9 mmHg. Aortic valve area, by VTI measures 2.32 cm. Pulmonic Valve: The pulmonic valve was normal in structure. Pulmonic valve regurgitation is not visualized. Aorta: The aortic root is normal in size and structure. Venous: The inferior vena cava is normal in size with greater than 50% respiratory variability, suggesting right atrial pressure of 3 mmHg. IAS/Shunts: No atrial level shunt detected by color flow Doppler. Additional Comments: A pacer wire is visualized in the right ventricle.  LEFT VENTRICLE PLAX 2D LVIDd:         4.30 cm LVIDs:         2.85 cm LV PW:         1.43 cm LV IVS:        1.35 cm LVOT diam:     2.00 cm LV SV:         62 LV SV Index:   26 LVOT Area:     3.14 cm  RIGHT VENTRICLE RV Basal diam:  4.14 cm RV Mid diam:    3.96 cm RV S prime:     13.10 cm/s TAPSE (M-mode): 2.6 cm LEFT ATRIUM           Index       RIGHT ATRIUM           Index LA diam:      3.20 cm 1.34 cm/m  RA Area:     23.00 cm LA Vol (A2C): 54.9 ml 22.95 ml/m RA Volume:   75.70 ml  31.64 ml/m LA Vol (A4C): 27.4 ml 11.45 ml/m  AORTIC VALVE AV Area (Vmax):    2.45 cm AV Area (Vmean):   2.13 cm AV Area (VTI):     2.32 cm AV Vmax:           131.00 cm/s AV Vmean:          93.700 cm/s AV VTI:            0.265 m AV Peak Grad:      6.9 mmHg AV Mean Grad:      4.0 mmHg LVOT Vmax:         102.00 cm/s LVOT Vmean:        63.500 cm/s LVOT VTI:          0.196 m LVOT/AV VTI ratio: 0.74   AORTA Ao Root diam: 3.20 cm Ao Asc diam:  3.30 cm  SHUNTS Systemic VTI:  0.20 m Systemic Diam: 2.00 cm Loralie Champagne MD Electronically signed by Loralie Champagne MD Signature Date/Time: 01/25/2020/4:49:40 PM    Final       Medications:     Scheduled Medications: . [MAR Hold] vitamin C  1,000 mg Oral BID  . [MAR Hold] carvedilol  25 mg Oral BID WC  . [MAR Hold] cholecalciferol  1,000 Units Oral Daily  . [MAR Hold] fenofibrate  54 mg Oral Daily  . [MAR Hold] fluticasone furoate-vilanterol  1 puff Inhalation Daily  . [MAR Hold] insulin aspart  0-6 Units Subcutaneous TID WC  . [MAR Hold] insulin aspart protamine- aspart  125 Units Subcutaneous Q breakfast  . [MAR Hold] insulin aspart protamine- aspart  15 Units Subcutaneous Q supper  . [MAR Hold] loratadine  10 mg Oral BID  . [MAR Hold] multivitamin with minerals  1 tablet Oral  Daily  . [MAR Hold] pantoprazole  40 mg Oral Daily  . [MAR Hold] Rivaroxaban  15 mg Oral Q supper  . [MAR Hold] simvastatin  40 mg Oral QHS  . [MAR Hold] sodium chloride flush  3 mL Intravenous Q12H  . [MAR Hold] tamsulosin  0.4 mg Oral Daily  . [MAR Hold] umeclidinium bromide  1 puff Inhalation Daily     Infusions: . sodium chloride    . sodium chloride 10 mL/hr at 01/26/20 0643     PRN Medications:  sodium chloride, [MAR Hold] acetaminophen **OR** [MAR Hold] acetaminophen, fentaNYL, Heparin (Porcine) in NaCl, lidocaine (PF), [MAR Hold] ondansetron **OR** [MAR Hold] ondansetron (ZOFRAN) IV, sodium chloride flush  Assessment/Plan   1. Acute on chronic diastolic CHF: Prior nonischemic cardiomyopathy.  Low EF x years. Echo 6/16 showed EF 25-30% with septal-lateral dyssynchrony.  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  Interestingly, it also appears that he has a long-standing LBBB.  A chronic LBBB itself can potentially cause a cardiomyopathy and may be the source of his low EF.   Coronary  angiography in 6/17 with nonobstructive disease. Now s/p [employer] CRT-D device placement.  Echo in 5/20 showed EF normalized, 55-60%.  Echo this admission showed EF still 55%.   He was admitted this time for persistent dyspnea despite increasing outpatient diuretics.  He got IV Lasix here,  weight down but has developed AKI on CKD with creatinine up to 2.7.  RHC was done today, mildly elevated PCWP and normal RA pressure.  - Stop Lasix.  If creatinine is trending down, can restart home torsemide 60 qam/40 qpm tomorrow.    - Continue Coreg 25 mg bid.  - Stop Entresto with AKI. Think we can leave off with lightheadedness at home and EF recovery.    2. AKI on CKD stage 3: Suspect due to over-diuresis.  Difficult to gauge volume status due to body habitus and baseline dyspnea likely in part due to obesity and deconditioning.  3. Atrial flutter: s/p ablation, in NSR today.  4. Atrial fibrillation: Patient had afib noted as well as flutter. He is in NSR today.  - Continue Xarelto 15 mg daily.  5. Normal pressure hydrocephalus: Has VP shunt.   6. OSA: Continue to use CPAP.   Length of Stay: 2  Loralie Champagne, MD  01/26/2020, 8:16 AM  Advanced Heart Failure Team Pager [phone] (M-F; 7a - 4p)  Please contact [facility] for night-coverage after hours (4p -7a ) and weekends on [email or web address]

## 2020-01-26 NOTE — Progress Notes (Addendum)
PROGRESS NOTE    Justin Hill  ZHY:[id number] DOB: 1941/10/13 DOA: 01/24/2020 PCP: Serita Grammes, MD     Brief Narrative:  Justin Hill is a 79 y.o.WM PMHx chronic systolic CHF last EF measured was 55 to 60% on May 2020 (resolved),s/p ablation of atrial flutter Hx of A. fib on Xarelto, pacer, DM type II controlled with complication,  normal pressure hydrocephalus s/p VP shunt, CKD stage IIIb ( baseline Cr 1.8 ), OSA on CPAP was referred from the primary care office after patient has become increasingly short of breath.  Patient states over the last 1 week patient has become increasingly short of breath denies any chest pain productive cough fever chills.  Denies any nausea vomiting or diarrhea.  ED Course: In the ER chest x-ray does not show anything acute on exam patient has bilateral lower extremity edema elevated JVD BNP was 35.1 high-sensitivity troponin was 9.  CBC shows anemia and thrombocytopenia which is chronic.  Creatinine is increased to 2.1 with baseline around 1.8-1.9.  Patient was given Lasix 60 mg IV and admitted for acute CHF.  Covid test was negative.   Subjective: 3/4  A/O x2 (does not know when, why).  Patient states does not recall how he arrived at hospital.  Old base weight 250 pounds (113.3 kg).  Patient states weighs himself daily   Assessment & Plan:   Principal Problem:   Acute CHF (congestive heart failure) (HCC) Active Problems:   Hypertension   CKD (chronic kidney disease) stage 3, GFR 30-59 ml/min   Essential hypertension   CAD- mild, non obstructive CAD 2008   Atrial flutter (HCC)   Acute systolic CHF (congestive heart failure) (HCC)   OSA on CPAP   Diabetes mellitus type 2, controlled, with complications (HCC)  Acute on Chronic systolic CHF (patient Base weight 113.3 kg) -Resolved on May 2020 echocardiogram EF 55 to 60% -Acute CHF?  Echocardiogram; not consistent with acute exacerbation see results below -Strict in and out -3.5  L -Daily weight Filed Weights   01/24/20 2053 01/25/20 0619 01/26/20 0318  Weight: 123.5 kg 122.1 kg 120.6 kg  -3/4 Lasix IV 60 mg BID; (hold) increasing creatinine (on torsemide at home), patient s/p RIGHT heart cath -Entresto (held) secondary to worsening renal function -Coreg 25 mg BID -Patient's base weight of 113.3 kg most likely old base weight given patient's confusion do not believe patient has been weighing himself daily as he states and he has truly gained significant weight.  Hx LBBB/cardiomyopathy -S/p Medtronic CRT-D device placement  Paroxysmal atrial fibrillation -Continue Xarelto -See CHF -Currently NSR  HTN -See CHF  Acute on CKD stage IIIb (baseline Cr 1.85) Recent Labs  Lab 01/24/20 1414 01/25/20 0521 01/26/20 0236  CREATININE 2.46* 2.32* 2.70*  -See CHF  DM type II controlled with complication -2/95 hemoglobin A1c= 6.4 -NovoLog 70/30 125 units breakfast -NovoLog 70/30 15 units supper -Super sensitive SSI  Obese (BMI 37.55 kg/m) -Continue diabetic diet  OSA/OHS -CPAP per respiratory settings -3/4 ABG pending.  Increasing confusion  Normal pressure hydrocephalus -S/p VP shunt placement  Chronic anemia (baseline HgB 11.4) Recent Labs  Lab 01/24/20 1414 01/25/20 0521 01/26/20 0806  HGB 11.4* 11.8* 11.9*  12.2*  -Anemia panel pending  Thrombocytopenia -Mild we will continue to monitor. -No signs of overt bleeding.   DVT prophylaxis: Xarelto Code Status: Full Family Communication: 3/4 attempted to call Justin Hill (son) received no answer at phone number given to RN.  Will attempt to call in a.m.  Disposition Plan: TBD   Consultants:  Cardiology   Procedures/Significant Events:  3/4 Echocardiogram; LVEF; 55%.  -No regional wall  motion abnormalities.  -Left  ventricular diastolic parameters are  indeterminate.  3/4 RIGHT heart cath;Mildly elevated PCWP, normal RA pressure.  2. Mild pulmonary venous hypertension.  3. Normal cardiac  output.    I have personally reviewed and interpreted all radiology studies and my findings are as above.  VENTILATOR SETTINGS:    Cultures 3/2 SARS coronavirus 2 negative    Antimicrobials:    Devices    LINES / TUBES:      Continuous Infusions: . sodium chloride    . sodium chloride 10 mL/hr at 01/26/20 0643     Objective: Vitals:   01/26/20 0056 01/26/20 0318 01/26/20 0344 01/26/20 0345  BP: (!) 145/73  (!) 103/57   Pulse: 92  82 90  Resp: 19  20   Temp: 97.9 F (36.6 C)  98.1 F (36.7 C)   TempSrc: Oral  Oral   SpO2: 94%  (!) 89% 91%  Weight:  120.6 kg    Height:        Intake/Output Summary (Last 24 hours) at 01/26/2020 0731 Last data filed at 01/26/2020 4854 Gross per 24 hour  Intake 240 ml  Output 2725 ml  Net -2485 ml   Filed Weights   01/24/20 2053 01/25/20 0619 01/26/20 0318  Weight: 123.5 kg 122.1 kg 120.6 kg   Physical Exam:  General: A/O x2 (does not know when, why), no acute respiratory distress Eyes: negative scleral hemorrhage, negative anisocoria, negative icterus ENT: Negative Runny nose, negative gingival bleeding, Neck:  Negative scars, masses, torticollis, lymphadenopathy, JVD Lungs: Clear to auscultation bilaterally without wheezes or crackles Cardiovascular: Regular rate and rhythm without murmur gallop or rub normal S1 and S2 Abdomen: OBESE, negative abdominal pain, nondistended, positive soft, bowel sounds, no rebound, no ascites, no appreciable mass Extremities: No significant cyanosis, clubbing, +2 pitting edema to hips (chronic?) Skin: Negative rashes, lesions, ulcers Psychiatric:  Negative depression, negative anxiety, negative fatigue, negative mania, confusion Central nervous system:  Cranial nerves II through XII intact, tongue/uvula midline, all extremities muscle strength 5/5, sensation intact throughout, negative dysarthria, negative expressive aphasia, negative receptive aphasia.  .     Data Reviewed: Care  during the described time interval was provided by me .  I have reviewed this patient's available data, including medical history, events of note, physical examination, and all test results as part of my evaluation.  CBC: Recent Labs  Lab 01/24/20 1414 01/25/20 0521 01/26/20 0632  WBC 5.7 7.8  --   NEUTROABS 3.8 5.6  --   HGB 11.4* 11.8*  --   HCT 38.3* 39.0  --   MCV 85.7 83.9  --   PLT 148* PLATELET CLUMPS NOTED ON SMEAR, UNABLE TO ESTIMATE 627*   Basic Metabolic Panel: Recent Labs  Lab 01/24/20 1414 01/25/20 0521 01/26/20 0236  NA 141 144 146*  K 3.9 4.1 3.8  CL 99 98 99  CO2 32 32 34*  GLUCOSE 105* 103* 100*  BUN 26* 22 22  CREATININE 2.46* 2.32* 2.70*  CALCIUM 9.3 9.6 9.5  MG  --  2.3 2.4   GFR: Estimated Creatinine Clearance: 30.7 mL/min (A) (by C-G formula based on SCr of 2.7 mg/dL (H)). Liver Function Tests: Recent Labs  Lab 01/25/20 0521  AST 19  ALT 15  ALKPHOS 36*  BILITOT 1.0  PROT 6.1*  ALBUMIN 3.3*   No results for input(s):  LIPASE, AMYLASE in the last 168 hours. No results for input(s): AMMONIA in the last 168 hours. Coagulation Profile: No results for input(s): INR, PROTIME in the last 168 hours. Cardiac Enzymes: No results for input(s): CKTOTAL, CKMB, CKMBINDEX, TROPONINI in the last 168 hours. BNP (last 3 results) No results for input(s): PROBNP in the last 8760 hours. HbA1C: No results for input(s): HGBA1C in the last 72 hours. CBG: Recent Labs  Lab 01/25/20 0617 01/25/20 1203 01/25/20 1624 01/25/20 2126 01/26/20 0610  GLUCAP 101* 176* 103* 84 110*   Lipid Profile: No results for input(s): CHOL, HDL, LDLCALC, TRIG, CHOLHDL, LDLDIRECT in the last 72 hours. Thyroid Function Tests: Recent Labs    01/25/20 0521  TSH 2.037   Anemia Panel: No results for input(s): VITAMINB12, FOLATE, FERRITIN, TIBC, IRON, RETICCTPCT in the last 72 hours. Sepsis Labs: No results for input(s): PROCALCITON, LATICACIDVEN in the last 168  hours.  Recent Results (from the past 240 hour(s))  SARS CORONAVIRUS 2 (TAT 6-24 HRS) Nasopharyngeal Nasopharyngeal Swab     Status: None   Collection Time: 01/24/20  1:17 PM   Specimen: Nasopharyngeal Swab  Result Value Ref Range Status   SARS Coronavirus 2 NEGATIVE NEGATIVE Final    Comment: (NOTE) SARS-CoV-2 target nucleic acids are NOT DETECTED. The SARS-CoV-2 RNA is generally detectable in upper and lower respiratory specimens during the acute phase of infection. Negative results do not preclude SARS-CoV-2 infection, do not rule out co-infections with other pathogens, and should not be used as the sole basis for treatment or other patient management decisions. Negative results must be combined with clinical observations, patient history, and epidemiological information. The expected result is Negative. Fact Sheet for Patients: SugarRoll.be Fact Sheet for Healthcare Providers: https://www.-mathews.com/ This test is not yet approved or cleared by the [place] FDA and  has been authorized for detection and/or diagnosis of SARS-CoV-2 by FDA under an Emergency Use Authorization (EUA). This EUA will remain  in effect (meaning this test can be used) for the duration of the COVID-19 declaration under Section 56 4(b)(1) of the Act, 21 U.S.C. section 360bbb-3(b)(1), unless the authorization is terminated or revoked sooner. Performed at [facility], [place] [address] [place],  [address]          Radiology Studies: DG Chest Portable 1 View  Result Date: 01/24/2020 CLINICAL DATA:  Dyspnea. EXAM: PORTABLE CHEST 1 VIEW COMPARISON:  July 18, 2018. FINDINGS: The heart size and mediastinal contours are within normal limits. Both lungs are clear. No pneumothorax or pleural effusion is noted. Right-sided ventriculoperitoneal shunt is again noted. Stable left-sided pacemaker. The visualized skeletal structures are unremarkable.  IMPRESSION: No active disease. Electronically Signed   By: Marijo Conception M.D.   On: 01/24/2020 14:50   ECHOCARDIOGRAM COMPLETE  Result Date: 01/25/2020    ECHOCARDIOGRAM REPORT   Patient Name:   Justin Hill [facility] Date of Exam: 01/25/2020 Medical Rec #:  [id number]          Height:       71.0 in Accession #:    [id number]         Weight:       269.2 lb Date of Birth:  [birth year]           BSA:          2.392 m Patient Age:    48 years           BP:  116/50 mmHg Patient Gender: M                  HR:           87 bpm. Exam Location:  Inpatient Procedure: 2D Echo, Cardiac Doppler and Color Doppler Indications:    CHF-Acute Systolic 409.81/X91.47  History:        Patient has prior history of Echocardiogram examinations, most                 recent 04/13/2019. CHF, CAD, Arrythmias:Atrial Flutter; Risk                 Factors:Former Smoker, Diabetes, Hypertension, Sleep Apnea and                 Dyslipidemia. CKD. Nonischemic cardiomyopathy.  Sonographer:    Clayton Lefort RDCS (AE) Referring Phys: [id number] [place] Comments: Patient is morbidly obese. Image acquisition challenging due to patient body habitus. IMPRESSIONS  1. Left ventricular ejection fraction, by estimation, is 55%. The left ventricle has normal function. The left ventricle has no regional wall motion abnormalities. There is mild left ventricular hypertrophy. Left ventricular diastolic parameters are indeterminate.  2. Right ventricular systolic function is normal. The right ventricular size is mildly enlarged. Tricuspid regurgitation signal is inadequate for assessing PA pressure.  3. Right atrial size was mildly dilated.  4. The mitral valve is normal in structure and function. No evidence of mitral valve regurgitation. No evidence of mitral stenosis.  5. The aortic valve is tricuspid. Aortic valve regurgitation is not visualized. Mild aortic valve sclerosis is present, with no evidence of aortic valve stenosis.  6. The inferior  vena cava is normal in size with greater than 50% respiratory variability, suggesting right atrial pressure of 3 mmHg. FINDINGS  Left Ventricle: Left ventricular ejection fraction, by estimation, is 55%. The left ventricle has normal function. The left ventricle has no regional wall motion abnormalities. The left ventricular internal cavity size was normal in size. There is mild left ventricular hypertrophy. Left ventricular diastolic parameters are indeterminate. Right Ventricle: The right ventricular size is mildly enlarged. No increase in right ventricular wall thickness. Right ventricular systolic function is normal. Tricuspid regurgitation signal is inadequate for assessing PA pressure. Left Atrium: Left atrial size was normal in size. Right Atrium: Right atrial size was mildly dilated. Pericardium: There is no evidence of pericardial effusion. Mitral Valve: The mitral valve is normal in structure and function. No evidence of mitral valve regurgitation. No evidence of mitral valve stenosis. Tricuspid Valve: The tricuspid valve is normal in structure. Tricuspid valve regurgitation is not demonstrated. Aortic Valve: The aortic valve is tricuspid. Aortic valve regurgitation is not visualized. Mild aortic valve sclerosis is present, with no evidence of aortic valve stenosis. Aortic valve mean gradient measures 4.0 mmHg. Aortic valve peak gradient measures 6.9 mmHg. Aortic valve area, by VTI measures 2.32 cm. Pulmonic Valve: The pulmonic valve was normal in structure. Pulmonic valve regurgitation is not visualized. Aorta: The aortic root is normal in size and structure. Venous: The inferior vena cava is normal in size with greater than 50% respiratory variability, suggesting right atrial pressure of 3 mmHg. IAS/Shunts: No atrial level shunt detected by color flow Doppler. Additional Comments: A pacer wire is visualized in the right ventricle.  LEFT VENTRICLE PLAX 2D LVIDd:         4.30 cm LVIDs:         2.85 cm LV  PW:  1.43 cm LV IVS:        1.35 cm LVOT diam:     2.00 cm LV SV:         62 LV SV Index:   26 LVOT Area:     3.14 cm  RIGHT VENTRICLE RV Basal diam:  4.14 cm RV Mid diam:    3.96 cm RV S prime:     13.10 cm/s TAPSE (M-mode): 2.6 cm LEFT ATRIUM           Index       RIGHT ATRIUM           Index LA diam:      3.20 cm 1.34 cm/m  RA Area:     23.00 cm LA Vol (A2C): 54.9 ml 22.95 ml/m RA Volume:   75.70 ml  31.64 ml/m LA Vol (A4C): 27.4 ml 11.45 ml/m  AORTIC VALVE AV Area (Vmax):    2.45 cm AV Area (Vmean):   2.13 cm AV Area (VTI):     2.32 cm AV Vmax:           131.00 cm/s AV Vmean:          93.700 cm/s AV VTI:            0.265 m AV Peak Grad:      6.9 mmHg AV Mean Grad:      4.0 mmHg LVOT Vmax:         102.00 cm/s LVOT Vmean:        63.500 cm/s LVOT VTI:          0.196 m LVOT/AV VTI ratio: 0.74  AORTA Ao Root diam: 3.20 cm Ao Asc diam:  3.30 cm  SHUNTS Systemic VTI:  0.20 m Systemic Diam: 2.00 cm Loralie Champagne MD Electronically signed by Loralie Champagne MD Signature Date/Time: 01/25/2020/4:49:40 PM    Final         Scheduled Meds: . [MAR Hold] vitamin C  1,000 mg Oral BID  . [MAR Hold] carvedilol  25 mg Oral BID WC  . [MAR Hold] cholecalciferol  1,000 Units Oral Daily  . [MAR Hold] fenofibrate  54 mg Oral Daily  . [MAR Hold] fluticasone furoate-vilanterol  1 puff Inhalation Daily  . [MAR Hold] furosemide  60 mg Intravenous BID  . [MAR Hold] insulin aspart  0-6 Units Subcutaneous TID WC  . [MAR Hold] insulin aspart protamine- aspart  125 Units Subcutaneous Q breakfast  . [MAR Hold] insulin aspart protamine- aspart  15 Units Subcutaneous Q supper  . [MAR Hold] loratadine  10 mg Oral BID  . [MAR Hold] multivitamin with minerals  1 tablet Oral Daily  . [MAR Hold] pantoprazole  40 mg Oral Daily  . [MAR Hold] Rivaroxaban  15 mg Oral Q supper  . [MAR Hold] simvastatin  40 mg Oral QHS  . [MAR Hold] sodium chloride flush  3 mL Intravenous Q12H  . [MAR Hold] tamsulosin  0.4 mg Oral Daily  .  [MAR Hold] umeclidinium bromide  1 puff Inhalation Daily   Continuous Infusions: . sodium chloride    . sodium chloride 10 mL/hr at 01/26/20 0643     LOS: 2 days    Time spent:40 min    Emmalin Jaquess, Geraldo Docker, MD [facility] Pager [phone]  If 7PM-7AM, please contact night-coverage [email or web address] Password [facility] 01/26/2020, 7:31 AM

## 2020-01-26 NOTE — Progress Notes (Signed)
Patient refused to wear CPAP, stated he only wears while awake and then that is not that often.

## 2020-01-26 NOTE — Interval H&P Note (Signed)
History and Physical Interval Note:  01/26/2020 7:48 AM  Justin Hill  has presented today for surgery, with the diagnosis of heart failure.  The various methods of treatment have been discussed with the patient and family. After consideration of risks, benefits and other options for treatment, the patient has consented to  Procedure(s): RIGHT HEART CATH (N/A) as a surgical intervention.  The patient's history has been reviewed, patient examined, no change in status, stable for surgery.  I have reviewed the patient's chart and labs.  Questions were answered to the patient's satisfaction.     Daveigh Batty [employer]

## 2020-01-26 NOTE — Progress Notes (Signed)
ABG sample was collected and sent to Lab. Lab was called and notified.  

## 2020-01-26 NOTE — Progress Notes (Signed)
Pt went to cath lab via bed .

## 2020-01-26 NOTE — Progress Notes (Signed)
Pt is agitated, pulling heart monitor several times, also pulled PIV twice. Bilateral mittens placed, safety measures in placed.

## 2020-01-27 DIAGNOSIS — E118 Type 2 diabetes mellitus with unspecified complications: Secondary | ICD-10-CM

## 2020-01-27 LAB — GLUCOSE, CAPILLARY
Glucose-Capillary: 143 mg/dL — ABNORMAL HIGH (ref 70–99)
Glucose-Capillary: 160 mg/dL — ABNORMAL HIGH (ref 70–99)
Glucose-Capillary: 159 mg/dL — ABNORMAL HIGH (ref 70–99)
Glucose-Capillary: 164 mg/dL — ABNORMAL HIGH (ref 70–99)
Glucose-Capillary: 253 mg/dL — ABNORMAL HIGH (ref 70–99)

## 2020-01-27 LAB — FERRITIN: Ferritin: 91 ng/mL (ref 24–336)

## 2020-01-27 LAB — CBC
HCT: 39.1 % (ref 39.0–52.0)
Hemoglobin: 11.5 g/dL — ABNORMAL LOW (ref 13.0–17.0)
MCH: 24.8 pg — ABNORMAL LOW (ref 26.0–34.0)
MCHC: 29.4 g/dL — ABNORMAL LOW (ref 30.0–36.0)
MCV: 84.4 fL (ref 80.0–100.0)
Platelets: 147 10*3/uL — ABNORMAL LOW (ref 150–400)
RBC: 4.63 MIL/uL (ref 4.22–5.81)
RDW: 22.7 % — ABNORMAL HIGH (ref 11.5–15.5)
WBC: 6.8 10*3/uL (ref 4.0–10.5)
nRBC: 0 % (ref 0.0–0.2)

## 2020-01-27 LAB — RETICULOCYTES
Immature Retic Fract: 17.7 % — ABNORMAL HIGH (ref 2.3–15.9)
RBC.: 4.64 MIL/uL (ref 4.22–5.81)
Retic Count, Absolute: 83.1 10*3/uL (ref 19.0–186.0)
Retic Ct Pct: 1.8 % (ref 0.4–3.1)

## 2020-01-27 LAB — BASIC METABOLIC PANEL
Anion gap: 9 (ref 5–15)
BUN: 26 mg/dL — ABNORMAL HIGH (ref 8–23)
CO2: 32 mmol/L (ref 22–32)
Calcium: 9.6 mg/dL (ref 8.9–10.3)
Chloride: 101 mmol/L (ref 98–111)
Creatinine, Ser: 2.16 mg/dL — ABNORMAL HIGH (ref 0.61–1.24)
GFR calc Af Amer: 33 mL/min — ABNORMAL LOW (ref >60)
GFR calc non Af Amer: 29 mL/min — ABNORMAL LOW (ref >60)
Glucose, Bld: 138 mg/dL — ABNORMAL HIGH (ref 70–99)
Potassium: 4.3 mmol/L (ref 3.5–5.1)
Sodium: 142 mmol/L (ref 135–145)

## 2020-01-27 LAB — IRON AND TIBC
Iron: 71 ug/dL (ref 45–182)
Saturation Ratios: 17 % — ABNORMAL LOW (ref 17.9–39.5)
TIBC: 406 ug/dL (ref 250–450)
UIBC: 335 ug/dL

## 2020-01-27 LAB — VITAMIN B12: Vitamin B-12: 349 pg/mL (ref 180–914)

## 2020-01-27 LAB — MAGNESIUM: Magnesium: 2.3 mg/dL (ref 1.7–2.4)

## 2020-01-27 LAB — FOLATE: Folate: 33.1 ng/mL (ref >5.9)

## 2020-01-27 MED ORDER — TORSEMIDE 20 MG PO TABS
60.0000 mg | ORAL_TABLET | Freq: Every day | ORAL | Status: DC
  Administered 2020-01-27 – 2020-01-29 (×3): 60 mg via ORAL
  Filled 2020-01-27 (×3): qty 3

## 2020-01-27 MED ORDER — TORSEMIDE 20 MG PO TABS
40.0000 mg | ORAL_TABLET | Freq: Every evening | ORAL | Status: DC
  Administered 2020-01-28: 40 mg via ORAL
  Filled 2020-01-27: qty 2

## 2020-01-27 NOTE — NC FL2 (Signed)
[place] LEVEL OF CARE SCREENING TOOL     IDENTIFICATION  Patient Name: Justin Hill Birthdate: 14-Dec-1940 Sex: male Admission Date (Current Location): 01/24/2020  [facility] and [place] Number:  Herbalist and Address:  [facility] [place] [address], [place], [place] [address]      Provider Number: [id number]  Attending Physician Name and Address:  Allie Bossier, MD  Relative Name and Phone Number:  Forestine Chute [phone]    Current Level of Care: Hospital Recommended Level of Care: [place] Prior Approval Number:    Date Approved/Denied:   PASRR Number: [id number]  Discharge Plan: SNF    Current Diagnoses: Patient Active Problem List   Diagnosis Date Noted  . OSA on CPAP 01/25/2020  . Diabetes mellitus type 2, controlled, with complications ([place]) 38/46/6599  . Acute CHF (congestive heart failure) ([place]) 01/24/2020  . Acute systolic CHF (congestive heart failure) ([place]) 01/24/2020  . ICD (implantable cardioverter-defibrillator) in place 05/25/2019  . Paroxysmal A-fib ([place]) 05/25/2019  . OSA (obstructive sleep apnea) 05/25/2019  . Lethargy 02/05/2017  . Hyperglycemia 02/05/2017  . Dehydration 02/05/2017  . Gastroenteritis 02/05/2017  . Acute encephalopathy 02/04/2017  . Hydrocephalus ([place])   . Chronic systolic dysfunction of left ventricle 06/05/2016  . Diabetes ([place]) 02/24/2016  . Chronic systolic CHF (congestive heart failure) ([place]) 05/18/2015  . Atrial flutter ([place]) 01/19/2015  . CAD- mild, non obstructive CAD 2008 01/16/2015  . Atrial flutter with rapid ventricular response ([place]) 01/16/2015  . Wide-complex tachycardia ([place]) 01/06/2015  . Atrial fibrillation with rapid ventricular response ([place])   . Left bundle branch block   . Obesity (BMI 30-39.9)   . Essential hypertension   . LBBB (left bundle branch block) 12/26/2014  . Benign neoplasm of colon 12/29/2013  . CKD (chronic kidney disease)  stage 3, GFR 30-59 ml/min 12/13/2012  . CHF (congestive heart failure) ([place])   . Hypercholesterolemia   . Hypertension   . Renal insufficiency 02/10/2012  . HYPERCHOLESTEROLEMIA 02/23/2009  . Nonischemic cardiomyopathy ([place]) 02/23/2009  . Congestive heart failure ([place]) 02/23/2009  . HYPERTENSION, HX OF 02/23/2009    Orientation RESPIRATION BLADDER Height & Weight     Self, Time  Normal Incontinent Weight: 266 lb 11.2 oz (121 kg) Height:  5\' 11"  (180.3 cm)  BEHAVIORAL SYMPTOMS/MOOD NEUROLOGICAL BOWEL NUTRITION STATUS      Continent Diet(See discharge summary)  AMBULATORY STATUS COMMUNICATION OF NEEDS Skin   Limited Assist Verbally Normal                       Personal Care Assistance Level of Assistance  Bathing, Feeding, Dressing Bathing Assistance: Limited assistance Feeding assistance: Independent Dressing Assistance: Limited assistance     Functional Limitations Info  Sight, Hearing, Speech Sight Info: Adequate Hearing Info: Adequate Speech Info: Adequate    SPECIAL CARE FACTORS FREQUENCY  PT (By licensed PT), OT (By licensed OT)     PT Frequency: min 5x weekly OT Frequency: min 5x weekly            Contractures Contractures Info: Not present    Additional Factors Info  Code Status, Allergies Code Status Info: FULL Allergies Info: Penicillins,Aspirin,Lisinopril           Current Medications (01/27/2020):  This is the current hospital active medication list Current Facility-Administered Medications  Medication Dose Route Frequency Provider Last Rate Last Admin  . acetaminophen (TYLENOL) tablet 650 mg  650 mg Oral Q6H  PRN Rise Patience, MD   650 mg at 01/25/20 2137   Or  . acetaminophen (TYLENOL) suppository 650 mg  650 mg Rectal Q6H PRN Rise Patience, MD      . ascorbic acid (VITAMIN C) tablet 1,000 mg  1,000 mg Oral BID Rise Patience, MD   1,000 mg at 01/27/20 1610  . carvedilol (COREG) tablet 25 mg  25 mg Oral BID WC  Rise Patience, MD   25 mg at 01/27/20 0917  . cholecalciferol (VITAMIN D3) tablet 1,000 Units  1,000 Units Oral Daily Rise Patience, MD   1,000 Units at 01/27/20 [phone]  . fenofibrate tablet 54 mg  54 mg Oral Daily Rise Patience, MD   54 mg at 01/27/20 5409  . fluticasone furoate-vilanterol (BREO ELLIPTA) 100-25 MCG/INH 1 puff  1 puff Inhalation Daily Rise Patience, MD   1 puff at 01/25/20 [phone]  . insulin aspart (novoLOG) injection 0-6 Units  0-6 Units Subcutaneous TID WC Rise Patience, MD   Stopped at 01/27/20 1150  . insulin aspart protamine- aspart (NOVOLOG MIX 70/30) injection 125 Units  125 Units Subcutaneous Q breakfast Rise Patience, MD   63 Units at 01/27/20 [phone]  . insulin aspart protamine- aspart (NOVOLOG MIX 70/30) injection 15 Units  15 Units Subcutaneous Q supper Rise Patience, MD   15 Units at 01/26/20 1822  . loratadine (CLARITIN) tablet 10 mg  10 mg Oral BID Rise Patience, MD   10 mg at 01/27/20 2956  . multivitamin with minerals tablet 1 tablet  1 tablet Oral Daily Rise Patience, MD   1 tablet at 01/27/20 [phone]  . ondansetron (ZOFRAN) tablet 4 mg  4 mg Oral Q6H PRN Rise Patience, MD       Or  . ondansetron [facility]) injection 4 mg  4 mg Intravenous Q6H PRN Rise Patience, MD      . pantoprazole (PROTONIX) EC tablet 40 mg  40 mg Oral Daily Rise Patience, MD   40 mg at 01/27/20 0916  . Rivaroxaban (XARELTO) tablet 15 mg  15 mg Oral Q supper Rise Patience, MD   15 mg at 01/26/20 8657  . simvastatin (ZOCOR) tablet 40 mg  40 mg Oral QHS Rise Patience, MD   40 mg at 01/26/20 2013  . sodium chloride flush (NS) 0.9 % injection 3 mL  3 mL Intravenous Q12H Bhagat, Bhavinkumar, PA   3 mL at 01/27/20 0919  . tamsulosin (FLOMAX) capsule 0.4 mg  0.4 mg Oral Daily Rise Patience, MD   0.4 mg at 01/27/20 8469  . [START ON 01/28/2020] torsemide (DEMADEX) tablet 40 mg  40 mg Oral QPM Larey Dresser, MD       . torsemide [facility]) tablet 60 mg  60 mg Oral Q breakfast Larey Dresser, MD   60 mg at 01/27/20 1149  . umeclidinium bromide (INCRUSE ELLIPTA) 62.5 MCG/INH 1 puff  1 puff Inhalation Daily Rise Patience, MD   1 puff at 01/25/20 0803     Discharge Medications: Please see discharge summary for a list of discharge medications.  Relevant Imaging Results:  Relevant Lab Results:   Additional Information [phone]  Trula Ore, LCSWA

## 2020-01-27 NOTE — Progress Notes (Signed)
PROGRESS Hill    CONSTANT MANDEVILLE  WJX:[id number] DOB: February 12, 1941 DOA: 01/24/2020 PCP: Serita Grammes, MD     Brief Narrative:  Justin MANDLEY is a 79 y.o.WM PMHx chronic systolic CHF last EF measured was 55 to 60% on May 2020 (resolved),s/p ablation of atrial flutter Hx of A. fib on Xarelto, pacer, DM type II controlled with complication,  normal pressure hydrocephalus s/p VP shunt, CKD stage IIIb ( baseline Cr 1.8 ), OSA on CPAP was referred from the primary care office after patient has become increasingly short of breath.  Patient states over the last 1 week patient has become increasingly short of breath denies any chest pain productive cough fever chills.  Denies any nausea vomiting or diarrhea.  ED Course: In the ER chest x-ray does not show anything acute on exam patient has bilateral lower extremity edema elevated JVD BNP was 35.1 high-sensitivity troponin was 9.  CBC shows anemia and thrombocytopenia which is chronic.  Creatinine is increased to 2.1 with baseline around 1.8-1.9.  Patient was given Lasix 60 mg IV and admitted for acute CHF.  Covid test was negative.   Subjective: 3/5 A/O x4, but patient needs to think for extended period of time before he can answer.  Becomes easily confused and will pull out IV access.   Assessment & Plan:   Principal Problem:   Acute CHF (congestive heart failure) (HCC) Active Problems:   Hypertension   CKD (chronic kidney disease) stage 3, GFR 30-59 ml/min   Essential hypertension   CAD- mild, non obstructive CAD 2008   Atrial flutter (HCC)   Acute systolic CHF (congestive heart failure) (HCC)   OSA on CPAP   Diabetes mellitus type 2, controlled, with complications (HCC)  Acute on Chronic systolic CHF (patient Base weight 113.3 kg) -Resolved on May 2020 echocardiogram EF 55 to 60% -Acute CHF?  Echocardiogram; not consistent with acute exacerbation see results below -Strict in and out -3.9 L -Daily weight Filed Weights    01/25/20 0619 01/26/20 0318 01/27/20 0322  Weight: 122.1 kg 120.6 kg 121 kg  -3/5 cardiology restarted Torsemide 60 mg qAm/  40mg  qPm; will need to watch renal function closely -Entresto (held) secondary to worsening renal function -Coreg 25 mg BID -Patient's base weight of 113.3 kg most likely old base weight given patient's confusion do not believe patient has been weighing himself daily as he states and he has truly gained significant weight.  Hx LBBB/cardiomyopathy -S/p Medtronic CRT-D device placement  Paroxysmal atrial fibrillation -Continue Xarelto -See CHF -Currently NSR  HTN -See CHF  Acute on CKD stage IIIb (baseline Cr 1.85) Recent Labs  Lab 01/24/20 1414 01/25/20 0521 01/26/20 0236 01/27/20 0325  CREATININE 2.46* 2.32* 2.70* 2.16*  -See CHF  DM type II controlled with complication -2/13 hemoglobin A1c= 6.4 -NovoLog 70/30 125 units breakfast -NovoLog 70/30 15 units supper -Super sensitive SSI  Obese (BMI 37.55 kg/m) -Continue diabetic diet  OSA/OHS -CPAP per respiratory settings -3/4 ABG; pH 7.4, PCO2 50.4, PO2 65.7.  Does not account for patient's increasing confusion.    Normal pressure hydrocephalus -S/p VP shunt placement  Chronic anemia (baseline HgB 11.4) Recent Labs  Lab 01/24/20 1414 01/25/20 0521 01/26/20 0806 01/27/20 0325  HGB 11.4* 11.8* 11.9*  12.2* 11.5*  -Anemia panel pending  Thrombocytopenia -Results for AMRITPAL, SHROPSHIRE" (MRN [id number]) as of 01/27/2020 14:20  Ref. Range 01/24/2020 14:14 01/25/2020 05:21 01/26/2020 06:32 01/27/2020 03:25  Platelets Latest Ref Range: 150 - 400 K/uL 148 (  L) PLATELET CLUMPS NOTED ON SMEAR, UNABLE TO ESTIMATE 145 (L) 147 (L)  -No signs of overt bleeding. -Stable   DVT prophylaxis: Xarelto Code Status: Full Family Communication: 3/4 attempted to call Sherren Mocha (son) received no answer Justin phone number given to RN.  Will attempt to call in a.m. Disposition Plan: TBD   Consultants:   Cardiology   Procedures/Significant Events:  3/4 Echocardiogram; LVEF; 55%.  -No regional wall  motion abnormalities.  -Left  ventricular diastolic parameters are  indeterminate.  3/4 RIGHT heart cath;Mildly elevated PCWP, normal RA pressure.  2. Mild pulmonary venous hypertension.  3. Normal cardiac output.    I have personally reviewed and interpreted all radiology studies and my findings are as above.  VENTILATOR SETTINGS:    Cultures 3/2 SARS coronavirus 2 negative    Antimicrobials:    Devices    LINES / TUBES:      Continuous Infusions:    Objective: Vitals:   01/26/20 2118 01/27/20 0322 01/27/20 0434 01/27/20 1148  BP: (!) 141/75  119/75 (!) 121/57  Pulse: 86  86 79  Resp: 20  20 18   Temp: 98.3 F (36.8 C)  97.9 F (36.6 C) 97.9 F (36.6 C)  TempSrc: Oral  Oral Oral  SpO2: 97%   95%  Weight:  121 kg    Height:        Intake/Output Summary (Last 24 hours) Justin 01/27/2020 1232 Last data filed Justin 01/27/2020 0818 Gross per 24 hour  Intake --  Output 425 ml  Net -425 ml   Filed Weights   01/25/20 0619 01/26/20 0318 01/27/20 0322  Weight: 122.1 kg 120.6 kg 121 kg   Physical Exam:  General: A/O x2 (does not know when, why),no acute respiratory distress Eyes: negative scleral hemorrhage, negative anisocoria, negative icterus ENT: Negative Runny nose, negative gingival bleeding, Neck:  Negative scars, masses, torticollis, lymphadenopathy, JVD Lungs: Clear to auscultation bilaterally without wheezes or crackles Cardiovascular: Regular rate and rhythm without murmur gallop or rub normal S1 and S2 Abdomen: OBESE, negative abdominal pain, nondistended, positive soft, bowel sounds, no rebound, no ascites, no appreciable mass Extremities: No significant cyanosis, clubbing, +1 pitting edema  Skin: Negative rashes, lesions, ulcers Psychiatric:  Negative depression, negative anxiety, negative fatigue, negative mania  Central nervous system:  Cranial  nerves II through XII intact, tongue/uvula midline, all extremities muscle strength 5/5, sensation intact throughout, negative dysarthria, negative expressive aphasia, negative receptive aphasia.   .     Data Reviewed: Care during the described time interval was provided by me .  I have reviewed this patient's available data, including medical history, events of [place], physical examination, and all test results as part of my evaluation.  CBC: Recent Labs  Lab 01/24/20 1414 01/25/20 0521 01/26/20 0632 01/26/20 0806 01/27/20 0325  WBC 5.7 7.8  --   --  6.8  NEUTROABS 3.8 5.6  --   --   --   HGB 11.4* 11.8*  --  11.9*  12.2* 11.5*  HCT 38.3* 39.0  --  35.0*  36.0* 39.1  MCV 85.7 83.9  --   --  84.4  PLT 148* PLATELET CLUMPS NOTED ON SMEAR, UNABLE TO ESTIMATE 145*  --  563*   Basic Metabolic Panel: Recent Labs  Lab 01/24/20 1414 01/25/20 0521 01/26/20 0236 01/26/20 0806 01/27/20 0325  NA 141 144 146* 143  143 142  K 3.9 4.1 3.8 3.7  3.7 4.3  CL 99 98 99  --  101  CO2  32 32 34*  --  32  GLUCOSE 105* 103* 100*  --  138*  BUN 26* 22 22  --  26*  CREATININE 2.46* 2.32* 2.70*  --  2.16*  CALCIUM 9.3 9.6 9.5  --  9.6  MG  --  2.3 2.4  --  2.3   GFR: Estimated Creatinine Clearance: 38.5 mL/min (A) (by C-G formula based on SCr of 2.16 mg/dL (H)). Liver Function Tests: Recent Labs  Lab 01/25/20 0521  AST 19  ALT 15  ALKPHOS 36*  BILITOT 1.0  PROT 6.1*  ALBUMIN 3.3*   No results for input(s): LIPASE, AMYLASE in the last 168 hours. No results for input(s): AMMONIA in the last 168 hours. Coagulation Profile: No results for input(s): INR, PROTIME in the last 168 hours. Cardiac Enzymes: No results for input(s): CKTOTAL, CKMB, CKMBINDEX, TROPONINI in the last 168 hours. BNP (last 3 results) No results for input(s): PROBNP in the last 8760 hours. HbA1C: No results for input(s): HGBA1C in the last 72 hours. CBG: Recent Labs  Lab 01/26/20 1814 01/26/20 2147  01/27/20 0641 01/27/20 1030 01/27/20 1143  GLUCAP 119* 147* 143* 160* 159*   Lipid Profile: No results for input(s): CHOL, HDL, LDLCALC, TRIG, CHOLHDL, LDLDIRECT in the last 72 hours. Thyroid Function Tests: Recent Labs    01/25/20 0521  TSH 2.037   Anemia Panel: Recent Labs    01/27/20 0325  VITAMINB12 349  FOLATE 33.1  FERRITIN 91  TIBC 406  IRON 71  RETICCTPCT 1.8   Sepsis Labs: No results for input(s): PROCALCITON, LATICACIDVEN in the last 168 hours.  Recent Results (from the past 240 hour(s))  SARS CORONAVIRUS 2 (TAT 6-24 HRS) Nasopharyngeal Nasopharyngeal Swab     Status: None   Collection Time: 01/24/20  1:17 PM   Specimen: Nasopharyngeal Swab  Result Value Ref Range Status   SARS Coronavirus 2 NEGATIVE NEGATIVE Final    Comment: ([place]) SARS-CoV-2 target nucleic acids are NOT DETECTED. The SARS-CoV-2 RNA is generally detectable in upper and lower respiratory specimens during the acute phase of infection. Negative results do not preclude SARS-CoV-2 infection, do not rule out co-infections with other pathogens, and should not be used as the sole basis for treatment or other patient management decisions. Negative results must be combined with clinical observations, patient history, and epidemiological information. The expected result is Negative. Fact Sheet for Patients: SugarRoll.be Fact Sheet for Healthcare Providers: https://www.-mathews.com/ This test is not yet approved or cleared by the [place] FDA and  has been authorized for detection and/or diagnosis of SARS-CoV-2 by FDA under an Emergency Use Authorization (EUA). This EUA will remain  in effect (meaning this test can be used) for the duration of the COVID-19 declaration under Section 56 4(b)(1) of the Act, 21 U.S.C. section 360bbb-3(b)(1), unless the authorization is terminated or revoked sooner. Performed [facility], [place] [address], [place], [place] [address]          Radiology Studies: CARDIAC CATHETERIZATION  Result Date: 01/26/2020 1. Mildly elevated PCWP, normal RA pressure. 2. Mild pulmonary venous hypertension. 3. Normal cardiac output.        Scheduled Meds: . vitamin C  1,000 mg Oral BID  . carvedilol  25 mg Oral BID WC  . cholecalciferol  1,000 Units Oral Daily  . fenofibrate  54 mg Oral Daily  . fluticasone furoate-vilanterol  1 puff Inhalation Daily  . insulin aspart  0-6 Units Subcutaneous TID WC  . insulin aspart protamine-  aspart  125 Units Subcutaneous Q breakfast  . insulin aspart protamine- aspart  15 Units Subcutaneous Q supper  . loratadine  10 mg Oral BID  . multivitamin with minerals  1 tablet Oral Daily  . pantoprazole  40 mg Oral Daily  . Rivaroxaban  15 mg Oral Q supper  . simvastatin  40 mg Oral QHS  . sodium chloride flush  3 mL Intravenous Q12H  . tamsulosin  0.4 mg Oral Daily  . [START ON 01/28/2020] torsemide  40 mg Oral QPM  . torsemide  60 mg Oral Q breakfast  . umeclidinium bromide  1 puff Inhalation Daily   Continuous Infusions:    LOS: 3 days    Time spent:40 min    Lorali Khamis, Geraldo Docker, MD [facility] Pager [phone]  If 7PM-7AM, please contact night-coverage [email or web address] Password [facility] 01/27/2020, 12:32 PM

## 2020-01-27 NOTE — Progress Notes (Signed)
Patient ID: Justin [place], male   DOB: Mar 20, 1941, 79 y.o.   MRN: [id number]     Advanced Heart Failure Rounding Note  PCP-Cardiologist: Loralie Champagne, MD   Subjective:    No complaints this morning.  Breathing is ok.     Creatinine 2.1, down from 2.7 yesterday.   RHC Procedural Findings: Hemodynamics (mmHg) RA mean 5 RV 40/5 PA 44/14, mean 25 PCWP mean 16 Oxygen saturations: PA 69% AO 95% Cardiac Output (Fick) 7.6  Cardiac Index (Fick) 3.19 PVR 1.1 WU   Objective:   Weight Range: 121 kg Body mass index is 37.2 kg/m.   Vital Signs:   Temp:  [97.9 F (36.6 C)-98.3 F (36.8 C)] 97.9 F (36.6 C) (03/05 0434) Pulse Rate:  [81-86] 86 (03/05 0434) Resp:  [12-20] 20 (03/05 0434) BP: (115-141)/(52-75) 119/75 (03/05 0434) SpO2:  [93 %-97 %] 97 % (03/04 2118) Weight:  [121 kg] 121 kg (03/05 0322) Last BM Date: 01/24/20  Weight change: Filed Weights   01/25/20 0619 01/26/20 0318 01/27/20 0322  Weight: 122.1 kg 120.6 kg 121 kg    Intake/Output:   Intake/Output Summary (Last 24 hours) at 01/27/2020 0947 Last data filed at 01/27/2020 0818 Gross per 24 hour  Intake 120 ml  Output 525 ml  Net -405 ml      Physical Exam    General: NAD Neck: Thick, no JVD, no thyromegaly or thyroid nodule.  Lungs: Clear to auscultation bilaterally with normal respiratory effort. CV: Nondisplaced PMI.  Heart regular S1/S2, no S3/S4, no murmur.  No peripheral edema.  No carotid bruit.   Abdomen: Soft, nontender, no hepatosplenomegaly, no distention.  Skin: Intact without lesions or rashes.  Neurologic: Alert and oriented x 3.  Psych: Normal affect. Extremities: No clubbing or cyanosis.  HEENT: Normal.    Telemetry   NSR with BiV pacing (personally reviewed)  Labs    CBC Recent Labs    01/24/20 1414 01/24/20 1414 01/25/20 0521 01/25/20 0521 01/26/20 0632 01/26/20 0806 01/27/20 0325  WBC 5.7   < > 7.8  --   --   --  6.8  NEUTROABS 3.8  --  5.6  --   --   --   --     HGB 11.4*   < > 11.8*   < >  --  11.9*  12.2* 11.5*  HCT 38.3*   < > 39.0   < >  --  35.0*  36.0* 39.1  MCV 85.7   < > 83.9  --   --   --  84.4  PLT 148*   < > PLATELET CLUMPS NOTED ON SMEAR, UNABLE TO ESTIMATE   < > 145*  --  147*   < > = values in this interval not displayed.   Basic Metabolic Panel Recent Labs    01/26/20 0236 01/26/20 0236 01/26/20 0806 01/27/20 0325  NA 146*   < > 143  143 142  K 3.8   < > 3.7  3.7 4.3  CL 99  --   --  101  CO2 34*  --   --  32  GLUCOSE 100*  --   --  138*  BUN 22  --   --  26*  CREATININE 2.70*  --   --  2.16*  CALCIUM 9.5  --   --  9.6  MG 2.4  --   --  2.3   < > = values in this interval not displayed.   Liver Function  Tests Recent Labs    01/25/20 0521  AST 19  ALT 15  ALKPHOS 36*  BILITOT 1.0  PROT 6.1*  ALBUMIN 3.3*   No results for input(s): LIPASE, AMYLASE in the last 72 hours. Cardiac Enzymes No results for input(s): CKTOTAL, CKMB, CKMBINDEX, TROPONINI in the last 72 hours.  BNP: BNP (last 3 results) Recent Labs    01/24/20 1414  BNP 35.1    ProBNP (last 3 results) No results for input(s): PROBNP in the last 8760 hours.   D-Dimer No results for input(s): DDIMER in the last 72 hours. Hemoglobin A1C No results for input(s): HGBA1C in the last 72 hours. Fasting Lipid Panel No results for input(s): CHOL, HDL, LDLCALC, TRIG, CHOLHDL, LDLDIRECT in the last 72 hours. Thyroid Function Tests Recent Labs    01/25/20 0521  TSH 2.037    Other results:   Imaging    No results found.   Medications:     Scheduled Medications: . vitamin C  1,000 mg Oral BID  . carvedilol  25 mg Oral BID WC  . cholecalciferol  1,000 Units Oral Daily  . fenofibrate  54 mg Oral Daily  . fluticasone furoate-vilanterol  1 puff Inhalation Daily  . insulin aspart  0-6 Units Subcutaneous TID WC  . insulin aspart protamine- aspart  125 Units Subcutaneous Q breakfast  . insulin aspart protamine- aspart  15 Units  Subcutaneous Q supper  . loratadine  10 mg Oral BID  . multivitamin with minerals  1 tablet Oral Daily  . pantoprazole  40 mg Oral Daily  . Rivaroxaban  15 mg Oral Q supper  . simvastatin  40 mg Oral QHS  . sodium chloride flush  3 mL Intravenous Q12H  . tamsulosin  0.4 mg Oral Daily  . umeclidinium bromide  1 puff Inhalation Daily    Infusions:   PRN Medications: acetaminophen **OR** acetaminophen, ondansetron **OR** ondansetron (ZOFRAN) IV  Assessment/Plan   1. Acute on chronic diastolic CHF: Prior nonischemic cardiomyopathy.  Low EF x years. Echo 6/16 showed EF 25-30% with septal-lateral dyssynchrony.  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  Interestingly, it also appears that he has a long-standing LBBB.  A chronic LBBB itself can potentially cause a cardiomyopathy and may be the source of his low EF.   Coronary angiography in 6/17 with nonobstructive disease. Now s/p Medtronic CRT-D device placement.  Echo in 5/20 showed EF normalized, 55-60%.  Echo this admission showed EF still 55%.   He was admitted this time for persistent dyspnea despite increasing outpatient diuretics.  He got IV Lasix here,  weight down but developed AKI on CKD with creatinine up to 2.7.  RHC yesterday showed mildly elevated PCWP and normal RA pressure. Creatinine down to 2.1 today.  - Will restart torsemide today, will give 60 mg x 1 today, then tomorrow restart on home regimen 60 qam/40 qpm.  - Continue Coreg 25 mg bid.  - Stop Entresto with AKI. Think we can leave off with lightheadedness at home and EF recovery.    2. AKI on CKD stage 3: Suspect due to over-diuresis.  Difficult to gauge volume status due to body habitus and baseline dyspnea likely in part due to obesity and deconditioning.  3. Atrial flutter: s/p ablation, in NSR today.  4. Atrial fibrillation: Patient had afib noted as well as flutter. He is in NSR today.  - Continue Xarelto  15 mg daily.  5. Normal pressure hydrocephalus: Has VP shunt.   6. OSA: Continue to use CPAP.   Will make CHF clinic followup.  Cardiac meds for home: torsemide 60 qam/40 qpm, Coreg 25 mg bid, Xarelto 15, Zocor 40, fenofibrate 54.  Would stop Entresto for the time being.   Length of Stay: 3  Loralie Champagne, MD  01/27/2020, 9:47 AM  Advanced Heart Failure Team Pager [phone] (M-F; 7a - 4p)  Please contact [facility] for night-coverage after hours (4p -7a ) and weekends on [email or web address]

## 2020-01-27 NOTE — TOC Initial Note (Signed)
Transition of Care [facility]) - Initial/Assessment Note    Patient Details  Name: Justin Hill MRN: [id number] Date of Birth: 12/02/1940  Transition of Care [facility]) CM/SW Contact:    Alberteen Sam, [place] Phone Number: [phone] 01/27/2020, 1:32 PM  Clinical Narrative:                  CSW spoke with patient's son Brallan regarding discharge planning, he reports he would like patient to go to [facility] SNF, as he is an only child and his mother , patient's wife, is on 6N after having a stroke. He reports patient's wife was his main caregiver, and if he were to be discharged home he would be completely alone as the son lives 3 hours away. He also reports his grandmother (patient's mother) is at  [facility]. Agreeable for referral to be sent to [place].   Referral faxed, pending bed offer at this time.   Expected Discharge Plan: Skilled Nursing Facility Barriers to Discharge: Continued Medical Work up   Patient Goals and CMS Choice   CMS Medicare.gov Compare Post Acute Care list provided to:: Patient Represenative (must comment)([place] (son)) Choice offered to / list presented to : Adult Children  Expected Discharge Plan and Services Expected Discharge Plan: [place] Acute Care Choice: [place] Living arrangements for the past 2 months: Single Family Home                                      Prior Living Arrangements/Services Living arrangements for the past 2 months: Single Family Home Lives with:: Spouse Patient language and need for interpreter reviewed:: Yes Do you feel safe going back to the place where you live?: No   needs short term rehab  Need for Family Participation in Patient Care: Yes (Comment) Care giver support system in place?: Yes (comment)   Criminal Activity/Legal Involvement Pertinent to Current Situation/Hospitalization: No - Comment as needed  Activities of Daily Living Home Assistive  Devices/Equipment: Scales, Eyeglasses, Cane (specify quad or straight), Oxygen, CPAP ADL Screening (condition at time of admission) Patient's cognitive ability adequate to safely complete daily activities?: Yes Is the patient deaf or have difficulty hearing?: No Does the patient have difficulty seeing, even when wearing glasses/contacts?: Yes Does the patient have difficulty concentrating, remembering, or making decisions?: Yes Patient able to express need for assistance with ADLs?: Yes Does the patient have difficulty dressing or bathing?: No Independently performs ADLs?: Yes (appropriate for developmental age) Does the patient have difficulty walking or climbing stairs?: No Weakness of Legs: None Weakness of Arms/Hands: None  Permission Sought/Granted Permission sought to share information with : Case Manager, Customer service manager, Family Supports Permission granted to share information with : Yes, Verbal Permission Granted  Share Information with NAME: Zyiere  Permission granted to share info w AGENCY: SNFs  Permission granted to share info w Relationship: son  Permission granted to share info w Contact Information: [phone]  Emotional Assessment Appearance:: Other (Comment Required(unable to assess)     Orientation: : Oriented to Self, Oriented to  Time Alcohol / Substance Use: Not Applicable Psych Involvement: No (comment)  Admission diagnosis:  Acute CHF (congestive heart failure) (HCC) [E33.2] Acute systolic CHF (congestive heart failure) ([place]) [I50.21] Patient Active Problem List   Diagnosis Date Noted  . OSA on CPAP 01/25/2020  . Diabetes mellitus type 2,  controlled, with complications ([place]) 44/01/4741  . Acute CHF (congestive heart failure) ([place]) 01/24/2020  . Acute systolic CHF (congestive heart failure) ([place]) 01/24/2020  . ICD (implantable cardioverter-defibrillator) in place 05/25/2019  . Paroxysmal A-fib ([place]) 05/25/2019  . OSA (obstructive sleep  apnea) 05/25/2019  . Lethargy 02/05/2017  . Hyperglycemia 02/05/2017  . Dehydration 02/05/2017  . Gastroenteritis 02/05/2017  . Acute encephalopathy 02/04/2017  . Hydrocephalus ([place])   . Chronic systolic dysfunction of left ventricle 06/05/2016  . Diabetes ([place]) 02/24/2016  . Chronic systolic CHF (congestive heart failure) ([place]) 05/18/2015  . Atrial flutter ([place]) 01/19/2015  . CAD- mild, non obstructive CAD 2008 01/16/2015  . Atrial flutter with rapid ventricular response ([place]) 01/16/2015  . Wide-complex tachycardia ([place]) 01/06/2015  . Atrial fibrillation with rapid ventricular response ([place])   . Left bundle branch block   . Obesity (BMI 30-39.9)   . Essential hypertension   . LBBB (left bundle branch block) 12/26/2014  . Benign neoplasm of colon 12/29/2013  . CKD (chronic kidney disease) stage 3, GFR 30-59 ml/min 12/13/2012  . CHF (congestive heart failure) ([place])   . Hypercholesterolemia   . Hypertension   . Renal insufficiency 02/10/2012  . HYPERCHOLESTEROLEMIA 02/23/2009  . Nonischemic cardiomyopathy ([place]) 02/23/2009  . Congestive heart failure ([place]) 02/23/2009  . HYPERTENSION, HX OF 02/23/2009   PCP:  Serita Grammes, MD Pharmacy:   [facility] [address] [place] [address] Phone: [phone] Fax: [phone]     Social Determinants of Health (SDOH) Interventions    Readmission Risk Interventions No flowsheet data found.

## 2020-01-27 NOTE — Evaluation (Signed)
Physical Therapy Evaluation Patient Details Name: Justin Hill MRN: [id number] DOB: November 07, 1941 Today's Date: 01/27/2020   History of Present Illness  Pt is a 79 y/o male admitted secondary to increased SOB. Thought to be secondary to CHF exacerbation. Pt is also s/p R heart cath. PMH includes CKD, HTN, CAD, CHF, OSA on CPAP, and DM.   Clinical Impression  Pt admitted secondary to problem above with deficits below. Pt requiring min to mod A for mobility. Pt also with notable memory deficits and decreased safety awareness. Per RN, pt's wife, who was patient's caregiver, is currently in the hospital and will be unable to provide support. Feel pt will require SNF level therapies at d/c given current deficits. Will continue to follow acutely to maximize functional mobility independence and safety.     Follow Up Recommendations SNF;Supervision/Assistance - 24 hour    Equipment Recommendations  None recommended by PT    Recommendations for Other Services       Precautions / Restrictions Precautions Precautions: Fall Restrictions Weight Bearing Restrictions: No      Mobility  Bed Mobility Overal bed mobility: Needs Assistance Bed Mobility: Supine to Sit;Sit to Supine     Supine to sit: Mod assist Sit to supine: Supervision   General bed mobility comments: Mod A for trunk elevation to come to sitting. Increased time to perform bed mobility.   Transfers Overall transfer level: Needs assistance Equipment used: 1 person hand held assist Transfers: Sit to/from Stand Sit to Stand: Mod assist;Min assist         General transfer comment: Mod A initially for steadying, however, progressed to min A once upright.   Ambulation/Gait Ambulation/Gait assistance: Min assist;Min guard Gait Distance (Feet): 20 Feet Assistive device: None Gait Pattern/deviations: Step-through pattern;Decreased stride length Gait velocity: Decreased   General Gait Details: Required min to min guard A  for steadying throughout. Pt mildly impulsive as well.   Stairs            Wheelchair Mobility    Modified Rankin (Stroke Patients Only)       Balance Overall balance assessment: Needs assistance Sitting-balance support: No upper extremity supported;Feet supported Sitting balance-Leahy Scale: Fair     Standing balance support: Single extremity supported;During functional activity Standing balance-Leahy Scale: Poor Standing balance comment: Reliant on at least 1 UE support                              Pertinent Vitals/Pain Pain Assessment: No/denies pain    Home Living Family/patient expects to be discharged to:: Skilled nursing facility                 Additional Comments: Per RN, pt does not have his primary caregiver at home as she is in the hospital.     Prior Function Level of Independence: Independent         Comments: Pt reports independence with mobility. Unsure of accuracy given cognitive deficits.      Hand Dominance        Extremity/Trunk Assessment   Upper Extremity Assessment Upper Extremity Assessment: Defer to OT evaluation    Lower Extremity Assessment Lower Extremity Assessment: Generalized weakness    Cervical / Trunk Assessment Cervical / Trunk Assessment: Kyphotic  Communication   Communication: No difficulties  Cognition Arousal/Alertness: Awake/alert Behavior During Therapy: Flat affect Overall Cognitive Status: Impaired/Different from baseline Area of Impairment: Memory;Problem solving;Awareness  Memory: Decreased short-term memory;Decreased recall of precautions     Awareness: Emergent Problem Solving: Slow processing General Comments: Pt was not able to remember that his RN came to check on him throughout the day. Also did not remember that his wife was in the hospital.       General Comments      Exercises     Assessment/Plan    PT Assessment Patient needs  continued PT services  PT Problem List Decreased strength;Decreased balance;Decreased cognition;Decreased mobility;Decreased safety awareness;Decreased knowledge of use of DME;Decreased knowledge of precautions       PT Treatment Interventions Gait training;Functional mobility training;Therapeutic activities;Therapeutic exercise;Stair training;DME instruction;Balance training;Cognitive remediation;Patient/family education    PT Goals (Current goals can be found in the Care Plan section)  Acute Rehab PT Goals Patient Stated Goal: to go home PT Goal Formulation: With patient Time For Goal Achievement: 02/10/20 Potential to Achieve Goals: Good    Frequency Min 2X/week   Barriers to discharge Decreased caregiver support Pt's wife who was his caregiver is currently in the hospital.     Co-evaluation               AM-PAC PT "6 Clicks" Mobility  Outcome Measure Help needed turning from your back to your side while in a flat bed without using bedrails?: A Little Help needed moving from lying on your back to sitting on the side of a flat bed without using bedrails?: A Lot Help needed moving to and from a bed to a chair (including a wheelchair)?: A Little Help needed standing up from a chair using your arms (e.g., wheelchair or bedside chair)?: A Lot Help needed to walk in hospital room?: A Little Help needed climbing 3-5 steps with a railing? : A Lot 6 Click Score: 15    End of Session Equipment Utilized During Treatment: Gait belt Activity Tolerance: Patient tolerated treatment well Patient left: in bed;with call bell/phone within reach;with bed alarm set Nurse Communication: Mobility status PT Visit Diagnosis: Unsteadiness on feet (R26.81);Muscle weakness (generalized) (M62.81)    Time: 1829-9371 PT Time Calculation (min) (ACUTE ONLY): 13 min   Charges:   PT Evaluation $PT Eval Low Complexity: 1 Low          Lou Miner, DPT  Acute Rehabilitation Services   Pager: [phone] Office: [phone]   Rudean Hitt 01/27/2020, 6:09 PM

## 2020-01-28 DIAGNOSIS — G912 (Idiopathic) normal pressure hydrocephalus: Secondary | ICD-10-CM

## 2020-01-28 DIAGNOSIS — G9341 Metabolic encephalopathy: Secondary | ICD-10-CM

## 2020-01-28 LAB — GLUCOSE, CAPILLARY
Glucose-Capillary: 159 mg/dL — ABNORMAL HIGH (ref 70–99)
Glucose-Capillary: 94 mg/dL (ref 70–99)
Glucose-Capillary: 182 mg/dL — ABNORMAL HIGH (ref 70–99)
Glucose-Capillary: 160 mg/dL — ABNORMAL HIGH (ref 70–99)

## 2020-01-28 LAB — BASIC METABOLIC PANEL
Anion gap: 12 (ref 5–15)
BUN: 27 mg/dL — ABNORMAL HIGH (ref 8–23)
CO2: 31 mmol/L (ref 22–32)
Calcium: 9.2 mg/dL (ref 8.9–10.3)
Chloride: 101 mmol/L (ref 98–111)
Creatinine, Ser: 2.33 mg/dL — ABNORMAL HIGH (ref 0.61–1.24)
GFR calc Af Amer: 30 mL/min — ABNORMAL LOW (ref >60)
GFR calc non Af Amer: 26 mL/min — ABNORMAL LOW (ref >60)
Glucose, Bld: 147 mg/dL — ABNORMAL HIGH (ref 70–99)
Potassium: 3.7 mmol/L (ref 3.5–5.1)
Sodium: 144 mmol/L (ref 135–145)

## 2020-01-28 LAB — CBC
HCT: 36.2 % — ABNORMAL LOW (ref 39.0–52.0)
Hemoglobin: 10.8 g/dL — ABNORMAL LOW (ref 13.0–17.0)
MCH: 25 pg — ABNORMAL LOW (ref 26.0–34.0)
MCHC: 29.8 g/dL — ABNORMAL LOW (ref 30.0–36.0)
MCV: 83.8 fL (ref 80.0–100.0)
Platelets: 134 10*3/uL — ABNORMAL LOW (ref 150–400)
RBC: 4.32 MIL/uL (ref 4.22–5.81)
RDW: 22.7 % — ABNORMAL HIGH (ref 11.5–15.5)
WBC: 6.1 10*3/uL (ref 4.0–10.5)
nRBC: 0 % (ref 0.0–0.2)

## 2020-01-28 LAB — AMMONIA: Ammonia: 25 umol/L (ref 9–35)

## 2020-01-28 LAB — MAGNESIUM: Magnesium: 2.3 mg/dL (ref 1.7–2.4)

## 2020-01-28 NOTE — TOC Progression Note (Signed)
Transition of Care [facility]) - Progression Note    Patient Details  Name: Justin Hill MRN: [id number] Date of Birth: [birth year]  Transition of Care [facility]) CM/SW [place], [place] Phone Number: [phone] 01/28/2020, 4:33 PM  Clinical Narrative:     CSW attempted to call [facility] and it went straight to voice mail. CSW left a message awaiting response.  TOC team will continue to assist with discharge planning needs.  Expected Discharge Plan: [place] Barriers to Discharge: Continued Medical Work up  Expected Discharge Plan and Services Expected Discharge Plan: [place] Choice: [place] arrangements for the past 2 months: Single Family Home                                       Social Determinants of Health (SDOH) Interventions    Readmission Risk Interventions No flowsheet data found.

## 2020-01-28 NOTE — TOC Progression Note (Signed)
Transition of Care [facility]) - Progression Note    Patient Details  Name: Justin Hill MRN: [id number] Date of Birth: 10/05/1941  Transition of Care [facility]) CM/SW [place], [place] Phone Number: 01/28/2020, 10:39 AM  Clinical Narrative:     CSW sent message to  [facility] to follow up on referral sent yesterday. CSW awaiting on response.  CSW will continue to follow and assist with discharge planning.  Thurmond Butts, MSW, [place] Clinical Social Worker   Expected Discharge Plan: Skilled Nursing Facility Barriers to Discharge: Continued Medical Work up  Expected Discharge Plan and Services Expected Discharge Plan: [place] Choice: [place] arrangements for the past 2 months: Single Family Home                                       Social Determinants of Health (SDOH) Interventions    Readmission Risk Interventions No flowsheet data found.

## 2020-01-28 NOTE — Progress Notes (Signed)
PROGRESS NOTE    Justin Hill  ELF:[id number] DOB: 09-05-1941 DOA: 01/24/2020 PCP: Justin Grammes, MD     Brief Narrative:  Justin Hill is a 79 y.o.WM PMHx chronic systolic CHF last EF measured was 55 to 60% on May 2020 (resolved),s/p ablation of atrial flutter Hx of A. fib on Xarelto, pacer, DM type II controlled with complication,  normal pressure hydrocephalus s/p VP shunt, CKD stage IIIb ( baseline Cr 1.8 ), OSA on CPAP was referred from the primary care office after patient has become increasingly short of breath.  Patient states over the last 1 week patient has become increasingly short of breath denies any chest pain productive cough fever chills.  Denies any nausea vomiting or diarrhea.  ED Course: In the ER chest x-ray does not show anything acute on exam patient has bilateral lower extremity edema elevated JVD BNP was 35.1 high-sensitivity troponin was 9.  CBC shows anemia and thrombocytopenia which is chronic.  Creatinine is increased to 2.1 with baseline around 1.8-1.9.  Patient was given Lasix 60 mg IV and admitted for acute CHF.  Covid test was negative.   Subjective: 3/6 A/O x4, sitting in chair comfortably   Assessment & Plan:   Principal Problem:   Acute CHF (congestive heart failure) (HCC) Active Problems:   Hypertension   CKD (chronic kidney disease) stage 3, GFR 30-59 ml/min   Essential hypertension   CAD- mild, non obstructive CAD 2008   Atrial flutter (HCC)   Acute systolic CHF (congestive heart failure) (HCC)   OSA on CPAP   Diabetes mellitus type 2, controlled, with complications (HCC)  Acute on Chronic systolic CHF (patient Base weight 113.3 kg) -Resolved on May 2020 echocardiogram EF 55 to 60% -Acute CHF?  Echocardiogram; not consistent with acute exacerbation see results below -Strict in and out -3.9 L -Daily weight Filed Weights   01/26/20 0318 01/27/20 0322 01/28/20 0554  Weight: 120.6 kg 121 kg 120.7 kg  -3/5 cardiology restarted  Torsemide 60 mg qAm/  40mg  qPm; will need to watch renal function closely -Entresto discontinued by cardiology -Coreg 25 mg BID -Patient's base weight of 113.3 kg most likely old base weight given patient's confusion do not believe patient has been weighing himself daily as he states and he has truly gained significant weight. -3/6 discussed case with Dr. Pierre Hill cardiology heart failure team, and from their standpoint patient can be discharged.  Hx LBBB/cardiomyopathy -S/p Medtronic CRT-D device placement  Paroxysmal atrial fibrillation -Continue Xarelto -See CHF -Currently NSR  HTN -See CHF  Acute on CKD stage IIIb (baseline Cr 1.85) Recent Labs  Lab 01/24/20 1414 01/25/20 0521 01/26/20 0236 01/27/20 0325 01/28/20 0523  CREATININE 2.46* 2.32* 2.70* 2.16* 2.33*  -See CHF  DM type II controlled with complication -5/85 hemoglobin A1c= 6.4 -NovoLog 70/30 125 units breakfast -NovoLog 70/30 15 units supper -Super sensitive SSI  Obese (BMI 37.55 kg/m) -Continue diabetic diet  OSA/OHS -CPAP per respiratory settings.  Patient to wear qhs or when napping during the day. -3/4 ABG; pH 7.4, PCO2 50.4, PO2 65.7.  Does not account for patient's increasing confusion.   Acute metabolic encephalopathy -Most likely multifactorial, acute on chronic systolic CHF, OSA/OHS,. -Appears to be clearing, patient much more alert and interactive.  Normal pressure hydrocephalus -S/p VP shunt placement  Chronic anemia (baseline HgB 11.4) Recent Labs  Lab 01/24/20 1414 01/25/20 0521 01/26/20 0806 01/27/20 0325 01/28/20 0523  HGB 11.4* 11.8* 11.9*  12.2* 11.5* 10.8*  -Anemia panel pending  Thrombocytopenia -Results for Justin Hill" (MRN [id number]) as of 01/27/2020 14:20  Ref. Range 01/24/2020 14:14 01/25/2020 05:21 01/26/2020 06:32 01/27/2020 03:25  Platelets Latest Ref Range: 150 - 400 K/uL 148 (L) PLATELET CLUMPS NOTED ON SMEAR, UNABLE TO ESTIMATE 145 (L) 147 (L)  -No  signs of overt bleeding. -Stable  Goals of care -3/6 PT/OT consult; PT recommends SNF will place consult for LCSW to begin looking for SNF    DVT prophylaxis: Xarelto Code Status: Full Family Communication: 3/4 attempted to call Justin Hill (son) received no answer at phone number given to RN.  Will attempt to call in a.m. Disposition Plan: TBD   Consultants:  Cardiology   Procedures/Significant Events:  3/4 Echocardiogram; LVEF; 55%.  -No regional wall  motion abnormalities.  -Left  ventricular diastolic parameters are  indeterminate.  3/4 RIGHT heart cath;Mildly elevated PCWP, normal RA pressure.  2. Mild pulmonary venous hypertension.  3. Normal cardiac output.    I have personally reviewed and interpreted all radiology studies and my findings are as above.  VENTILATOR SETTINGS:    Cultures 3/2 SARS coronavirus 2 negative    Antimicrobials:    Devices    LINES / TUBES:      Continuous Infusions:    Objective: Vitals:   01/27/20 1148 01/27/20 1714 01/27/20 2048 01/28/20 0554  BP: (!) 121/57 (!) 120/57 134/65 127/71  Pulse: 79 81 89 78  Resp: 18  18 18   Temp: 97.9 F (36.6 C)  98 F (36.7 C) 97.9 F (36.6 C)  TempSrc: Oral  Oral Oral  SpO2: 95% 90% 94% 92%  Weight:    120.7 kg  Height:        Intake/Output Summary (Last 24 hours) at 01/28/2020 0819 Last data filed at 01/28/2020 0550 Gross per 24 hour  Intake 240 ml  Output 1150 ml  Net -910 ml   Filed Weights   01/26/20 0318 01/27/20 0322 01/28/20 0554  Weight: 120.6 kg 121 kg 120.7 kg   Physical Exam:  General: A/O x4, no acute respiratory distress Eyes: negative scleral hemorrhage, negative anisocoria, negative icterus ENT: Negative Runny nose, negative gingival bleeding, Neck:  Negative scars, masses, torticollis, lymphadenopathy, JVD Lungs: Clear to auscultation bilaterally without wheezes or crackles Cardiovascular: Regular rate and rhythm without murmur gallop or rub normal S1 and  S2 Abdomen: OBESE, negative abdominal pain, nondistended, positive soft, bowel sounds, no rebound, no ascites, no appreciable mass Extremities: No significant cyanosis, clubbing, or edema bilateral lower extremities Skin: Negative rashes, lesions, ulcers Psychiatric:  Negative depression, negative anxiety, negative fatigue, negative mania  Central nervous system:  Cranial nerves II through XII intact, tongue/uvula midline, all extremities muscle strength 5/5, sensation intact throughout, negative dysarthria, negative expressive aphasia, negative receptive aphasia.  .     Data Reviewed: Care during the described time interval was provided by me .  I have reviewed this patient's available data, including medical history, events of note, physical examination, and all test results as part of my evaluation.  CBC: Recent Labs  Lab 01/24/20 1414 01/25/20 0521 01/26/20 9935 01/26/20 0806 01/27/20 0325 01/28/20 0523  WBC 5.7 7.8  --   --  6.8 6.1  NEUTROABS 3.8 5.6  --   --   --   --   HGB 11.4* 11.8*  --  11.9*  12.2* 11.5* 10.8*  HCT 38.3* 39.0  --  35.0*  36.0* 39.1 36.2*  MCV 85.7 83.9  --   --  84.4 83.8  PLT 148*  PLATELET CLUMPS NOTED ON SMEAR, UNABLE TO ESTIMATE 145*  --  147* 841*   Basic Metabolic Panel: Recent Labs  Lab 01/24/20 1414 01/24/20 1414 01/25/20 0521 01/26/20 0236 01/26/20 0806 01/27/20 0325 01/28/20 0523  NA 141   < > 144 146* 143  143 142 144  K 3.9   < > 4.1 3.8 3.7  3.7 4.3 3.7  CL 99  --  98 99  --  101 101  CO2 32  --  32 34*  --  32 31  GLUCOSE 105*  --  103* 100*  --  138* 147*  BUN 26*  --  22 22  --  26* 27*  CREATININE 2.46*  --  2.32* 2.70*  --  2.16* 2.33*  CALCIUM 9.3  --  9.6 9.5  --  9.6 9.2  MG  --   --  2.3 2.4  --  2.3 2.3   < > = values in this interval not displayed.   GFR: Estimated Creatinine Clearance: 35.7 mL/min (A) (by C-G formula based on SCr of 2.33 mg/dL (H)). Liver Function Tests: Recent Labs  Lab 01/25/20 0521    AST 19  ALT 15  ALKPHOS 36*  BILITOT 1.0  PROT 6.1*  ALBUMIN 3.3*   No results for input(s): LIPASE, AMYLASE in the last 168 hours. No results for input(s): AMMONIA in the last 168 hours. Coagulation Profile: No results for input(s): INR, PROTIME in the last 168 hours. Cardiac Enzymes: No results for input(s): CKTOTAL, CKMB, CKMBINDEX, TROPONINI in the last 168 hours. BNP (last 3 results) No results for input(s): PROBNP in the last 8760 hours. HbA1C: No results for input(s): HGBA1C in the last 72 hours. CBG: Recent Labs  Lab 01/27/20 1030 01/27/20 1143 01/27/20 1626 01/27/20 2122 01/28/20 0636  GLUCAP 160* 159* 164* 253* 159*   Lipid Profile: No results for input(s): CHOL, HDL, LDLCALC, TRIG, CHOLHDL, LDLDIRECT in the last 72 hours. Thyroid Function Tests: No results for input(s): TSH, T4TOTAL, FREET4, T3FREE, THYROIDAB in the last 72 hours. Anemia Panel: Recent Labs    01/27/20 0325  VITAMINB12 349  FOLATE 33.1  FERRITIN 91  TIBC 406  IRON 71  RETICCTPCT 1.8   Sepsis Labs: No results for input(s): PROCALCITON, LATICACIDVEN in the last 168 hours.  Recent Results (from the past 240 hour(s))  SARS CORONAVIRUS 2 (TAT 6-24 HRS) Nasopharyngeal Nasopharyngeal Swab     Status: None   Collection Time: 01/24/20  1:17 PM   Specimen: Nasopharyngeal Swab  Result Value Ref Range Status   SARS Coronavirus 2 NEGATIVE NEGATIVE Final    Comment: (NOTE) SARS-CoV-2 target nucleic acids are NOT DETECTED. The SARS-CoV-2 RNA is generally detectable in upper and lower respiratory specimens during the acute phase of infection. Negative results do not preclude SARS-CoV-2 infection, do not rule out co-infections with other pathogens, and should not be used as the sole basis for treatment or other patient management decisions. Negative results must be combined with clinical observations, patient history, and epidemiological information. The expected result is Negative. Fact Sheet  for Patients: SugarRoll.be Fact Sheet for Healthcare Providers: https://www.-mathews.com/ This test is not yet approved or cleared by the [place] FDA and  has been authorized for detection and/or diagnosis of SARS-CoV-2 by FDA under an Emergency Use Authorization (EUA). This EUA will remain  in effect (meaning this test can be used) for the duration of the COVID-19 declaration under Section 56 4(b)(1) of the Act, 21 U.S.C. section 360bbb-3(b)(1), unless the  authorization is terminated or revoked sooner. Performed at [facility], [place] [address], [place], [place] [address]          Radiology Studies: No results found.      Scheduled Meds: . vitamin C  1,000 mg Oral BID  . carvedilol  25 mg Oral BID WC  . cholecalciferol  1,000 Units Oral Daily  . fenofibrate  54 mg Oral Daily  . fluticasone furoate-vilanterol  1 puff Inhalation Daily  . insulin aspart  0-6 Units Subcutaneous TID WC  . insulin aspart protamine- aspart  125 Units Subcutaneous Q breakfast  . insulin aspart protamine- aspart  15 Units Subcutaneous Q supper  . loratadine  10 mg Oral BID  . multivitamin with minerals  1 tablet Oral Daily  . pantoprazole  40 mg Oral Daily  . Rivaroxaban  15 mg Oral Q supper  . simvastatin  40 mg Oral QHS  . sodium chloride flush  3 mL Intravenous Q12H  . tamsulosin  0.4 mg Oral Daily  . torsemide  40 mg Oral QPM  . torsemide  60 mg Oral Q breakfast  . umeclidinium bromide  1 puff Inhalation Daily   Continuous Infusions:    LOS: 4 days    Time spent:40 min    Toa Mia, Geraldo Docker, MD [facility] Pager [phone]  If 7PM-7AM, please contact night-coverage [email or web address] Password [facility] 01/28/2020, 8:19 AM

## 2020-01-28 NOTE — Plan of Care (Signed)
Patient resting in bed. Denies any pain or needs at this time. Patient is oriented x4 at this time. Visualized patient ambulating and there are no deficits. Patient refused to have bed alarm set. Will continue to monitor.   Problem: Education: Goal: Knowledge of General Education information will improve Description: Including pain rating scale, medication(s)/side effects and non-pharmacologic comfort measures Outcome: Progressing   Problem: Health Behavior/Discharge Planning: Goal: Ability to manage health-related needs will improve Outcome: Progressing   Problem: Clinical Measurements: Goal: Ability to maintain clinical measurements within normal limits will improve Outcome: Progressing Goal: Will remain free from infection Outcome: Progressing Goal: Diagnostic test results will improve Outcome: Progressing Goal: Respiratory complications will improve Outcome: Progressing Goal: Cardiovascular complication will be avoided Outcome: Progressing   Problem: Activity: Goal: Risk for activity intolerance will decrease Outcome: Progressing   Problem: Coping: Goal: Level of anxiety will decrease Outcome: Progressing   Problem: Pain Managment: Goal: General experience of comfort will improve Outcome: Progressing   Problem: Safety: Goal: Ability to remain free from injury will improve Outcome: Progressing   Problem: Skin Integrity: Goal: Risk for impaired skin integrity will decrease Outcome: Progressing   Problem: Education: Goal: Ability to demonstrate management of disease process will improve Outcome: Progressing Goal: Ability to verbalize understanding of medication therapies will improve Outcome: Progressing Goal: Individualized Educational Video(s) Outcome: Progressing   Problem: Activity: Goal: Capacity to carry out activities will improve Outcome: Progressing   Problem: Cardiac: Goal: Ability to achieve and maintain adequate cardiopulmonary perfusion will  improve Outcome: Progressing

## 2020-01-28 NOTE — Evaluation (Addendum)
Occupational Therapy Evaluation Patient Details Name: Justin Hill MRN: [id number] DOB: August 20, 1941 Today's Date: 01/28/2020    History of Present Illness Pt is a 79 y/o male admitted secondary to increased SOB. Thought to be secondary to CHF exacerbation. Pt is also s/p R heart cath. PMH includes CKD, HTN, CAD, CHF, OSA on CPAP, and DM.    Clinical Impression   PTA pt living at home with spouse, unsure of exact PLOF given cognitive status. Per chart review wife was main caregiver, and is also currently admitted. Suspect cognitive deficits at baseline, but presented this date as listed below. At time of eval, pt able to complete bed mobility with min A and sit <> strand with min A. Pt able to complete functional mobility to bathroom and stand to urinate. He required cues to sequence standing grooming task. He then completed functional mobility a household distance without AD due to increased difficulty navigating RW. Pt with poor balance, safety awareness, and endurance. DOE 2/4 noted with minimal activity. Recommend SNF for safe d/c and BADL progression due to lack of caregiver support in the home at this time. Will continue to follow per POC listed below.     Follow Up Recommendations  SNF;Supervision/Assistance - 24 hour    Equipment Recommendations  None recommended by OT    Recommendations for Other Services       Precautions / Restrictions Precautions Precautions: Fall Restrictions Weight Bearing Restrictions: No      Mobility Bed Mobility Overal bed mobility: Needs Assistance Bed Mobility: Supine to Sit;Sit to Supine     Supine to sit: Min assist     General bed mobility comments: min A to rise at trunk and sit upright at EOB  Transfers Overall transfer level: Needs assistance Equipment used: 1 person hand held assist Transfers: Sit to/from Stand Sit to Stand: Min assist         General transfer comment: min A to rise and steady, initially using RW but then  progressed without. Pt had difficulty managing and navigating with RW    Balance Overall balance assessment: Needs assistance Sitting-balance support: No upper extremity supported;Feet supported Sitting balance-Leahy Scale: Good     Standing balance support: Single extremity supported;During functional activity Standing balance-Leahy Scale: Poor Standing balance comment: Reliant on at least 1 UE support                            ADL either performed or assessed with clinical judgement   ADL Overall ADL's : Needs assistance/impaired Eating/Feeding: Set up;Sitting   Grooming: Min guard;Standing;Wash/dry hands   Upper Body Bathing: Set up;Sitting   Lower Body Bathing: Maximal assistance;Sit to/from stand;Sitting/lateral leans   Upper Body Dressing : Set up;Sitting   Lower Body Dressing: Maximal assistance;Sit to/from stand;Sitting/lateral leans Lower Body Dressing Details (indicate cue type and reason): to don socks, states he cannot bed over due to body habitus; cannot cross legs Toilet Transfer: Nature conservation officer;Ambulation Toilet Transfer Details (indicate cue type and reason): stood to urinate in toilet in bathroom in room Toileting- Clothing Manipulation and Hygiene: Set up;Sitting/lateral lean;Sit to/from stand   Tub/ Shower Transfer: Minimal assistance;Ambulation;Shower seat   Functional mobility during ADLs: Min guard;Cueing for safety;Cueing for sequencing General ADL Comments: presents with poor balance, safety awareness, and decreased activity tolerance     Vision Baseline Vision/History: Wears glasses Wears Glasses: At all times Patient Visual Report: No change from baseline  Perception     Praxis      Pertinent Vitals/Pain Pain Assessment: No/denies pain     Hand Dominance     Extremity/Trunk Assessment Upper Extremity Assessment Upper Extremity Assessment: Generalized weakness   Lower Extremity Assessment Lower Extremity  Assessment: Defer to PT evaluation       Communication Communication Communication: No difficulties   Cognition Arousal/Alertness: Awake/alert Behavior During Therapy: Flat affect Overall Cognitive Status: Impaired/Different from baseline Area of Impairment: Memory;Problem solving;Awareness;Safety/judgement                     Memory: Decreased short-term memory;Decreased recall of precautions   Safety/Judgement: Decreased awareness of safety;Decreased awareness of deficits Awareness: Emergent Problem Solving: Slow processing General Comments: Pt presenting with poor recall with short term situations. Also presents with overall decrease in safety and awareness to current condition   General Comments       Exercises     Shoulder Instructions      Home Living Family/patient expects to be discharged to:: Skilled nursing facility                                 Additional Comments: Per RN, pt does not have his primary caregiver at home as she is in the hospital.       Prior Functioning/Environment Level of Independence: Independent        Comments: Pt reports independence with mobility. Unsure of accuracy given cognitive deficits.         OT Problem List: Decreased strength;Decreased knowledge of use of DME or AE;Decreased activity tolerance;Decreased cognition;Cardiopulmonary status limiting activity;Impaired balance (sitting and/or standing);Decreased safety awareness      OT Treatment/Interventions: Self-care/ADL training;Therapeutic exercise;Patient/family education;Balance training;Energy conservation;Therapeutic activities;DME and/or AE instruction;Cognitive remediation/compensation    OT Goals(Current goals can be found in the care plan section) Acute Rehab OT Goals Patient Stated Goal: to go home OT Goal Formulation: With patient Time For Goal Achievement: 02/11/20 Potential to Achieve Goals: Good  OT Frequency: Min 2X/week   Barriers  to D/C:            Co-evaluation              AM-PAC OT "6 Clicks" Daily Activity     Outcome Measure Help from another person eating meals?: None Help from another person taking care of personal grooming?: None Help from another person toileting, which includes using toliet, bedpan, or urinal?: A Little Help from another person bathing (including washing, rinsing, drying)?: A Lot Help from another person to put on and taking off regular upper body clothing?: None Help from another person to put on and taking off regular lower body clothing?: A Lot 6 Click Score: 19   End of Session Equipment Utilized During Treatment: Gait belt Nurse Communication: Mobility status  Activity Tolerance: Patient tolerated treatment well Patient left: in chair;with call bell/phone within reach;with chair alarm set  OT Visit Diagnosis: Unsteadiness on feet (R26.81);Other abnormalities of gait and mobility (R26.89);Muscle weakness (generalized) (M62.81);Other symptoms and signs involving cognitive function                Time: 1400-1412 OT Time Calculation (min): 12 min Charges:  OT General Charges $OT Visit: 1 Visit OT Evaluation $OT Eval Moderate Complexity: [place], MSOT, OTR/L Acute Rehabilitation Services [facility] Office Number: [phone] Pager: [phone]  Zenovia Jarred 01/28/2020, 3:39 PM

## 2020-01-29 LAB — GLUCOSE, CAPILLARY
Glucose-Capillary: 137 mg/dL — ABNORMAL HIGH (ref 70–99)
Glucose-Capillary: 209 mg/dL — ABNORMAL HIGH (ref 70–99)
Glucose-Capillary: 196 mg/dL — ABNORMAL HIGH (ref 70–99)
Glucose-Capillary: 207 mg/dL — ABNORMAL HIGH (ref 70–99)

## 2020-01-29 LAB — SARS CORONAVIRUS 2 (TAT 6-24 HRS): SARS Coronavirus 2: NEGATIVE

## 2020-01-29 LAB — CBC
HCT: 37 % — ABNORMAL LOW (ref 39.0–52.0)
Hemoglobin: 11.2 g/dL — ABNORMAL LOW (ref 13.0–17.0)
MCH: 25.3 pg — ABNORMAL LOW (ref 26.0–34.0)
MCHC: 30.3 g/dL (ref 30.0–36.0)
MCV: 83.5 fL (ref 80.0–100.0)
Platelets: 144 10*3/uL — ABNORMAL LOW (ref 150–400)
RBC: 4.43 MIL/uL (ref 4.22–5.81)
RDW: 22.9 % — ABNORMAL HIGH (ref 11.5–15.5)
WBC: 7.6 10*3/uL (ref 4.0–10.5)
nRBC: 0 % (ref 0.0–0.2)

## 2020-01-29 LAB — MAGNESIUM: Magnesium: 2.1 mg/dL (ref 1.7–2.4)

## 2020-01-29 LAB — BASIC METABOLIC PANEL
Anion gap: 13 (ref 5–15)
BUN: 32 mg/dL — ABNORMAL HIGH (ref 8–23)
CO2: 32 mmol/L (ref 22–32)
Calcium: 9 mg/dL (ref 8.9–10.3)
Chloride: 100 mmol/L (ref 98–111)
Creatinine, Ser: 2.57 mg/dL — ABNORMAL HIGH (ref 0.61–1.24)
GFR calc Af Amer: 27 mL/min — ABNORMAL LOW (ref >60)
GFR calc non Af Amer: 23 mL/min — ABNORMAL LOW (ref >60)
Glucose, Bld: 112 mg/dL — ABNORMAL HIGH (ref 70–99)
Potassium: 3.6 mmol/L (ref 3.5–5.1)
Sodium: 145 mmol/L (ref 135–145)

## 2020-01-29 MED ORDER — SODIUM CHLORIDE 0.45 % IV SOLN: INTRAVENOUS | Status: AC

## 2020-01-29 NOTE — TOC Progression Note (Signed)
Transition of Care [facility]) - Progression Note    Patient Details  Name: Justin Hill MRN: [id number] Date of Birth: 1941-02-02  Transition of Care [facility]) CM/SW [place], [place] Phone Number: 01/29/2020, 12:56 PM  Clinical Narrative:     [facility]- called and confirmed bed offer. [facility] states they are unable to admit until Monday (they need to order C-Pap).   Patient needs covid test before discharge to SNF.   Thurmond Butts, MSW, [place] Clinical Social Worker   Expected Discharge Plan: Skilled Nursing Facility Barriers to Discharge: Continued Medical Work up  Expected Discharge Plan and Services Expected Discharge Plan: [place] Choice: [place] arrangements for the past 2 months: Single Family Home                                       Social Determinants of Health (SDOH) Interventions    Readmission Risk Interventions No flowsheet data found.

## 2020-01-29 NOTE — Progress Notes (Signed)
PROGRESS NOTE    Justin Hill  HDQ:[id number] DOB: Dec 27, 1940 DOA: 01/24/2020 PCP: Serita Grammes, MD     Brief Narrative:  Justin Hill is a 79 y.o.WM PMHx chronic systolic CHF last EF measured was 55 to 60% on May 2020 (resolved),s/p ablation of atrial flutter Hx of A. fib on Xarelto, pacer, DM type II controlled with complication,  normal pressure hydrocephalus s/p VP shunt, CKD stage IIIb ( baseline Cr 1.8 ), OSA on CPAP was referred from the primary care office after patient has become increasingly short of breath.  Patient states over the last 1 week patient has become increasingly short of breath denies any chest pain productive cough fever chills.  Denies any nausea vomiting or diarrhea.  ED Course: In the ER chest x-ray does not show anything acute on exam patient has bilateral lower extremity edema elevated JVD BNP was 35.1 high-sensitivity troponin was 9.  CBC shows anemia and thrombocytopenia which is chronic.  Creatinine is increased to 2.1 with baseline around 1.8-1.9.  Patient was given Lasix 60 mg IV and admitted for acute CHF.  Covid test was negative.   Subjective: 3/7A/O x4, sitting in chair talking on the phone.  Assessment & Plan:   Principal Problem:   Acute CHF (congestive heart failure) (HCC) Active Problems:   Hypertension   CKD (chronic kidney disease) stage 3, GFR 30-59 ml/min   Essential hypertension   CAD- mild, non obstructive CAD 2008   Atrial flutter (HCC)   Acute systolic CHF (congestive heart failure) (HCC)   OSA on CPAP   Diabetes mellitus type 2, controlled, with complications (HCC)  Acute on Chronic systolic CHF (patient Base weight 113.3 kg) -Resolved on May 2020 echocardiogram EF 55 to 60% -Acute CHF?  Echocardiogram; not consistent with acute exacerbation see results below -Strict in and out -6.9 L -Daily weight Filed Weights   01/27/20 0322 01/28/20 0554 01/29/20 0608  Weight: 121 kg 120.7 kg 120.4 kg  -3/5 cardiology  restarted Torsemide 60 mg qAm/  40mg  qPm; will need to watch renal function closely -Entresto discontinued by cardiology -Coreg 25 mg BID -Patient's base weight of 113.3 kg most likely old base weight given patient's confusion do not believe patient has been weighing himself daily as he states and he has truly gained significant weight. -3/6 discussed case with Dr. Pierre Bali cardiology heart failure team, and from their standpoint patient can be discharged. -3/7 continued worsening renal function (hold) Torsemide 60 mg qAm/  40mg  qPm; bolus 0.45% saline 667ml  Hx LBBB/cardiomyopathy -S/p Medtronic CRT-D device placement  Paroxysmal atrial fibrillation -Continue Xarelto -See CHF -Currently NSR  HTN -See CHF  Acute on CKD stage IIIb (baseline Cr 1.85) Recent Labs  Lab 01/25/20 0521 01/26/20 0236 01/27/20 0325 01/28/20 0523 01/29/20 0336  CREATININE 2.32* 2.70* 2.16* 2.33* 2.57*  -See CHF  DM type II controlled with complication -1/19 hemoglobin A1c= 6.4 -NovoLog 70/30 125 units breakfast -NovoLog 70/30 15 units supper -3/7 discontinue super sensitive SSI  Obese (BMI 37.55 kg/m) -Continue diabetic diet  OSA/OHS -CPAP per respiratory settings.  Patient to wear qhs or when napping during the day. -3/4 ABG; pH 7.4, PCO2 50.4, PO2 65.7.  Does not account for patient's increasing confusion.   Acute metabolic encephalopathy -Most likely multifactorial, acute on chronic systolic CHF, OSA/OHS,. -Appears to be clearing, patient much more alert and interactive.  Normal pressure hydrocephalus -S/p VP shunt placement  Chronic anemia (baseline HgB 11.4) Recent Labs  Lab 01/25/20 0521 01/26/20  3299 01/27/20 0325 01/28/20 0523 01/29/20 0336  HGB 11.8* 11.9*  12.2* 11.5* 10.8* 11.2*  -Anemia panel pending  Thrombocytopenia -Results for WYNDELL, CARDIFF" (MRN [id number]) as of 01/27/2020 14:20  Ref. Range 01/24/2020 14:14 01/25/2020 05:21 01/26/2020 06:32 01/27/2020 03:25   Platelets Latest Ref Range: 150 - 400 K/uL 148 (L) PLATELET CLUMPS NOTED ON SMEAR, UNABLE TO ESTIMATE 145 (L) 147 (L)  -No signs of overt bleeding. -Stable  Goals of care -3/6 PT/OT consult; PT recommends SNF will place consult for LCSW to begin looking for SNF -3/7 obtain repeat Covid per SNF request.  Patient could go as early as 3/8 if Covid test returns and patient's Cr trending down again.    DVT prophylaxis: Xarelto Code Status: Full Family Communication: 3/4 attempted to call Sherren Mocha (son) received no answer at phone number given to RN.  Will attempt to call in a.m. Disposition Plan: Awaiting [facility]   Consultants:  Cardiology   Procedures/Significant Events:  3/4 Echocardiogram; LVEF; 55%.  -No regional wall  motion abnormalities.  -Left  ventricular diastolic parameters are  indeterminate.  3/4 RIGHT heart cath;Mildly elevated PCWP, normal RA pressure.  2. Mild pulmonary venous hypertension.  3. Normal cardiac output.    I have personally reviewed and interpreted all radiology studies and my findings are as above.  VENTILATOR SETTINGS:    Cultures 3/2 SARS coronavirus 2 negative 3/7 SARS coronavirus 2 pending    Antimicrobials:    Devices    LINES / TUBES:      Continuous Infusions:    Objective: Vitals:   01/28/20 1953 01/29/20 0000 01/29/20 0608 01/29/20 0845  BP: (!) 103/53 123/63 136/63   Pulse: 94 89 81 76  Resp: 16 18 18 20   Temp: 98.1 F (36.7 C)  97.8 F (36.6 C)   TempSrc: Oral  Oral   SpO2: 91% 97% 92% 92%  Weight:   120.4 kg   Height:        Intake/Output Summary (Last 24 hours) at 01/29/2020 1227 Last data filed at 01/29/2020 1044 Gross per 24 hour  Intake 900 ml  Output 2125 ml  Net -1225 ml   Filed Weights   01/27/20 0322 01/28/20 0554 01/29/20 0608  Weight: 121 kg 120.7 kg 120.4 kg   Physical Exam:  General: A/O x4, no acute respiratory distress Eyes: negative scleral hemorrhage, negative anisocoria, negative  icterus ENT: Negative Runny nose, negative gingival bleeding, Neck:  Negative scars, masses, torticollis, lymphadenopathy, JVD Lungs: Clear to auscultation bilaterally without wheezes or crackles Cardiovascular: Regular rate and rhythm without murmur gallop or rub normal S1 and S2 Abdomen: OBESE, negative abdominal pain, nondistended, positive soft, bowel sounds, no rebound, no ascites, no appreciable mass Extremities: No significant cyanosis, clubbing, or edema bilateral lower extremities Skin: Negative rashes, lesions, ulcers Psychiatric:  Negative depression, negative anxiety, negative fatigue, negative mania  Central nervous system:  Cranial nerves II through XII intact, tongue/uvula midline, all extremities muscle strength 5/5, sensation intact throughout, negative dysarthria, negative expressive aphasia, negative receptive aphasia.  .     Data Reviewed: Care during the described time interval was provided by me .  I have reviewed this patient's available data, including medical history, events of note, physical examination, and all test results as part of my evaluation.  CBC: Recent Labs  Lab 01/24/20 1414 01/24/20 1414 01/25/20 0521 01/26/20 6222 01/26/20 0806 01/27/20 0325 01/28/20 0523 01/29/20 0336  WBC 5.7  --  7.8  --   --  6.8 6.1  7.6  NEUTROABS 3.8  --  5.6  --   --   --   --   --   HGB 11.4*   < > 11.8*  --  11.9*  12.2* 11.5* 10.8* 11.2*  HCT 38.3*   < > 39.0  --  35.0*  36.0* 39.1 36.2* 37.0*  MCV 85.7  --  83.9  --   --  84.4 83.8 83.5  PLT 148*   < > PLATELET CLUMPS NOTED ON SMEAR, UNABLE TO ESTIMATE 145*  --  147* 134* 144*   < > = values in this interval not displayed.   Basic Metabolic Panel: Recent Labs  Lab 01/25/20 0521 01/25/20 0521 01/26/20 0236 01/26/20 0806 01/27/20 0325 01/28/20 0523 01/29/20 0336  NA 144   < > 146* 143  143 142 144 145  K 4.1   < > 3.8 3.7  3.7 4.3 3.7 3.6  CL 98  --  99  --  101 101 100  CO2 32  --  34*  --  32 31  32  GLUCOSE 103*  --  100*  --  138* 147* 112*  BUN 22  --  22  --  26* 27* 32*  CREATININE 2.32*  --  2.70*  --  2.16* 2.33* 2.57*  CALCIUM 9.6  --  9.5  --  9.6 9.2 9.0  MG 2.3  --  2.4  --  2.3 2.3 2.1   < > = values in this interval not displayed.   GFR: Estimated Creatinine Clearance: 32.3 mL/min (A) (by C-G formula based on SCr of 2.57 mg/dL (H)). Liver Function Tests: Recent Labs  Lab 01/25/20 0521  AST 19  ALT 15  ALKPHOS 36*  BILITOT 1.0  PROT 6.1*  ALBUMIN 3.3*   No results for input(s): LIPASE, AMYLASE in the last 168 hours. Recent Labs  Lab 01/28/20 0818  AMMONIA 25   Coagulation Profile: No results for input(s): INR, PROTIME in the last 168 hours. Cardiac Enzymes: No results for input(s): CKTOTAL, CKMB, CKMBINDEX, TROPONINI in the last 168 hours. BNP (last 3 results) No results for input(s): PROBNP in the last 8760 hours. HbA1C: No results for input(s): HGBA1C in the last 72 hours. CBG: Recent Labs  Lab 01/28/20 1117 01/28/20 1629 01/28/20 2126 01/29/20 0609 01/29/20 1131  GLUCAP 94 182* 160* 137* 209*   Lipid Profile: No results for input(s): CHOL, HDL, LDLCALC, TRIG, CHOLHDL, LDLDIRECT in the last 72 hours. Thyroid Function Tests: No results for input(s): TSH, T4TOTAL, FREET4, T3FREE, THYROIDAB in the last 72 hours. Anemia Panel: Recent Labs    01/27/20 0325  VITAMINB12 349  FOLATE 33.1  FERRITIN 91  TIBC 406  IRON 71  RETICCTPCT 1.8   Sepsis Labs: No results for input(s): PROCALCITON, LATICACIDVEN in the last 168 hours.  Recent Results (from the past 240 hour(s))  SARS CORONAVIRUS 2 (TAT 6-24 HRS) Nasopharyngeal Nasopharyngeal Swab     Status: None   Collection Time: 01/24/20  1:17 PM   Specimen: Nasopharyngeal Swab  Result Value Ref Range Status   SARS Coronavirus 2 NEGATIVE NEGATIVE Final    Comment: (NOTE) SARS-CoV-2 target nucleic acids are NOT DETECTED. The SARS-CoV-2 RNA is generally detectable in upper and  lower respiratory specimens during the acute phase of infection. Negative results do not preclude SARS-CoV-2 infection, do not rule out co-infections with other pathogens, and should not be used as the sole basis for treatment or other patient management decisions. Negative results must be  combined with clinical observations, patient history, and epidemiological information. The expected result is Negative. Fact Sheet for Patients: SugarRoll.be Fact Sheet for Healthcare Providers: https://www.Dianne Whelchel-mathews.com/ This test is not yet approved or cleared by the [place] FDA and  has been authorized for detection and/or diagnosis of SARS-CoV-2 by FDA under an Emergency Use Authorization (EUA). This EUA will remain  in effect (meaning this test can be used) for the duration of the COVID-19 declaration under Section 56 4(b)(1) of the Act, 21 U.S.C. section 360bbb-3(b)(1), unless the authorization is terminated or revoked sooner. Performed at [facility], [place] [address] [place], [place] [address]          Radiology Studies: No results found.      Scheduled Meds: . vitamin C  1,000 mg Oral BID  . carvedilol  25 mg Oral BID WC  . cholecalciferol  1,000 Units Oral Daily  . fenofibrate  54 mg Oral Daily  . fluticasone furoate-vilanterol  1 puff Inhalation Daily  . insulin aspart  0-6 Units Subcutaneous TID WC  . insulin aspart protamine- aspart  125 Units Subcutaneous Q breakfast  . insulin aspart protamine- aspart  15 Units Subcutaneous Q supper  . loratadine  10 mg Oral BID  . multivitamin with minerals  1 tablet Oral Daily  . pantoprazole  40 mg Oral Daily  . Rivaroxaban  15 mg Oral Q supper  . simvastatin  40 mg Oral QHS  . sodium chloride flush  3 mL Intravenous Q12H  . tamsulosin  0.4 mg Oral Daily  . torsemide  40 mg Oral QPM  . torsemide  60 mg Oral Q breakfast  . umeclidinium bromide  1 puff Inhalation Daily    Continuous Infusions:    LOS: 5 days    Time spent:40 min    Caitriona Sundquist, Geraldo Docker, MD [facility] Pager [phone]  If 7PM-7AM, please contact night-coverage [email or web address] Password [facility] 01/29/2020, 12:27 PM

## 2020-01-29 NOTE — Progress Notes (Signed)
Patient wears CPAP at home, refuses to wear CPAP in hospital since admission.

## 2020-01-30 DIAGNOSIS — I480 Paroxysmal atrial fibrillation: Secondary | ICD-10-CM

## 2020-01-30 DIAGNOSIS — N170 Acute kidney failure with tubular necrosis: Secondary | ICD-10-CM

## 2020-01-30 DIAGNOSIS — I5033 Acute on chronic diastolic (congestive) heart failure: Secondary | ICD-10-CM | POA: Diagnosis present

## 2020-01-30 LAB — CBC
HCT: 35.5 % — ABNORMAL LOW (ref 39.0–52.0)
Hemoglobin: 10.8 g/dL — ABNORMAL LOW (ref 13.0–17.0)
MCH: 25.2 pg — ABNORMAL LOW (ref 26.0–34.0)
MCHC: 30.4 g/dL (ref 30.0–36.0)
MCV: 82.8 fL (ref 80.0–100.0)
Platelets: 136 10*3/uL — ABNORMAL LOW (ref 150–400)
RBC: 4.29 MIL/uL (ref 4.22–5.81)
RDW: 22.3 % — ABNORMAL HIGH (ref 11.5–15.5)
WBC: 7 10*3/uL (ref 4.0–10.5)
nRBC: 0 % (ref 0.0–0.2)

## 2020-01-30 LAB — BASIC METABOLIC PANEL
Anion gap: 11 (ref 5–15)
BUN: 36 mg/dL — ABNORMAL HIGH (ref 8–23)
CO2: 32 mmol/L (ref 22–32)
Calcium: 9.1 mg/dL (ref 8.9–10.3)
Chloride: 98 mmol/L (ref 98–111)
Creatinine, Ser: 2.83 mg/dL — ABNORMAL HIGH (ref 0.61–1.24)
GFR calc Af Amer: 24 mL/min — ABNORMAL LOW (ref >60)
GFR calc non Af Amer: 21 mL/min — ABNORMAL LOW (ref >60)
Glucose, Bld: 187 mg/dL — ABNORMAL HIGH (ref 70–99)
Potassium: 3.7 mmol/L (ref 3.5–5.1)
Sodium: 141 mmol/L (ref 135–145)

## 2020-01-30 LAB — GLUCOSE, CAPILLARY
Glucose-Capillary: 109 mg/dL — ABNORMAL HIGH (ref 70–99)
Glucose-Capillary: 141 mg/dL — ABNORMAL HIGH (ref 70–99)
Glucose-Capillary: 152 mg/dL — ABNORMAL HIGH (ref 70–99)
Glucose-Capillary: 153 mg/dL — ABNORMAL HIGH (ref 70–99)
Glucose-Capillary: 169 mg/dL — ABNORMAL HIGH (ref 70–99)
Glucose-Capillary: 171 mg/dL — ABNORMAL HIGH (ref 70–99)
Glucose-Capillary: 207 mg/dL — ABNORMAL HIGH (ref 70–99)

## 2020-01-30 LAB — MAGNESIUM: Magnesium: 2 mg/dL (ref 1.7–2.4)

## 2020-01-30 MED ORDER — SODIUM CHLORIDE 0.45 % IV SOLN: INTRAVENOUS | Status: DC

## 2020-01-30 MED ORDER — DIPHENHYDRAMINE HCL 25 MG PO CAPS
25.0000 mg | ORAL_CAPSULE | Freq: Every evening | ORAL | Status: DC | PRN
  Administered 2020-01-30 – 2020-01-31 (×2): 25 mg via ORAL
  Filled 2020-01-30 (×2): qty 1

## 2020-01-30 NOTE — Progress Notes (Signed)
CBG 141 (glucometer result did not transfer)

## 2020-01-30 NOTE — Progress Notes (Addendum)
Patient ID: Justin Hill, male   DOB: [birth year], 79 y.o.   MRN: [id number]     Advanced Heart Failure Rounding Note  PCP-Cardiologist: Loralie Champagne, MD   Subjective:   Discharge held due to elevated creatinine. Yesterday he received 600 ms NS. Diuretics held.   Creatinine trending up 2.3>2.5>2.8   Denies SOB. Denies dizziness    Objective:   Weight Range: 121.5 kg Body mass index is 37.36 kg/m.   Vital Signs:   Temp:  [97.8 F (36.6 C)-98.3 F (36.8 C)] 97.8 F (36.6 C) (03/08 0516) Pulse Rate:  [76-84] 84 (03/08 0516) Resp:  [18-20] 18 (03/08 0516) BP: (109-148)/(43-63) 109/43 (03/08 0516) SpO2:  [89 %-92 %] 89 % (03/08 0516) Weight:  [121.5 kg] 121.5 kg (03/08 0516) Last BM Date: 01/28/20  Weight change: Filed Weights   01/28/20 0554 01/29/20 0608 01/30/20 0516  Weight: 120.7 kg 120.4 kg 121.5 kg    Intake/Output:   Intake/Output Summary (Last 24 hours) at 01/30/2020 0744 Last data filed at 01/30/2020 0513 Gross per 24 hour  Intake 870 ml  Output 1925 ml  Net -1055 ml      Physical Exam   General:  Sitting on the side of the bed. No resp difficulty HEENT: normal Neck: supple. no JVD. Carotids 2+ bilat; no bruits. No lymphadenopathy or thryomegaly appreciated. Cor: PMI nondisplaced. Regular rate & rhythm. No rubs, gallops or murmurs. Lungs: clear Abdomen: soft, nontender, nondistended. No hepatosplenomegaly. No bruits or masses. Good bowel sounds. Extremities: no cyanosis, clubbing, rash, edema Neuro: alert & orientedx3, cranial nerves grossly intact. moves all 4 extremities w/o difficulty. Affect pleasant   Telemetry  NSR Biv pacing  80s   Labs    CBC Recent Labs    01/29/20 0336 01/30/20 0411  WBC 7.6 7.0  HGB 11.2* 10.8*  HCT 37.0* 35.5*  MCV 83.5 82.8  PLT 144* 741*   Basic Metabolic Panel Recent Labs    01/29/20 0336 01/30/20 0411  NA 145 141  K 3.6 3.7  CL 100 98  CO2 32 32  GLUCOSE 112* 187*  BUN 32* 36*  CREATININE  2.57* 2.83*  CALCIUM 9.0 9.1  MG 2.1 2.0   Liver Function Tests No results for input(s): AST, ALT, ALKPHOS, BILITOT, PROT, ALBUMIN in the last 72 hours. No results for input(s): LIPASE, AMYLASE in the last 72 hours. Cardiac Enzymes No results for input(s): CKTOTAL, CKMB, CKMBINDEX, TROPONINI in the last 72 hours.  BNP: BNP (last 3 results) Recent Labs    01/24/20 1414  BNP 35.1    ProBNP (last 3 results) No results for input(s): PROBNP in the last 8760 hours.   D-Dimer No results for input(s): DDIMER in the last 72 hours. Hemoglobin A1C No results for input(s): HGBA1C in the last 72 hours. Fasting Lipid Panel No results for input(s): CHOL, HDL, LDLCALC, TRIG, CHOLHDL, LDLDIRECT in the last 72 hours. Thyroid Function Tests No results for input(s): TSH, T4TOTAL, T3FREE, THYROIDAB in the last 72 hours.  Invalid input(s): FREET3  Other results:   Imaging    No results found.   Medications:     Scheduled Medications: . vitamin C  1,000 mg Oral BID  . carvedilol  25 mg Oral BID WC  . cholecalciferol  1,000 Units Oral Daily  . fenofibrate  54 mg Oral Daily  . fluticasone furoate-vilanterol  1 puff Inhalation Daily  . insulin aspart protamine- aspart  125 Units Subcutaneous Q breakfast  . insulin aspart protamine- aspart  15 Units Subcutaneous Q supper  . loratadine  10 mg Oral BID  . multivitamin with minerals  1 tablet Oral Daily  . pantoprazole  40 mg Oral Daily  . Rivaroxaban  15 mg Oral Q supper  . simvastatin  40 mg Oral QHS  . sodium chloride flush  3 mL Intravenous Q12H  . tamsulosin  0.4 mg Oral Daily  . umeclidinium bromide  1 puff Inhalation Daily    Infusions:   PRN Medications: acetaminophen **OR** acetaminophen, ondansetron **OR** ondansetron (ZOFRAN) IV  Assessment/Plan   1. Acute on chronic diastolic CHF: Prior nonischemic cardiomyopathy.  Low EF x years. Echo 6/16 showed EF 25-30% with septal-lateral dyssynchrony.  He says that he was  told in the past that his cardiomyopathy may have been related to viral myocarditis.  Interestingly, it also appears that he has a long-standing LBBB.  A chronic LBBB itself can potentially cause a cardiomyopathy and may be the source of his low EF.   Coronary angiography in 6/17 with nonobstructive disease. Now s/p Medtronic CRT-D device placement.  Echo in 5/20 showed EF normalized, 55-60%.  Echo this admission showed EF still 55%.   He was admitted this time for persistent dyspnea despite increasing outpatient diuretics.  He got IV Lasix here,  weight down but developed AKI on CKD with creatinine up to 2.7.  [place] 01/26/20 showed mildly elevated PCWP and normal RA pressure. - Creatinine continues to rise.  - Diuretics/entresto stopped with AKI. Continue to hold diuretics.   - Continue Coreg 25 mg bid.  2.  AKI on CKD stage 3: Suspect due to over-diuresis.  Creatinie baseline 1.6-1.8  -Difficult to gauge volume status due to body habitus and baseline dyspnea likely in part due to obesity and deconditioning.  Creatinine trending up 2.1>2.3 > 2.6>2.8 - Continue to hold diuretics.  3. Atrial flutter: s/p ablation, in NSR today.  4. Atrial fibrillation: Patient had afib noted as well as flutter. He is in NSR today.  - Continue Xarelto 15 mg daily.  5. Normal pressure hydrocephalus: Has VP shunt.   6. OSA: Continue to use CPAP.   Continue to hold diuretics. Home once creatinine starts to come back down.   Length of Stay: [employer], NP  01/30/2020, 7:44 AM  Advanced Heart Failure Team Pager [phone] (M-F; 7a - 4p)  Please contact [facility] for night-coverage after hours (4p -7a ) and weekends on [email or web address]  Patient seen with NP, agree with the above note.   Creatinine up again at 2.8, off Entresto and diuretic on hold.   He feels like his breathing is still better than prior to admission.  No JVD but has 1+ ankle edema.   He will stay off Entresto with EF back to normal.  He will need  to start back on torsemide eventually, would tentatively plan to start torsemide 40 mg bid on Wednesday (mildly lower dose) with close followup as outpatient.   Loralie Champagne 01/30/2020 11:20 AM

## 2020-01-30 NOTE — Care Management Important Message (Signed)
Important Message  Patient Details  Name: Justin Hill MRN: [id number] Date of Birth: [birth year]   Medicare Important Message Given:  Yes     Shelda Altes 01/30/2020, 10:51 AM

## 2020-01-30 NOTE — Progress Notes (Signed)
PROGRESS NOTE    Justin Hill  XFG:[id number] DOB: 23-Nov-1941 DOA: 01/24/2020 PCP: Serita Grammes, MD     Brief Narrative:  Justin Hill is a 79 y.o.WM PMHx chronic systolic CHF last EF measured was 55 to 60% on May 2020 (resolved),s/p ablation of atrial flutter Hx of A. fib on Xarelto, pacer, DM type II controlled with complication,  normal pressure hydrocephalus s/p VP shunt, CKD stage IIIb ( baseline Cr 1.8 ), OSA on CPAP was referred from the primary care office after patient has become increasingly short of breath.  Patient states over the last 1 week patient has become increasingly short of breath denies any chest pain productive cough fever chills.  Denies any nausea vomiting or diarrhea.  ED Course: In the ER chest x-ray does not show anything acute on exam patient has bilateral lower extremity edema elevated JVD BNP was 35.1 high-sensitivity troponin was 9.  CBC shows anemia and thrombocytopenia which is chronic.  Creatinine is increased to 2.1 with baseline around 1.8-1.9.  Patient was given Lasix 60 mg IV and admitted for acute CHF.  Covid test was negative.   Subjective: 3/8 A/O x4, sitting in chair comfortably.   Assessment & Plan:   Principal Problem:   Acute CHF (congestive heart failure) (HCC) Active Problems:   Hypertension   CKD (chronic kidney disease) stage 3, GFR 30-59 ml/min   Essential hypertension   CAD- mild, non obstructive CAD 2008   Atrial flutter (HCC)   Acute systolic CHF (congestive heart failure) (HCC)   OSA on CPAP   Diabetes mellitus type 2, controlled, with complications (HCC)  Acute on Chronic diastolic CHF/prior Nonischemic Cardiomyopathy (patient Base weight 113.3 kg) -Resolved on May 2020 echocardiogram EF 55 to 60% -Acute CHF?  Echocardiogram; not consistent with acute exacerbation see results below -Strict in and out -7.1 L -Daily weight Filed Weights   01/28/20 0554 01/29/20 0608 01/30/20 0516  Weight: 120.7 kg 120.4 kg  121.5 kg  -3/5 cardiology restarted Torsemide 60 mg qAm/  40mg  qPm; will need to watch renal function closely -Entresto discontinued by cardiology -Coreg 25 mg BID -Patient's base weight of 113.3 kg most likely old base weight given patient's confusion do not believe patient has been weighing himself daily as he states and he has truly gained significant weight. -3/6 discussed case with Dr. Pierre Bali cardiology heart failure team, and from their standpoint patient can be discharged. -3/7 continued worsening renal function (hold) Torsemide 60 mg qAm/  40mg  qPm; bolus 0.45% saline 618ml  Hx LBBB/cardiomyopathy -S/p Medtronic CRT-D device placement  Paroxysmal atrial fibrillation -Continue Xarelto -See CHF -Currently NSR  HTN -See CHF  Acute on CKD stage IIIb (baseline Cr 1.85) Recent Labs  Lab 01/26/20 0236 01/27/20 0325 01/28/20 0523 01/29/20 0336 01/30/20 0411  CREATININE 2.70* 2.16* 2.33* 2.57* 2.83*  -See CHF -3/8 patient's creatinine continued to climb even though diuretic on hold.  And patient received bolus yesterday.  Will aggressively hydrate overnight if creatinine does not stabilize consult nephrology -3/8 0.45% saline 111ml/hr  DM type II controlled with complication -9/67 hemoglobin A1c= 6.4 -NovoLog 70/30 125 units breakfast -NovoLog 70/30 15 units supper -3/7 discontinue super sensitive SSI  Obese (BMI 37.55 kg/m) -Continue diabetic diet  OSA/OHS -CPAP per respiratory settings.  Patient to wear qhs or when napping during the day. -3/4 ABG; pH 7.4, PCO2 50.4, PO2 65.7.  Does not account for patient's increasing confusion.   Acute metabolic encephalopathy -Most likely multifactorial, acute on chronic  systolic CHF, OSA/OHS,. -Appears to be clearing, patient much more alert and interactive.  Normal pressure hydrocephalus -S/p VP shunt placement  Chronic anemia (baseline HgB 11.4) Recent Labs  Lab 01/26/20 0806 01/27/20 0325 01/28/20 0523  01/29/20 0336 01/30/20 0411  HGB 11.9*  12.2* 11.5* 10.8* 11.2* 10.8*  -Anemia panel pending  Thrombocytopenia -Results for JOACHIM, CARTON" (MRN [id number]) as of 01/27/2020 14:20  Ref. Range 01/24/2020 14:14 01/25/2020 05:21 01/26/2020 06:32 01/27/2020 03:25  Platelets Latest Ref Range: 150 - 400 K/uL 148 (L) PLATELET CLUMPS NOTED ON SMEAR, UNABLE TO ESTIMATE 145 (L) 147 (L)  -No signs of overt bleeding. -Stable  Goals of care -3/6 PT/OT consult; PT recommends SNF will place consult for LCSW to begin looking for SNF -3/7 obtain repeat Covid per SNF request.  Patient could go as early as 3/8 if Covid test returns and patient's Cr trending down again.    DVT prophylaxis: Xarelto Code Status: Full Family Communication: 3/4 attempted to call Sherren Mocha (son) received no answer at phone number given to RN.  Will attempt to call in a.m. Disposition Plan: Awaiting [facility]   Consultants:  Cardiology   Procedures/Significant Events:  3/4 Echocardiogram; LVEF; 55%.  -No regional wall  motion abnormalities.  -Left  ventricular diastolic parameters are  indeterminate.  3/4 RIGHT heart cath;Mildly elevated PCWP, normal RA pressure.  2. Mild pulmonary venous hypertension.  3. Normal cardiac output.    I have personally reviewed and interpreted all radiology studies and my findings are as above.  VENTILATOR SETTINGS:    Cultures 3/2 SARS coronavirus 2 negative 3/7 SARS coronavirus 2 pending    Antimicrobials:    Devices    LINES / TUBES:      Continuous Infusions:    Objective: Vitals:   01/30/20 0516 01/30/20 0819 01/30/20 0852 01/30/20 1136  BP: (!) 109/43 98/66  106/88  Pulse: 84 96  79  Resp: 18   11  Temp: 97.8 F (36.6 C)   (!) 97.3 F (36.3 C)  TempSrc:    Oral  SpO2: (!) 89% 96% 96% 95%  Weight: 121.5 kg     Height:        Intake/Output Summary (Last 24 hours) at 01/30/2020 1319 Last data filed at 01/30/2020 1256 Gross per 24 hour  Intake 1272 ml   Output 1075 ml  Net 197 ml   Filed Weights   01/28/20 0554 01/29/20 0608 01/30/20 0516  Weight: 120.7 kg 120.4 kg 121.5 kg    Physical Exam:  General: A/O x4, no acute respiratory distress Eyes: negative scleral hemorrhage, negative anisocoria, negative icterus ENT: Negative Runny nose, negative gingival bleeding, Neck:  Negative scars, masses, torticollis, lymphadenopathy, JVD Lungs: Clear to auscultation bilaterally without wheezes or crackles Cardiovascular: OBESE regular rate and rhythm without murmur gallop or rub normal S1 and S2 Abdomen: negative abdominal pain, nondistended, positive soft, bowel sounds, no rebound, no ascites, no appreciable mass Extremities: No significant cyanosis, clubbing, or edema bilateral lower extremities Skin: Negative rashes, lesions, ulcers Psychiatric:  Negative depression, negative anxiety, negative fatigue, negative mania  Central nervous system:  Cranial nerves II through XII intact, tongue/uvula midline, all extremities muscle strength 5/5, sensation intact throughout, negative dysarthria, negative expressive aphasia, negative receptive aphasia.  .     Data Reviewed: Care during the described time interval was provided by me .  I have reviewed this patient's available data, including medical history, events of note, physical examination, and all test results as part of my evaluation.  CBC: Recent Labs  Lab 01/24/20 1414 01/24/20 1414 01/25/20 0521 01/25/20 0521 01/26/20 3790 01/26/20 0806 01/27/20 0325 01/28/20 0523 01/29/20 0336 01/30/20 0411  WBC 5.7   < > 7.8  --   --   --  6.8 6.1 7.6 7.0  NEUTROABS 3.8  --  5.6  --   --   --   --   --   --   --   HGB 11.4*   < > 11.8*   < >  --  11.9*  12.2* 11.5* 10.8* 11.2* 10.8*  HCT 38.3*   < > 39.0   < >  --  35.0*  36.0* 39.1 36.2* 37.0* 35.5*  MCV 85.7   < > 83.9  --   --   --  84.4 83.8 83.5 82.8  PLT 148*   < > PLATELET CLUMPS NOTED ON SMEAR, UNABLE TO ESTIMATE   < > 145*  --   147* 134* 144* 136*   < > = values in this interval not displayed.   Basic Metabolic Panel: Recent Labs  Lab 01/26/20 0236 01/26/20 0236 01/26/20 2409 01/27/20 0325 01/28/20 0523 01/29/20 0336 01/30/20 0411  NA 146*   < > 143  143 142 144 145 141  K 3.8   < > 3.7  3.7 4.3 3.7 3.6 3.7  CL 99  --   --  101 101 100 98  CO2 34*  --   --  32 31 32 32  GLUCOSE 100*  --   --  138* 147* 112* 187*  BUN 22  --   --  26* 27* 32* 36*  CREATININE 2.70*  --   --  2.16* 2.33* 2.57* 2.83*  CALCIUM 9.5  --   --  9.6 9.2 9.0 9.1  MG 2.4  --   --  2.3 2.3 2.1 2.0   < > = values in this interval not displayed.   GFR: Estimated Creatinine Clearance: 29.5 mL/min (A) (by C-G formula based on SCr of 2.83 mg/dL (H)). Liver Function Tests: Recent Labs  Lab 01/25/20 0521  AST 19  ALT 15  ALKPHOS 36*  BILITOT 1.0  PROT 6.1*  ALBUMIN 3.3*   No results for input(s): LIPASE, AMYLASE in the last 168 hours. Recent Labs  Lab 01/28/20 0818  AMMONIA 25   Coagulation Profile: No results for input(s): INR, PROTIME in the last 168 hours. Cardiac Enzymes: No results for input(s): CKTOTAL, CKMB, CKMBINDEX, TROPONINI in the last 168 hours. BNP (last 3 results) No results for input(s): PROBNP in the last 8760 hours. HbA1C: No results for input(s): HGBA1C in the last 72 hours. CBG: Recent Labs  Lab 01/29/20 2022 01/30/20 0006 01/30/20 0452 01/30/20 0726 01/30/20 1104  GLUCAP 207* 169* 207* 171* 153*   Lipid Profile: No results for input(s): CHOL, HDL, LDLCALC, TRIG, CHOLHDL, LDLDIRECT in the last 72 hours. Thyroid Function Tests: No results for input(s): TSH, T4TOTAL, FREET4, T3FREE, THYROIDAB in the last 72 hours. Anemia Panel: No results for input(s): VITAMINB12, FOLATE, FERRITIN, TIBC, IRON, RETICCTPCT in the last 72 hours. Sepsis Labs: No results for input(s): PROCALCITON, LATICACIDVEN in the last 168 hours.  Recent Results (from the past 240 hour(s))  SARS CORONAVIRUS 2 (TAT 6-24  HRS) Nasopharyngeal Nasopharyngeal Swab     Status: None   Collection Time: 01/24/20  1:17 PM   Specimen: Nasopharyngeal Swab  Result Value Ref Range Status   SARS Coronavirus 2 NEGATIVE NEGATIVE Final    Comment: (NOTE) SARS-CoV-2  target nucleic acids are NOT DETECTED. The SARS-CoV-2 RNA is generally detectable in upper and lower respiratory specimens during the acute phase of infection. Negative results do not preclude SARS-CoV-2 infection, do not rule out co-infections with other pathogens, and should not be used as the sole basis for treatment or other patient management decisions. Negative results must be combined with clinical observations, patient history, and epidemiological information. The expected result is Negative. Fact Sheet for Patients: SugarRoll.be Fact Sheet for Healthcare Providers: https://www.Ammiel Guiney-mathews.com/ This test is not yet approved or cleared by the [place] FDA and  has been authorized for detection and/or diagnosis of SARS-CoV-2 by FDA under an Emergency Use Authorization (EUA). This EUA will remain  in effect (meaning this test can be used) for the duration of the COVID-19 declaration under Section 56 4(b)(1) of the Act, 21 U.S.C. section 360bbb-3(b)(1), unless the authorization is terminated or revoked sooner. Performed at [facility], [place] [address] [place], [place] [address]   SARS CORONAVIRUS 2 (TAT 6-24 HRS) Nasopharyngeal Nasopharyngeal Swab     Status: None   Collection Time: 01/29/20  2:21 PM   Specimen: Nasopharyngeal Swab  Result Value Ref Range Status   SARS Coronavirus 2 NEGATIVE NEGATIVE Final    Comment: (NOTE) SARS-CoV-2 target nucleic acids are NOT DETECTED. The SARS-CoV-2 RNA is generally detectable in upper and lower respiratory specimens during the acute phase of infection. Negative results do not preclude SARS-CoV-2 infection, do not rule out co-infections with other  pathogens, and should not be used as the sole basis for treatment or other patient management decisions. Negative results must be combined with clinical observations, patient history, and epidemiological information. The expected result is Negative. Fact Sheet for Patients: SugarRoll.be Fact Sheet for Healthcare Providers: https://www.Shakeitha Umbaugh-mathews.com/ This test is not yet approved or cleared by the [place] FDA and  has been authorized for detection and/or diagnosis of SARS-CoV-2 by FDA under an Emergency Use Authorization (EUA). This EUA will remain  in effect (meaning this test can be used) for the duration of the COVID-19 declaration under Section 56 4(b)(1) of the Act, 21 U.S.C. section 360bbb-3(b)(1), unless the authorization is terminated or revoked sooner. Performed at [facility], [place] [address] [place], [place] [address]          Radiology Studies: No results found.      Scheduled Meds: . vitamin C  1,000 mg Oral BID  . carvedilol  25 mg Oral BID WC  . cholecalciferol  1,000 Units Oral Daily  . fenofibrate  54 mg Oral Daily  . fluticasone furoate-vilanterol  1 puff Inhalation Daily  . insulin aspart protamine- aspart  125 Units Subcutaneous Q breakfast  . insulin aspart protamine- aspart  15 Units Subcutaneous Q supper  . loratadine  10 mg Oral BID  . multivitamin with minerals  1 tablet Oral Daily  . pantoprazole  40 mg Oral Daily  . Rivaroxaban  15 mg Oral Q supper  . simvastatin  40 mg Oral QHS  . sodium chloride flush  3 mL Intravenous Q12H  . tamsulosin  0.4 mg Oral Daily  . umeclidinium bromide  1 puff Inhalation Daily   Continuous Infusions:    LOS: 6 days    Time spent:40 min    Hagen Bohorquez, Geraldo Docker, MD [facility] Pager [phone]  If 7PM-7AM, please contact night-coverage [email or web address] Password [email or web address] 01/30/2020, 1:19 PM

## 2020-01-31 ENCOUNTER — Encounter (HOSPITAL_COMMUNITY): Payer: Medicare Other | Admitting: Cardiology

## 2020-01-31 LAB — GLUCOSE, CAPILLARY
Glucose-Capillary: 124 mg/dL — ABNORMAL HIGH (ref 70–99)
Glucose-Capillary: 160 mg/dL — ABNORMAL HIGH (ref 70–99)
Glucose-Capillary: 119 mg/dL — ABNORMAL HIGH (ref 70–99)
Glucose-Capillary: 163 mg/dL — ABNORMAL HIGH (ref 70–99)
Glucose-Capillary: 185 mg/dL — ABNORMAL HIGH (ref 70–99)

## 2020-01-31 LAB — COMPREHENSIVE METABOLIC PANEL
ALT: 16 U/L (ref 0–44)
AST: 17 U/L (ref 15–41)
Albumin: 3.3 g/dL — ABNORMAL LOW (ref 3.5–5.0)
Alkaline Phosphatase: 35 U/L — ABNORMAL LOW (ref 38–126)
Anion gap: 14 (ref 5–15)
BUN: 32 mg/dL — ABNORMAL HIGH (ref 8–23)
CO2: 29 mmol/L (ref 22–32)
Calcium: 9.1 mg/dL (ref 8.9–10.3)
Chloride: 97 mmol/L — ABNORMAL LOW (ref 98–111)
Creatinine, Ser: 2.26 mg/dL — ABNORMAL HIGH (ref 0.61–1.24)
GFR calc Af Amer: 31 mL/min — ABNORMAL LOW (ref >60)
GFR calc non Af Amer: 27 mL/min — ABNORMAL LOW (ref >60)
Glucose, Bld: 153 mg/dL — ABNORMAL HIGH (ref 70–99)
Potassium: 3.6 mmol/L (ref 3.5–5.1)
Sodium: 140 mmol/L (ref 135–145)
Total Bilirubin: 0.8 mg/dL (ref 0.3–1.2)
Total Protein: 5.7 g/dL — ABNORMAL LOW (ref 6.5–8.1)

## 2020-01-31 LAB — MAGNESIUM: Magnesium: 2.1 mg/dL (ref 1.7–2.4)

## 2020-01-31 LAB — CBC
HCT: 33.9 % — ABNORMAL LOW (ref 39.0–52.0)
Hemoglobin: 10.5 g/dL — ABNORMAL LOW (ref 13.0–17.0)
MCH: 25.5 pg — ABNORMAL LOW (ref 26.0–34.0)
MCHC: 31 g/dL (ref 30.0–36.0)
MCV: 82.5 fL (ref 80.0–100.0)
Platelets: 137 10*3/uL — ABNORMAL LOW (ref 150–400)
RBC: 4.11 MIL/uL — ABNORMAL LOW (ref 4.22–5.81)
RDW: 21.8 % — ABNORMAL HIGH (ref 11.5–15.5)
WBC: 7.6 10*3/uL (ref 4.0–10.5)
nRBC: 0 % (ref 0.0–0.2)

## 2020-01-31 LAB — PHOSPHORUS: Phosphorus: 3.9 mg/dL (ref 2.5–4.6)

## 2020-01-31 MED ORDER — HALOPERIDOL LACTATE 5 MG/ML IJ SOLN
2.0000 mg | Freq: Once | INTRAMUSCULAR | Status: AC
  Administered 2020-01-31: 2 mg via INTRAVENOUS
  Filled 2020-01-31: qty 1

## 2020-01-31 NOTE — Progress Notes (Signed)
Physical Therapy Treatment Patient Details Name: Justin Hill MRN: [id number] DOB: [birth year] Today's Date: 01/31/2020    History of Present Illness Pt is a 79 y/o male admitted secondary to increased SOB. Thought to be secondary to CHF exacerbation. Pt is also s/p R heart cath. PMH includes CKD, HTN, CAD, CHF, OSA on CPAP, and DM.     PT Comments    Patient is making progress toward PT goals and tolerated ambulation on RA with SpO2 92%. Pt presents with cognitive deficits and gait deviations increasing risk for fall. Continue to recommend SNF at d/c unless 24 hour supervision/assist available at home.    Follow Up Recommendations  SNF;Supervision/Assistance - 24 hour     Equipment Recommendations  None recommended by PT    Recommendations for Other Services       Precautions / Restrictions Precautions Precautions: Fall Restrictions Weight Bearing Restrictions: No    Mobility  Bed Mobility               General bed mobility comments: pt OOB in chair upon arrival   Transfers Overall transfer level: Needs assistance Equipment used: None Transfers: Sit to/from Stand Sit to Stand: Supervision            Ambulation/Gait Ambulation/Gait assistance: Supervision Gait Distance (Feet): 300 Feet Assistive device: None Gait Pattern/deviations: Step-through pattern;Decreased stride length;Wide base of support;Trunk flexed Gait velocity: Decreased   General Gait Details: supervision for safety (did not give challenges) as pt with gait instability and flexed posture; SpO2 92% on RA with mobility   Stairs             Wheelchair Mobility    Modified Rankin (Stroke Patients Only)       Balance Overall balance assessment: Needs assistance Sitting-balance support: No upper extremity supported;Feet supported Sitting balance-Leahy Scale: Good     Standing balance support: During functional activity;No upper extremity supported Standing balance-Leahy  Scale: Fair                              Cognition Arousal/Alertness: Awake/alert Behavior During Therapy: Flat affect Overall Cognitive Status: Impaired/Different from baseline Area of Impairment: Memory;Problem solving;Awareness;Safety/judgement                     Memory: Decreased short-term memory;Decreased recall of precautions   Safety/Judgement: Decreased awareness of safety;Decreased awareness of deficits Awareness: Emergent Problem Solving: Slow processing General Comments: pt unable to complete wayfinding task in hallway      Exercises      General Comments        Pertinent Vitals/Pain Pain Assessment: No/denies pain    Home Living                      Prior Function            PT Goals (current goals can now be found in the care plan section) Acute Rehab PT Goals Patient Stated Goal: to go home Progress towards PT goals: Progressing toward goals    Frequency    Min 2X/week      PT Plan      Co-evaluation              AM-PAC PT "6 Clicks" Mobility   Outcome Measure  Help needed turning from your back to your side while in a flat bed without using bedrails?: None Help needed moving from lying on your back to  sitting on the side of a flat bed without using bedrails?: None Help needed moving to and from a bed to a chair (including a wheelchair)?: None Help needed standing up from a chair using your arms (e.g., wheelchair or bedside chair)?: None Help needed to walk in hospital room?: A Little Help needed climbing 3-5 steps with a railing? : A Little 6 Click Score: 22    End of Session   Activity Tolerance: Patient tolerated treatment well Patient left: with call bell/phone within reach;in chair Nurse Communication: Mobility status PT Visit Diagnosis: Unsteadiness on feet (R26.81);Muscle weakness (generalized) (M62.81)     Time: 3845-3646 PT Time Calculation (min) (ACUTE ONLY): 11 min  Charges:  $Gait  Training: 8-22 mins                     Earney Navy, PTA Acute Rehabilitation Services Pager: [phone] Office: [phone]     Darliss Cheney 01/31/2020, 4:49 PM

## 2020-01-31 NOTE — Progress Notes (Addendum)
Patient returned to unit, current vitals are as follows:   01/31/20 1345  Vitals  Temp 98.5 F (36.9 C)  Temp Source Oral  BP 115/79  BP Location Left Arm  BP Method Automatic  Patient Position (if appropriate) Sitting  Pulse Rate 83  Pulse Rate Source Dinamap  Resp 17  Level of Consciousness  Level of Consciousness Alert  Oxygen Therapy  SpO2 94 %  O2 Device Room Air  MEWS Score  MEWS Temp 0  MEWS Systolic 0  MEWS Pulse 0  MEWS RR 0  MEWS LOC 0  MEWS Score 0  MEWS Score Color Green   Patient currently sitting in chair, eating lunch tray. Denies any pain as well as physical or emotional needs. Patient's son currently at bedside and states he will return to patient's room after making some phone calls. CBG is 119 upon returning.

## 2020-01-31 NOTE — TOC Progression Note (Signed)
Transition of Care [facility]) - Progression Note    Patient Details  Name: Justin Hill MRN: [id number] Date of Birth: 1941-04-12  Transition of Care [facility]) CM/SW [place], [place] Phone Number: 01/31/2020, 11:49 AM  Clinical Narrative:     CSW spoke with [facility] they report they are waiving another covid test needing to be done if patient will discharge tomorrow, as they are aware his wife is actively dying on 6N.   Plan for patient to be with his spouse during this time and discharge tomorrow to [facility].  Expected Discharge Plan: [place] Barriers to Discharge: Continued Medical Work up  Expected Discharge Plan and Services Expected Discharge Plan: [place] Choice: [place] arrangements for the past 2 months: Single Family Home                                       Social Determinants of Health (SDOH) Interventions    Readmission Risk Interventions No flowsheet data found.

## 2020-01-31 NOTE — Progress Notes (Addendum)
Patient ID: Justin Hill, male   DOB: [birth year], 79 y.o.   MRN: [id number]     Advanced Heart Failure Rounding Note  PCP-Cardiologist: Loralie Champagne, MD   Subjective:   Discharge held due to elevated creatinine.   He has been receiving IV fluids > 12 hours. .   Creatinine trending up 2.3>2.5>2.8> pending.   Denies SOB. Denies dizziness.    Objective:   Weight Range: 123.2 kg Body mass index is 37.89 kg/m.   Vital Signs:   Temp:  [97.3 F (36.3 C)-99.1 F (37.3 C)] 99.1 F (37.3 C) (03/08 1949) Pulse Rate:  [79-96] 79 (03/09 0810) Resp:  [11-16] 16 (03/09 0810) BP: (98-131)/(61-88) 122/74 (03/09 0810) SpO2:  [93 %-96 %] 93 % (03/09 0810) Weight:  [123.2 kg] 123.2 kg (03/09 0058) Last BM Date: 01/29/20  Weight change: Filed Weights   01/29/20 0608 01/30/20 0516 01/31/20 0058  Weight: 120.4 kg 121.5 kg 123.2 kg    Intake/Output:   Intake/Output Summary (Last 24 hours) at 01/31/2020 0817 Last data filed at 01/31/2020 0744 Gross per 24 hour  Intake 2375.97 ml  Output 1200 ml  Net 1175.97 ml      Physical Exam   General:  Sitting on the side of the bed. No resp difficulty HEENT: normal Neck: supple. no JVD. Carotids 2+ bilat; no bruits. No lymphadenopathy or thryomegaly appreciated. Cor: PMI nondisplaced. Regular rate & rhythm. No rubs, gallops or murmurs. Lungs: clear Abdomen: soft, nontender, nondistended. No hepatosplenomegaly. No bruits or masses. Good bowel sounds. Extremities: no cyanosis, clubbing, rash, edema Neuro: alert & orientedx3, cranial nerves grossly intact. moves all 4 extremities w/o difficulty. Affect pleasant   Telemetry  NSR BIV pacing 80s   Labs    CBC Recent Labs    01/30/20 0411 01/31/20 0354  WBC 7.0 7.6  HGB 10.8* 10.5*  HCT 35.5* 33.9*  MCV 82.8 82.5  PLT 136* 967*   Basic Metabolic Panel Recent Labs    01/29/20 0336 01/30/20 0411  NA 145 141  K 3.6 3.7  CL 100 98  CO2 32 32  GLUCOSE 112* 187*  BUN 32* 36*    CREATININE 2.57* 2.83*  CALCIUM 9.0 9.1  MG 2.1 2.0   Liver Function Tests No results for input(s): AST, ALT, ALKPHOS, BILITOT, PROT, ALBUMIN in the last 72 hours. No results for input(s): LIPASE, AMYLASE in the last 72 hours. Cardiac Enzymes No results for input(s): CKTOTAL, CKMB, CKMBINDEX, TROPONINI in the last 72 hours.  BNP: BNP (last 3 results) Recent Labs    01/24/20 1414  BNP 35.1    ProBNP (last 3 results) No results for input(s): PROBNP in the last 8760 hours.   D-Dimer No results for input(s): DDIMER in the last 72 hours. Hemoglobin A1C No results for input(s): HGBA1C in the last 72 hours. Fasting Lipid Panel No results for input(s): CHOL, HDL, LDLCALC, TRIG, CHOLHDL, LDLDIRECT in the last 72 hours. Thyroid Function Tests No results for input(s): TSH, T4TOTAL, T3FREE, THYROIDAB in the last 72 hours.  Invalid input(s): FREET3  Other results:   Imaging    No results found.   Medications:     Scheduled Medications: . vitamin C  1,000 mg Oral BID  . carvedilol  25 mg Oral BID WC  . cholecalciferol  1,000 Units Oral Daily  . fenofibrate  54 mg Oral Daily  . fluticasone furoate-vilanterol  1 puff Inhalation Daily  . insulin aspart protamine- aspart  125 Units Subcutaneous Q breakfast  .  insulin aspart protamine- aspart  15 Units Subcutaneous Q supper  . loratadine  10 mg Oral BID  . multivitamin with minerals  1 tablet Oral Daily  . pantoprazole  40 mg Oral Daily  . Rivaroxaban  15 mg Oral Q supper  . simvastatin  40 mg Oral QHS  . sodium chloride flush  3 mL Intravenous Q12H  . tamsulosin  0.4 mg Oral Daily  . umeclidinium bromide  1 puff Inhalation Daily    Infusions: . sodium chloride 100 mL/hr at 01/31/20 0239    PRN Medications: acetaminophen **OR** acetaminophen, diphenhydrAMINE, ondansetron **OR** ondansetron (ZOFRAN) IV  Assessment/Plan   1. Acute on chronic diastolic CHF: Prior nonischemic cardiomyopathy.  Low EF x years. Echo  6/16 showed EF 25-30% with septal-lateral dyssynchrony.  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  Interestingly, it also appears that he has a long-standing LBBB.  A chronic LBBB itself can potentially cause a cardiomyopathy and may be the source of his low EF.   Coronary angiography in 6/17 with nonobstructive disease. Now s/p Medtronic CRT-D device placement.  Echo in 5/20 showed EF normalized, 55-60%.  Echo this admission showed EF still 55%.   He was admitted this time for persistent dyspnea despite increasing outpatient diuretics.  He got IV Lasix here,  weight down but developed AKI on CKD with creatinine up to 2.7.  [place] 01/26/20 showed mildly elevated PCWP and normal RA pressure. - BMET pending. If creatinine trending down would start torsemide 40 mg tiwce a day tomorrow.  - Diuretics/entresto stopped with AKI. Stay off entresto.    - Continue Coreg 25 mg bid.  2.  AKI on CKD stage 3: Suspect due to over-diuresis.  Creatinie baseline 1.6-1.8  -Difficult to gauge volume status due to body habitus and baseline dyspnea likely in part due to obesity and deconditioning.  Creatinine trending up 2.1>2.3 > 2.6>2.8> pending.  - Continue to hold diuretics.  3. Atrial flutter: s/p ablation, in NSR today.  4. Atrial fibrillation: Patient had afib noted as well as flutter. He is in NSR today.  - Continue Xarelto 15 mg daily.  5. Normal pressure hydrocephalus: Has VP shunt.   6. OSA: Continue to use CPAP.   BMET pending. Home if creatinine down. Stop IV Fluids. Start torsemide 40 mg twice a day on Wednesday if renal funciton is better.   Length of Stay: [place], NP  01/31/2020, 8:17 AM  Advanced Heart Failure Team Pager [phone] (M-F; 7a - 4p)  Please contact [facility] for night-coverage after hours (4p -7a ) and weekends on [email or web address]  Agree with the above note.    BMET still pending.    Would stop IV fluid.  No more  Entresto.  If creatinine trending down today, would send home and start on lower dose torsemide 40 mg po bid tomorrow (Wednesday).  Will need CHF clinic followup.   Loralie Champagne 01/31/2020

## 2020-01-31 NOTE — Progress Notes (Signed)
Attempts to reorient patient remain unsuccessful. Patient continues to remove IV tubing and continues to wander the halls without a mask. PRN benadryl provided to aide sleep. Ineffective.

## 2020-01-31 NOTE — Progress Notes (Signed)
Patient off of floor at approximately 1000 to visit with spouse on [place]. Currently still off of unit.

## 2020-01-31 NOTE — Progress Notes (Signed)
PROGRESS NOTE    Justin Hill  HMC:[id number] DOB: [birth year] DOA: 01/24/2020 PCP: Serita Grammes, MD     Brief Narrative:  Justin Hill is a 79 y.o.WM PMHx chronic systolic CHF last EF measured was 55 to 60% on May 2020 (resolved),s/p ablation of atrial flutter Hx of A. fib on Xarelto, pacer, DM type II controlled with complication,  normal pressure hydrocephalus s/p VP shunt, CKD stage IIIb ( baseline Cr 1.8 ), OSA on CPAP was referred from the primary care office after patient has become increasingly short of breath.  Patient states over the last 1 week patient has become increasingly short of breath denies any chest pain productive cough fever chills.  Denies any nausea vomiting or diarrhea.  ED Course: In the ER chest x-ray does not show anything acute on exam patient has bilateral lower extremity edema elevated JVD BNP was 35.1 high-sensitivity troponin was 9.  CBC shows anemia and thrombocytopenia which is chronic.  Creatinine is increased to 2.1 with baseline around 1.8-1.9.  Patient was given Lasix 60 mg IV and admitted for acute CHF.  Covid test was negative.   Subjective: 3/9 A/O x4, negative S OB, negative CP   Assessment & Plan:   Principal Problem:   Acute CHF (congestive heart failure) (HCC) Active Problems:   Hypertension   CKD (chronic kidney disease) stage 3, GFR 30-59 ml/min   Essential hypertension   CAD- mild, non obstructive CAD 2008   Atrial flutter (HCC)   Acute systolic CHF (congestive heart failure) (HCC)   OSA on CPAP   Diabetes mellitus type 2, controlled, with complications (HCC)   Acute on chronic diastolic CHF (congestive heart failure) (HCC)  Acute on Chronic diastolic CHF/prior Nonischemic Cardiomyopathy (patient Base weight 113.3 kg) -Resolved on May 2020 echocardiogram EF 55 to 60% -Acute CHF?  Echocardiogram; not consistent with acute exacerbation see results below -Strict in and out -6.5 -Daily weight Filed Weights   01/29/20  0608 01/30/20 0516 01/31/20 0058  Weight: 120.4 kg 121.5 kg 123.2 kg  -3/5 cardiology restarted Torsemide 60 mg qAm/  40mg  qPm; will need to watch renal function closely -Entresto discontinued by cardiology -Coreg 25 mg BID -Patient's base weight of 113.3 kg most likely old base weight given patient's confusion do not believe patient has been weighing himself daily as he states and he has truly gained significant weight. -3/6 discussed case with Dr. Pierre Bali cardiology heart failure team, and from their standpoint patient can be discharged. -3/7 continued worsening renal function (hold) Torsemide 60 mg qAm/  40mg  qPm; bolus 0.45% saline 657ml  Hx LBBB/cardiomyopathy -S/p Medtronic CRT-D device placement  Paroxysmal atrial fibrillation -Continue Xarelto -See CHF -Currently NSR  HTN -See CHF  Acute on CKD stage IIIb (baseline Cr 1.85) Recent Labs  Lab 01/27/20 0325 01/28/20 0523 01/29/20 0336 01/30/20 0411 01/31/20 0758  CREATININE 2.16* 2.33* 2.57* 2.83* 2.26*  -See CHF -3/8 patient's creatinine continued to climb even though diuretic on hold.  And patient received bolus yesterday.  Will aggressively hydrate overnight if creatinine does not stabilize consult nephrology -3/8 0.45% saline 149ml/hr -3/9 patient's creatinine improving with hydration we will continue unchanged -3/9 if patient's creatinine continues to improve overnight could discharge patient in the A.m.  DM type II controlled with complication -6/62 hemoglobin A1c= 6.4 -NovoLog 70/30 125 units breakfast -NovoLog 70/30 15 units supper -3/7 discontinue super sensitive SSI  Obese (BMI 37.55 kg/m) -Continue diabetic diet  OSA/OHS -CPAP per respiratory settings.  Patient to  wear qhs or when napping during the day. -3/4 ABG; pH 7.4, PCO2 50.4, PO2 65.7.  Does not account for patient's increasing confusion.   Acute metabolic encephalopathy -Most likely multifactorial, acute on chronic systolic CHF,  OSA/OHS,. -Appears to be clearing, patient much more alert and interactive.  Normal pressure hydrocephalus -S/p VP shunt placement  Chronic Normocytic anemia (baseline HgB 11.4) Recent Labs  Lab 01/27/20 0325 01/28/20 0523 01/29/20 0336 01/30/20 0411 01/31/20 0354  HGB 11.5* 10.8* 11.2* 10.8* 10.5*  -Anemia panel most consistent with normocytic anemia  Thrombocytopenia -Results for CORDARO, MUKAI" (MRN [id number]) as of 01/27/2020 14:20  Ref. Range 01/24/2020 14:14 01/25/2020 05:21 01/26/2020 06:32 01/27/2020 03:25  Platelets Latest Ref Range: 150 - 400 K/uL 148 (L) PLATELET CLUMPS NOTED ON SMEAR, UNABLE TO ESTIMATE 145 (L) 147 (L)  -No signs of overt bleeding. -Stable  Goals of care -3/6 PT/OT consult; PT recommends SNF will place consult for LCSW to begin looking for SNF -3/9 obtain repeat Covid per SNF request.  Patient could go as early as 3/10 if Covid test returns and patient's Cr trending down again.    DVT prophylaxis: Xarelto Code Status: Full Family Communication: 3/4 attempted to call Sherren Mocha (son) received no answer at phone number given to RN.  Will attempt to call in a.m. Disposition Plan: Awaiting [facility]   Consultants:  Cardiology   Procedures/Significant Events:  3/4 Echocardiogram; LVEF; 55%.  -No regional wall  motion abnormalities.  -Left  ventricular diastolic parameters are  indeterminate.  3/4 RIGHT heart cath;Mildly elevated PCWP, normal RA pressure.  2. Mild pulmonary venous hypertension.  3. Normal cardiac output.    I have personally reviewed and interpreted all radiology studies and my findings are as above.  VENTILATOR SETTINGS:    Cultures 3/2 SARS coronavirus 2 negative 3/7 SARS coronavirus 2 pending    Antimicrobials:    Devices    LINES / TUBES:      Continuous Infusions: . sodium chloride 100 mL/hr at 01/31/20 0239     Objective: Vitals:   01/30/20 1136 01/30/20 1759 01/30/20 1949 01/31/20 0058  BP:  106/88 116/61 131/64   Pulse: 79 80 81   Resp: 11  14   Temp: (!) 97.3 F (36.3 C)  99.1 F (37.3 C)   TempSrc: Oral  Oral   SpO2: 95% 93% 95%   Weight:    123.2 kg  Height:        Intake/Output Summary (Last 24 hours) at 01/31/2020 0746 Last data filed at 01/31/2020 0744 Gross per 24 hour  Intake 2615.97 ml  Output 1400 ml  Net 1215.97 ml   Filed Weights   01/29/20 0608 01/30/20 0516 01/31/20 0058  Weight: 120.4 kg 121.5 kg 123.2 kg   Physical Exam:  General: A/O x4, no acute respiratory distress Eyes: negative scleral hemorrhage, negative anisocoria, negative icterus ENT: Negative Runny nose, negative gingival bleeding, Neck:  Negative scars, masses, torticollis, lymphadenopathy, JVD Lungs: Clear to auscultation bilaterally without wheezes or crackles Cardiovascular: OBESE regular rate and rhythm without murmur gallop or rub normal S1 and S2 Abdomen: negative abdominal pain, nondistended, positive soft, bowel sounds, no rebound, no ascites, no appreciable mass Extremities: No significant cyanosis, clubbing, or edema bilateral lower extremities Skin: Negative rashes, lesions, ulcers Psychiatric:  Negative depression, negative anxiety, negative fatigue, negative mania  Central nervous system:  Cranial nerves II through XII intact, tongue/uvula midline, all extremities muscle strength 5/5, sensation intact throughout, negative dysarthria, negative expressive aphasia, negative receptive aphasia.  Marland Kitchen  Data Reviewed: Care during the described time interval was provided by me .  I have reviewed this patient's available data, including medical history, events of note, physical examination, and all test results as part of my evaluation.  CBC: Recent Labs  Lab 01/24/20 1414 01/24/20 1414 01/25/20 0521 01/26/20 7793 01/27/20 0325 01/28/20 0523 01/29/20 0336 01/30/20 0411 01/31/20 0354  WBC 5.7   < > 7.8  --  6.8 6.1 7.6 7.0 7.6  NEUTROABS 3.8  --  5.6  --   --   --   --    --   --   HGB 11.4*   < > 11.8*   < > 11.5* 10.8* 11.2* 10.8* 10.5*  HCT 38.3*   < > 39.0   < > 39.1 36.2* 37.0* 35.5* 33.9*  MCV 85.7   < > 83.9  --  84.4 83.8 83.5 82.8 82.5  PLT 148*   < > PLATELET CLUMPS NOTED ON SMEAR, UNABLE TO ESTIMATE   < > 147* 134* 144* 136* 137*   < > = values in this interval not displayed.   Basic Metabolic Panel: Recent Labs  Lab 01/26/20 0236 01/26/20 0236 01/26/20 9030 01/27/20 0325 01/28/20 0523 01/29/20 0336 01/30/20 0411  NA 146*   < > 143  143 142 144 145 141  K 3.8   < > 3.7  3.7 4.3 3.7 3.6 3.7  CL 99  --   --  101 101 100 98  CO2 34*  --   --  32 31 32 32  GLUCOSE 100*  --   --  138* 147* 112* 187*  BUN 22  --   --  26* 27* 32* 36*  CREATININE 2.70*  --   --  2.16* 2.33* 2.57* 2.83*  CALCIUM 9.5  --   --  9.6 9.2 9.0 9.1  MG 2.4  --   --  2.3 2.3 2.1 2.0   < > = values in this interval not displayed.   GFR: Estimated Creatinine Clearance: 29.7 mL/min (A) (by C-G formula based on SCr of 2.83 mg/dL (H)). Liver Function Tests: Recent Labs  Lab 01/25/20 0521  AST 19  ALT 15  ALKPHOS 36*  BILITOT 1.0  PROT 6.1*  ALBUMIN 3.3*   No results for input(s): LIPASE, AMYLASE in the last 168 hours. Recent Labs  Lab 01/28/20 0818  AMMONIA 25   Coagulation Profile: No results for input(s): INR, PROTIME in the last 168 hours. Cardiac Enzymes: No results for input(s): CKTOTAL, CKMB, CKMBINDEX, TROPONINI in the last 168 hours. BNP (last 3 results) No results for input(s): PROBNP in the last 8760 hours. HbA1C: No results for input(s): HGBA1C in the last 72 hours. CBG: Recent Labs  Lab 01/30/20 1104 01/30/20 1636 01/30/20 1940 01/30/20 2331 01/31/20 0310  GLUCAP 153* 109* 141* 152* 124*   Lipid Profile: No results for input(s): CHOL, HDL, LDLCALC, TRIG, CHOLHDL, LDLDIRECT in the last 72 hours. Thyroid Function Tests: No results for input(s): TSH, T4TOTAL, FREET4, T3FREE, THYROIDAB in the last 72 hours. Anemia Panel: No results  for input(s): VITAMINB12, FOLATE, FERRITIN, TIBC, IRON, RETICCTPCT in the last 72 hours. Sepsis Labs: No results for input(s): PROCALCITON, LATICACIDVEN in the last 168 hours.  Recent Results (from the past 240 hour(s))  SARS CORONAVIRUS 2 (TAT 6-24 HRS) Nasopharyngeal Nasopharyngeal Swab     Status: None   Collection Time: 01/24/20  1:17 PM   Specimen: Nasopharyngeal Swab  Result Value Ref Range Status   SARS  Coronavirus 2 NEGATIVE NEGATIVE Final    Comment: (NOTE) SARS-CoV-2 target nucleic acids are NOT DETECTED. The SARS-CoV-2 RNA is generally detectable in upper and lower respiratory specimens during the acute phase of infection. Negative results do not preclude SARS-CoV-2 infection, do not rule out co-infections with other pathogens, and should not be used as the sole basis for treatment or other patient management decisions. Negative results must be combined with clinical observations, patient history, and epidemiological information. The expected result is Negative. Fact Sheet for Patients: SugarRoll.be Fact Sheet for Healthcare Providers: https://www.Smera Guyette-mathews.com/ This test is not yet approved or cleared by the [place] FDA and  has been authorized for detection and/or diagnosis of SARS-CoV-2 by FDA under an Emergency Use Authorization (EUA). This EUA will remain  in effect (meaning this test can be used) for the duration of the COVID-19 declaration under Section 56 4(b)(1) of the Act, 21 U.S.C. section 360bbb-3(b)(1), unless the authorization is terminated or revoked sooner. Performed at [facility], [place] [address], [place], [place] [address]   SARS CORONAVIRUS 2 (TAT 6-24 HRS) Nasopharyngeal Nasopharyngeal Swab     Status: None   Collection Time: 01/29/20  2:21 PM   Specimen: Nasopharyngeal Swab  Result Value Ref Range Status   SARS Coronavirus 2 NEGATIVE NEGATIVE Final    Comment: (NOTE) SARS-CoV-2 target  nucleic acids are NOT DETECTED. The SARS-CoV-2 RNA is generally detectable in upper and lower respiratory specimens during the acute phase of infection. Negative results do not preclude SARS-CoV-2 infection, do not rule out co-infections with other pathogens, and should not be used as the sole basis for treatment or other patient management decisions. Negative results must be combined with clinical observations, patient history, and epidemiological information. The expected result is Negative. Fact Sheet for Patients: SugarRoll.be Fact Sheet for Healthcare Providers: https://www.Kevion Fatheree-mathews.com/ This test is not yet approved or cleared by the [place] FDA and  has been authorized for detection and/or diagnosis of SARS-CoV-2 by FDA under an Emergency Use Authorization (EUA). This EUA will remain  in effect (meaning this test can be used) for the duration of the COVID-19 declaration under Section 56 4(b)(1) of the Act, 21 U.S.C. section 360bbb-3(b)(1), unless the authorization is terminated or revoked sooner. Performed at [facility], [place] [address], [place], [place] [address]          Radiology Studies: No results found.      Scheduled Meds: . vitamin C  1,000 mg Oral BID  . carvedilol  25 mg Oral BID WC  . cholecalciferol  1,000 Units Oral Daily  . fenofibrate  54 mg Oral Daily  . fluticasone furoate-vilanterol  1 puff Inhalation Daily  . insulin aspart protamine- aspart  125 Units Subcutaneous Q breakfast  . insulin aspart protamine- aspart  15 Units Subcutaneous Q supper  . loratadine  10 mg Oral BID  . multivitamin with minerals  1 tablet Oral Daily  . pantoprazole  40 mg Oral Daily  . Rivaroxaban  15 mg Oral Q supper  . simvastatin  40 mg Oral QHS  . sodium chloride flush  3 mL Intravenous Q12H  . tamsulosin  0.4 mg Oral Daily  . umeclidinium bromide  1 puff Inhalation Daily   Continuous Infusions: .  sodium chloride 100 mL/hr at 01/31/20 0239     LOS: 7 days    Time spent:40 min    Jourdin Gens, Geraldo Docker, MD [facility] Pager [phone]  If 7PM-7AM, please contact night-coverage [email or web address] Password  [email or web address] 01/31/2020, 7:46 AM

## 2020-02-01 DIAGNOSIS — I5033 Acute on chronic diastolic (congestive) heart failure: Secondary | ICD-10-CM | POA: Diagnosis not present

## 2020-02-01 DIAGNOSIS — D696 Thrombocytopenia, unspecified: Secondary | ICD-10-CM | POA: Diagnosis not present

## 2020-02-01 DIAGNOSIS — G9341 Metabolic encephalopathy: Secondary | ICD-10-CM | POA: Diagnosis not present

## 2020-02-01 DIAGNOSIS — I428 Other cardiomyopathies: Secondary | ICD-10-CM | POA: Diagnosis not present

## 2020-02-01 DIAGNOSIS — D500 Iron deficiency anemia secondary to blood loss (chronic): Secondary | ICD-10-CM | POA: Diagnosis not present

## 2020-02-01 DIAGNOSIS — Z950 Presence of cardiac pacemaker: Secondary | ICD-10-CM | POA: Diagnosis not present

## 2020-02-01 DIAGNOSIS — I480 Paroxysmal atrial fibrillation: Secondary | ICD-10-CM | POA: Diagnosis not present

## 2020-02-01 DIAGNOSIS — R262 Difficulty in walking, not elsewhere classified: Secondary | ICD-10-CM | POA: Diagnosis not present

## 2020-02-01 DIAGNOSIS — R5381 Other malaise: Secondary | ICD-10-CM | POA: Diagnosis not present

## 2020-02-01 DIAGNOSIS — G4733 Obstructive sleep apnea (adult) (pediatric): Secondary | ICD-10-CM | POA: Diagnosis not present

## 2020-02-01 DIAGNOSIS — M2550 Pain in unspecified joint: Secondary | ICD-10-CM | POA: Diagnosis not present

## 2020-02-01 DIAGNOSIS — I5032 Chronic diastolic (congestive) heart failure: Secondary | ICD-10-CM | POA: Diagnosis not present

## 2020-02-01 DIAGNOSIS — N183 Chronic kidney disease, stage 3 unspecified: Secondary | ICD-10-CM | POA: Diagnosis not present

## 2020-02-01 DIAGNOSIS — E669 Obesity, unspecified: Secondary | ICD-10-CM | POA: Diagnosis not present

## 2020-02-01 DIAGNOSIS — Z7401 Bed confinement status: Secondary | ICD-10-CM | POA: Diagnosis not present

## 2020-02-01 DIAGNOSIS — I4891 Unspecified atrial fibrillation: Secondary | ICD-10-CM | POA: Diagnosis not present

## 2020-02-01 DIAGNOSIS — I10 Essential (primary) hypertension: Secondary | ICD-10-CM | POA: Diagnosis not present

## 2020-02-01 DIAGNOSIS — Z8679 Personal history of other diseases of the circulatory system: Secondary | ICD-10-CM | POA: Diagnosis not present

## 2020-02-01 DIAGNOSIS — I2510 Atherosclerotic heart disease of native coronary artery without angina pectoris: Secondary | ICD-10-CM | POA: Diagnosis not present

## 2020-02-01 DIAGNOSIS — I429 Cardiomyopathy, unspecified: Secondary | ICD-10-CM | POA: Diagnosis not present

## 2020-02-01 DIAGNOSIS — I5031 Acute diastolic (congestive) heart failure: Secondary | ICD-10-CM

## 2020-02-01 DIAGNOSIS — I959 Hypotension, unspecified: Secondary | ICD-10-CM | POA: Diagnosis not present

## 2020-02-01 DIAGNOSIS — E118 Type 2 diabetes mellitus with unspecified complications: Secondary | ICD-10-CM | POA: Diagnosis not present

## 2020-02-01 DIAGNOSIS — I5020 Unspecified systolic (congestive) heart failure: Secondary | ICD-10-CM | POA: Diagnosis not present

## 2020-02-01 DIAGNOSIS — Z982 Presence of cerebrospinal fluid drainage device: Secondary | ICD-10-CM | POA: Diagnosis not present

## 2020-02-01 LAB — CBC WITH DIFFERENTIAL/PLATELET
Abs Immature Granulocytes: 0.04 10*3/uL (ref 0.00–0.07)
Basophils Absolute: 0 10*3/uL (ref 0.0–0.1)
Basophils Relative: 1 %
Eosinophils Absolute: 0.2 10*3/uL (ref 0.0–0.5)
Eosinophils Relative: 3 %
HCT: 34.9 % — ABNORMAL LOW (ref 39.0–52.0)
Hemoglobin: 10.7 g/dL — ABNORMAL LOW (ref 13.0–17.0)
Immature Granulocytes: 1 %
Lymphocytes Relative: 20 %
Lymphs Abs: 1.3 10*3/uL (ref 0.7–4.0)
MCH: 25.2 pg — ABNORMAL LOW (ref 26.0–34.0)
MCHC: 30.7 g/dL (ref 30.0–36.0)
MCV: 82.1 fL (ref 80.0–100.0)
Monocytes Absolute: 0.6 10*3/uL (ref 0.1–1.0)
Monocytes Relative: 9 %
Neutro Abs: 4.2 10*3/uL (ref 1.7–7.7)
Neutrophils Relative %: 66 %
Platelets: 116 10*3/uL — ABNORMAL LOW (ref 150–400)
RBC: 4.25 MIL/uL (ref 4.22–5.81)
RDW: 22.1 % — ABNORMAL HIGH (ref 11.5–15.5)
WBC: 6.3 10*3/uL (ref 4.0–10.5)
nRBC: 0 % (ref 0.0–0.2)

## 2020-02-01 LAB — GLUCOSE, CAPILLARY
Glucose-Capillary: 101 mg/dL — ABNORMAL HIGH (ref 70–99)
Glucose-Capillary: 127 mg/dL — ABNORMAL HIGH (ref 70–99)
Glucose-Capillary: 147 mg/dL — ABNORMAL HIGH (ref 70–99)
Glucose-Capillary: 95 mg/dL (ref 70–99)

## 2020-02-01 LAB — PHOSPHORUS: Phosphorus: 3.3 mg/dL (ref 2.5–4.6)

## 2020-02-01 LAB — BASIC METABOLIC PANEL
Anion gap: 9 (ref 5–15)
BUN: 29 mg/dL — ABNORMAL HIGH (ref 8–23)
CO2: 30 mmol/L (ref 22–32)
Calcium: 9.4 mg/dL (ref 8.9–10.3)
Chloride: 101 mmol/L (ref 98–111)
Creatinine, Ser: 2.06 mg/dL — ABNORMAL HIGH (ref 0.61–1.24)
GFR calc Af Amer: 35 mL/min — ABNORMAL LOW (ref >60)
GFR calc non Af Amer: 30 mL/min — ABNORMAL LOW (ref >60)
Glucose, Bld: 110 mg/dL — ABNORMAL HIGH (ref 70–99)
Potassium: 4 mmol/L (ref 3.5–5.1)
Sodium: 140 mmol/L (ref 135–145)

## 2020-02-01 LAB — SARS CORONAVIRUS 2 (TAT 6-24 HRS): SARS Coronavirus 2: NEGATIVE

## 2020-02-01 LAB — MAGNESIUM: Magnesium: 2.2 mg/dL (ref 1.7–2.4)

## 2020-02-01 MED ORDER — TORSEMIDE 20 MG PO TABS
40.0000 mg | ORAL_TABLET | Freq: Two times a day (BID) | ORAL | Status: DC
  Administered 2020-02-01: 40 mg via ORAL
  Filled 2020-02-01: qty 2

## 2020-02-01 MED ORDER — TORSEMIDE 20 MG PO TABS: 40.0000 mg | ORAL_TABLET | Freq: Two times a day (BID) | ORAL | 0 refills | Status: DC

## 2020-02-01 MED ORDER — POTASSIUM CHLORIDE CRYS ER 20 MEQ PO TBCR
20.0000 meq | EXTENDED_RELEASE_TABLET | Freq: Every day | ORAL | Status: DC
  Administered 2020-02-01: 20 meq via ORAL
  Filled 2020-02-01: qty 1

## 2020-02-01 MED ORDER — INSULIN ASPART PROT & ASPART (70-30 MIX) 100 UNIT/ML ~~LOC~~ SUSP
125.0000 [IU] | Freq: Every day | SUBCUTANEOUS | Status: DC
  Administered 2020-02-01: 125 [IU] via SUBCUTANEOUS
  Filled 2020-02-01: qty 10

## 2020-02-01 NOTE — TOC Transition Note (Signed)
Transition of Care [facility]) - CM/SW Discharge Note   Patient Details  Name: Justin Hill MRN: [id number] Date of Birth: 1941-10-01  [facility]) CM/SW Contact:  Alberteen Sam, LCSW Phone Number: 02/01/2020, 12:35 PM   Clinical Narrative:     Patient will DC to: [facility] Anticipated DC date: 02/01/20 Family notified: [name] Transport [id number]  Per MD patient ready for DC to [facility] . RN, patient, patient's family, and facility notified of DC. Discharge Summary sent to facility. RN given number for report  [phone]  . DC packet on chart. Ambulance transport requested for patient.  CSW signing off.  [place], [place]   Final next level of care: Skilled Nursing Facility Barriers to Discharge: No Barriers Identified   Patient Goals and CMS Choice   CMS Medicare.gov Compare Post Acute Care list provided to:: Patient Represenative (must comment)(son [name]) Choice offered to / list presented to : Adult Children  Discharge Placement PASRR number recieved: 01/27/20            Patient chooses bed at: [facility] [place] Patient to be transferred to facility by: [place] Name of family member notified: [name] Patient and family notified of of transfer: 02/01/20  Discharge Plan and Services     Post Acute Care Choice: [place]                               Social Determinants of Health (SDOH) Interventions     Readmission Risk Interventions No flowsheet data found.

## 2020-02-01 NOTE — Discharge Summary (Signed)
Physician Discharge Summary  DALLIN MCCORKEL ZOX:[id number] DOB: [birth year] DOA: 01/24/2020  PCP: Serita Grammes, MD  Admit date: 01/24/2020 Discharge date: 02/01/2020  Admitted From: Home Disposition:  SNF   Recommendations for Outpatient Follow-up:  1. Follow up with PCP in 1 week 2. Follow up with Cardiology as scheduled on 02/14/2020   Discharge Condition: Stable CODE STATUS: Full  Diet recommendation: Heart healthy   Brief/Interim Summary: [place] Sardo McDonaldis a 79 y.o. male with past medical history chronic systolic CHF last EF measured was 55 to 60% on May 2020 (resolved), s/p ablation of atrial flutter, hx of A. fib on Xarelto, s/p pacer, DM type II, controlled with complication, normal pressure hydrocephalus s/p VP shunt, CKD stage IIIb (baseline Cr 1.8), OSA on CPAP who was referred from the primary care office after patient has become increasingly short of breath. Patient states over the last 1 week, patient has become increasingly short of breath, denies any chest pain, productive cough, fever, chills. Denies any nausea, vomiting, or diarrhea.  ED Course:In the ER, chest x-ray does not show anything acute on exam. Patient has bilateral lower extremity edema, elevated JVD, BNP was 35.1, high-sensitivity troponin was 9. CBC shows anemia and thrombocytopenia which is chronic. Creatinine is increased to 2.1 with baseline around 1.8-1.9. Patient was given Lasix 60 mg IV and admitted for acute CHF. Covid test was negative.  Interim: Cardiology was consulted. Patient was diuresed and monitored kidney function closely.   Discharge Diagnoses:  Principal Problem:   Acute CHF (congestive heart failure) (HCC) Active Problems:   Hypertension   CKD (chronic kidney disease) stage 3, GFR 30-59 ml/min   Essential hypertension   CAD- mild, non obstructive CAD 2008   Atrial flutter (HCC)   Acute systolic CHF (congestive heart failure) (HCC)   OSA on CPAP   Diabetes mellitus type 2,  controlled, with complications (HCC)   Acute on chronic diastolic CHF (congestive heart failure) (HCC)   Acute on chronic diastolic CHF/prior nonischemic cardiomyopathy  -Continue torsemide 40mg  BID, coreg 25mg  BID. Entresto discontinued by cardiology  -Follow up with cardiology   Hx LBBB/cardiomyopathy -S/p Medtronic CRT-D device placement  Paroxysmal atrial fibrillation -Continue Xarelto  Acute on CKD stage IIIb  -Baseline Cr 1.85 -Stable now, Cr 2.06 on day of discharge   DM type II controlled with complication -Hemoglobin A1c 6.4 -Continue NPH 70-30   Obese  -Estimated body mass index is 37.92 kg/m as calculated from the following:   Height as of this encounter: 5\' 11"  (1.803 m).   Weight as of this encounter: 123.3 kg.  OSA/OHS -CPAP qhs   Acute metabolic encephalopathy -Most likely multifactorial, acute on chronic systolic CHF, OSA/OHS -Resolved, back to baseline this morning   Normal pressure hydrocephalus -S/p VP shunt placement  Chronic normocytic anemia -Anemia panel most consistent with normocytic anemia  Thrombocytopenia -No signs of overt bleeding    Discharge Instructions  Discharge Instructions    Diet - low sodium heart healthy   Complete by: As directed    Increase activity slowly   Complete by: As directed      Allergies as of 02/01/2020      Reactions   Penicillins Anaphylaxis, Other (See Comments)   SYNCOPE PATIENT HAS HAD A PCN REACTION WITH IMMEDIATE RASH, FACIAL/TONGUE/THROAT SWELLING, SOB, OR LIGHTHEADEDNESS WITH HYPOTENSION:  #  #  #  YES  #  #  #   Has patient had a PCN reaction causing severe rash involving mucus membranes or skin  necrosis: no PATIENT HAS HAD A PCN REACTION THAT REQUIRED HOSPITALIZATION:  #  #  #  YES  #  #  #  Has patient had a PCN reaction occurring within the last 10 years: no   Aspirin Other (See Comments)   Burps, burning, stomach pains, etc   Lisinopril Other (See Comments), Cough   Severe  coughing      Medication List    STOP taking these medications   diphenhydramine-acetaminophen 25-500 MG Tabs tablet Commonly known as: TYLENOL PM   sacubitril-valsartan 24-26 MG Commonly known as: ENTRESTO     TAKE these medications   acetaminophen 500 MG tablet Commonly known as: TYLENOL Take 500-1,000 mg by mouth every 6 (six) hours as needed for mild pain or headache.   carvedilol 25 MG tablet Commonly known as: COREG TAKE 1 TABLET BY MOUTH TWICE DAILY WITH MEALS   cholecalciferol 25 MCG (1000 UNIT) tablet Commonly known as: VITAMIN D3 Take 1,000 Units by mouth daily.   Cinnamon 500 MG Tabs Take 1 tablet (500 mg total) by mouth daily.   Claritin 10 MG tablet Generic drug: loratadine Take 20 mg by mouth daily.   fenofibrate 54 MG tablet Take 1 tablet by mouth once daily   insulin NPH-regular Human (70-30) 100 UNIT/ML injection 150 units with breakfast, and 30 units with supper, and syringes 2/day What changed:   how much to take  how to take this  when to take this  additional instructions   multivitamin tablet Take 1 tablet by mouth daily.   omeprazole 40 MG capsule Commonly known as: PRILOSEC Take 40 mg by mouth daily. OTC   Rivaroxaban 15 MG Tabs tablet Commonly known as: Xarelto Take 1 tablet (15 mg total) by mouth daily with supper.   simvastatin 40 MG tablet Commonly known as: ZOCOR Take 1 tablet (40 mg total) by mouth at bedtime.   tadalafil 5 MG tablet Commonly known as: CIALIS Take 5 mg by mouth every evening.   tamsulosin 0.4 MG Caps capsule Commonly known as: FLOMAX Take 0.4 mg by mouth daily.   torsemide 20 MG tablet Commonly known as: DEMADEX Take 2 tablets (40 mg total) by mouth 2 (two) times daily. What changed:   medication strength  how much to take  when to take this  additional instructions   Trelegy Ellipta 100-62.5-25 MCG/INH Aepb Generic drug: Fluticasone-Umeclidin-Vilant Inhale 1 puff into the lungs  daily.   vitamin C 1000 MG tablet Take 1 tablet by mouth daily.      Follow-up Information    [facility] Follow up on 02/14/2020.   Specialty: Cardiology Why: Advanced Heart Failure Clinic 2:30 PM  Parking Garage Code [phone]  Contact information: [address] [phone] mc [place]       Serita Grammes, MD. Schedule an appointment as soon as possible for a visit in 1 week(s).   Specialty: Family Medicine Contact information: [place] [address] [phone]          Allergies  Allergen Reactions  . Penicillins Anaphylaxis and Other (See Comments)    SYNCOPE PATIENT HAS HAD A PCN REACTION WITH IMMEDIATE RASH, FACIAL/TONGUE/THROAT SWELLING, SOB, OR LIGHTHEADEDNESS WITH HYPOTENSION:  #  #  #  YES  #  #  #   Has patient had a PCN reaction causing severe rash involving mucus membranes or skin necrosis: no PATIENT HAS HAD A PCN REACTION THAT REQUIRED HOSPITALIZATION:  #  #  #  YES  #  #  #  Has patient had a PCN reaction occurring within the last 10 years: no    . Aspirin Other (See Comments)    Burps, burning, stomach pains, etc  . Lisinopril Other (See Comments) and Cough    Severe coughing    Consultations:  Cardiology, advanced heart failure   Procedures/Studies: CARDIAC CATHETERIZATION  Result Date: 01/26/2020 1. Mildly elevated PCWP, normal RA pressure. 2. Mild pulmonary venous hypertension. 3. Normal cardiac output.   DG Chest Portable 1 View  Result Date: 01/24/2020 CLINICAL DATA:  Dyspnea. EXAM: PORTABLE CHEST 1 VIEW COMPARISON:  July 18, 2018. FINDINGS: The heart size and mediastinal contours are within normal limits. Both lungs are clear. No pneumothorax or pleural effusion is noted. Right-sided ventriculoperitoneal shunt is again noted. Stable left-sided pacemaker. The visualized skeletal structures are unremarkable. IMPRESSION: No active  disease. Electronically Signed   By: Marijo Conception M.D.   On: 01/24/2020 14:50   ECHOCARDIOGRAM COMPLETE  Result Date: 01/25/2020    ECHOCARDIOGRAM REPORT   Patient Name:   LUMIR DEMETRIOU [facility] Date of Exam: 01/25/2020 Medical Rec #:  [id number]          Height:       71.0 in Accession #:    [id number]         Weight:       269.2 lb Date of Birth:  [birth year]           BSA:          2.392 m Patient Age:    65 years           BP:           116/50 mmHg Patient Gender: M                  HR:           87 bpm. Exam Location:  Inpatient Procedure: 2D Echo, Cardiac Doppler and Color Doppler Indications:    CHF-Acute Systolic 295.28/U13.24  History:        Patient has prior history of Echocardiogram examinations, most                 recent 04/13/2019. CHF, CAD, Arrythmias:Atrial Flutter; Risk                 Factors:Former Smoker, Diabetes, Hypertension, Sleep Apnea and                 Dyslipidemia. CKD. Nonischemic cardiomyopathy.  Sonographer:    Clayton Lefort RDCS (AE) Referring Phys: [id number] [place] Comments: Patient is morbidly obese. Image acquisition challenging due to patient body habitus. IMPRESSIONS  1. Left ventricular ejection fraction, by estimation, is 55%. The left ventricle has normal function. The left ventricle has no regional wall motion abnormalities. There is mild left ventricular hypertrophy. Left ventricular diastolic parameters are indeterminate.  2. Right ventricular systolic function is normal. The right ventricular size is mildly enlarged. Tricuspid regurgitation signal is inadequate for assessing PA pressure.  3. Right atrial size was mildly dilated.  4. The mitral valve is normal in structure and function. No evidence of mitral valve regurgitation. No evidence of mitral stenosis.  5. The aortic valve is tricuspid. Aortic valve regurgitation is not visualized. Mild aortic valve sclerosis is present, with no evidence of aortic valve stenosis.  6. The inferior vena cava is normal  in size with greater than 50% respiratory variability, suggesting right atrial pressure of  3 mmHg. FINDINGS  Left Ventricle: Left ventricular ejection fraction, by estimation, is 55%. The left ventricle has normal function. The left ventricle has no regional wall motion abnormalities. The left ventricular internal cavity size was normal in size. There is mild left ventricular hypertrophy. Left ventricular diastolic parameters are indeterminate. Right Ventricle: The right ventricular size is mildly enlarged. No increase in right ventricular wall thickness. Right ventricular systolic function is normal. Tricuspid regurgitation signal is inadequate for assessing PA pressure. Left Atrium: Left atrial size was normal in size. Right Atrium: Right atrial size was mildly dilated. Pericardium: There is no evidence of pericardial effusion. Mitral Valve: The mitral valve is normal in structure and function. No evidence of mitral valve regurgitation. No evidence of mitral valve stenosis. Tricuspid Valve: The tricuspid valve is normal in structure. Tricuspid valve regurgitation is not demonstrated. Aortic Valve: The aortic valve is tricuspid. Aortic valve regurgitation is not visualized. Mild aortic valve sclerosis is present, with no evidence of aortic valve stenosis. Aortic valve mean gradient measures 4.0 mmHg. Aortic valve peak gradient measures 6.9 mmHg. Aortic valve area, by VTI measures 2.32 cm. Pulmonic Valve: The pulmonic valve was normal in structure. Pulmonic valve regurgitation is not visualized. Aorta: The aortic root is normal in size and structure. Venous: The inferior vena cava is normal in size with greater than 50% respiratory variability, suggesting right atrial pressure of 3 mmHg. IAS/Shunts: No atrial level shunt detected by color flow Doppler. Additional Comments: A pacer wire is visualized in the right ventricle.  LEFT VENTRICLE PLAX 2D LVIDd:         4.30 cm LVIDs:         2.85 cm LV PW:         1.43 cm  LV IVS:        1.35 cm LVOT diam:     2.00 cm LV SV:         62 LV SV Index:   26 LVOT Area:     3.14 cm  RIGHT VENTRICLE RV Basal diam:  4.14 cm RV Mid diam:    3.96 cm RV S prime:     13.10 cm/s TAPSE (M-mode): 2.6 cm LEFT ATRIUM           Index       RIGHT ATRIUM           Index LA diam:      3.20 cm 1.34 cm/m  RA Area:     23.00 cm LA Vol (A2C): 54.9 ml 22.95 ml/m RA Volume:   75.70 ml  31.64 ml/m LA Vol (A4C): 27.4 ml 11.45 ml/m  AORTIC VALVE AV Area (Vmax):    2.45 cm AV Area (Vmean):   2.13 cm AV Area (VTI):     2.32 cm AV Vmax:           131.00 cm/s AV Vmean:          93.700 cm/s AV VTI:            0.265 m AV Peak Grad:      6.9 mmHg AV Mean Grad:      4.0 mmHg LVOT Vmax:         102.00 cm/s LVOT Vmean:        63.500 cm/s LVOT VTI:          0.196 m LVOT/AV VTI ratio: 0.74  AORTA Ao Root diam: 3.20 cm Ao Asc diam:  3.30 cm  SHUNTS Systemic VTI:  0.20 m Systemic  Diam: 2.00 cm Loralie Champagne MD Electronically signed by Loralie Champagne MD Signature Date/Time: 01/25/2020/4:49:40 PM    Final        Discharge Exam: Vitals:   02/01/20 0836 02/01/20 0845  BP: (!) 131/53   Pulse: 81   Resp:    Temp:    SpO2: 93% 91%    General: Pt is alert, awake, not in acute distress Cardiovascular: RRR, S1/S2 +, no edema Respiratory: CTA bilaterally, no wheezing, no rhonchi, no respiratory distress, no conversational dyspnea, on room air Abdominal: Soft, NT, ND, bowel sounds + Extremities: no edema, no cyanosis Psych: Normal mood and affect, stable judgement and insight     The results of significant diagnostics from this hospitalization (including imaging, microbiology, ancillary and laboratory) are listed below for reference.     Microbiology: Recent Results (from the past 240 hour(s))  SARS CORONAVIRUS 2 (TAT 6-24 HRS) Nasopharyngeal Nasopharyngeal Swab     Status: None   Collection Time: 01/24/20  1:17 PM   Specimen: Nasopharyngeal Swab  Result Value Ref Range Status   SARS Coronavirus 2  NEGATIVE NEGATIVE Final    Comment: (NOTE) SARS-CoV-2 target nucleic acids are NOT DETECTED. The SARS-CoV-2 RNA is generally detectable in upper and lower respiratory specimens during the acute phase of infection. Negative results do not preclude SARS-CoV-2 infection, do not rule out co-infections with other pathogens, and should not be used as the sole basis for treatment or other patient management decisions. Negative results must be combined with clinical observations, patient history, and epidemiological information. The expected result is Negative. Fact Sheet for Patients: SugarRoll.be Fact Sheet for Healthcare Providers: https://www.woods-mathews.com/ This test is not yet approved or cleared by the [place] FDA and  has been authorized for detection and/or diagnosis of SARS-CoV-2 by FDA under an Emergency Use Authorization (EUA). This EUA will remain  in effect (meaning this test can be used) for the duration of the COVID-19 declaration under Section 56 4(b)(1) of the Act, 21 U.S.C. section 360bbb-3(b)(1), unless the authorization is terminated or revoked sooner. Performed at [facility], [place] [address], [place], [place] [address]   SARS CORONAVIRUS 2 (TAT 6-24 HRS) Nasopharyngeal Nasopharyngeal Swab     Status: None   Collection Time: 01/29/20  2:21 PM   Specimen: Nasopharyngeal Swab  Result Value Ref Range Status   SARS Coronavirus 2 NEGATIVE NEGATIVE Final    Comment: (NOTE) SARS-CoV-2 target nucleic acids are NOT DETECTED. The SARS-CoV-2 RNA is generally detectable in upper and lower respiratory specimens during the acute phase of infection. Negative results do not preclude SARS-CoV-2 infection, do not rule out co-infections with other pathogens, and should not be used as the sole basis for treatment or other patient management decisions. Negative results must be combined with clinical observations, patient history,  and epidemiological information. The expected result is Negative. Fact Sheet for Patients: SugarRoll.be Fact Sheet for Healthcare Providers: https://www.woods-mathews.com/ This test is not yet approved or cleared by the [place] FDA and  has been authorized for detection and/or diagnosis of SARS-CoV-2 by FDA under an Emergency Use Authorization (EUA). This EUA will remain  in effect (meaning this test can be used) for the duration of the COVID-19 declaration under Section 56 4(b)(1) of the Act, 21 U.S.C. section 360bbb-3(b)(1), unless the authorization is terminated or revoked sooner. Performed at [facility], [place] [address] [place], [place] [address]   SARS CORONAVIRUS 2 (TAT 6-24 HRS) Nasopharyngeal Nasopharyngeal Swab     Status:  None   Collection Time: 01/31/20  7:56 PM   Specimen: Nasopharyngeal Swab  Result Value Ref Range Status   SARS Coronavirus 2 NEGATIVE NEGATIVE Final    Comment: (NOTE) SARS-CoV-2 target nucleic acids are NOT DETECTED. The SARS-CoV-2 RNA is generally detectable in upper and lower respiratory specimens during the acute phase of infection. Negative results do not preclude SARS-CoV-2 infection, do not rule out co-infections with other pathogens, and should not be used as the sole basis for treatment or other patient management decisions. Negative results must be combined with clinical observations, patient history, and epidemiological information. The expected result is Negative. Fact Sheet for Patients: SugarRoll.be Fact Sheet for Healthcare Providers: https://www.woods-mathews.com/ This test is not yet approved or cleared by the [place] FDA and  has been authorized for detection and/or diagnosis of SARS-CoV-2 by FDA under an Emergency Use Authorization (EUA). This EUA will remain  in effect (meaning this test can be used) for the duration of  the COVID-19 declaration under Section 56 4(b)(1) of the Act, 21 U.S.C. section 360bbb-3(b)(1), unless the authorization is terminated or revoked sooner. Performed at [facility], [place] [address], [place], [place] [address]      Labs: BNP (last 3 results) Recent Labs    01/24/20 1414  BNP 01.7   Basic Metabolic Panel: Recent Labs  Lab 01/28/20 0523 01/29/20 0336 01/30/20 0411 01/31/20 0758 02/01/20 0401  NA 144 145 141 140 140  K 3.7 3.6 3.7 3.6 4.0  CL 101 100 98 97* 101  CO2 31 32 32 29 30  GLUCOSE 147* 112* 187* 153* 110*  BUN 27* 32* 36* 32* 29*  CREATININE 2.33* 2.57* 2.83* 2.26* 2.06*  CALCIUM 9.2 9.0 9.1 9.1 9.4  MG 2.3 2.1 2.0 2.1 2.2  PHOS  --   --   --  3.9 3.3   Liver Function Tests: Recent Labs  Lab 01/31/20 0758  AST 17  ALT 16  ALKPHOS 35*  BILITOT 0.8  PROT 5.7*  ALBUMIN 3.3*   No results for input(s): LIPASE, AMYLASE in the last 168 hours. Recent Labs  Lab 01/28/20 0818  AMMONIA 25   CBC: Recent Labs  Lab 01/28/20 0523 01/29/20 0336 01/30/20 0411 01/31/20 0354 02/01/20 0401  WBC 6.1 7.6 7.0 7.6 6.3  NEUTROABS  --   --   --   --  4.2  HGB 10.8* 11.2* 10.8* 10.5* 10.7*  HCT 36.2* 37.0* 35.5* 33.9* 34.9*  MCV 83.8 83.5 82.8 82.5 82.1  PLT 134* 144* 136* 137* 116*   Cardiac Enzymes: No results for input(s): CKTOTAL, CKMB, CKMBINDEX, TROPONINI in the last 168 hours. BNP: Invalid input(s): POCBNP CBG: Recent Labs  Lab 01/31/20 1713 01/31/20 1957 02/01/20 0021 02/01/20 0409 02/01/20 0801  GLUCAP 163* 185* 95 127* 147*   D-Dimer No results for input(s): DDIMER in the last 72 hours. Hgb A1c No results for input(s): HGBA1C in the last 72 hours. Lipid Profile No results for input(s): CHOL, HDL, LDLCALC, TRIG, CHOLHDL, LDLDIRECT in the last 72 hours. Thyroid function studies No results for input(s): TSH, T4TOTAL, T3FREE, THYROIDAB in the last 72 hours.  Invalid input(s): FREET3 Anemia work up No results for  input(s): VITAMINB12, FOLATE, FERRITIN, TIBC, IRON, RETICCTPCT in the last 72 hours. Urinalysis No results found for: COLORURINE, APPEARANCEUR, LABSPEC, [place], GLUCOSEU, [place], [place], KETONESUR, PROTEINUR, UROBILINOGEN, NITRITE, LEUKOCYTESUR Sepsis Labs Invalid input(s): PROCALCITONIN,  WBC,  LACTICIDVEN Microbiology Recent Results (from the past 240 hour(s))  SARS CORONAVIRUS 2 (TAT 6-24  HRS) Nasopharyngeal Nasopharyngeal Swab     Status: None   Collection Time: 01/24/20  1:17 PM   Specimen: Nasopharyngeal Swab  Result Value Ref Range Status   SARS Coronavirus 2 NEGATIVE NEGATIVE Final    Comment: (NOTE) SARS-CoV-2 target nucleic acids are NOT DETECTED. The SARS-CoV-2 RNA is generally detectable in upper and lower respiratory specimens during the acute phase of infection. Negative results do not preclude SARS-CoV-2 infection, do not rule out co-infections with other pathogens, and should not be used as the sole basis for treatment or other patient management decisions. Negative results must be combined with clinical observations, patient history, and epidemiological information. The expected result is Negative. Fact Sheet for Patients: SugarRoll.be Fact Sheet for Healthcare Providers: https://www.woods-mathews.com/ This test is not yet approved or cleared by the [place] FDA and  has been authorized for detection and/or diagnosis of SARS-CoV-2 by FDA under an Emergency Use Authorization (EUA). This EUA will remain  in effect (meaning this test can be used) for the duration of the COVID-19 declaration under Section 56 4(b)(1) of the Act, 21 U.S.C. section 360bbb-3(b)(1), unless the authorization is terminated or revoked sooner. Performed at [facility], [place] [address], [place], [place] [address]   SARS CORONAVIRUS 2 (TAT 6-24 HRS) Nasopharyngeal Nasopharyngeal Swab     Status: None   Collection Time: 01/29/20  2:21 PM    Specimen: Nasopharyngeal Swab  Result Value Ref Range Status   SARS Coronavirus 2 NEGATIVE NEGATIVE Final    Comment: (NOTE) SARS-CoV-2 target nucleic acids are NOT DETECTED. The SARS-CoV-2 RNA is generally detectable in upper and lower respiratory specimens during the acute phase of infection. Negative results do not preclude SARS-CoV-2 infection, do not rule out co-infections with other pathogens, and should not be used as the sole basis for treatment or other patient management decisions. Negative results must be combined with clinical observations, patient history, and epidemiological information. The expected result is Negative. Fact Sheet for Patients: SugarRoll.be Fact Sheet for Healthcare Providers: https://www.woods-mathews.com/ This test is not yet approved or cleared by the [place] FDA and  has been authorized for detection and/or diagnosis of SARS-CoV-2 by FDA under an Emergency Use Authorization (EUA). This EUA will remain  in effect (meaning this test can be used) for the duration of the COVID-19 declaration under Section 56 4(b)(1) of the Act, 21 U.S.C. section 360bbb-3(b)(1), unless the authorization is terminated or revoked sooner. Performed at [facility], [place] [address], [place], [place] [address]   SARS CORONAVIRUS 2 (TAT 6-24 HRS) Nasopharyngeal Nasopharyngeal Swab     Status: None   Collection Time: 01/31/20  7:56 PM   Specimen: Nasopharyngeal Swab  Result Value Ref Range Status   SARS Coronavirus 2 NEGATIVE NEGATIVE Final    Comment: (NOTE) SARS-CoV-2 target nucleic acids are NOT DETECTED. The SARS-CoV-2 RNA is generally detectable in upper and lower respiratory specimens during the acute phase of infection. Negative results do not preclude SARS-CoV-2 infection, do not rule out co-infections with other pathogens, and should not be used as the sole basis for treatment or other patient management  decisions. Negative results must be combined with clinical observations, patient history, and epidemiological information. The expected result is Negative. Fact Sheet for Patients: SugarRoll.be Fact Sheet for Healthcare Providers: https://www.woods-mathews.com/ This test is not yet approved or cleared by the [place] FDA and  has been authorized for detection and/or diagnosis of SARS-CoV-2 by FDA under an Emergency Use Authorization (EUA). This EUA will  remain  in effect (meaning this test can be used) for the duration of the COVID-19 declaration under Section 56 4(b)(1) of the Act, 21 U.S.C. section 360bbb-3(b)(1), unless the authorization is terminated or revoked sooner. Performed at [facility], [place] [address], [place], [place] [address]      Patient was seen and examined on the day of discharge and was found to be in stable condition. Time coordinating discharge: 40 minutes including assessment and coordination of care, as well as examination of the patient.   SIGNED:  Dessa Phi, DO [facility] 02/01/2020, 10:13 AM

## 2020-02-01 NOTE — Progress Notes (Signed)
Report was given to [place], [employer].

## 2020-02-01 NOTE — Progress Notes (Signed)
Patient ID: Justin Hill, male   DOB: Apr 23, 1941, 79 y.o.   MRN: [id number]     Advanced Heart Failure Rounding Note  PCP-Cardiologist: Loralie Champagne, MD   Subjective:    Discharge held due to elevated creatinine.   Creatinine trending up 2.3>2.5>2.8>2.26>2.06.   No dyspnea. Memory remains poor.  Wife passed away on 6N last night.    Objective:   Weight Range: 123.3 kg Body mass index is 37.92 kg/m.   Vital Signs:   Temp:  [98.1 F (36.7 C)-98.5 F (36.9 C)] 98.4 F (36.9 C) (03/10 0411) Pulse Rate:  [81-107] 107 (03/10 0411) Resp:  [17-20] 18 (03/10 0411) BP: (107-125)/(58-90) 115/58 (03/10 0411) SpO2:  [93 %-98 %] 93 % (03/10 0411) Weight:  [123.2 kg-123.3 kg] 123.3 kg (03/10 0411) Last BM Date: 01/31/20  Weight change: Filed Weights   01/31/20 0058 01/31/20 0842 02/01/20 0411  Weight: 123.2 kg 123.2 kg 123.3 kg    Intake/Output:   Intake/Output Summary (Last 24 hours) at 02/01/2020 0831 Last data filed at 02/01/2020 0736 Gross per 24 hour  Intake 840 ml  Output 2275 ml  Net -1435 ml      Physical Exam   General: NAD Neck: JVP 8 cm, no thyromegaly or thyroid nodule.  Lungs: Clear to auscultation bilaterally with normal respiratory effort. CV: Nondisplaced PMI.  Heart regular S1/S2, no S3/S4, no murmur.  1+ ankle edema.   Abdomen: Soft, nontender, no hepatosplenomegaly, no distention.  Skin: Intact without lesions or rashes.  Neurologic: Alert and oriented x 3.  Psych: Normal affect. Extremities: No clubbing or cyanosis.  HEENT: Normal.    Telemetry  NSR BIV pacing 80s   Labs    CBC Recent Labs    01/31/20 0354 02/01/20 0401  WBC 7.6 6.3  NEUTROABS  --  4.2  HGB 10.5* 10.7*  HCT 33.9* 34.9*  MCV 82.5 82.1  PLT 137* 188*   Basic Metabolic Panel Recent Labs    01/31/20 0758 02/01/20 0401  NA 140 140  K 3.6 4.0  CL 97* 101  CO2 29 30  GLUCOSE 153* 110*  BUN 32* 29*  CREATININE 2.26* 2.06*  CALCIUM 9.1 9.4  MG 2.1 2.2  PHOS  3.9 3.3   Liver Function Tests Recent Labs    01/31/20 0758  AST 17  ALT 16  ALKPHOS 35*  BILITOT 0.8  PROT 5.7*  ALBUMIN 3.3*   No results for input(s): LIPASE, AMYLASE in the last 72 hours. Cardiac Enzymes No results for input(s): CKTOTAL, CKMB, CKMBINDEX, TROPONINI in the last 72 hours.  BNP: BNP (last 3 results) Recent Labs    01/24/20 1414  BNP 35.1    ProBNP (last 3 results) No results for input(s): PROBNP in the last 8760 hours.   D-Dimer No results for input(s): DDIMER in the last 72 hours. Hemoglobin A1C No results for input(s): HGBA1C in the last 72 hours. Fasting Lipid Panel No results for input(s): CHOL, HDL, LDLCALC, TRIG, CHOLHDL, LDLDIRECT in the last 72 hours. Thyroid Function Tests No results for input(s): TSH, T4TOTAL, T3FREE, THYROIDAB in the last 72 hours.  Invalid input(s): FREET3  Other results:   Imaging    No results found.   Medications:     Scheduled Medications: . vitamin C  1,000 mg Oral BID  . carvedilol  25 mg Oral BID WC  . cholecalciferol  1,000 Units Oral Daily  . fenofibrate  54 mg Oral Daily  . fluticasone furoate-vilanterol  1 puff Inhalation Daily  .  insulin aspart protamine- aspart  125 Units Subcutaneous Q breakfast  . insulin aspart protamine- aspart  15 Units Subcutaneous Q supper  . loratadine  10 mg Oral BID  . multivitamin with minerals  1 tablet Oral Daily  . pantoprazole  40 mg Oral Daily  . Rivaroxaban  15 mg Oral Q supper  . simvastatin  40 mg Oral QHS  . sodium chloride flush  3 mL Intravenous Q12H  . tamsulosin  0.4 mg Oral Daily  . umeclidinium bromide  1 puff Inhalation Daily    Infusions:   PRN Medications: acetaminophen **OR** acetaminophen, diphenhydrAMINE, ondansetron **OR** ondansetron (ZOFRAN) IV  Assessment/Plan   1. Acute on chronic diastolic CHF: Prior nonischemic cardiomyopathy.  Low EF x years. Echo 6/16 showed EF 25-30% with septal-lateral dyssynchrony.  He says that he was  told in the past that his cardiomyopathy may have been related to viral myocarditis.  Interestingly, it also appears that he has a long-standing LBBB. A chronic LBBB itself can potentially cause a cardiomyopathy and may be the source of his previous low EF. Coronary angiography in 6/17 with nonobstructive disease. Now s/p Medtronic CRT-D device placement.  Echo in 5/20 showed EF normalized, 55-60%.  Echo this admission showed EF still 55%.   He was admitted this time for persistent dyspnea despite increasing outpatient diuretics.  He got IV Lasix here, weight down but developed AKI on CKD with creatinine up to 2.8.  [place] 01/26/20 showed mildly elevated PCWP and normal RA pressure.  Creatinine now down to 2.06 after holding Lasix, probably mild volume overload on exam.  - Start back on torsemide, would use 40 mg po bid.   - He will stay off Entresto with normalized EF and AKI.    - Continue Coreg 25 mg bid.  2.  AKI on CKD stage 3: Suspect due to over-diuresis.  Creatinine baseline 1.6-1.8, 2.06 today.   3. Atrial flutter: s/p ablation, in NSR today.  4. Atrial fibrillation: Patient had afib noted as well as flutter. He is in NSR today.  - Continue Xarelto 15 mg daily.  5. Normal pressure hydrocephalus: Has VP shunt.  Significant memory deficit.  6. OSA: Continue to use CPAP.   Wife has passed away, do not think he can manage on his own at home, may need long-term SNF placement.  Note plan for [facility] nursing home discharge.   Length of Stay: 8  Loralie Champagne, MD  02/01/2020, 8:31 AM  Advanced Heart Failure Team Pager [phone] (M-F; 7a - 4p)  Please contact [facility] for night-coverage after hours (4p -7a ) and weekends on [email or web address]

## 2020-02-07 DIAGNOSIS — I5032 Chronic diastolic (congestive) heart failure: Secondary | ICD-10-CM | POA: Diagnosis not present

## 2020-02-07 DIAGNOSIS — I2510 Atherosclerotic heart disease of native coronary artery without angina pectoris: Secondary | ICD-10-CM | POA: Diagnosis not present

## 2020-02-07 DIAGNOSIS — R262 Difficulty in walking, not elsewhere classified: Secondary | ICD-10-CM | POA: Diagnosis not present

## 2020-02-07 DIAGNOSIS — I4891 Unspecified atrial fibrillation: Secondary | ICD-10-CM | POA: Diagnosis not present

## 2020-02-10 NOTE — Progress Notes (Signed)
No ICM remote transmission received for 02/06/2020 and next ICM transmission scheduled for 02/23/2020.

## 2020-02-13 DIAGNOSIS — R413 Other amnesia: Secondary | ICD-10-CM | POA: Diagnosis not present

## 2020-02-13 DIAGNOSIS — I129 Hypertensive chronic kidney disease with stage 1 through stage 4 chronic kidney disease, or unspecified chronic kidney disease: Secondary | ICD-10-CM | POA: Diagnosis not present

## 2020-02-13 DIAGNOSIS — N200 Calculus of kidney: Secondary | ICD-10-CM | POA: Diagnosis not present

## 2020-02-13 DIAGNOSIS — I5022 Chronic systolic (congestive) heart failure: Secondary | ICD-10-CM | POA: Diagnosis not present

## 2020-02-13 DIAGNOSIS — D509 Iron deficiency anemia, unspecified: Secondary | ICD-10-CM | POA: Diagnosis not present

## 2020-02-13 DIAGNOSIS — N401 Enlarged prostate with lower urinary tract symptoms: Secondary | ICD-10-CM | POA: Diagnosis not present

## 2020-02-13 DIAGNOSIS — E1122 Type 2 diabetes mellitus with diabetic chronic kidney disease: Secondary | ICD-10-CM | POA: Diagnosis not present

## 2020-02-13 DIAGNOSIS — N1832 Chronic kidney disease, stage 3b: Secondary | ICD-10-CM | POA: Diagnosis not present

## 2020-02-14 ENCOUNTER — Other Ambulatory Visit: Payer: Self-pay

## 2020-02-14 ENCOUNTER — Ambulatory Visit (HOSPITAL_COMMUNITY)
Admission: RE | Admit: 2020-02-14 | Discharge: 2020-02-14 | Disposition: A | Discharge status: Home / Self Care | Payer: Medicare Other | Source: Ambulatory Visit | Attending: Cardiology | Admitting: Cardiology

## 2020-02-14 ENCOUNTER — Encounter (HOSPITAL_COMMUNITY): Payer: Self-pay

## 2020-02-14 VITALS — BP 156/74 | HR 78 | Wt 273.0 lb

## 2020-02-14 DIAGNOSIS — Z87891 Personal history of nicotine dependence: Secondary | ICD-10-CM | POA: Insufficient documentation

## 2020-02-14 DIAGNOSIS — Z7901 Long term (current) use of anticoagulants: Secondary | ICD-10-CM | POA: Diagnosis not present

## 2020-02-14 DIAGNOSIS — E1142 Type 2 diabetes mellitus with diabetic polyneuropathy: Secondary | ICD-10-CM | POA: Insufficient documentation

## 2020-02-14 DIAGNOSIS — Z8249 Family history of ischemic heart disease and other diseases of the circulatory system: Secondary | ICD-10-CM | POA: Diagnosis not present

## 2020-02-14 DIAGNOSIS — N183 Chronic kidney disease, stage 3 unspecified: Secondary | ICD-10-CM | POA: Insufficient documentation

## 2020-02-14 DIAGNOSIS — I4892 Unspecified atrial flutter: Secondary | ICD-10-CM | POA: Diagnosis not present

## 2020-02-14 DIAGNOSIS — I447 Left bundle-branch block, unspecified: Secondary | ICD-10-CM | POA: Insufficient documentation

## 2020-02-14 DIAGNOSIS — Z6838 Body mass index (BMI) 38.0-38.9, adult: Secondary | ICD-10-CM | POA: Diagnosis not present

## 2020-02-14 DIAGNOSIS — E669 Obesity, unspecified: Secondary | ICD-10-CM | POA: Insufficient documentation

## 2020-02-14 DIAGNOSIS — Z982 Presence of cerebrospinal fluid drainage device: Secondary | ICD-10-CM | POA: Diagnosis not present

## 2020-02-14 DIAGNOSIS — I428 Other cardiomyopathies: Secondary | ICD-10-CM | POA: Insufficient documentation

## 2020-02-14 DIAGNOSIS — Z95810 Presence of automatic (implantable) cardiac defibrillator: Secondary | ICD-10-CM | POA: Diagnosis not present

## 2020-02-14 DIAGNOSIS — I509 Heart failure, unspecified: Secondary | ICD-10-CM | POA: Insufficient documentation

## 2020-02-14 DIAGNOSIS — I5022 Chronic systolic (congestive) heart failure: Secondary | ICD-10-CM

## 2020-02-14 DIAGNOSIS — G912 (Idiopathic) normal pressure hydrocephalus: Secondary | ICD-10-CM | POA: Insufficient documentation

## 2020-02-14 DIAGNOSIS — I130 Hypertensive heart and chronic kidney disease with heart failure and stage 1 through stage 4 chronic kidney disease, or unspecified chronic kidney disease: Secondary | ICD-10-CM | POA: Insufficient documentation

## 2020-02-14 DIAGNOSIS — I2510 Atherosclerotic heart disease of native coronary artery without angina pectoris: Secondary | ICD-10-CM | POA: Diagnosis not present

## 2020-02-14 DIAGNOSIS — Z9989 Dependence on other enabling machines and devices: Secondary | ICD-10-CM

## 2020-02-14 DIAGNOSIS — Z823 Family history of stroke: Secondary | ICD-10-CM | POA: Insufficient documentation

## 2020-02-14 DIAGNOSIS — Z79899 Other long term (current) drug therapy: Secondary | ICD-10-CM | POA: Insufficient documentation

## 2020-02-14 DIAGNOSIS — E1122 Type 2 diabetes mellitus with diabetic chronic kidney disease: Secondary | ICD-10-CM | POA: Insufficient documentation

## 2020-02-14 DIAGNOSIS — I480 Paroxysmal atrial fibrillation: Secondary | ICD-10-CM | POA: Diagnosis not present

## 2020-02-14 DIAGNOSIS — E785 Hyperlipidemia, unspecified: Secondary | ICD-10-CM | POA: Diagnosis not present

## 2020-02-14 DIAGNOSIS — G4733 Obstructive sleep apnea (adult) (pediatric): Secondary | ICD-10-CM | POA: Insufficient documentation

## 2020-02-14 DIAGNOSIS — Z794 Long term (current) use of insulin: Secondary | ICD-10-CM | POA: Diagnosis not present

## 2020-02-14 NOTE — Progress Notes (Signed)
Patient ID: Justin Hill, male   DOB: 1941-08-17, 79 y.o.   MRN: [id number] PCP: Dr. Camillia Herter Cardiology: Dr. Aundra Dubin  79 y.o. with history CKD, HTN, diabetes, chronic LBBB, and nonischemic cardiomyopathy presents for followup of CHF.  His cardiomyopathy has been known for years.  He had a LHC in 2008 showing mild nonobstructive CAD.     [name] was admitted in 2/16 with atrial flutter degenerating into atrial fibrillation.  He was initially cardioverted, then atrial flutter recurred.  He had atrial flutter ablation by Dr Rayann Heman in 2/16.  He thinks that he has been in NSR since that time, no further palpitations. He is on Xarelto now with no melena or BRBPR.    He is s/p placement of Medtronic CRT-D device.  Echo in 1/18 showed improvement in EF to 40-45%.  Echo in 5/20 showed EF up to 55-60%.   He developed normal pressure hydrocephalus and had VP shunt placed.  This has helped with his walking and some with his memory.   Admitted March 2774 with A/C Systolic HF. Given IV Lasix here, weight down but developed AKI on CKD with creatinine up to 2.8.  [place] 01/26/20 showed mildly elevated PCWP and normal RA pressure.  Creatinine went back down to 2.06 after holding Lasix. He was restarted on torsemide 40 mg twice a day. Delene Loll was not restarted with normalized EF and AKI.  Today he returns for post HF follow up. Yesterday he was seen by Nephrology and he was instructed to take metolazone for 2 days. Overall feeling ok other than ongoing shortness of breath.  SOB with exertion and ADLS. Denies PND/Orthopnea. Using CPAP at night.  Appetite ok. Has been eating some take out.  No fever or chills. Not weighing at home. Taking all medications. Plans to move to [place] with his son.   Medtronic device interrogation: No VT. Activity. Heart Variability noted. Impedance trending up.   Labs (5/14): K 4, creatinine 1.4, LFTs normal, LDL 46, HDL 37 Labs (1/15): K 4.4, creatinine 1.46, LDL 74, HDL  35 Labs (4/15); LDL 96, HDL 44, K 5.2, creatinine 1.4 Labs (1/16): K 4.9, creatinine 1.36 Labs (2/16): K 4.5, creatinine 1.56, BNP 412 Labs (9/16): K 4.8, creatinine 1.33, hgb 14.7 Labs (11/16): K 4.3, creatinine 1.37, BNP 52 Labs (1/17): K 4.5 => 5, creatinine 1.35 => 1.17, HCT 47.4 Labs (2/17): K 4.9, creatinine 1.35 Labs (5/17): LDL 48, LFTs normal Labs (9/17): K 5, creatinine 1.21 Labs (11/17): HCT 41.4, digoxin 0.3, K 4.9, creatinine 1.27 Labs (5/18): K 5.2, creatinine 1.47 Labs (7/18): digoxin 0.3 Labs (9/18): LDL 82, HDL 49, K 4.5, creatinine 1.34 Labs (3/19): digoxin 0.4, K 4.5, creatinine 1.42 Labs (6/19): K 5.2, LDL 76 Labs (10/19): K 5, creatinine 1.57 Labs (11/19): creatinine 1.9 Labs (12/19): K 4.5, creatinine 1.66 Labs (8/20): K 4.8, creatinine 1.74, hgb 11.7 Labs (11/20): LDL 71 Labs (1/21): K 4.3, creatinine 1.95 Labs (02/01/20): K 4 creatinine 2.06   PMH: 1. Type II diabetes with peripheral neuropathy and nephropathy.  2. CKD stage 3 3. Hyperlipidemia 4. HTN: ACEI cough.  5. Nonischemic cardiomyopathy: This was diagnosed prior to 2008.  In 2008, patient had LHC with only mild nonobstructive coronary disease.  Echo in 1/14 showed EF 25-30% with normal RV.  Patient has not tolerated ACEI due to hyperkalemia and AKI. ICD was discussed (Dr Lovena Le) and decided against given NYHA class I symptoms.  Echo (7/15) with EF 25-30%, septal-lateral dyssynchrony, mild LV dilation. Echo (  6/16) with EF 25-30%, mild LVH, septal-lateral dyssynchrony, normal RV size and systolic function. CPX (12/16) with peak VO2 14.5 (69% predicted), VE/VCO2 34, RER 1.19 => mild functional impairment from heart failure, restrictive lung function likely due to body habitus.  - LHC/RHC (6/17): 30% mid LAD stenosis; mean RA 5, PA 33/10, PCWP mean 16, CI 1.95.  - RHC 01/3020: mildly elevated PCWP and normal RA pressure. - Medtronic CRT-D placed 7/17.  - Echo (1/18): EF 40-45%, mild LVH.  - Echo (3/19): EF  40-45%, moderate LVH, normal RV size/function.  - Echo (5/20): EF 55-60%, normal RV size and systolic function.  6. Chronic LBBB 7. Obesity 8. Atrial flutter and atrial fibrillation: S/p atrial flutter ablation in 2/16 (Allred).  9. Normal pressure hydrocephalus: s/p VP shunt.  10. OSA: Uses CPAP.   SH: Lives in [place], married, prior smoker, no ETOH.  1 child.   FH: Grandmother with CHF.  Mother with CVA.   ROS: All systems reviewed and negative except as per HPI.    Current Outpatient Medications  Medication Sig Dispense Refill  . acetaminophen (TYLENOL) 500 MG tablet Take 500-1,000 mg by mouth every 6 (six) hours as needed for mild pain or headache.     . Ascorbic Acid (VITAMIN C) 1000 MG tablet Take 1 tablet by mouth daily.     . carvedilol (COREG) 25 MG tablet TAKE 1 TABLET BY MOUTH TWICE DAILY WITH MEALS (Patient taking differently: Take 25 mg by mouth 2 (two) times daily with a meal. ) 180 tablet 0  . cholecalciferol (VITAMIN D3) 25 MCG (1000 UNIT) tablet Take 1,000 Units by mouth daily.    . Cinnamon 500 MG TABS Take 1 tablet (500 mg total) by mouth daily.    . fenofibrate 54 MG tablet Take 1 tablet by mouth once daily 90 tablet 0  . Fluticasone-Umeclidin-Vilant (TRELEGY ELLIPTA) 100-62.5-25 MCG/INH AEPB Inhale 1 puff into the lungs daily.    . insulin NPH-regular Human (70-30) 100 UNIT/ML injection 150 units with breakfast, and 30 units with supper, and syringes 2/day (Patient taking differently: Inject 50-150 Units into the skin See admin instructions. Inject 150 units in the morning and 50 units at bedtime.) 100 mL 11  . loratadine (CLARITIN) 10 MG tablet Take 20 mg by mouth daily.     . metolazone (ZAROXOLYN) 5 MG tablet Take 5 mg by mouth as directed.    . Multiple Vitamin (MULTIVITAMIN) tablet Take 1 tablet by mouth daily.      Marland Kitchen omeprazole (PRILOSEC) 40 MG capsule Take 40 mg by mouth daily. OTC    . Rivaroxaban (XARELTO) 15 MG TABS tablet Take 1 tablet (15 mg total) by  mouth daily with supper. 30 tablet 6  . simvastatin (ZOCOR) 40 MG tablet Take 1 tablet (40 mg total) by mouth at bedtime. 90 tablet 3  . tadalafil (CIALIS) 5 MG tablet Take 5 mg by mouth every evening.   3  . tamsulosin (FLOMAX) 0.4 MG CAPS capsule Take 0.4 mg by mouth daily.    Marland Kitchen torsemide (DEMADEX) 20 MG tablet Take 2 tablets (40 mg total) by mouth 2 (two) times daily. 120 tablet 0   No current facility-administered medications for this encounter.    BP (!) 156/74   Pulse 78   Wt 123.8 kg (273 lb)   SpO2 98%   BMI 38.08 kg/m   Wt Readings from Last 3 Encounters:  02/14/20 123.8 kg (273 lb)  02/01/20 123.3 kg (271 lb 14.4 oz)  01/18/20 126.7 kg (279 lb 6.4 oz)    General:  Walked slowly in the clinic.  No resp difficulty HEENT: normal Neck: supple. JVP 9-10 . Carotids 2+ bilat; no bruits. No lymphadenopathy or thryomegaly appreciated. Cor: PMI nondisplaced. Regular rate & rhythm. No rubs, gallops or murmurs. Lungs: clear Abdomen: soft, nontender, distended. No hepatosplenomegaly. No bruits or masses. Good bowel sounds. Extremities: no cyanosis, clubbing, rash, R and LLE 1+ edema Neuro: alert & orientedx3, cranial nerves grossly intact. moves all 4 extremities w/o difficulty. Affect pleasant .   Assessment/Plan: 1. Cardiomyopathy: Nonischemic.  Low EF x years. Echo 6/16 showed stable EF 25-30% with septal-lateral dyssynchrony.  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  Interestingly, it also appears that he has a long-standing LBBB.  A chronic LBBB itself can potentially cause a cardiomyopathy and may be the source of his low EF.   Coronary angiography in 6/17 with nonobstructive disease. S/P Medtronic CRT-D device placement.  Most recent echo in 5/20 showed EF 55% RV normal.   - NYHA III. Volume status stable. Continue torsemide 40 mg twice a day. Metolazone added for 2 days per nephrology.  Plan to check BMET next week  at PCP with results faxed to clinic.  - Continue Coreg 25 mg bid.  - Off entresto with recent AKI.   2. Hyperlipidemia: Patient is on simvastatin.  Good lipids in 11/20.  3. CKD: Stage 3.  Followed by nephrology. Check BMET next at his PCP  4. Atrial flutter: s/p ablation  5. PAF:  -Continue carvedilol 25 mg twicea day.  - Continue Xarelto 15 mg daily.  6. Normal pressure hydrocephalus: Has VP shunt.   7. OSA: Continue to use CPAP.  8. Obesity Body mass index is 38.08 kg/m. Discussed portion control.   Follow up 2 weeks with virtual visit. BMET by his PCP. Greater than 50% of the (total minutes 25) visit spent in counseling/coordination of care regarding Hf diet, weights, and referral to [facility] HF program.   Kyasia Steuck NP-C  02/14/2020

## 2020-02-14 NOTE — Patient Instructions (Signed)
Please get lab work done in 1 week at your Primary Care Physicians office  Your physician recommends that you schedule a follow-up appointment in: 1 week with Virtual Visit  You have been referred to Dr Debara Pickett at [facility]  If you have any questions or concerns before your next appointment please send [place] a message through [place] or call our office at [phone].  At the [facility], you and your health needs are our priority. As part of our continuing mission to provide you with exceptional heart care, we have created designated Provider Care Teams. These Care Teams include your primary Cardiologist (physician) and Advanced Practice Providers (APPs- Physician Assistants and Nurse Practitioners) who all work together to provide you with the care you need, when you need it.   You may see any of the following providers on your designated Care Team at your next follow up: Marland Kitchen Dr Glori Bickers . Dr Loralie Champagne . Darrick Grinder, NP . Lyda Jester, PA . Audry Riles, PharmD   Please be sure to bring in all your medications bottles to every appointment.

## 2020-02-15 ENCOUNTER — Other Ambulatory Visit: Payer: Self-pay | Admitting: *Deleted

## 2020-02-15 NOTE — Patient Outreach (Signed)
Member screened for potential [facility] Care Management needs as a benefit of [employer].  Verified in Patient Justin Hill that Justin Hill discharged from [facility] on 02/11/20.  Telephone call made to member's son Justin Hill [phone]. Patient identifiers confirmed. Justin Hill affirms that his mother/member's wife recently passed. States his mother was Justin Hill caregiver. Since she has passed, Justin Hill (son) has moved Justin Hill to [place], [place] to live with him. States he is in the process of getting Justin Hill's medical records transferred and finding new physicians  within [facility] in [place], [place].   Member's son expressed appreciation of writer's call but declined needing any further follow up from [facility].   Marthenia Rolling, MSN-Ed, RN,BSN [place] Acute Care Coordinator [phone] [facility]) [phone]  (Toll free office)

## 2020-02-20 ENCOUNTER — Telehealth (HOSPITAL_COMMUNITY): Payer: Self-pay | Admitting: *Deleted

## 2020-02-20 DIAGNOSIS — Z7689 Persons encountering health services in other specified circumstances: Secondary | ICD-10-CM | POA: Diagnosis not present

## 2020-02-20 DIAGNOSIS — N183 Chronic kidney disease, stage 3 unspecified: Secondary | ICD-10-CM | POA: Diagnosis not present

## 2020-02-20 DIAGNOSIS — I5022 Chronic systolic (congestive) heart failure: Secondary | ICD-10-CM | POA: Diagnosis not present

## 2020-02-20 DIAGNOSIS — E1122 Type 2 diabetes mellitus with diabetic chronic kidney disease: Secondary | ICD-10-CM | POA: Diagnosis not present

## 2020-02-20 NOTE — Telephone Encounter (Signed)
Dr.Jennifer Burgart with [facility] left VM requesting a call back from a provider regarding worsening labs. Physician requests a call from a provider not clinical staff.   Call back # [phone] ask for Dr.Jennifer Burgart

## 2020-02-21 ENCOUNTER — Other Ambulatory Visit: Payer: Self-pay

## 2020-02-21 ENCOUNTER — Ambulatory Visit (INDEPENDENT_AMBULATORY_CARE_PROVIDER_SITE_OTHER): Payer: Medicare Other | Admitting: *Deleted

## 2020-02-21 DIAGNOSIS — I5022 Chronic systolic (congestive) heart failure: Secondary | ICD-10-CM | POA: Diagnosis not present

## 2020-02-21 LAB — CUP PACEART REMOTE DEVICE CHECK
Battery Remaining Longevity: 15 mo
Battery Voltage: 2.91 V
Brady Statistic AP VP Percent: 8.72 %
Brady Statistic AP VS Percent: 0.09 %
Brady Statistic AS VP Percent: 86.6 %
Brady Statistic AS VS Percent: 4.59 %
Brady Statistic RA Percent Paced: 7.46 %
Brady Statistic RV Percent Paced: 93.74 %
Date Time Interrogation Session: 20210330033325
HighPow Impedance: 97 Ohm
Implantable Lead Implant Date: 20170713
Implantable Lead Implant Date: 20170713
Implantable Lead Implant Date: 20170713
Implantable Lead Location: 753858
Implantable Lead Location: 753859
Implantable Lead Location: 753860
Implantable Lead Model: 4598
Implantable Lead Model: 5076
Implantable Pulse Generator Implant Date: 20170713
Lead Channel Impedance Value: 1007 Ohm
Lead Channel Impedance Value: 1007 Ohm
Lead Channel Impedance Value: 475 Ohm
Lead Channel Impedance Value: 475 Ohm
Lead Channel Impedance Value: 475 Ohm
Lead Channel Impedance Value: 475 Ohm
Lead Channel Impedance Value: 513 Ohm
Lead Channel Impedance Value: 551 Ohm
Lead Channel Impedance Value: 608 Ohm
Lead Channel Impedance Value: 646 Ohm
Lead Channel Impedance Value: 817 Ohm
Lead Channel Impedance Value: 836 Ohm
Lead Channel Impedance Value: 988 Ohm
Lead Channel Pacing Threshold Amplitude: 0.5 V
Lead Channel Pacing Threshold Amplitude: 0.875 V
Lead Channel Pacing Threshold Amplitude: 3.75 V
Lead Channel Pacing Threshold Pulse Width: 0.4 ms
Lead Channel Pacing Threshold Pulse Width: 0.4 ms
Lead Channel Pacing Threshold Pulse Width: 0.8 ms
Lead Channel Sensing Intrinsic Amplitude: 2.375 mV
Lead Channel Sensing Intrinsic Amplitude: 2.375 mV
Lead Channel Sensing Intrinsic Amplitude: 6.875 mV
Lead Channel Sensing Intrinsic Amplitude: 6.875 mV
Lead Channel Setting Pacing Amplitude: 2 V
Lead Channel Setting Pacing Amplitude: 2.5 V
Lead Channel Setting Pacing Amplitude: 4.75 V
Lead Channel Setting Pacing Pulse Width: 0.4 ms
Lead Channel Setting Pacing Pulse Width: 0.8 ms
Lead Channel Setting Sensing Sensitivity: 0.3 mV

## 2020-02-21 NOTE — Progress Notes (Signed)
ICD remote 

## 2020-02-22 ENCOUNTER — Ambulatory Visit: Payer: Medicare Other

## 2020-02-22 DIAGNOSIS — Z95810 Presence of automatic (implantable) cardiac defibrillator: Secondary | ICD-10-CM

## 2020-02-22 DIAGNOSIS — I5022 Chronic systolic (congestive) heart failure: Secondary | ICD-10-CM

## 2020-02-27 ENCOUNTER — Other Ambulatory Visit (HOSPITAL_COMMUNITY): Payer: Self-pay | Admitting: Cardiology

## 2020-02-27 ENCOUNTER — Other Ambulatory Visit: Payer: Self-pay

## 2020-02-27 ENCOUNTER — Ambulatory Visit (HOSPITAL_COMMUNITY)
Admission: RE | Admit: 2020-02-27 | Discharge: 2020-02-27 | Disposition: A | Discharge status: Home / Self Care | Payer: Medicare Other | Source: Ambulatory Visit | Attending: Cardiology | Admitting: Cardiology

## 2020-02-27 DIAGNOSIS — I428 Other cardiomyopathies: Secondary | ICD-10-CM

## 2020-02-27 DIAGNOSIS — M542 Cervicalgia: Secondary | ICD-10-CM | POA: Diagnosis not present

## 2020-02-27 DIAGNOSIS — I5022 Chronic systolic (congestive) heart failure: Secondary | ICD-10-CM

## 2020-02-27 DIAGNOSIS — R2689 Other abnormalities of gait and mobility: Secondary | ICD-10-CM | POA: Diagnosis not present

## 2020-02-27 DIAGNOSIS — M6281 Muscle weakness (generalized): Secondary | ICD-10-CM | POA: Diagnosis not present

## 2020-02-27 DIAGNOSIS — G4733 Obstructive sleep apnea (adult) (pediatric): Secondary | ICD-10-CM

## 2020-02-27 NOTE — Progress Notes (Signed)
Son left voice mail message stating his shipping/mailing address is   [place] [address].    He stated the monitor can be shipped to that address.

## 2020-02-27 NOTE — Progress Notes (Signed)
EPIC Encounter for ICM Monitoring  Patient Name: Justin Hill is a 79 y.o. male Date: 02/27/2020 Primary Care Physican: Serita Grammes, MD Primary Cardiologist:[place] Electrophysiologist: Allred Bi-V Pacing: 95.1% 02/14/2020 Office Weight:273lbs  Clinical Status (14-Feb-2020 to 21-Feb-2020)  AT/AF                           147 episodes   Time in AT/AF              5.5 hr/day (22.8%)  Longest AT/AF             4 hours    Spoke with son, Justin Hill per DPR. He reported his mother passed away unexpectedly and patient is now living in [place] with him. He forgot to bring the device monitor from patients home.  He is in the process of getting new heart physicians for patient in the area that he lives.    Optivol thoracic impedancetrending above baseline 3/30  Prescribed:  Torsemide20mg take2tablets (40 mg total) by mouth twice a day.  Metolazone 5 mg take as directed.  Labs: 02/01/2020 Creatinine 2.06, BUN 29, Potassium 4.0, Sodium 140, GFR 30-35 01/31/2020 Creatinine 2.26, BUN 32, Potassium 3.6, Sodium 140, GFR 27-31  01/30/2020 Creatinine 2.83, BUN 36, Potassium 3.7, Sodium 141, GFR 21-24  01/29/2020 Creatinine 2.57, BUN 32, Potassium 3.6, Sodium 145, GFR 23-27  01/28/2020 Creatinine 2.33, BUN 27, Potassium 3.7, Sodium 144, GFR 26-30  01/27/2020 Creatinine 2.16, BUN 26, Potassium 4.3, Sodium 142, GFR 29-33  01/26/2020 Creatinine 2.70, BUN 22, Potassium 3.8, Sodium 143, GFR 22-25 01/25/2020 Creatinine 2.32, BUN 22, Potassium 4.1, Sodium 144, GFR 26-30  01/24/2020 Creatinine 2.46, BUN 26, Potassium 3.9, Sodium 141, GFR 25-28  01/17/2020 Creatinine 2.18, BUN 17, Potassium 4.2, Sodium 143, GFR 28-33  A complete set of results can be found in Results Review.  Recommendations:Advised to call if patient has any fluid symptoms.     Follow-up plan: ICM clinic phone appointment on5/01/2020. 91 day device clinic remote  transmission6/29/2021.Telehealthappt with HF clinic PA/NP on4/03/2020.  Copy of ICM check sent to Dr.Allred.  3 month ICM trend: 02/21/2020    1 Year ICM trend:       Rosalene Billings, RN 02/27/2020 11:50 AM

## 2020-02-27 NOTE — Progress Notes (Signed)
Patient ID: Justin Hill, male   DOB: 1941/04/06, 79 y.o.   MRN: [id number] PCP: Dr. Camillia Herter Cardiology: Dr. Aundra Dubin  Virtual Visit via Video Note  I connected with Justin Hill on @TODAY @ at  3:30 PM EDT by a video enabled telemedicine application and verified that I am speaking with the correct person using two identifiers.   I discussed the limitations of evaluation and management by telemedicine and the availability of in person appointments. The patient expressed understanding and agreed to proceed.   Justin Grinder, NP   79 y.o. with history CKD, HTN, diabetes, chronic LBBB, and nonischemic cardiomyopathy presents for followup of CHF.  His cardiomyopathy has been known for years.  He had a LHC in 2008 showing mild nonobstructive CAD.     Justin Hill was admitted in 2/16 with atrial flutter degenerating into atrial fibrillation.  He was initially cardioverted, then atrial flutter recurred.  He had atrial flutter ablation by Dr Rayann Heman in 2/16.  He thinks that he has been in NSR since that time, no further palpitations. He is on Xarelto now with no melena or BRBPR.    He is s/p placement of Medtronic CRT-D device.  Echo in 1/18 showed improvement in EF to 40-45%.  Echo in 5/20 showed EF up to 55-60%.   He developed normal pressure hydrocephalus and had VP shunt placed.  This has helped with his walking and some with his memory.   Admitted March 2409 with A/C Systolic HF. Given IV Lasix here, weight down but developed AKI on CKD with creatinine up to 2.8.  [name] 01/26/20 showed mildly elevated PCWP and normal RA pressure.  Creatinine went back down to 2.06 after holding Lasix. He was restarted on torsemide 40 mg twice a day. Delene Loll was not restarted with normalized EF and AKI.  Today he returns for HF follow up. Completed a few doses of metolazone on 02/14/20. Overall feeling better. Mild SOB with exertion. Denies PND/Orthopnea. Appetite ok. No fever or chills. Using CPAP every night.  Taking all medications. Plans to move to [place] with his son.   Medtronic device interrogation: From 02/22/20. No VT. Activity. Heart Variability noted. Impedance trending up.   Labs (5/14): K 4, creatinine 1.4, LFTs normal, LDL 46, HDL 37 Labs (1/15): K 4.4, creatinine 1.46, LDL 74, HDL 35 Labs (4/15); LDL 96, HDL 44, K 5.2, creatinine 1.4 Labs (1/16): K 4.9, creatinine 1.36 Labs (2/16): K 4.5, creatinine 1.56, BNP 412 Labs (9/16): K 4.8, creatinine 1.33, hgb 14.7 Labs (11/16): K 4.3, creatinine 1.37, BNP 52 Labs (1/17): K 4.5 => 5, creatinine 1.35 => 1.17, HCT 47.4 Labs (2/17): K 4.9, creatinine 1.35 Labs (5/17): LDL 48, LFTs normal Labs (9/17): K 5, creatinine 1.21 Labs (11/17): HCT 41.4, digoxin 0.3, K 4.9, creatinine 1.27 Labs (5/18): K 5.2, creatinine 1.47 Labs (7/18): digoxin 0.3 Labs (9/18): LDL 82, HDL 49, K 4.5, creatinine 1.34 Labs (3/19): digoxin 0.4, K 4.5, creatinine 1.42 Labs (6/19): K 5.2, LDL 76 Labs (10/19): K 5, creatinine 1.57 Labs (11/19): creatinine 1.9 Labs (12/19): K 4.5, creatinine 1.66 Labs (8/20): K 4.8, creatinine 1.74, hgb 11.7 Labs (11/20): LDL 71 Labs (1/21): K 4.3, creatinine 1.95 Labs (02/01/20): K 4 creatinine 2.06   PMH: 1. Type II diabetes with peripheral neuropathy and nephropathy.  2. CKD stage 3 3. Hyperlipidemia 4. HTN: ACEI cough.  5. Nonischemic cardiomyopathy: This was diagnosed prior to 2008.  In 2008, patient had LHC with only mild nonobstructive coronary disease.  Echo in 1/14 showed EF 25-30% with normal RV.  Patient has not tolerated ACEI due to hyperkalemia and AKI. ICD was discussed (Dr Lovena Le) and decided against given NYHA class I symptoms.  Echo (7/15) with EF 25-30%, septal-lateral dyssynchrony, mild LV dilation. Echo (6/16) with EF 25-30%, mild LVH, septal-lateral dyssynchrony, normal RV size and systolic function. CPX (12/16) with peak VO2 14.5 (69% predicted), VE/VCO2 34, RER 1.19 => mild functional impairment from heart  failure, restrictive lung function likely due to body habitus.  - LHC/RHC (6/17): 30% mid LAD stenosis; mean RA 5, PA 33/10, PCWP mean 16, CI 1.95.  - RHC 01/3020: mildly elevated PCWP and normal RA pressure. - Medtronic CRT-D placed 7/17.  - Echo (1/18): EF 40-45%, mild LVH.  - Echo (3/19): EF 40-45%, moderate LVH, normal RV size/function.  - Echo (5/20): EF 55-60%, normal RV size and systolic function.  6. Chronic LBBB 7. Obesity 8. Atrial flutter and atrial fibrillation: S/p atrial flutter ablation in 2/16 (Allred).  9. Normal pressure hydrocephalus: s/p VP shunt.  10. OSA: Uses CPAP.   SH: Lives in [place], married, prior smoker, no ETOH.  1 child.   FH: Grandmother with CHF.  Mother with CVA.   ROS: All systems reviewed and negative except as per HPI.    Current Outpatient Medications  Medication Sig Dispense Refill  . acetaminophen (TYLENOL) 500 MG tablet Take 500-1,000 mg by mouth every 6 (six) hours as needed for mild pain or headache.     . Ascorbic Acid (VITAMIN C) 1000 MG tablet Take 1 tablet by mouth daily.     . carvedilol (COREG) 25 MG tablet TAKE 1 TABLET BY MOUTH TWICE DAILY WITH MEALS (Patient taking differently: Take 25 mg by mouth 2 (two) times daily with a meal. ) 180 tablet 0  . cholecalciferol (VITAMIN D3) 25 MCG (1000 UNIT) tablet Take 1,000 Units by mouth daily.    . Cinnamon 500 MG TABS Take 1 tablet (500 mg total) by mouth daily.    . fenofibrate 54 MG tablet Take 1 tablet by mouth once daily 90 tablet 0  . Fluticasone-Umeclidin-Vilant (TRELEGY ELLIPTA) 100-62.5-25 MCG/INH AEPB Inhale 1 puff into the lungs daily.    . insulin NPH-regular Human (70-30) 100 UNIT/ML injection 150 units with breakfast, and 30 units with supper, and syringes 2/day (Patient taking differently: Inject 50-150 Units into the skin See admin instructions. Inject 150 units in the morning and 50 units at bedtime.) 100 mL 11  . loratadine (CLARITIN) 10 MG tablet Take 20 mg by mouth daily.       . metolazone (ZAROXOLYN) 5 MG tablet Take 5 mg by mouth as directed.    . Multiple Vitamin (MULTIVITAMIN) tablet Take 1 tablet by mouth daily.      Marland Kitchen omeprazole (PRILOSEC) 40 MG capsule Take 40 mg by mouth daily. OTC    . Rivaroxaban (XARELTO) 15 MG TABS tablet Take 1 tablet (15 mg total) by mouth daily with supper. 30 tablet 6  . simvastatin (ZOCOR) 40 MG tablet Take 1 tablet (40 mg total) by mouth at bedtime. 90 tablet 3  . tadalafil (CIALIS) 5 MG tablet Take 5 mg by mouth every evening.   3  . tamsulosin (FLOMAX) 0.4 MG CAPS capsule Take 0.4 mg by mouth daily.    Marland Kitchen torsemide (DEMADEX) 20 MG tablet Take 2 tablets (40 mg total) by mouth 2 (two) times daily. 120 tablet 0   No current facility-administered medications for this encounter.  There were no vitals taken for this visit.  Wt Readings from Last 3 Encounters:  02/14/20 123.8 kg (273 lb)  02/01/20 123.3 kg (271 lb 14.4 oz)  01/18/20 126.7 kg (279 lb 6.4 oz)    General: Exam is subjective.  resp difficulty Neck: supple. no JVD. Carotids 2+ bilat; no bruits. No lymphadenopathy or thryomegaly appreciated. Lungs: Does not sound short of breath.   Abdomen: Denies tenderness Extremities: Reports no edema.  Neuro: alert & orientedx3.   Assessment/Plan: 1. Cardiomyopathy: Nonischemic.  Low EF x years. Echo 6/16 showed stable EF 25-30% with septal-lateral dyssynchrony.  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  Interestingly, it also appears that he has a long-standing LBBB.  A chronic LBBB itself can potentially cause a cardiomyopathy and may be the source of his low EF.   Coronary angiography in 6/17 with nonobstructive disease. S/P Medtronic CRT-D device placement.  Most recent echo in 5/20 showed EF 55% RV normal.   - NYHA III. Volume status stable. Continue torsemide 40 mg twice a day.  - Continue Coreg 25 mg bid.  - Off entresto with recent AKI.   2. Hyperlipidemia:  Patient is on simvastatin.  Good lipids in 11/20.  3. CKD: Stage 3.  Followed by nephrology.  4. Atrial flutter: s/p ablation  5. PAF:  -Continue carvedilol 25 mg twicea day.  - Continue Xarelto 15 mg daily.  6. Normal pressure hydrocephalus: Has VP shunt.   7. OSA: Continue to use CPAP.  Continue nightly use.  8. Obesity Discussed portion control.   He was referred to [facility].    I discussed the assessment and treatment plan with the patient. The patient was provided an opportunity to ask questions and all were answered. The patient agreed with the plan and demonstrated an understanding of the instructions.   The patient was advised to call back or seek an in-person evaluation if the symptoms worsen or if the condition fails to improve as anticipated.  I provided 10 minutes of non-face-to-face time during this encounter He was referred to [place] NP-C  02/27/2020

## 2020-02-27 NOTE — Progress Notes (Signed)
Attempted call back to son.  Left message to call back and provide his current address and will have a new Carelink monitor sent to him instead of trying to have the old monitor sent from [facility].

## 2020-02-29 ENCOUNTER — Ambulatory Visit (INDEPENDENT_AMBULATORY_CARE_PROVIDER_SITE_OTHER): Payer: Medicare Other | Admitting: Endocrinology

## 2020-02-29 ENCOUNTER — Other Ambulatory Visit: Payer: Self-pay

## 2020-02-29 DIAGNOSIS — Z794 Long term (current) use of insulin: Secondary | ICD-10-CM | POA: Diagnosis not present

## 2020-02-29 DIAGNOSIS — E1122 Type 2 diabetes mellitus with diabetic chronic kidney disease: Secondary | ICD-10-CM

## 2020-02-29 DIAGNOSIS — R61 Generalized hyperhidrosis: Secondary | ICD-10-CM | POA: Diagnosis not present

## 2020-02-29 DIAGNOSIS — N189 Chronic kidney disease, unspecified: Secondary | ICD-10-CM

## 2020-02-29 MED ORDER — INSULIN NPH ISOPHANE & REGULAR (70-30) 100 UNIT/ML ~~LOC~~ SUSP: SUBCUTANEOUS | 11 refills | Status: AC

## 2020-02-29 NOTE — Progress Notes (Signed)
Call to [facility] and new monitor ordered.  Patient should receive in 7-10 days at his son's address in [place].

## 2020-02-29 NOTE — Patient Instructions (Addendum)
Please reduce the insulin to 140 units with breakfast, and 30 units with supper.     On this type of insulin schedule, you should eat meals on a regular schedule.  If a meal is missed or significantly.  delayed, your blood sugar could go low.   check your blood sugar twice a day.  vary the time of day when you check, between before the 3 meals, and at bedtime.  also check if you have symptoms of your blood sugar being too high or too low.  please keep a record of the readings and bring it to your next appointment here.  please call us sooner if your blood sugar goes below 70, or if you have a lot of readings over 200.   If you have any blood sugar under 80, please write on the paper why you think that was.   Best wishes with your new doctors in [place].   I would be happy to see you back here as needed.

## 2020-02-29 NOTE — Progress Notes (Signed)
Call to son and advised a new Carelink monitor will be shipped and he should receive in 7-10 days.  Advised support services number will be on the monitor and he can call for assistance with set up if needed.

## 2020-02-29 NOTE — Progress Notes (Signed)
Subjective:    Patient ID: Justin Hill, male    DOB: [birth year], 79 y.o.   MRN: [id number]  HPI telehealth visit today via doxy video visit.  Alternatives to telehealth are presented to this patient, and the patient agrees to the telehealth visit.  Pt is advised of the cost of the visit, and agrees to this, also.   Patient is at his son's house [place], [place]), and I am at the office.   Persons attending the telehealth visit: the patient, son, and I.   Pt returns for f/u of DM: DM type: Insulin-requiring type 2.  Dx'ed: 4128 Complications: CHF, toe ulcer, partial toe amputation, polyneuropathy, and renal insufficiency.   Therapy: insulin since 2012.  DKA: never.   Severe hypoglycemia: once, in 2021.   Pancreatitis: never.     SDOH: wife gives insulin, due to his memory loss.   Other: he takes human insulin, due to cost; in October of 2014, he was changed from multiple daily injections to BID, (due to persistently high A1c), with better results.   Interval history:  Son is unaware of how cbg's are recently, due to pt's wife's sudden death.  However, pt occasionally has diaphoresis in the afternoon.  This resolves with PO glucose.  It is in general higher as the day goes on.  He takes 150 units with breakfast, and 60 units with supper.    Past Medical History:  Diagnosis Date  . Altered mental status   . Cardiomyopathy   . CHF (congestive heart failure) ([place])   . Chronic kidney disease (CKD), stage III (moderate)   . Communicating hydrocephalus ([place])   . Diabetes mellitus    type 2  . Diverticulosis   . History of kidney stones   . Hypercholesterolemia   . Hyperglycemia   . Hypertension   . Paroxysmal atrial fibrillation ([facility])   . Personal history of kidney stones   . Pneumonia   . Sleep apnea    uses cpap    Past Surgical History:  Procedure Laterality Date  . ATRIAL FLUTTER ABLATION N/A 01/19/2015   Procedure: ATRIAL FLUTTER ABLATION;  Surgeon: [name], MD;   Location[facility] CATH LAB;  Service: Cardiovascular;  Laterality: N/A;  . CARDIAC CATHETERIZATION N/A 05/02/2016   Procedure: Right/Left Heart Cath and Coronary Angiography;  Surgeon: Larey Dresser, MD;  Location: [place] CV LAB;  Service: Cardiovascular;  Laterality: N/A;  . COLONOSCOPY N/A 12/29/2013   Procedure: COLONOSCOPY;  Surgeon: Lafayette Dragon, MD;  Location: [facility] ENDOSCOPY;  Service: Endoscopy;  Laterality: N/A;  . EP IMPLANTABLE DEVICE N/A 06/05/2016   MDT Hillery Aldo XT CRTD implanted by Dr Rayann Heman   . EYE SURGERY     cataract surgery (not sure which eye)  . RIGHT HEART CATH N/A 01/26/2020   Procedure: RIGHT HEART CATH;  Surgeon: Larey Dresser, MD;  Location: [place] CV LAB;  Service: Cardiovascular;  Laterality: N/A;  . [place]   left. Muscle reattachment  . TOE SURGERY Right 12/2012   2nd toe  . TONSILLECTOMY    . VENTRICULOPERITONEAL SHUNT Right 11/20/2017   Procedure: SHUNT PLACEMENT RIGHT;  Surgeon: Newman Pies, MD;  Location: [place];  Service: Neurosurgery;  Laterality: Right;  SHUNT PLACEMENT RIGHT    Social History   Socioeconomic History  . Marital status: Married    Spouse name: Rosemarie Beath  . Number of children: 1  . Years of education: 53  . Highest education level: Not  on file  Occupational History  . Occupation: retired  Tobacco Use  . Smoking status: Former Smoker    Types: Cigarettes  . Smokeless tobacco: Never Used  Substance and Sexual Activity  . Alcohol use: No  . Drug use: No  . Sexual activity: Not on file  Other Topics Concern  . Not on file  Social History Narrative   Regular exercise-no   Rare caffeine use    Social Determinants of Health   Financial Resource Strain:   . Difficulty of Paying Living Expenses:   Food Insecurity:   . Worried About Charity fundraiser in the Last Year:   . Arboriculturist in the Last Year:   Transportation Needs:   . Film/video editor (Medical):   Marland Kitchen Lack of Transportation  (Non-Medical):   Physical Activity:   . Days of Exercise per Week:   . Minutes of Exercise per Session:   Stress:   . Feeling of Stress :   Social Connections:   . Frequency of Communication with Friends and Family:   . Frequency of Social Gatherings with Friends and Family:   . Attends Religious Services:   . Active Member of Clubs or Organizations:   . Attends Archivist Meetings:   Marland Kitchen Marital Status:   Intimate Partner Violence:   . Fear of Current or Ex-Partner:   . Emotionally Abused:   Marland Kitchen Physically Abused:   . Sexually Abused:     Current Outpatient Medications on File Prior to Visit  Medication Sig Dispense Refill  . acetaminophen (TYLENOL) 500 MG tablet Take 500-1,000 mg by mouth every 6 (six) hours as needed for mild pain or headache.     . Ascorbic Acid (VITAMIN C) 1000 MG tablet Take 1 tablet by mouth daily.     . carvedilol (COREG) 25 MG tablet TAKE 1 TABLET BY MOUTH TWICE DAILY WITH MEALS (Patient taking differently: Take 25 mg by mouth 2 (two) times daily with a meal. ) 180 tablet 0  . cholecalciferol (VITAMIN D3) 25 MCG (1000 UNIT) tablet Take 1,000 Units by mouth daily.    . Cinnamon 500 MG TABS Take 1 tablet (500 mg total) by mouth daily.    . fenofibrate 54 MG tablet Take 1 tablet by mouth once daily 90 tablet 0  . Fluticasone-Umeclidin-Vilant (TRELEGY ELLIPTA) 100-62.5-25 MCG/INH AEPB Inhale 1 puff into the lungs daily.    Marland Kitchen loratadine (CLARITIN) 10 MG tablet Take 20 mg by mouth daily.     . metolazone (ZAROXOLYN) 5 MG tablet Take 5 mg by mouth as directed.    . Multiple Vitamin (MULTIVITAMIN) tablet Take 1 tablet by mouth daily.      Marland Kitchen omeprazole (PRILOSEC) 40 MG capsule Take 40 mg by mouth daily. OTC    . Rivaroxaban (XARELTO) 15 MG TABS tablet Take 1 tablet (15 mg total) by mouth daily with supper. 30 tablet 6  . simvastatin (ZOCOR) 40 MG tablet Take 1 tablet (40 mg total) by mouth at bedtime. 90 tablet 3  . tadalafil (CIALIS) 5 MG tablet Take 5 mg by  mouth every evening.   3  . tamsulosin (FLOMAX) 0.4 MG CAPS capsule Take 0.4 mg by mouth daily.    Marland Kitchen torsemide (DEMADEX) 20 MG tablet Take 2 tablets (40 mg total) by mouth 2 (two) times daily. 120 tablet 0   No current facility-administered medications on file prior to visit.    Allergies  Allergen Reactions  . Penicillins Anaphylaxis and  Other (See Comments)    SYNCOPE PATIENT HAS HAD A PCN REACTION WITH IMMEDIATE RASH, FACIAL/TONGUE/THROAT SWELLING, SOB, OR LIGHTHEADEDNESS WITH HYPOTENSION:  #  #  #  YES  #  #  #   Has patient had a PCN reaction causing severe rash involving mucus membranes or skin necrosis: no PATIENT HAS HAD A PCN REACTION THAT REQUIRED HOSPITALIZATION:  #  #  #  YES  #  #  #  Has patient had a PCN reaction occurring within the last 10 years: no    . Aspirin Other (See Comments)    Burps, burning, stomach pains, etc  . Lisinopril Other (See Comments) and Cough    Severe coughing    Family History  Problem Relation Age of Onset  . Heart failure Maternal Grandmother   . Diabetes type II Sister 1  . Cancer Sister   . Hyperlipidemia Other        Parent  . Stroke Other        Parent  . Hypertension Other        Parent  . Diabetes Other        Parent  . Colon cancer Neg Hx   . Heart attack Neg Hx     There were no vitals taken for this visit.   Review of Systems Denies LOC    Objective:   Physical Exam      Assessment & Plan:  Insulin-requiring type 2 DM, with CRI: uncertain glycemic control Diaphoresis, poss due to hypoglycemia SDOH: this limits glycemic control  Patient Instructions  Please reduce the insulin to 140 units with breakfast, and 30 units with supper.     On this type of insulin schedule, you should eat meals on a regular schedule.  If a meal is missed or significantly.  delayed, your blood sugar could go low.   check your blood sugar twice a day.  vary the time of day when you check, between before the 3 meals, and at bedtime.   also check if you have symptoms of your blood sugar being too high or too low.  please keep a record of the readings and bring it to your next appointment here.  please call us sooner if your blood sugar goes below 70, or if you have a lot of readings over 200.   If you have any blood sugar under 80, please write on the paper why you think that was.   Best wishes with your new doctors in [place].   I would be happy to see you back here as needed.

## 2020-03-01 DIAGNOSIS — M542 Cervicalgia: Secondary | ICD-10-CM | POA: Diagnosis not present

## 2020-03-01 DIAGNOSIS — Z7689 Persons encountering health services in other specified circumstances: Secondary | ICD-10-CM | POA: Diagnosis not present

## 2020-03-01 DIAGNOSIS — M6240 Contracture of muscle, unspecified site: Secondary | ICD-10-CM | POA: Diagnosis not present

## 2020-03-01 DIAGNOSIS — Z79899 Other long term (current) drug therapy: Secondary | ICD-10-CM | POA: Diagnosis not present

## 2020-03-05 DIAGNOSIS — M6281 Muscle weakness (generalized): Secondary | ICD-10-CM | POA: Diagnosis not present

## 2020-03-05 DIAGNOSIS — M542 Cervicalgia: Secondary | ICD-10-CM | POA: Diagnosis not present

## 2020-03-05 DIAGNOSIS — R2689 Other abnormalities of gait and mobility: Secondary | ICD-10-CM | POA: Diagnosis not present

## 2020-03-07 DIAGNOSIS — R2689 Other abnormalities of gait and mobility: Secondary | ICD-10-CM | POA: Diagnosis not present

## 2020-03-07 DIAGNOSIS — M6281 Muscle weakness (generalized): Secondary | ICD-10-CM | POA: Diagnosis not present

## 2020-03-07 DIAGNOSIS — M542 Cervicalgia: Secondary | ICD-10-CM | POA: Diagnosis not present

## 2020-03-12 DIAGNOSIS — M6281 Muscle weakness (generalized): Secondary | ICD-10-CM | POA: Diagnosis not present

## 2020-03-12 DIAGNOSIS — M542 Cervicalgia: Secondary | ICD-10-CM | POA: Diagnosis not present

## 2020-03-12 DIAGNOSIS — R2689 Other abnormalities of gait and mobility: Secondary | ICD-10-CM | POA: Diagnosis not present

## 2020-03-19 DIAGNOSIS — M6281 Muscle weakness (generalized): Secondary | ICD-10-CM | POA: Diagnosis not present

## 2020-03-19 DIAGNOSIS — R2689 Other abnormalities of gait and mobility: Secondary | ICD-10-CM | POA: Diagnosis not present

## 2020-03-19 DIAGNOSIS — M542 Cervicalgia: Secondary | ICD-10-CM | POA: Diagnosis not present

## 2020-03-21 DIAGNOSIS — M542 Cervicalgia: Secondary | ICD-10-CM | POA: Diagnosis not present

## 2020-03-21 DIAGNOSIS — R2689 Other abnormalities of gait and mobility: Secondary | ICD-10-CM | POA: Diagnosis not present

## 2020-03-21 DIAGNOSIS — M6281 Muscle weakness (generalized): Secondary | ICD-10-CM | POA: Diagnosis not present

## 2020-03-26 ENCOUNTER — Ambulatory Visit (INDEPENDENT_AMBULATORY_CARE_PROVIDER_SITE_OTHER): Payer: Medicare Other

## 2020-03-26 DIAGNOSIS — Z95810 Presence of automatic (implantable) cardiac defibrillator: Secondary | ICD-10-CM

## 2020-03-26 DIAGNOSIS — I5022 Chronic systolic (congestive) heart failure: Secondary | ICD-10-CM | POA: Diagnosis not present

## 2020-03-27 DIAGNOSIS — R2689 Other abnormalities of gait and mobility: Secondary | ICD-10-CM | POA: Diagnosis not present

## 2020-03-27 DIAGNOSIS — M542 Cervicalgia: Secondary | ICD-10-CM | POA: Diagnosis not present

## 2020-03-27 DIAGNOSIS — M6281 Muscle weakness (generalized): Secondary | ICD-10-CM | POA: Diagnosis not present

## 2020-03-27 NOTE — Progress Notes (Signed)
EPIC Encounter for ICM Monitoring  Patient Name: Justin Hill is a 79 y.o. male Date: 03/27/2020 Primary Care Physican: Serita Grammes, MD Primary Cardiologist:[place] Electrophysiologist: Allred Bi-V Pacing: 98.9% 02/14/2020 Office Weight:273lbs  Clinical Status Since 08-Mar-2020  AT/AF 57  Time in AT/AF 0.3 hr/day (1.2%) (taking Xarelto)  Longest AT/AF 105 minutes   Attempted call to son, Justin Hill per DPR and unable to leave a message.   Optivol thoracic impedancenormal but was suggesting possible fluid accumulation 4/16-4/25.  Prescribed:  Torsemide20mg take2tablets (40 mg total) by mouth twice a day.  Metolazone 5 mg take as directed.  Labs: 02/20/2020 Creatinine 3.5,   BUN 16, Potassium 3.3, Sodium 133, GFR 17-21 02/01/2020 Creatinine 2.06, BUN 29, Potassium 4.0, Sodium 140, GFR 30-35 01/31/2020 Creatinine 2.26, BUN 32, Potassium 3.6, Sodium 140, GFR 27-31  01/30/2020 Creatinine 2.83, BUN 36, Potassium 3.7, Sodium 141, GFR 21-24  01/29/2020 Creatinine 2.57, BUN 32, Potassium 3.6, Sodium 145, GFR 23-27  01/28/2020 Creatinine 2.33, BUN 27, Potassium 3.7, Sodium 144, GFR 26-30  01/27/2020 Creatinine 2.16, BUN 26, Potassium 4.3, Sodium 142, GFR 29-33  01/26/2020 Creatinine 2.70, BUN 22, Potassium 3.8, Sodium 143, GFR 22-25 01/25/2020 Creatinine 2.32, BUN 22, Potassium 4.1, Sodium 144, GFR 26-30  01/24/2020 Creatinine 2.46, BUN 26, Potassium 3.9, Sodium 141, GFR 25-28  01/17/2020 Creatinine 2.18, BUN 17, Potassium 4.2, Sodium 143, GFR 28-33  A complete set of results can be found in Results Review.  Recommendations: Unable to reach.    Follow-up plan: ICM clinic phone appointment on6/05/2020. 91 day device clinic remote transmission6/29/2021.   Copy of ICM check sent to Dr.Allred.  3 month ICM trend: 03/26/2020    1 Year ICM trend:       Justin Billings, RN 03/27/2020 3:54 PM

## 2020-03-30 ENCOUNTER — Telehealth (HOSPITAL_COMMUNITY): Payer: Self-pay

## 2020-03-30 NOTE — Telephone Encounter (Signed)
Received vm from patients son stating that they have not heard from [facility] clinic.  They were referred a month ago when [facility] virtual visit with NP. LM with [facility] clinic asking for return call about referral.

## 2020-04-03 DIAGNOSIS — M6281 Muscle weakness (generalized): Secondary | ICD-10-CM | POA: Diagnosis not present

## 2020-04-03 DIAGNOSIS — M542 Cervicalgia: Secondary | ICD-10-CM | POA: Diagnosis not present

## 2020-04-03 DIAGNOSIS — R2689 Other abnormalities of gait and mobility: Secondary | ICD-10-CM | POA: Diagnosis not present

## 2020-04-04 NOTE — Telephone Encounter (Signed)
[facility] staff called to say they did not receive any information for Justin Hill.   They provided me with fax number and asked that I fax patients records to their office at [phone] Justin Hill. Pt records faxed with demographics. Advised son to be on look out for call from [facility] office. Confirmation received

## 2020-04-10 DIAGNOSIS — E1165 Type 2 diabetes mellitus with hyperglycemia: Secondary | ICD-10-CM | POA: Diagnosis not present

## 2020-04-10 DIAGNOSIS — I10 Essential (primary) hypertension: Secondary | ICD-10-CM | POA: Diagnosis not present

## 2020-04-10 DIAGNOSIS — Z794 Long term (current) use of insulin: Secondary | ICD-10-CM | POA: Diagnosis not present

## 2020-04-10 DIAGNOSIS — E785 Hyperlipidemia, unspecified: Secondary | ICD-10-CM | POA: Diagnosis not present

## 2020-04-11 DIAGNOSIS — R2689 Other abnormalities of gait and mobility: Secondary | ICD-10-CM | POA: Diagnosis not present

## 2020-04-11 DIAGNOSIS — M542 Cervicalgia: Secondary | ICD-10-CM | POA: Diagnosis not present

## 2020-04-11 DIAGNOSIS — M6281 Muscle weakness (generalized): Secondary | ICD-10-CM | POA: Diagnosis not present

## 2020-04-24 DIAGNOSIS — E1122 Type 2 diabetes mellitus with diabetic chronic kidney disease: Secondary | ICD-10-CM | POA: Diagnosis not present

## 2020-04-24 DIAGNOSIS — E11621 Type 2 diabetes mellitus with foot ulcer: Secondary | ICD-10-CM | POA: Diagnosis not present

## 2020-04-24 DIAGNOSIS — Z794 Long term (current) use of insulin: Secondary | ICD-10-CM | POA: Diagnosis not present

## 2020-04-24 DIAGNOSIS — N184 Chronic kidney disease, stage 4 (severe): Secondary | ICD-10-CM | POA: Diagnosis not present

## 2020-04-26 DIAGNOSIS — G4733 Obstructive sleep apnea (adult) (pediatric): Secondary | ICD-10-CM | POA: Diagnosis not present

## 2020-04-26 DIAGNOSIS — G919 Hydrocephalus, unspecified: Secondary | ICD-10-CM | POA: Diagnosis not present

## 2020-04-26 DIAGNOSIS — E1169 Type 2 diabetes mellitus with other specified complication: Secondary | ICD-10-CM | POA: Diagnosis not present

## 2020-04-26 DIAGNOSIS — I10 Essential (primary) hypertension: Secondary | ICD-10-CM | POA: Diagnosis not present

## 2020-04-26 DIAGNOSIS — I428 Other cardiomyopathies: Secondary | ICD-10-CM | POA: Diagnosis not present

## 2020-04-26 DIAGNOSIS — Z982 Presence of cerebrospinal fluid drainage device: Secondary | ICD-10-CM | POA: Diagnosis not present

## 2020-04-27 DIAGNOSIS — E11621 Type 2 diabetes mellitus with foot ulcer: Secondary | ICD-10-CM | POA: Diagnosis not present

## 2020-04-27 DIAGNOSIS — L97509 Non-pressure chronic ulcer of other part of unspecified foot with unspecified severity: Secondary | ICD-10-CM | POA: Diagnosis not present

## 2020-04-27 DIAGNOSIS — Z794 Long term (current) use of insulin: Secondary | ICD-10-CM | POA: Diagnosis not present

## 2020-04-27 IMAGING — DX DG CHEST 1V PORT
1 series · 1 of 1 positions shown · non-contrast
Comparison: July 18, 2018.

CLINICAL DATA: Dyspnea.

EXAM:
PORTABLE CHEST 1 VIEW

[chest]
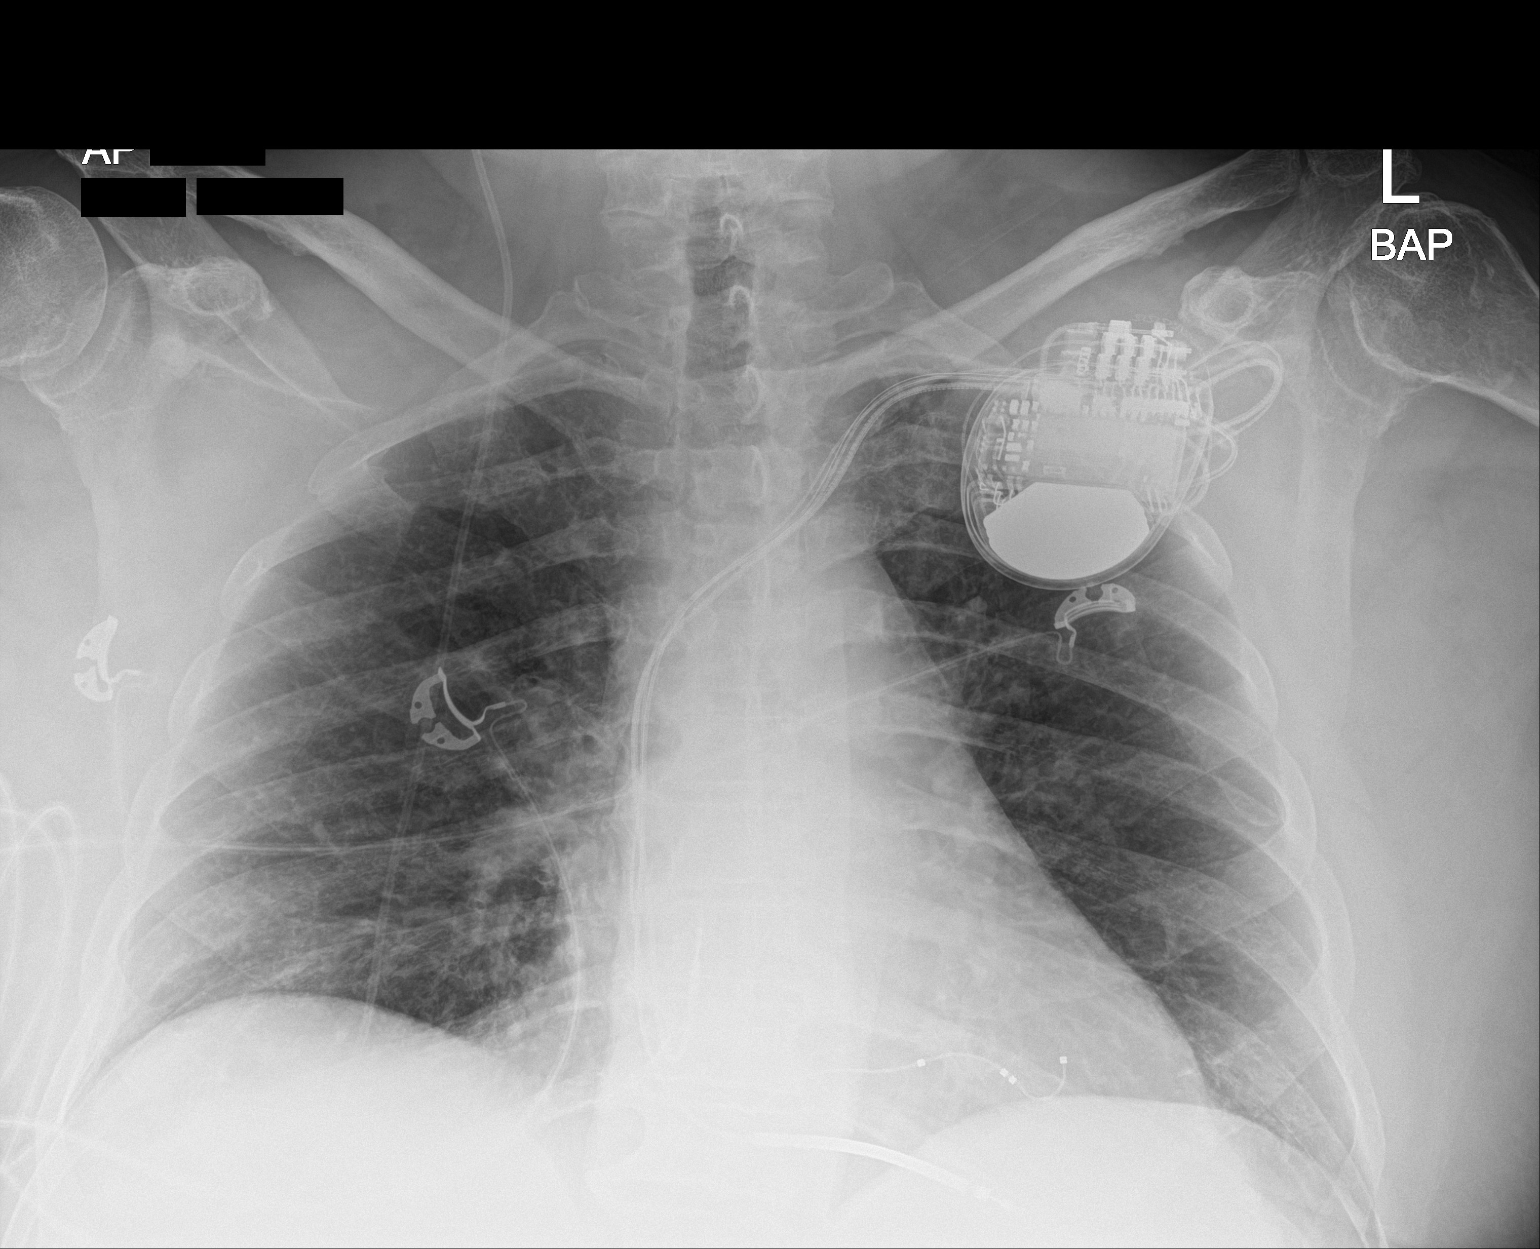

[1 of 1 positions shown; findings below may reference images not displayed]

FINDINGS: The heart size and mediastinal contours are within normal limits.
Both lungs are clear. No pneumothorax or pleural effusion is noted.
Right-sided ventriculoperitoneal shunt is again noted. Stable
left-sided pacemaker. The visualized skeletal structures are
unremarkable.
IMPRESSION: No active disease.

## 2020-04-30 ENCOUNTER — Ambulatory Visit (INDEPENDENT_AMBULATORY_CARE_PROVIDER_SITE_OTHER): Payer: Medicare Other

## 2020-04-30 DIAGNOSIS — I5022 Chronic systolic (congestive) heart failure: Secondary | ICD-10-CM | POA: Diagnosis not present

## 2020-04-30 DIAGNOSIS — Z95810 Presence of automatic (implantable) cardiac defibrillator: Secondary | ICD-10-CM | POA: Diagnosis not present

## 2020-05-02 DIAGNOSIS — L97529 Non-pressure chronic ulcer of other part of left foot with unspecified severity: Secondary | ICD-10-CM | POA: Diagnosis not present

## 2020-05-02 DIAGNOSIS — E11621 Type 2 diabetes mellitus with foot ulcer: Secondary | ICD-10-CM | POA: Diagnosis not present

## 2020-05-02 DIAGNOSIS — L97509 Non-pressure chronic ulcer of other part of unspecified foot with unspecified severity: Secondary | ICD-10-CM | POA: Diagnosis not present

## 2020-05-02 DIAGNOSIS — Z794 Long term (current) use of insulin: Secondary | ICD-10-CM | POA: Diagnosis not present

## 2020-05-02 NOTE — Progress Notes (Signed)
EPIC Encounter for ICM Monitoring  Patient Name: Justin Hill is a 79 y.o. male Date: 05/02/2020 Primary Care Physican: Serita Grammes, MD Primary Cardiologist:[facility] Electrophysiologist: Allred Bi-V Pacing: 99.3% 3/23/2021OfficeWeight:273lbs  Clinical Status -Since 03-May-20211  AT/AF                 72  Time in AT/AF    0.6 hr/day (2.6%) (taking Xarelto)  Longest AT/AF    96 minutes   Transmission reviewed.  Optivol thoracic impedancenormal.  Prescribed:  Torsemide20mg take2tablets (40 mg total)by mouth twice a day.  Metolazone 5 mg take as directed.  Labs: 02/20/2020 Creatinine 3.5,   BUN 16, Potassium 3.3, Sodium 133, GFR 17-21 02/01/2020 Creatinine2.06, BUN29, Potassium4.0, Sodium140, [id number] 01/31/2020 Creatinine2.26, BUN32, Potassium3.6, Sodium140, [id number]  01/30/2020 Creatinine2.83, BUN36, Potassium3.7, Sodium141, [id number]  03/07/2021Creatinine 2.57, BUN32, Potassium3.6, [id number], [id number]  A complete set of results can be found in Results Review.  Recommendations:None   Follow-up plan: ICM clinic phone appointment on7/10/2020. 91 day device clinic remote transmission6/29/2021.   Copy of ICM check sent to Dr.Allred.  3 month ICM trend: 04/30/2020    1 Year ICM trend:       Rosalene Billings, RN 05/02/2020 4:45 PM

## 2020-05-09 DIAGNOSIS — E11621 Type 2 diabetes mellitus with foot ulcer: Secondary | ICD-10-CM | POA: Diagnosis not present

## 2020-05-09 DIAGNOSIS — L97509 Non-pressure chronic ulcer of other part of unspecified foot with unspecified severity: Secondary | ICD-10-CM | POA: Diagnosis not present

## 2020-05-09 DIAGNOSIS — Z794 Long term (current) use of insulin: Secondary | ICD-10-CM | POA: Diagnosis not present

## 2020-05-09 DIAGNOSIS — Z87891 Personal history of nicotine dependence: Secondary | ICD-10-CM | POA: Diagnosis not present

## 2020-05-22 ENCOUNTER — Ambulatory Visit (INDEPENDENT_AMBULATORY_CARE_PROVIDER_SITE_OTHER): Payer: Medicare Other | Admitting: *Deleted

## 2020-05-22 DIAGNOSIS — I480 Paroxysmal atrial fibrillation: Secondary | ICD-10-CM

## 2020-05-22 DIAGNOSIS — I5031 Acute diastolic (congestive) heart failure: Secondary | ICD-10-CM

## 2020-05-22 LAB — CUP PACEART REMOTE DEVICE CHECK
Battery Remaining Longevity: 14 mo
Battery Voltage: 2.9 V
Brady Statistic AP VP Percent: 2.7 %
Brady Statistic AP VS Percent: 0.01 %
Brady Statistic AS VP Percent: 96 %
Brady Statistic AS VS Percent: 1.29 %
Brady Statistic RA Percent Paced: 2.63 %
Brady Statistic RV Percent Paced: 97.53 %
Date Time Interrogation Session: 20210629012303
HighPow Impedance: 94 Ohm
Implantable Lead Implant Date: 20170713
Implantable Lead Implant Date: 20170713
Implantable Lead Implant Date: 20170713
Implantable Lead Location: 753858
Implantable Lead Location: 753859
Implantable Lead Location: 753860
Implantable Lead Model: 4598
Implantable Lead Model: 5076
Implantable Pulse Generator Implant Date: 20170713
Lead Channel Impedance Value: 1026 Ohm
Lead Channel Impedance Value: 418 Ohm
Lead Channel Impedance Value: 456 Ohm
Lead Channel Impedance Value: 475 Ohm
Lead Channel Impedance Value: 513 Ohm
Lead Channel Impedance Value: 532 Ohm
Lead Channel Impedance Value: 532 Ohm
Lead Channel Impedance Value: 608 Ohm
Lead Channel Impedance Value: 665 Ohm
Lead Channel Impedance Value: 817 Ohm
Lead Channel Impedance Value: 874 Ohm
Lead Channel Impedance Value: 874 Ohm
Lead Channel Impedance Value: 874 Ohm
Lead Channel Pacing Threshold Amplitude: 0.375 V
Lead Channel Pacing Threshold Amplitude: 1 V
Lead Channel Pacing Threshold Amplitude: 4 V
Lead Channel Pacing Threshold Pulse Width: 0.4 ms
Lead Channel Pacing Threshold Pulse Width: 0.4 ms
Lead Channel Pacing Threshold Pulse Width: 0.8 ms
Lead Channel Sensing Intrinsic Amplitude: 0.25 mV
Lead Channel Sensing Intrinsic Amplitude: 0.25 mV
Lead Channel Sensing Intrinsic Amplitude: 5 mV
Lead Channel Sensing Intrinsic Amplitude: 5 mV
Lead Channel Setting Pacing Amplitude: 2 V
Lead Channel Setting Pacing Amplitude: 2.5 V
Lead Channel Setting Pacing Amplitude: 5 V
Lead Channel Setting Pacing Pulse Width: 0.4 ms
Lead Channel Setting Pacing Pulse Width: 0.8 ms
Lead Channel Setting Sensing Sensitivity: 0.3 mV

## 2020-05-24 NOTE — Progress Notes (Signed)
Remote ICD transmission.   

## 2020-06-04 ENCOUNTER — Encounter: Payer: Medicare Other | Admitting: Internal Medicine

## 2020-06-04 ENCOUNTER — Ambulatory Visit (INDEPENDENT_AMBULATORY_CARE_PROVIDER_SITE_OTHER): Payer: Medicare Other

## 2020-06-04 DIAGNOSIS — I5022 Chronic systolic (congestive) heart failure: Secondary | ICD-10-CM

## 2020-06-04 DIAGNOSIS — Z95810 Presence of automatic (implantable) cardiac defibrillator: Secondary | ICD-10-CM | POA: Diagnosis not present

## 2020-06-06 NOTE — Progress Notes (Signed)
EPIC Encounter for ICM Monitoring  Patient Name: Justin Hill is a 79 y.o. male Date: 06/06/2020 Primary Care Physican: Serita Grammes, MD Primary Cardiologist:[place] Electrophysiologist: Allred Bi-V Pacing: 99.9% 3/23/2021OfficeWeight:273lbs  Since 22-May-2020  AT/AF                6  Time in AT/AF   0.8 hr/day (3.2%)(taking Xarelto)  Longest AT/AF3 hours   Transmission reviewed.  Optivol thoracic impedancenormal.  Prescribed:  Torsemide20mg take2tablets (40 mg total)by mouth twice a day.  Metolazone 5 mg take as directed.  Labs: 02/20/2020 Creatinine 3.5, BUN 16, Potassium 3.3, Sodium 133, GFR 17-21 02/01/2020 Creatinine2.06, BUN29, Potassium4.0, Sodium140, OPF29-24 01/31/2020 Creatinine2.26, BUN32, Potassium3.6, Sodium140, [id number]  01/30/2020 Creatinine2.83, BUN36, Potassium3.7, Sodium141, TRR11-65  03/07/2021Creatinine 2.57, BUN32, Potassium3.6, [id number], VAN19-16  A complete set of results can be found in Results Review.  Recommendations:None  Follow-up plan: ICM clinic phone appointment on 07/09/2020.   91 day device clinic remote transmission 08/21/2020.    EP/Cardiology Office Visits: 06/27/2020 with Dr. Rayann Heman.    Copy of ICM check sent to Dr. Rayann Heman.   3 month ICM trend: 06/04/2020    1 Year ICM trend:       Rosalene Billings, RN 06/06/2020 5:14 PM

## 2020-06-19 DIAGNOSIS — Z6836 Body mass index (BMI) 36.0-36.9, adult: Secondary | ICD-10-CM | POA: Diagnosis not present

## 2020-06-19 DIAGNOSIS — F17211 Nicotine dependence, cigarettes, in remission: Secondary | ICD-10-CM | POA: Diagnosis not present

## 2020-06-19 DIAGNOSIS — G4733 Obstructive sleep apnea (adult) (pediatric): Secondary | ICD-10-CM | POA: Diagnosis not present

## 2020-06-20 DIAGNOSIS — Z794 Long term (current) use of insulin: Secondary | ICD-10-CM | POA: Diagnosis not present

## 2020-06-20 DIAGNOSIS — E11621 Type 2 diabetes mellitus with foot ulcer: Secondary | ICD-10-CM | POA: Diagnosis not present

## 2020-06-20 DIAGNOSIS — Z87891 Personal history of nicotine dependence: Secondary | ICD-10-CM | POA: Diagnosis not present

## 2020-06-20 DIAGNOSIS — L97509 Non-pressure chronic ulcer of other part of unspecified foot with unspecified severity: Secondary | ICD-10-CM | POA: Diagnosis not present

## 2020-06-27 ENCOUNTER — Encounter: Payer: Self-pay | Admitting: Internal Medicine

## 2020-06-27 ENCOUNTER — Other Ambulatory Visit: Payer: Self-pay

## 2020-06-27 ENCOUNTER — Telehealth (INDEPENDENT_AMBULATORY_CARE_PROVIDER_SITE_OTHER): Payer: Medicare Other | Admitting: Internal Medicine

## 2020-06-27 VITALS — Ht 71.0 in | Wt 253.0 lb

## 2020-06-27 DIAGNOSIS — Z9989 Dependence on other enabling machines and devices: Secondary | ICD-10-CM

## 2020-06-27 DIAGNOSIS — G4733 Obstructive sleep apnea (adult) (pediatric): Secondary | ICD-10-CM

## 2020-06-27 DIAGNOSIS — I480 Paroxysmal atrial fibrillation: Secondary | ICD-10-CM | POA: Diagnosis not present

## 2020-06-27 DIAGNOSIS — I5022 Chronic systolic (congestive) heart failure: Secondary | ICD-10-CM | POA: Diagnosis not present

## 2020-06-27 DIAGNOSIS — I428 Other cardiomyopathies: Secondary | ICD-10-CM

## 2020-06-27 NOTE — Progress Notes (Signed)
Electrophysiology TeleHealth Note   Due to national recommendations of social distancing due to COVID 19, an audio/video telehealth visit is felt to be most appropriate for this patient at this time.  See MyChart message from today for the patient's consent to telehealth for [facility].  Date:  06/27/2020   ID:  Justin Hill, DOB 06-03-1941, MRN [id number]  Location: patient's home  Provider location:  [place]  Evaluation Performed: Follow-up visit  PCP:  Serita Grammes, MD   Electrophysiologist:  Dr Rayann Heman  Chief Complaint:  palpitations  History of Present Illness:    Justin Hill is a 79 y.o. male who presents via telehealth conferencing today.  Since last being seen in our clinic, the patient reports doing very well.  He now lives with his son since his wife died.  Today, he denies symptoms of palpitations, chest pain, shortness of breath,  lower extremity edema, dizziness, presyncope, or syncope.  The patient is otherwise without complaint today.   Past Medical History:  Diagnosis Date  . Altered mental status   . Cardiomyopathy   . CHF (congestive heart failure) ([place])   . Chronic kidney disease (CKD), stage III (moderate)   . Communicating hydrocephalus ([place])   . Diabetes mellitus    type 2  . Diverticulosis   . History of kidney stones   . Hypercholesterolemia   . Hyperglycemia   . Hypertension   . Paroxysmal atrial fibrillation ([facility])   . Personal history of kidney stones   . Pneumonia   . Sleep apnea    uses cpap    Past Surgical History:  Procedure Laterality Date  . ATRIAL FLUTTER ABLATION N/A 01/19/2015   Procedure: ATRIAL FLUTTER ABLATION;  Surgeon: [name], MD;  Location: [facility] CATH LAB;  Service: Cardiovascular;  Laterality: N/A;  . CARDIAC CATHETERIZATION N/A 05/02/2016   Procedure: Right/Left Heart Cath and Coronary Angiography;  Surgeon: Larey Dresser, MD;  Location: [facility] CV LAB;  Service: Cardiovascular;   Laterality: N/A;  . COLONOSCOPY N/A 12/29/2013   Procedure: COLONOSCOPY;  Surgeon: Lafayette Dragon, MD;  Location: [facility] ENDOSCOPY;  Service: Endoscopy;  Laterality: N/A;  . EP IMPLANTABLE DEVICE N/A 06/05/2016   MDT Hillery Aldo XT CRTD implanted by Dr Rayann Heman   . EYE SURGERY     cataract surgery (not sure which eye)  . RIGHT HEART CATH N/A 01/26/2020   Procedure: RIGHT HEART CATH;  Surgeon: Larey Dresser, MD;  Location: [place] CV LAB;  Service: Cardiovascular;  Laterality: N/A;  . [place]   left. Muscle reattachment  . TOE SURGERY Right 12/2012   2nd toe  . TONSILLECTOMY    . VENTRICULOPERITONEAL SHUNT Right 11/20/2017   Procedure: SHUNT PLACEMENT RIGHT;  Surgeon: Newman Pies, MD;  Location: [facility];  Service: Neurosurgery;  Laterality: Right;  SHUNT PLACEMENT RIGHT    Current Outpatient Medications  Medication Sig Dispense Refill  . acetaminophen (TYLENOL) 500 MG tablet Take 500-1,000 mg by mouth every 6 (six) hours as needed for mild pain or headache.     . Ascorbic Acid (VITAMIN C) 1000 MG tablet Take 1 tablet by mouth daily.     . carvedilol (COREG) 25 MG tablet Take 25 mg by mouth daily.    . cholecalciferol (VITAMIN D3) 25 MCG (1000 UNIT) tablet Take 1,000 Units by mouth daily.    . Cinnamon 500 MG TABS Take 1 tablet (500 mg total) by mouth daily.    Marland Kitchen  fenofibrate 54 MG tablet Take 1 tablet by mouth once daily 90 tablet 0  . insulin NPH-regular Human (70-30) 100 UNIT/ML injection 140 units with breakfast, and 30 units with supper, and syringes 2/day 100 mL 11  . loratadine (CLARITIN) 10 MG tablet Take 20 mg by mouth daily.     . metolazone (ZAROXOLYN) 5 MG tablet Take 5 mg by mouth as directed.    . Multiple Vitamin (MULTIVITAMIN) tablet Take 1 tablet by mouth daily.      Marland Kitchen omeprazole (PRILOSEC) 40 MG capsule Take 40 mg by mouth daily. OTC    . Rivaroxaban (XARELTO) 15 MG TABS tablet Take 1 tablet (15 mg total) by mouth daily with supper. 30 tablet 6  . senna  (SENOKOT) 8.6 MG tablet Take 1 tablet by mouth daily.    . simvastatin (ZOCOR) 40 MG tablet Take 1 tablet (40 mg total) by mouth at bedtime. 90 tablet 3  . tamsulosin (FLOMAX) 0.4 MG CAPS capsule Take 0.4 mg by mouth daily.    Marland Kitchen torsemide (DEMADEX) 20 MG tablet Take 20 mg by mouth as needed (fluid).    Marland Kitchen umeclidinium-vilanterol (ANORO ELLIPTA) 62.5-25 MCG/INH AEPB Inhale 1 puff into the lungs daily.     No current facility-administered medications for this visit.    Allergies:   Penicillins, Aspirin, and Lisinopril   Social History:  The patient  reports that he has quit smoking. His smoking use included cigarettes. He has never used smokeless tobacco. He reports that he does not drink alcohol and does not use drugs.   ROS:  Please see the history of present illness.   All other systems are personally reviewed and negative.    Exam:    Vital Signs:  Ht 5\' 11"  (1.803 m)   Wt 253 lb (114.8 kg)   BMI 35.29 kg/m   Well sounding and appearing, alert and conversant, regular work of breathing,  good skin color Eyes- anicteric, neuro- grossly intact, skin- no apparent rash or lesions or cyanosis, mouth- oral mucosa is pink  Labs/Other Tests and Data Reviewed:    Recent Labs: 01/24/2020: B Natriuretic Peptide 35.1 01/25/2020: TSH 2.037 01/31/2020: ALT 16 02/01/2020: BUN 29; Creatinine, Ser 2.06; Hemoglobin 10.7; Magnesium 2.2; Platelets 116; Potassium 4.0; Sodium 140   Wt Readings from Last 3 Encounters:  06/27/20 253 lb (114.8 kg)  02/14/20 273 lb (123.8 kg)  02/01/20 271 lb 14.4 oz (123.3 kg)     Last device remote is reviewed from [place] PDF which reveals normal device function,afib burden is 4%   ASSESSMENT & PLAN:    1.  Chronic systolic dysfunction EF has normalized with CRT LV lead output is elevated chronically Continue remote monitoring Follows in ICM device clinic Normal BiV ICD function by remotes (reviewed0  2. Paroxysmal atrial fibrillation afib burden is 4 % (0.5%  a year ago) Continue xarelto  3. OSA Compliance with CPAP is advised  4. Obesity Regular exercise and weight loss is advised He now lives with his son who is working to help him improve diet and activity levels   Risks, benefits and potential toxicities for medications prescribed and/or refilled reviewed with patient today.   Follow-up:  Return to see me in a year  Patient Risk:  after full review of this patients clinical status, I feel that they are at moderate risk at this time.  Today, I have spent 15 minutes with the patient with telehealth technology discussing arrhythmia management .    Signed, [name],  MD  06/27/2020 3:52 PM     [place] [address] [phone] (office) [phone] (fax)

## 2020-07-04 DIAGNOSIS — Z794 Long term (current) use of insulin: Secondary | ICD-10-CM | POA: Diagnosis not present

## 2020-07-04 DIAGNOSIS — E11621 Type 2 diabetes mellitus with foot ulcer: Secondary | ICD-10-CM | POA: Diagnosis not present

## 2020-07-04 DIAGNOSIS — L97509 Non-pressure chronic ulcer of other part of unspecified foot with unspecified severity: Secondary | ICD-10-CM | POA: Diagnosis not present

## 2020-07-04 DIAGNOSIS — L97429 Non-pressure chronic ulcer of left heel and midfoot with unspecified severity: Secondary | ICD-10-CM | POA: Diagnosis not present

## 2020-07-06 DIAGNOSIS — I428 Other cardiomyopathies: Secondary | ICD-10-CM | POA: Diagnosis not present

## 2020-07-09 ENCOUNTER — Ambulatory Visit (INDEPENDENT_AMBULATORY_CARE_PROVIDER_SITE_OTHER): Payer: Medicare Other

## 2020-07-09 DIAGNOSIS — Z95810 Presence of automatic (implantable) cardiac defibrillator: Secondary | ICD-10-CM

## 2020-07-09 DIAGNOSIS — I5022 Chronic systolic (congestive) heart failure: Secondary | ICD-10-CM | POA: Diagnosis not present

## 2020-07-11 NOTE — Progress Notes (Signed)
EPIC Encounter for ICM Monitoring  Patient Name: Justin Hill is a 79 y.o. male Date: 07/11/2020 Primary Care Physican: Serita Grammes, MD Primary Cardiologist:[place] Electrophysiologist: Allred Bi-V Pacing: 100% 3/23/2021OfficeWeight:273lbs   Clinical Status (06-Jul-2020 to 09-Jul-2020)  Treated VT/VF 0 episodes   AT/AF 16 episodes   Time in AT/AF 0.4 hr/day (1.6%)   Transmission reviewed.  Optivol thoracic impedancenormal.  Prescribed:  Torsemide20mg take2tablets (40 mg total)by mouth twice a day.  Metolazone 5 mg take as directed.  Labs: 02/20/2020 Creatinine 3.5, BUN 16, Potassium 3.3, Sodium 133, GFR 17-21 02/01/2020 Creatinine2.06, BUN29, Potassium4.0, Sodium140, [id number] 01/31/2020 Creatinine2.26, BUN32, Potassium3.6, Sodium140, OTR71-16  01/30/2020 Creatinine2.83, BUN36, Potassium3.7, Sodium141, FBX03-83  03/07/2021Creatinine 2.57, BUN32, Potassium3.6, FXOVAN191, YOM60-04  A complete set of results can be found in Results Review.  Recommendations:None  Follow-up plan: ICM clinic phone appointment on 08/13/2020.   91 day device clinic remote transmission 08/21/2020.    Copy of ICM check sent to Dr. Rayann Heman.   3 month ICM trend: 07/09/2020    1 Year ICM trend:       Rosalene Billings, RN 07/11/2020 3:50 PM

## 2020-07-13 ENCOUNTER — Telehealth: Payer: Self-pay

## 2020-07-13 NOTE — Telephone Encounter (Signed)
I spoke with the pt son and he do want the pt to be release in [facility].

## 2020-07-16 ENCOUNTER — Telehealth: Payer: Self-pay | Admitting: Adult Health

## 2020-07-16 NOTE — Telephone Encounter (Signed)
Patients son called and is requesting his father, Justin Hill, medical records be sent to [facility], Dr. Concha Pyo. Patient is now in care of the son in [place], [place] and has an appointment with [facility] on Tuesday 07/24/2020. Nikolaj Geraghty (Son) is best reached at [phone].

## 2020-07-18 DIAGNOSIS — L97509 Non-pressure chronic ulcer of other part of unspecified foot with unspecified severity: Secondary | ICD-10-CM | POA: Diagnosis not present

## 2020-07-18 DIAGNOSIS — Z794 Long term (current) use of insulin: Secondary | ICD-10-CM | POA: Diagnosis not present

## 2020-07-18 DIAGNOSIS — E1140 Type 2 diabetes mellitus with diabetic neuropathy, unspecified: Secondary | ICD-10-CM | POA: Diagnosis not present

## 2020-07-18 DIAGNOSIS — Z87891 Personal history of nicotine dependence: Secondary | ICD-10-CM | POA: Diagnosis not present

## 2020-07-18 DIAGNOSIS — E11621 Type 2 diabetes mellitus with foot ulcer: Secondary | ICD-10-CM | POA: Diagnosis not present

## 2020-07-19 DIAGNOSIS — N189 Chronic kidney disease, unspecified: Secondary | ICD-10-CM | POA: Diagnosis not present

## 2020-07-24 DIAGNOSIS — E1142 Type 2 diabetes mellitus with diabetic polyneuropathy: Secondary | ICD-10-CM | POA: Diagnosis not present

## 2020-07-24 DIAGNOSIS — R4189 Other symptoms and signs involving cognitive functions and awareness: Secondary | ICD-10-CM | POA: Diagnosis not present

## 2020-07-24 DIAGNOSIS — Z982 Presence of cerebrospinal fluid drainage device: Secondary | ICD-10-CM | POA: Diagnosis not present

## 2020-07-24 DIAGNOSIS — Z95810 Presence of automatic (implantable) cardiac defibrillator: Secondary | ICD-10-CM | POA: Diagnosis not present

## 2020-07-25 DIAGNOSIS — J069 Acute upper respiratory infection, unspecified: Secondary | ICD-10-CM | POA: Diagnosis not present

## 2020-08-01 DIAGNOSIS — E119 Type 2 diabetes mellitus without complications: Secondary | ICD-10-CM | POA: Diagnosis not present

## 2020-08-01 DIAGNOSIS — I429 Cardiomyopathy, unspecified: Secondary | ICD-10-CM | POA: Diagnosis not present

## 2020-08-01 DIAGNOSIS — E1159 Type 2 diabetes mellitus with other circulatory complications: Secondary | ICD-10-CM | POA: Diagnosis not present

## 2020-08-01 DIAGNOSIS — E11621 Type 2 diabetes mellitus with foot ulcer: Secondary | ICD-10-CM | POA: Diagnosis not present

## 2020-08-01 DIAGNOSIS — E785 Hyperlipidemia, unspecified: Secondary | ICD-10-CM | POA: Diagnosis not present

## 2020-08-01 DIAGNOSIS — Z6836 Body mass index (BMI) 36.0-36.9, adult: Secondary | ICD-10-CM | POA: Diagnosis not present

## 2020-08-01 DIAGNOSIS — Z794 Long term (current) use of insulin: Secondary | ICD-10-CM | POA: Diagnosis not present

## 2020-08-14 DIAGNOSIS — I428 Other cardiomyopathies: Secondary | ICD-10-CM | POA: Diagnosis not present

## 2020-08-23 DIAGNOSIS — N184 Chronic kidney disease, stage 4 (severe): Secondary | ICD-10-CM | POA: Diagnosis not present

## 2020-08-23 DIAGNOSIS — D631 Anemia in chronic kidney disease: Secondary | ICD-10-CM | POA: Diagnosis not present

## 2020-08-23 DIAGNOSIS — I129 Hypertensive chronic kidney disease with stage 1 through stage 4 chronic kidney disease, or unspecified chronic kidney disease: Secondary | ICD-10-CM | POA: Diagnosis not present

## 2020-08-23 DIAGNOSIS — E1122 Type 2 diabetes mellitus with diabetic chronic kidney disease: Secondary | ICD-10-CM | POA: Diagnosis not present

## 2020-09-01 DIAGNOSIS — Z23 Encounter for immunization: Secondary | ICD-10-CM | POA: Diagnosis not present

## 2020-09-20 DIAGNOSIS — E1142 Type 2 diabetes mellitus with diabetic polyneuropathy: Secondary | ICD-10-CM | POA: Diagnosis not present

## 2020-09-20 DIAGNOSIS — B351 Tinea unguium: Secondary | ICD-10-CM | POA: Diagnosis not present

## 2020-09-20 DIAGNOSIS — L84 Corns and callosities: Secondary | ICD-10-CM | POA: Diagnosis not present

## 2020-10-11 DIAGNOSIS — D23122 Other benign neoplasm of skin of left lower eyelid, including canthus: Secondary | ICD-10-CM | POA: Diagnosis not present

## 2020-10-11 DIAGNOSIS — L821 Other seborrheic keratosis: Secondary | ICD-10-CM | POA: Diagnosis not present

## 2020-10-11 DIAGNOSIS — L578 Other skin changes due to chronic exposure to nonionizing radiation: Secondary | ICD-10-CM | POA: Diagnosis not present

## 2020-10-16 DIAGNOSIS — L97509 Non-pressure chronic ulcer of other part of unspecified foot with unspecified severity: Secondary | ICD-10-CM | POA: Diagnosis not present

## 2020-10-16 DIAGNOSIS — Z794 Long term (current) use of insulin: Secondary | ICD-10-CM | POA: Diagnosis not present

## 2020-10-16 DIAGNOSIS — E11621 Type 2 diabetes mellitus with foot ulcer: Secondary | ICD-10-CM | POA: Diagnosis not present

## 2020-10-22 ENCOUNTER — Ambulatory Visit: Payer: Medicare Other | Admitting: Adult Health

## 2020-11-02 DIAGNOSIS — F17211 Nicotine dependence, cigarettes, in remission: Secondary | ICD-10-CM | POA: Diagnosis not present

## 2020-11-02 DIAGNOSIS — Z6836 Body mass index (BMI) 36.0-36.9, adult: Secondary | ICD-10-CM | POA: Diagnosis not present

## 2020-11-02 DIAGNOSIS — G4733 Obstructive sleep apnea (adult) (pediatric): Secondary | ICD-10-CM | POA: Diagnosis not present

## 2020-11-02 DIAGNOSIS — J449 Chronic obstructive pulmonary disease, unspecified: Secondary | ICD-10-CM | POA: Diagnosis not present

## 2020-11-11 DIAGNOSIS — Z23 Encounter for immunization: Secondary | ICD-10-CM | POA: Diagnosis not present

## 2022-09-24 DEATH — deceased
